# Patient Record
Sex: Male | Born: 1975
Health system: Southern US, Community
[De-identification: ages and names within clinical notes are randomized; demographics above are authoritative.]

## PROBLEM LIST (undated history)

## (undated) ENCOUNTER — Ambulatory Visit: Payer: Self-pay | Source: Home / Self Care

## (undated) DIAGNOSIS — R079 Chest pain, unspecified: Secondary | ICD-10-CM

## (undated) DIAGNOSIS — E785 Hyperlipidemia, unspecified: Secondary | ICD-10-CM

## (undated) DIAGNOSIS — G629 Polyneuropathy, unspecified: Secondary | ICD-10-CM

## (undated) DIAGNOSIS — N471 Phimosis: Secondary | ICD-10-CM

## (undated) DIAGNOSIS — E119 Type 2 diabetes mellitus without complications: Secondary | ICD-10-CM

## (undated) DIAGNOSIS — N492 Inflammatory disorders of scrotum: Secondary | ICD-10-CM

## (undated) DIAGNOSIS — G4733 Obstructive sleep apnea (adult) (pediatric): Secondary | ICD-10-CM

## (undated) DIAGNOSIS — N489 Disorder of penis, unspecified: Secondary | ICD-10-CM

## (undated) DIAGNOSIS — I10 Essential (primary) hypertension: Secondary | ICD-10-CM

## (undated) HISTORY — PX: NO PAST SURGERIES: SHX2092

## (undated) HISTORY — DX: Essential (primary) hypertension: I10

## (undated) HISTORY — DX: Chest pain, unspecified: R07.9

## (undated) HISTORY — DX: Inflammatory disorders of scrotum: N49.2

---

## 2009-07-26 ENCOUNTER — Emergency Department (HOSPITAL_COMMUNITY): Admission: EM | Admit: 2009-07-26 | Discharge: 2009-07-26 | Payer: Self-pay | Admitting: Emergency Medicine

## 2010-04-13 LAB — CBC
HCT: 44.7 % (ref 39.0–52.0)
MCH: 30.7 pg (ref 26.0–34.0)
MCV: 89.1 fL (ref 78.0–100.0)
RDW: 13 % (ref 11.5–15.5)

## 2010-04-13 LAB — POCT I-STAT, CHEM 8
BUN: 15 mg/dL (ref 6–23)
Calcium, Ion: 1.15 mmol/L (ref 1.12–1.32)
Chloride: 101 mEq/L (ref 96–112)
Creatinine, Ser: 0.9 mg/dL (ref 0.4–1.5)
Glucose, Bld: 289 mg/dL — ABNORMAL HIGH (ref 70–99)
HCT: 49 % (ref 39.0–52.0)
TCO2: 29 mmol/L (ref 0–100)

## 2010-04-13 LAB — DIFFERENTIAL
Basophils Relative: 1 % (ref 0–1)
Neutrophils Relative %: 69 % (ref 43–77)

## 2013-09-12 ENCOUNTER — Emergency Department (HOSPITAL_COMMUNITY): Payer: Self-pay

## 2013-09-12 ENCOUNTER — Emergency Department (HOSPITAL_COMMUNITY)
Admission: EM | Admit: 2013-09-12 | Discharge: 2013-09-12 | Disposition: A | Discharge status: Home / Self Care | Payer: Self-pay | Attending: Emergency Medicine | Admitting: Emergency Medicine

## 2013-09-12 ENCOUNTER — Other Ambulatory Visit: Payer: Self-pay | Admitting: Nurse Practitioner

## 2013-09-12 ENCOUNTER — Encounter (HOSPITAL_COMMUNITY): Payer: Self-pay | Admitting: Emergency Medicine

## 2013-09-12 DIAGNOSIS — E119 Type 2 diabetes mellitus without complications: Secondary | ICD-10-CM | POA: Insufficient documentation

## 2013-09-12 DIAGNOSIS — R0789 Other chest pain: Secondary | ICD-10-CM

## 2013-09-12 DIAGNOSIS — R739 Hyperglycemia, unspecified: Secondary | ICD-10-CM

## 2013-09-12 DIAGNOSIS — R110 Nausea: Secondary | ICD-10-CM | POA: Insufficient documentation

## 2013-09-12 DIAGNOSIS — R079 Chest pain, unspecified: Secondary | ICD-10-CM | POA: Insufficient documentation

## 2013-09-12 DIAGNOSIS — F17200 Nicotine dependence, unspecified, uncomplicated: Secondary | ICD-10-CM | POA: Insufficient documentation

## 2013-09-12 LAB — BASIC METABOLIC PANEL
ANION GAP: 14 (ref 5–15)
BUN: 12 mg/dL (ref 6–23)
CALCIUM: 9.3 mg/dL (ref 8.4–10.5)
CHLORIDE: 97 meq/L (ref 96–112)
CO2: 24 meq/L (ref 19–32)
CREATININE: 0.63 mg/dL (ref 0.50–1.35)
GFR calc Af Amer: >90 mL/min (ref >90)
Glucose, Bld: 403 mg/dL — ABNORMAL HIGH (ref 70–99)
Potassium: 4.3 mEq/L (ref 3.7–5.3)
Sodium: 135 mEq/L — ABNORMAL LOW (ref 137–147)

## 2013-09-12 LAB — CBC
HCT: 44.7 % (ref 39.0–52.0)
Hemoglobin: 16.2 g/dL (ref 13.0–17.0)
MCH: 30.9 pg (ref 26.0–34.0)
MCHC: 36.2 g/dL — ABNORMAL HIGH (ref 30.0–36.0)
MCV: 85.3 fL (ref 78.0–100.0)
Platelets: 236 10*3/uL (ref 150–400)
RBC: 5.24 MIL/uL (ref 4.22–5.81)
RDW: 12.5 % (ref 11.5–15.5)
WBC: 12.5 10*3/uL — ABNORMAL HIGH (ref 4.0–10.5)

## 2013-09-12 LAB — I-STAT TROPONIN, ED: Troponin i, poc: 0 ng/mL (ref 0.00–0.08)

## 2013-09-12 LAB — CBG MONITORING, ED: GLUCOSE-CAPILLARY: 251 mg/dL — AB (ref 70–99)

## 2013-09-12 MED ORDER — SODIUM CHLORIDE 0.9 % IV BOLUS (SEPSIS)
1000.0000 mL | Freq: Once | INTRAVENOUS | Status: AC
  Administered 2013-09-12: 1000 mL via INTRAVENOUS

## 2013-09-12 NOTE — ED Provider Notes (Signed)
CSN: [id number]     Arrival date & time 09/12/13  1616 History   First MD Initiated Contact with Patient 09/12/13 1906     Chief Complaint  Patient presents with  . Chest Pain     (Consider location/radiation/quality/duration/timing/severity/associated sxs/prior Treatment) Patient is a 38 y.o. male presenting with chest pain. The history is provided by the patient and the spouse. No language interpreter was used.  Chest Pain Pain location:  L chest Pain quality: aching   Pain radiates to:  Mid back and L shoulder Pain radiates to the back: yes   Pain severity:  Moderate Onset quality:  Sudden Timing:  Intermittent Progression:  Waxing and waning Chronicity:  Recurrent (3 months, nearly daily) Context: at rest   Relieved by:  Rest Exacerbated by: anxiety. Ineffective treatments:  None tried Associated symptoms: nausea   Associated symptoms: no abdominal pain, no back pain, no cough, no diaphoresis, no dizziness, no dysphagia, no fatigue, no fever, no headache, no numbness, no shortness of breath, no syncope, not vomiting and no weakness   Risk factors: diabetes mellitus and male sex   Risk factors: no high cholesterol, no hypertension, not obese, no prior DVT/PE and no smoking     Past Medical History  Diagnosis Date  . Diabetes mellitus without complication    History reviewed. No pertinent past surgical history. No family history on file. History  Substance Use Topics  . Smoking status: Current Some Day Smoker  . Smokeless tobacco: Not on file  . Alcohol Use: No    Review of Systems  Constitutional: Negative for fever, diaphoresis, activity change, appetite change and fatigue.  HENT: Negative for congestion, facial swelling, rhinorrhea and trouble swallowing.   Eyes: Negative for photophobia and pain.  Respiratory: Negative for cough, chest tightness and shortness of breath.   Cardiovascular: Positive for chest pain. Negative for leg swelling and syncope.   Gastrointestinal: Positive for nausea. Negative for vomiting, abdominal pain, diarrhea and constipation.  Endocrine: Negative for polydipsia and polyuria.  Genitourinary: Negative for dysuria, urgency, decreased urine volume and difficulty urinating.  Musculoskeletal: Negative for back pain and gait problem.  Skin: Negative for color change, rash and wound.  Allergic/Immunologic: Negative for immunocompromised state.  Neurological: Negative for dizziness, facial asymmetry, speech difficulty, weakness, numbness and headaches.  Psychiatric/Behavioral: Negative for confusion, decreased concentration and agitation.      Allergies  Augmentin  Home Medications   Prior to Admission medications   Not on File   BP 122/77  Pulse 72  Temp(Src) 98.1 F (36.7 C) (Oral)  Resp 13  SpO2 97% Physical Exam  Constitutional: He is oriented to person, place, and time. He appears well-developed and well-nourished. No distress.  HENT:  Head: Normocephalic and atraumatic.  Mouth/Throat: No oropharyngeal exudate.  Eyes: Pupils are equal, round, and reactive to light.  Neck: Normal range of motion. Neck supple.  Cardiovascular: Normal rate, regular rhythm and normal heart sounds.  Exam reveals no gallop and no friction rub.   No murmur heard. Pulmonary/Chest: Effort normal and breath sounds normal. No respiratory distress. He has no wheezes. He has no rales.  Abdominal: Soft. Bowel sounds are normal. He exhibits no distension and no mass. There is no tenderness. There is no rebound and no guarding.  Musculoskeletal: Normal range of motion. He exhibits no edema and no tenderness.  Neurological: He is alert and oriented to person, place, and time.  Skin: Skin is warm and dry.  Psychiatric: He has a normal mood  and affect.    ED Course  Procedures (including critical care time) Labs Review Labs Reviewed  CBC - Abnormal; Notable for the following:    WBC 12.5 (*)    MCHC 36.2 (*)    All other  components within normal limits  BASIC METABOLIC PANEL - Abnormal; Notable for the following:    Sodium 135 (*)    Glucose, Bld 403 (*)    All other components within normal limits  CBG MONITORING, ED - Abnormal; Notable for the following:    Glucose-Capillary 251 (*)    All other components within normal limits  I-STAT TROPOININ, ED    Imaging Review Dg Chest 2 View  09/12/2013   CLINICAL DATA:  Left-sided chest pain.  EXAM: CHEST  2 VIEW  COMPARISON:  07/26/2009  FINDINGS: The heart size and mediastinal contours are within normal limits. Both lungs are clear. Accentuation of the thoracic kyphosis, stable. No effusions.  IMPRESSION: No acute abnormalities.   Electronically Signed   By: Rozetta Nunnery M.D.   On: 09/12/2013 17:39     EKG Interpretation   Date/Time:  Tuesday September 12 2013 16:20:27 EDT Ventricular Rate:  79 PR Interval:  172 QRS Duration: 92 QT Interval:  382 QTC Calculation: 438 R Axis:   27 Text Interpretation:  Normal sinus rhythm No significant change since last  tracing Confirmed by [place] ([id number]) on 09/12/2013 7:09:02 PM      MDM   Final diagnoses:  Atypical chest pain  Hyperglycemia    Pt is a 38 y.o. male with Pmhx as above who presents with intermittent CP for 3 months w/ radiation to L shoulder and back. No current CP. CP brought on by stress. Episodes last mins. Today, he had episode w/ assoc clammy skin. He has not taken DM meds for 3 years due to side effects. EKG w/o ischemic changes, CXR nml, trop neg. Glu 400, AG 14. Doubt ACS given symptoms occur at rest, lasting mins, though given sex, hx of uncontrolled DM, spoke w/ cards to schedule outpt stress testing. SW also consulted and pt will be set up for appt at [facility] later this week or early next week. Glu improved to 251 after 1L NS. Instructions given for resuming diabetic diet. I have stressed the importance of resuming control of his DM.         Ernestina Patches, MD 09/13/13 1155

## 2013-09-12 NOTE — Discharge Instructions (Signed)
Diabetes Mellitus and Food °It is important for you to manage your blood sugar (glucose) level. Your blood glucose level can be greatly affected by what you eat. Eating healthier foods in the appropriate amounts throughout the day at about the same time each day will help you control your blood glucose level. It can also help slow or prevent worsening of your diabetes mellitus. Healthy eating may even help you improve the level of your blood pressure and reach or maintain a healthy weight.  °HOW CAN FOOD AFFECT ME? °Carbohydrates °Carbohydrates affect your blood glucose level more than any other type of food. Your dietitian will help you determine how many carbohydrates to eat at each meal and teach you how to count carbohydrates. Counting carbohydrates is important to keep your blood glucose at a healthy level, especially if you are using insulin or taking certain medicines for diabetes mellitus. °Alcohol °Alcohol can cause sudden decreases in blood glucose (hypoglycemia), especially if you use insulin or take certain medicines for diabetes mellitus. Hypoglycemia can be a life-threatening condition. Symptoms of hypoglycemia (sleepiness, dizziness, and disorientation) are similar to symptoms of having too much alcohol.  °If your health care provider has given you approval to drink alcohol, do so in moderation and use the following guidelines: °· Women should not have more than one drink per day, and men should not have more than two drinks per day. One drink is equal to: °¨ 12 oz of beer. °¨ 5 oz of wine. °¨ 1½ oz of hard liquor. °· Do not drink on an empty stomach. °· Keep yourself hydrated. Have water, diet soda, or unsweetened iced tea. °· Regular soda, juice, and other mixers might contain a lot of carbohydrates and should be counted. °WHAT FOODS ARE NOT RECOMMENDED? °As you make food choices, it is important to remember that all foods are not the same. Some foods have fewer nutrients per serving than other  foods, even though they might have the same number of calories or carbohydrates. It is difficult to get your body what it needs when you eat foods with fewer nutrients. Examples of foods that you should avoid that are high in calories and carbohydrates but low in nutrients include: °· Trans fats (most processed foods list trans fats on the Nutrition Facts label). °· Regular soda. °· Juice. °· Candy. °· Sweets, such as cake, pie, doughnuts, and cookies. °· Fried foods. °WHAT FOODS CAN I EAT? °Have nutrient-rich foods, which will nourish your body and keep you healthy. The food you should eat also will depend on several factors, including: °· The calories you need. °· The medicines you take. °· Your weight. °· Your blood glucose level. °· Your blood pressure level. °· Your cholesterol level. °You also should eat a variety of foods, including: °· Protein, such as meat, poultry, fish, tofu, nuts, and seeds (lean animal proteins are best). °· Fruits. °· Vegetables. °· Dairy products, such as milk, cheese, and yogurt (low fat is best). °· Breads, grains, pasta, cereal, rice, and beans. °· Fats such as olive oil, trans fat-free margarine, canola oil, avocado, and olives. °DOES EVERYONE WITH DIABETES MELLITUS HAVE THE SAME MEAL PLAN? °Because every person with diabetes mellitus is different, there is not one meal plan that works for everyone. It is very important that you meet with a dietitian who will help you create a meal plan that is just right for you. °Document Released: 10/09/2004 Document Revised: 01/17/2013 Document Reviewed: 12/09/2012 °[employer]® Patient Information ©2015 [employer]. This   information is not intended to replace advice given to you by your health care provider. Make sure you discuss any questions you have with your health care provider.   Chest Pain (Nonspecific) It is often hard to give a specific diagnosis for the cause of chest pain. There is always a chance that your pain could be related  to something serious, such as a heart attack or a blood clot in the lungs. You need to follow up with your health care provider for further evaluation. CAUSES   Heartburn.  Pneumonia or bronchitis.  Anxiety or stress.  Inflammation around your heart (pericarditis) or lung (pleuritis or pleurisy).  A blood clot in the lung.  A collapsed lung (pneumothorax). It can develop suddenly on its own (spontaneous pneumothorax) or from trauma to the chest.  Shingles infection (herpes zoster virus). The chest wall is composed of bones, muscles, and cartilage. Any of these can be the source of the pain.  The bones can be bruised by injury.  The muscles or cartilage can be strained by coughing or overwork.  The cartilage can be affected by inflammation and become sore (costochondritis). DIAGNOSIS  Lab tests or other studies may be needed to find the cause of your pain. Your health care provider may have you take a test called an ambulatory electrocardiogram (ECG). An ECG records your heartbeat patterns over a 24-hour period. You may also have other tests, such as:  Transthoracic echocardiogram (TTE). During echocardiography, sound waves are used to evaluate how blood flows through your heart.  Transesophageal echocardiogram (TEE).  Cardiac monitoring. This allows your health care provider to monitor your heart rate and rhythm in real time.  Holter monitor. This is a portable device that records your heartbeat and can help diagnose heart arrhythmias. It allows your health care provider to track your heart activity for several days, if needed.  Stress tests by exercise or by giving medicine that makes the heart beat faster. TREATMENT   Treatment depends on what may be causing your chest pain. Treatment may include:  Acid blockers for heartburn.  Anti-inflammatory medicine.  Pain medicine for inflammatory conditions.  Antibiotics if an infection is present.  You may be advised to change  lifestyle habits. This includes stopping smoking and avoiding alcohol, caffeine, and chocolate.  You may be advised to keep your head raised (elevated) when sleeping. This reduces the chance of acid going backward from your stomach into your esophagus. Most of the time, nonspecific chest pain will improve within 2-3 days with rest and mild pain medicine.  HOME CARE INSTRUCTIONS   If antibiotics were prescribed, take them as directed. Finish them even if you start to feel better.  For the next few days, avoid physical activities that bring on chest pain. Continue physical activities as directed.  Do not use any tobacco products, including cigarettes, chewing tobacco, or electronic cigarettes.  Avoid drinking alcohol.  Only take medicine as directed by your health care provider.  Follow your health care provider's suggestions for further testing if your chest pain does not go away.  Keep any follow-up appointments you made. If you do not go to an appointment, you could develop lasting (chronic) problems with pain. If there is any problem keeping an appointment, call to reschedule. SEEK MEDICAL CARE IF:   Your chest pain does not go away, even after treatment.  You have a rash with blisters on your chest.  You have a fever. SEEK IMMEDIATE MEDICAL CARE IF:   You  have increased chest pain or pain that spreads to your arm, neck, jaw, back, or abdomen.  You have shortness of breath.  You have an increasing cough, or you cough up blood.  You have severe back or abdominal pain.  You feel nauseous or vomit.  You have severe weakness.  You faint.  You have chills. This is an emergency. Do not wait to see if the pain will go away. Get medical help at once. Call your local emergency services (911 in [place] Do not drive yourself to the hospital. MAKE SURE YOU:   Understand these instructions.  Will watch your condition.  Will get help right away if you are not doing well or get  worse. Document Released: 10/22/2004 Document Revised: 01/17/2013 Document Reviewed: 08/18/2007 [facility] Patient Information 2015 [place], [place]. This information is not intended to replace advice given to you by your health care provider. Make sure you discuss any questions you have with your health care provider.

## 2013-09-12 NOTE — ED Notes (Signed)
The pt is c/o lt upper chest pain for 2 weeks with  Some sob and nausea intermittent.  He came in today when he experienced chills.  He has a history of chest pain and was worked up 6 years ago for the same.  No cardiac involvement.  Alert no  Sob no distress

## 2013-09-12 NOTE — Progress Notes (Signed)
ED CM consulted by Dr. Docherty concerning establishing care for follow-up. Patient presented to [facility] ED with c/o with intermittent CP for 3 months w/ radiation to L shoulder and back. No current CP. CP brought on by stress. Episodes last mins. Today, he had episode w/ assoc clammy skin. He has not taken DM meds for 3 years due to side effects. EKG wnl, trop neg. Glu 400. Met with patient and wife at bedside. Patient reports not having a PCP or Insurance coverage. Discussed the importance  importance and benefits of f/u with PCP, and not utilizing the ED for primary care needs.  Offered assistance with scheduling appt with the [facility]. Pt verbalized understanding and is in agreement. Brochure given for the [facility] with address, phone number, and the services provided. Explained ED CM will contact Clinic by e-mail to schedule PCP appt. Verified current phone number in record.  Also discussed the  [employer] Eligibility Counselor that is  on site to assist with [employer] information and process. Patient verbalized understanding. Updated Dr. Docherty she is in agreement with disposition plan. ED CM will follow up with patient.    

## 2013-09-12 NOTE — ED Notes (Signed)
Pt reprots episodes of L cp radiating to back x 2 weeks. These episodes usually come on when he feels agitated. Today pain came on suddenly when he was laying down. sts he sometimes feeling tingling/cold sensation all over with episodes. He endorses sob and nausea as well.

## 2013-09-12 NOTE — ED Notes (Signed)
nss infused

## 2013-09-15 ENCOUNTER — Encounter: Payer: Self-pay | Admitting: Family Medicine

## 2013-09-15 ENCOUNTER — Ambulatory Visit: Payer: Self-pay | Attending: Family Medicine | Admitting: Family Medicine

## 2013-09-15 VITALS — BP 110/77 | HR 77 | Temp 99.2°F | Resp 16 | Ht 71.0 in | Wt 262.0 lb

## 2013-09-15 DIAGNOSIS — E1121 Type 2 diabetes mellitus with diabetic nephropathy: Secondary | ICD-10-CM | POA: Insufficient documentation

## 2013-09-15 DIAGNOSIS — E781 Pure hyperglyceridemia: Secondary | ICD-10-CM | POA: Insufficient documentation

## 2013-09-15 DIAGNOSIS — E782 Mixed hyperlipidemia: Secondary | ICD-10-CM

## 2013-09-15 DIAGNOSIS — IMO0002 Reserved for concepts with insufficient information to code with codable children: Secondary | ICD-10-CM

## 2013-09-15 DIAGNOSIS — R634 Abnormal weight loss: Secondary | ICD-10-CM | POA: Insufficient documentation

## 2013-09-15 DIAGNOSIS — Z6841 Body Mass Index (BMI) 40.0 and over, adult: Secondary | ICD-10-CM

## 2013-09-15 DIAGNOSIS — E1169 Type 2 diabetes mellitus with other specified complication: Secondary | ICD-10-CM

## 2013-09-15 DIAGNOSIS — E1142 Type 2 diabetes mellitus with diabetic polyneuropathy: Secondary | ICD-10-CM | POA: Insufficient documentation

## 2013-09-15 DIAGNOSIS — E1165 Type 2 diabetes mellitus with hyperglycemia: Secondary | ICD-10-CM

## 2013-09-15 DIAGNOSIS — R209 Unspecified disturbances of skin sensation: Secondary | ICD-10-CM | POA: Insufficient documentation

## 2013-09-15 DIAGNOSIS — E785 Hyperlipidemia, unspecified: Secondary | ICD-10-CM | POA: Insufficient documentation

## 2013-09-15 DIAGNOSIS — F17200 Nicotine dependence, unspecified, uncomplicated: Secondary | ICD-10-CM | POA: Insufficient documentation

## 2013-09-15 DIAGNOSIS — E1159 Type 2 diabetes mellitus with other circulatory complications: Secondary | ICD-10-CM | POA: Insufficient documentation

## 2013-09-15 DIAGNOSIS — E119 Type 2 diabetes mellitus without complications: Secondary | ICD-10-CM | POA: Insufficient documentation

## 2013-09-15 DIAGNOSIS — F17210 Nicotine dependence, cigarettes, uncomplicated: Secondary | ICD-10-CM | POA: Insufficient documentation

## 2013-09-15 DIAGNOSIS — E1149 Type 2 diabetes mellitus with other diabetic neurological complication: Secondary | ICD-10-CM | POA: Insufficient documentation

## 2013-09-15 DIAGNOSIS — IMO0001 Reserved for inherently not codable concepts without codable children: Secondary | ICD-10-CM

## 2013-09-15 LAB — GLUCOSE, POCT (MANUAL RESULT ENTRY): POC Glucose: 311 mg/dl — AB (ref 70–99)

## 2013-09-15 LAB — POCT GLYCOSYLATED HEMOGLOBIN (HGB A1C): Hemoglobin A1C: 13.6

## 2013-09-15 MED ORDER — GLIPIZIDE ER 5 MG PO TB24: 5.0000 mg | ORAL_TABLET | Freq: Every day | ORAL | Status: DC

## 2013-09-15 MED ORDER — GLUCOSE BLOOD VI STRP: 1.0000 | ORAL_STRIP | Freq: Three times a day (TID) | Status: DC

## 2013-09-15 MED ORDER — METFORMIN HCL ER 500 MG PO TB24: 500.0000 mg | ORAL_TABLET | Freq: Every day | ORAL | Status: DC

## 2013-09-15 MED ORDER — INSULIN ASPART 100 UNIT/ML ~~LOC~~ SOLN
5.0000 [IU] | Freq: Once | SUBCUTANEOUS | Status: AC
  Administered 2013-09-15: 5 [IU] via SUBCUTANEOUS

## 2013-09-15 MED ORDER — ACCU-CHEK NANO SMARTVIEW W/DEVICE KIT: 1.0000 | PACK | Status: DC | PRN

## 2013-09-15 MED ORDER — LISINOPRIL 10 MG PO TABS: 10.0000 mg | ORAL_TABLET | Freq: Every day | ORAL | Status: DC

## 2013-09-15 MED ORDER — ACCU-CHEK FASTCLIX LANCETS MISC: 1.0000 | Freq: Three times a day (TID) | Status: DC

## 2013-09-15 NOTE — Progress Notes (Signed)
Pt is here to establish care. Pt has a history of diabetes.

## 2013-09-15 NOTE — Assessment & Plan Note (Signed)
A: Patient smokes cigarettes a few days per week. He smokes marijuana daily. We discussed hospice and cigarettes can contribute to worsening of vascular disease in the setting of diabetes.   P: plans to this quit smoking cigarettes completely. He is willing to cut down on marijuana

## 2013-09-15 NOTE — Progress Notes (Signed)
   Subjective:    Patient ID: Shaun Cummings, male    DOB: 01/17/1976, 38 y.o.   MRN: [id number] CC: DM2 untreated  HPI 38 year old male presents to establish care and discuss his diabetes:  1. Type 2 diabetes: Patient with type 2 diabetes diagnosed 10 years ago. His previous acute with oral medications including Janumet, metformin, others he cannot remember. In the past his A1c was up to 14 he is able to get it down to 9 with with oral therapy. She's been off the medication for the past 2 years. He presented to the health system with chest pain, diaphoresis, tingling in his extremities. His workup was negative for ACS. His workup revealed significant hyperglycemia. He admits to weight loss over the past year along with intermittent blurred vision, tingling paresthesias in both feet right greater than left . He denies shortness of breath, headache, polydipsia, polyuria. Is a strong family history of diabetes. His father died of complications of diabetes he was on dialysis.  Social history: Patient smokes marijuana. He smokes occasional cigarettes. He has 2 young children. 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   Review of Systems As per history of present illness     Objective:   Physical Exam BP 110/77  Pulse 77  Temp(Src) 99.2 F (37.3 C) (Oral)  Resp 16  Ht 5\' 11"  (1.803 m)  Wt 262 lb (118.842 kg)  BMI 36.56 kg/m2  SpO2 95% General appearance: alert, cooperative and no distress Throat: lips, mucosa, and tongue normal; teeth and gums normal Lungs: clear to auscultation bilaterally Heart: regular rate and rhythm, S1, S2 normal, no murmur, click, rub or gallop Extremities: extremities normal, atraumatic, no cyanosis or edema  Lab Results  Component Value Date   HGBA1C 13.6 09/15/2013       Assessment & Plan:

## 2013-09-15 NOTE — Assessment & Plan Note (Addendum)
A: Patient with uncontrolled/untreated type 2 diabetes. He is young and very motivated to get his diabetes under control. He requests a trial of oral therapy. P: Patient treated with NovoLog 5 units while in the office for his blood sugar 311 Start metformin XR 500 mg once daily with plan to titrate up as tolerated. Of note patient was previously treated with metformin immediate release and had significant abdominal pain or diarrhea. Start glipizide 5 mg daily with plan to titrate up as tolerated. Patient prescribed meter, test is, lancets we discussed blood sugar goal. Patient given a wall to break down his sugars. Labs: TSH, CMP, lipid panel.  Patient started on lisinopril for prevention of diabetic nephropathy. Patient declined starting statin at this time and preferred to have his lipids checked first and statin therapy started his cholesterol levels are high.  Regarding patient's neuropathy right foot greater than left foot we discussed possible treatment options for now we'll focus on treating blood sugars. Patient is aware that treatment options include gabapentin and Lyrica if paresthesias persists/worsens. Followup in 2 weeks with nurse to review blood sugars. Followup in 4 weeks with me.

## 2013-09-15 NOTE — Patient Instructions (Signed)
Shaun Cummings,  Thank you for coming in today. It was a pleasure meeting you. I look forward to working with you to meet your wellness goals.  1. Log sugars: bring log in 2 weeks to review with nurse.  Diet Recommendations for Diabetes   Starchy (carb) foods include: Bread, rice, pasta, potatoes, corn, crackers, bagels, muffins, all baked goods.   Protein foods include: Meat, fish, poultry, eggs, dairy foods, and beans such as pinto and kidney beans (beans also provide carbohydrate).   1. Eat at least 3 meals and 1-2 snacks per day. Never go more than 4-5 hours while awake without eating.  2. Limit starchy foods to TWO per meal and ONE per snack. ONE portion of a starchy  food is equal to the following:   - ONE slice of bread (or its equivalent, such as half of a hamburger bun).   - 1/2 cup of a "scoopable" starchy food such as potatoes or rice.   - 1 OUNCE (28 grams) of starchy snack foods such as crackers or pretzels (look on label).   - 15 grams of carbohydrate as shown on food label.  3. Both lunch and dinner should include a protein food, a carb food, and vegetables.   - Obtain twice as many veg's as protein or carbohydrate foods for both lunch and dinner.   - Try to keep frozen veg's on hand for a quick vegetable serving.     - Fresh or frozen veg's are best.  4. Breakfast should always include protein.    See me in 4 weeks to review log.  I will be in touch with lab results. Please call with questions or concerns regarding your medications or if neuropathy is worsening and you would like to start therapy.  Dr. Adrian Blackwater

## 2013-09-16 LAB — COMPREHENSIVE METABOLIC PANEL
ALBUMIN: 4.4 g/dL (ref 3.5–5.2)
ALK PHOS: 80 U/L (ref 39–117)
ALT: 30 U/L (ref 0–53)
AST: 16 U/L (ref 0–37)
BILIRUBIN TOTAL: 0.4 mg/dL (ref 0.2–1.2)
BUN: 14 mg/dL (ref 6–23)
CALCIUM: 10.2 mg/dL (ref 8.4–10.5)
CHLORIDE: 98 meq/L (ref 96–112)
CO2: 29 meq/L (ref 19–32)
Creat: 0.91 mg/dL (ref 0.50–1.35)
GLUCOSE: 297 mg/dL — AB (ref 70–99)
POTASSIUM: 4.3 meq/L (ref 3.5–5.3)
SODIUM: 137 meq/L (ref 135–145)
Total Protein: 7.6 g/dL (ref 6.0–8.3)

## 2013-09-16 LAB — LIPID PANEL
CHOLESTEROL: 215 mg/dL — AB (ref 0–200)
HDL: 28 mg/dL — ABNORMAL LOW (ref >39)
TRIGLYCERIDES: 670 mg/dL — AB (ref <150)
Total CHOL/HDL Ratio: 7.7 Ratio

## 2013-09-16 LAB — TSH: TSH: 1.111 u[IU]/mL (ref 0.350–4.500)

## 2013-09-18 ENCOUNTER — Telehealth: Payer: Self-pay | Admitting: Family Medicine

## 2013-09-18 DIAGNOSIS — E669 Obesity, unspecified: Secondary | ICD-10-CM | POA: Insufficient documentation

## 2013-09-18 DIAGNOSIS — E1169 Type 2 diabetes mellitus with other specified complication: Secondary | ICD-10-CM | POA: Insufficient documentation

## 2013-09-18 DIAGNOSIS — E785 Hyperlipidemia, unspecified: Secondary | ICD-10-CM

## 2013-09-18 MED ORDER — ATORVASTATIN CALCIUM 20 MG PO TABS: 20.0000 mg | ORAL_TABLET | Freq: Every day | ORAL | Status: DC

## 2013-09-18 NOTE — Telephone Encounter (Signed)
Error

## 2013-09-18 NOTE — Assessment & Plan Note (Signed)
Plan to start statin in addition to oral hypoglycemics

## 2013-09-18 NOTE — Addendum Note (Signed)
Addended by: Boykin Nearing on: 09/18/2013 01:11 PM   Modules accepted: Orders

## 2013-09-20 ENCOUNTER — Telehealth: Payer: Self-pay | Admitting: *Deleted

## 2013-09-20 NOTE — Telephone Encounter (Signed)
Message copied by Joan Mayans on Wed Sep 20, 2013  9:11 AM ------      Message from: Boykin Nearing      Created: Mon Sep 18, 2013  1:12 PM       Please call patient,      Cholesterol is high 215, goal < 200,  TGs 670 ( goal < 200). Good cholesterol is low at 28 (goal > 40). Plan to start moderate intensity statin lipitor 20 mg daily.  Sent to on site pharmacy.       TSH normal.        ------

## 2013-09-20 NOTE — Telephone Encounter (Signed)
Left message on VM to return my call. 

## 2013-09-20 NOTE — Telephone Encounter (Signed)
I spoke to the pt and so did Dr. Adrian Blackwater and explained to him his lab results and why he needed Lipitor. Mediation is at our pharmacy.

## 2016-02-03 ENCOUNTER — Other Ambulatory Visit: Payer: Self-pay | Admitting: Urology

## 2016-02-05 ENCOUNTER — Encounter (HOSPITAL_BASED_OUTPATIENT_CLINIC_OR_DEPARTMENT_OTHER): Payer: Self-pay | Admitting: *Deleted

## 2016-02-05 NOTE — Progress Notes (Signed)
NPO AFTER MN WITH EXCEPTION CLEAR LIQUIDS UNTIL 0730 (NO CREAM/ MILD PRODUCTS).  ARRIVE AT 1200.  NEEDS ISTAT AND EKG.  SINCE PT NOT ON DIABETIC MEDS CURRENTING (DUE TO FINANCE'S)  ENCOURAGED AND ADVISED TO WATCH DIET CAREFULLY UNTIL SURGERY TO CBG WILL BE OK TO HAVE SURGERY.  (PT HAD STATED LAST VISIT TO DOCTOR 3 WKS AGO AND CBG WAS 200.)

## 2016-02-10 ENCOUNTER — Ambulatory Visit (HOSPITAL_BASED_OUTPATIENT_CLINIC_OR_DEPARTMENT_OTHER): Payer: Self-pay | Admitting: Certified Registered"

## 2016-02-10 ENCOUNTER — Ambulatory Visit (HOSPITAL_BASED_OUTPATIENT_CLINIC_OR_DEPARTMENT_OTHER)
Admission: RE | Admit: 2016-02-10 | Discharge: 2016-02-10 | Disposition: A | Discharge status: Home / Self Care | Payer: Self-pay | Source: Ambulatory Visit | Attending: Urology | Admitting: Urology

## 2016-02-10 ENCOUNTER — Encounter (HOSPITAL_BASED_OUTPATIENT_CLINIC_OR_DEPARTMENT_OTHER): Payer: Self-pay | Admitting: *Deleted

## 2016-02-10 ENCOUNTER — Encounter (HOSPITAL_BASED_OUTPATIENT_CLINIC_OR_DEPARTMENT_OTHER)
Admission: RE | Disposition: A | Discharge status: Home / Self Care | Payer: Self-pay | Source: Ambulatory Visit | Attending: Urology

## 2016-02-10 ENCOUNTER — Other Ambulatory Visit: Payer: Self-pay

## 2016-02-10 DIAGNOSIS — E785 Hyperlipidemia, unspecified: Secondary | ICD-10-CM | POA: Insufficient documentation

## 2016-02-10 DIAGNOSIS — N471 Phimosis: Secondary | ICD-10-CM | POA: Insufficient documentation

## 2016-02-10 DIAGNOSIS — Z833 Family history of diabetes mellitus: Secondary | ICD-10-CM | POA: Insufficient documentation

## 2016-02-10 DIAGNOSIS — Z880 Allergy status to penicillin: Secondary | ICD-10-CM | POA: Insufficient documentation

## 2016-02-10 DIAGNOSIS — E1142 Type 2 diabetes mellitus with diabetic polyneuropathy: Secondary | ICD-10-CM | POA: Insufficient documentation

## 2016-02-10 DIAGNOSIS — A630 Anogenital (venereal) warts: Secondary | ICD-10-CM | POA: Insufficient documentation

## 2016-02-10 DIAGNOSIS — G4733 Obstructive sleep apnea (adult) (pediatric): Secondary | ICD-10-CM | POA: Insufficient documentation

## 2016-02-10 DIAGNOSIS — N478 Other disorders of prepuce: Secondary | ICD-10-CM | POA: Insufficient documentation

## 2016-02-10 DIAGNOSIS — Z87891 Personal history of nicotine dependence: Secondary | ICD-10-CM | POA: Insufficient documentation

## 2016-02-10 HISTORY — DX: Hyperlipidemia, unspecified: E78.5

## 2016-02-10 HISTORY — DX: Type 2 diabetes mellitus without complications: E11.9

## 2016-02-10 HISTORY — DX: Polyneuropathy, unspecified: G62.9

## 2016-02-10 HISTORY — DX: Phimosis: N47.1

## 2016-02-10 HISTORY — DX: Disorder of penis, unspecified: N48.9

## 2016-02-10 HISTORY — DX: Obstructive sleep apnea (adult) (pediatric): G47.33

## 2016-02-10 HISTORY — PX: CIRCUMCISION: SHX1350

## 2016-02-10 LAB — POCT I-STAT, CHEM 8
BUN: 15 mg/dL (ref 6–20)
CALCIUM ION: 1.25 mmol/L (ref 1.15–1.40)
CHLORIDE: 100 mmol/L — AB (ref 101–111)
CREATININE: 0.7 mg/dL (ref 0.61–1.24)
GLUCOSE: 256 mg/dL — AB (ref 65–99)
HCT: 46 % (ref 39.0–52.0)
Hemoglobin: 15.6 g/dL (ref 13.0–17.0)
POTASSIUM: 4 mmol/L (ref 3.5–5.1)
Sodium: 138 mmol/L (ref 135–145)
TCO2: 25 mmol/L (ref 0–100)

## 2016-02-10 LAB — GLUCOSE, CAPILLARY: Glucose-Capillary: 220 mg/dL — ABNORMAL HIGH (ref 65–99)

## 2016-02-10 LAB — POCT HEMOGLOBIN-HEMACUE: Hemoglobin: 15.8 g/dL (ref 13.0–17.0)

## 2016-02-10 SURGERY — CIRCUMCISION, ADULT
Anesthesia: General | Site: Penis

## 2016-02-10 MED ORDER — SULFAMETHOXAZOLE-TRIMETHOPRIM 800-160 MG PO TABS: 1.0000 | ORAL_TABLET | Freq: Two times a day (BID) | ORAL | 0 refills | Status: DC

## 2016-02-10 MED ORDER — LACTATED RINGERS IV SOLN
INTRAVENOUS | Status: DC
  Filled 2016-02-10: qty 1000

## 2016-02-10 MED ORDER — BACITRACIN 500 UNIT/GM EX OINT: 1.0000 "application " | TOPICAL_OINTMENT | Freq: Two times a day (BID) | CUTANEOUS | 0 refills | Status: DC

## 2016-02-10 MED ORDER — HYDROMORPHONE HCL 1 MG/ML IJ SOLN
0.2500 mg | INTRAMUSCULAR | Status: DC | PRN
  Filled 2016-02-10: qty 0.5

## 2016-02-10 MED ORDER — CEFAZOLIN IN D5W 1 GM/50ML IV SOLN
1.0000 g | INTRAVENOUS | Status: DC
  Filled 2016-02-10: qty 50

## 2016-02-10 MED ORDER — FENTANYL CITRATE (PF) 100 MCG/2ML IJ SOLN
INTRAMUSCULAR | Status: AC
  Filled 2016-02-10: qty 2

## 2016-02-10 MED ORDER — PROMETHAZINE HCL 25 MG/ML IJ SOLN
6.2500 mg | INTRAMUSCULAR | Status: DC | PRN
  Filled 2016-02-10: qty 1

## 2016-02-10 MED ORDER — EPHEDRINE SULFATE-NACL 50-0.9 MG/10ML-% IV SOSY
PREFILLED_SYRINGE | INTRAVENOUS | Status: DC | PRN
  Administered 2016-02-10: 10 mg via INTRAVENOUS
  Administered 2016-02-10: 5 mg via INTRAVENOUS

## 2016-02-10 MED ORDER — INSULIN ASPART 100 UNIT/ML ~~LOC~~ SOLN
SUBCUTANEOUS | Status: AC
  Filled 2016-02-10: qty 1

## 2016-02-10 MED ORDER — CEFAZOLIN SODIUM-DEXTROSE 2-4 GM/100ML-% IV SOLN
INTRAVENOUS | Status: AC
  Filled 2016-02-10: qty 100

## 2016-02-10 MED ORDER — OXYCODONE HCL 5 MG PO TABS
5.0000 mg | ORAL_TABLET | Freq: Once | ORAL | Status: DC | PRN
  Filled 2016-02-10: qty 1

## 2016-02-10 MED ORDER — INSULIN ASPART 100 UNIT/ML IV SOLN
10.0000 [IU] | Freq: Once | INTRAVENOUS | Status: AC
  Administered 2016-02-10: 3 [IU] via INTRAVENOUS
  Filled 2016-02-10: qty 0.1

## 2016-02-10 MED ORDER — BUPIVACAINE HCL 0.25 % IJ SOLN
INTRAMUSCULAR | Status: DC | PRN
  Administered 2016-02-10: 20 mL

## 2016-02-10 MED ORDER — LIDOCAINE 2% (20 MG/ML) 5 ML SYRINGE
INTRAMUSCULAR | Status: AC
  Filled 2016-02-10: qty 5

## 2016-02-10 MED ORDER — PROPOFOL 10 MG/ML IV BOLUS
INTRAVENOUS | Status: AC
  Filled 2016-02-10: qty 20

## 2016-02-10 MED ORDER — LIDOCAINE HCL (PF) 1 % IJ SOLN
INTRAMUSCULAR | Status: DC | PRN
  Administered 2016-02-10: 20 mL

## 2016-02-10 MED ORDER — OXYCODONE-ACETAMINOPHEN 5-325 MG PO TABS: 1.0000 | ORAL_TABLET | ORAL | 0 refills | Status: DC | PRN

## 2016-02-10 MED ORDER — OXYCODONE HCL 5 MG/5ML PO SOLN
5.0000 mg | Freq: Once | ORAL | Status: DC | PRN
  Filled 2016-02-10: qty 5

## 2016-02-10 MED ORDER — ONDANSETRON HCL 4 MG/2ML IJ SOLN
INTRAMUSCULAR | Status: DC | PRN
  Administered 2016-02-10: 4 mg via INTRAVENOUS

## 2016-02-10 MED ORDER — MIDAZOLAM HCL 5 MG/5ML IJ SOLN
INTRAMUSCULAR | Status: DC | PRN
  Administered 2016-02-10: 2 mg via INTRAVENOUS

## 2016-02-10 MED ORDER — LACTATED RINGERS IV SOLN
INTRAVENOUS | Status: DC
  Administered 2016-02-10 (×2): via INTRAVENOUS
  Filled 2016-02-10: qty 1000

## 2016-02-10 MED ORDER — CEFAZOLIN SODIUM-DEXTROSE 2-4 GM/100ML-% IV SOLN
2.0000 g | INTRAVENOUS | Status: AC
  Administered 2016-02-10: 2 g via INTRAVENOUS
  Filled 2016-02-10: qty 100

## 2016-02-10 MED ORDER — PROPOFOL 10 MG/ML IV BOLUS
INTRAVENOUS | Status: DC | PRN
Start: 2016-02-10 — End: 2016-02-10
  Administered 2016-02-10: 200 mg via INTRAVENOUS
  Administered 2016-02-10: 50 mg via INTRAVENOUS

## 2016-02-10 MED ORDER — ONDANSETRON HCL 4 MG/2ML IJ SOLN
INTRAMUSCULAR | Status: AC
  Filled 2016-02-10: qty 2

## 2016-02-10 MED ORDER — SODIUM CHLORIDE 0.9 % IR SOLN
Status: DC | PRN
  Administered 2016-02-10: 500 mL

## 2016-02-10 MED ORDER — FENTANYL CITRATE (PF) 100 MCG/2ML IJ SOLN
INTRAMUSCULAR | Status: DC | PRN
  Administered 2016-02-10 (×2): 25 ug via INTRAVENOUS
  Administered 2016-02-10 (×3): 50 ug via INTRAVENOUS

## 2016-02-10 MED ORDER — LIDOCAINE 2% (20 MG/ML) 5 ML SYRINGE
INTRAMUSCULAR | Status: DC | PRN
  Administered 2016-02-10: 100 mg via INTRAVENOUS

## 2016-02-10 MED ORDER — MIDAZOLAM HCL 2 MG/2ML IJ SOLN
INTRAMUSCULAR | Status: AC
  Filled 2016-02-10: qty 2

## 2016-02-10 MED ORDER — MEPERIDINE HCL 25 MG/ML IJ SOLN
6.2500 mg | INTRAMUSCULAR | Status: DC | PRN
  Filled 2016-02-10: qty 1

## 2016-02-10 SURGICAL SUPPLY — 36 items
BANDAGE CO FLEX L/F 1IN X 5YD (GAUZE/BANDAGES/DRESSINGS) ×2 IMPLANT
BANDAGE COBAN STERILE 2 (GAUZE/BANDAGES/DRESSINGS) ×3 IMPLANT
BLADE CLIPPER SENSICLIP SURGIC (BLADE) ×3 IMPLANT
BLADE SURG 15 STRL LF DISP TIS (BLADE) ×1 IMPLANT
BLADE SURG 15 STRL SS (BLADE) ×3
BNDG CONFORM 2 STRL LF (GAUZE/BANDAGES/DRESSINGS) ×3 IMPLANT
COVER BACK TABLE 60X90IN (DRAPES) ×3 IMPLANT
COVER MAYO STAND STRL (DRAPES) ×3 IMPLANT
DRAPE LAPAROTOMY 100X72 PEDS (DRAPES) ×3 IMPLANT
ELECT NDL BLADE 2-5/6 (NEEDLE) ×1 IMPLANT
ELECT NDL TIP 2.8 STRL (NEEDLE) ×1 IMPLANT
ELECT NEEDLE BLADE 2-5/6 (NEEDLE) ×3 IMPLANT
ELECT NEEDLE TIP 2.8 STRL (NEEDLE) ×3 IMPLANT
ELECT REM PT RETURN 9FT ADLT (ELECTROSURGICAL) ×3
ELECTRODE REM PT RTRN 9FT ADLT (ELECTROSURGICAL) ×1 IMPLANT
GAUZE SPONGE 4X4 16PLY XRAY LF (GAUZE/BANDAGES/DRESSINGS) ×3 IMPLANT
GAUZE XEROFORM 1X8 LF (GAUZE/BANDAGES/DRESSINGS) ×3 IMPLANT
GLOVE BIO SURGEON STRL SZ 6.5 (GLOVE) ×1 IMPLANT
GLOVE BIO SURGEON STRL SZ7 (GLOVE) ×2 IMPLANT
GLOVE BIO SURGEON STRL SZ8 (GLOVE) ×3 IMPLANT
GLOVE BIO SURGEONS STRL SZ 6.5 (GLOVE) ×1
GLOVE INDICATOR 6.5 STRL GRN (GLOVE) ×2 IMPLANT
GLOVE INDICATOR 7.5 STRL GRN (GLOVE) ×2 IMPLANT
GOWN STRL REUS W/ TWL XL LVL3 (GOWN DISPOSABLE) ×1 IMPLANT
GOWN STRL REUS W/TWL LRG LVL3 (GOWN DISPOSABLE) ×2 IMPLANT
GOWN STRL REUS W/TWL XL LVL3 (GOWN DISPOSABLE) ×2 IMPLANT
KIT ROOM TURNOVER WOR (KITS) ×3 IMPLANT
NDL HYPO 25X1 1.5 SAFETY (NEEDLE) ×1 IMPLANT
NEEDLE HYPO 25X1 1.5 SAFETY (NEEDLE) ×3 IMPLANT
NS IRRIG 500ML POUR BTL (IV SOLUTION) ×2 IMPLANT
PACK BASIN DAY SURGERY FS (CUSTOM PROCEDURE TRAY) ×3 IMPLANT
PENCIL BUTTON HOLSTER BLD 10FT (ELECTRODE) ×3 IMPLANT
SUT VICRYL 4-0 PS2 18IN ABS (SUTURE) ×6 IMPLANT
SYR CONTROL 10ML LL (SYRINGE) ×3 IMPLANT
TOWEL OR 17X26 10 PK STRL BLUE (TOWEL DISPOSABLE) ×3 IMPLANT
TRAY DSU PREP LF (CUSTOM PROCEDURE TRAY) ×3 IMPLANT

## 2016-02-10 NOTE — Anesthesia Procedure Notes (Signed)
Procedure Name: LMA Insertion Date/Time: 02/10/2016 2:04 PM Performed by: Denna Haggard D Pre-anesthesia Checklist: Patient identified, Emergency Drugs available, Suction available and Patient being monitored Patient Re-evaluated:Patient Re-evaluated prior to inductionOxygen Delivery Method: Circle system utilized Preoxygenation: Pre-oxygenation with 100% oxygen Intubation Type: IV induction Ventilation: Mask ventilation without difficulty LMA: LMA inserted LMA Size: 4.0 Number of attempts: 1 Airway Equipment and Method: Bite block Placement Confirmation: positive ETCO2 Tube secured with: Tape Dental Injury: Teeth and Oropharynx as per pre-operative assessment

## 2016-02-10 NOTE — H&P (Signed)
Urology Admission H&P  Chief Complaint: phimosis  History of Present Illness: Shaun Cummings is a 41yo with phimois and a newly diagnosed prepucial lesion  Past Medical History:  Diagnosis Date  . Hyperlipidemia   . Lesion of penis    foreskin  . OSA (obstructive sleep apnea)    per pt dx osa and used cpap but after losing wt. from 415 pounds down to 244 pounds no longer needs cpap  . Peripheral neuropathy ([place])   . Phimosis   . Type 2 diabetes mellitus (HCC)    per pt no meds for two years pt was changed to levemir unable to afford but pt states is waiting on approval for a program he applied for that will pay for his meds [facility])     Past Surgical History:  Procedure Laterality Date  . NO PAST SURGERIES      Home Medications:  No prescriptions prior to admission.   Allergies:  Allergies  Allergen Reactions  . Augmentin [Amoxicillin-Pot Clavulanate] Hives and Itching    Family History  Problem Relation Age of Onset  . Diabetes Mother   . Diabetes Father   . Diabetes Sister   . Diabetes Brother   . Diabetes Maternal Grandmother   . Diabetes Maternal Grandfather   . Diabetes Paternal Grandmother   . Diabetes Paternal Grandfather    Social History:  reports that he quit smoking about 8 years ago. His smoking use included Cigarettes. He quit after 2.00 years of use. He has never used smokeless tobacco. He reports that he does not drink alcohol or use drugs.  Review of Systems  All other systems reviewed and are negative.   Physical Exam:  Vital signs in last 24 hours: Temp:  [98.7 F (37.1 C)] 98.7 F (37.1 C) (01/15 1233) Pulse Rate:  [90] 90 (01/15 1233) Resp:  [16] 16 (01/15 1233) BP: (129)/(82) 129/82 (01/15 1233) SpO2:  [99 %] 99 % (01/15 1233) Weight:  [108.4 kg (239 lb)] 108.4 kg (239 lb) (01/15 1223) Physical Exam  Constitutional: He is oriented to person, place, and time. He appears well-developed and well-nourished.  HENT:  Head:  Normocephalic and atraumatic.  Eyes: EOM are normal. Pupils are equal, round, and reactive to light.  Neck: Normal range of motion. No thyromegaly present.  Cardiovascular: Normal rate and regular rhythm.   Respiratory: Effort normal. No respiratory distress.  GI: Soft. He exhibits no distension.  Musculoskeletal: Normal range of motion. He exhibits no edema.  Neurological: He is alert and oriented to person, place, and time.  Skin: Skin is warm and dry.  Psychiatric: He has a normal mood and affect. His behavior is normal. Judgment and thought content normal.    Laboratory Data:  No results found for this or any previous visit (from the past 24 hour(s)). No results found for this or any previous visit (from the past 240 hour(s)). Creatinine: No results for input(s): CREATININE in the last 168 hours. Baseline Creatinine: unknwon  Impression/Assessment:  41yo with phimosis and prepucial lesion  Plan:  The risks/benefits/alternatives to circumcision was explained to the patient and he understands and wishes to proceed with surgery  Shaun Cummings 02/10/2016, 1:08 PM

## 2016-02-10 NOTE — Discharge Instructions (Signed)
Circumcision, Adult, Care After These instructions give you information on caring for yourself after your procedure. Your doctor may also give you more specific instructions. Call your doctor if you have any problems or questions after your procedure. Follow these instructions at home:  Only take medicine as told by your doctor.  Any bandages (dressings) should stay on for at least 24 hours.  You may take the bandages off at night to let air get to the area where your doctor made a cut (incision site). Once a scab forms over the cut, you will not need to use bandages.  Carefully remove the bandage if it gets dirty. Apply medicated cream to the cut. Carefully put a new bandage on if a scab has not formed.  Do not have sex until your doctor says it is okay.  Do not get your cut wet for 24 hours or as told by your doctor.  You may take a sponge bath. Clean around the cut gently with mild soap and water.  You may take a shower after 24 hours or as told by your doctor. Do not take a tub bath. After you shower, gently pat your cut dry. Do not rub it.  Avoid heavy lifting.  Avoid contact sports, biking, or swimming until you have healed. This usually takes 10-14 days. Contact a doctor if:  You have pain that does not go away after you take medicine for it.  You have puffiness (swelling) or redness that is unexpected.  You have a fever. Get help right away if:  You cannot pee (urinate).  You have pain when you pee.  Your pain is not helped by medicines.  There is redness, puffiness, and soreness spreading up the shaft of your penis, your thighs, or your lower belly (abdomen).  There is yellowish-white fluid (pus) coming from your cut.  You have bleeding that does not stop when you press on it. This information is not intended to replace advice given to you by your health care provider. Make sure you discuss any questions you have with your health care provider. Document Released:  07/01/2007 Document Revised: 06/20/2015 Document Reviewed: 09/08/2012 Elsevier Interactive Patient Education  2017 [place] [facility] Care Instructions  Activity: Get plenty of rest for the remainder of the day. A responsible adult should stay with you for 24 hours following the procedure.  For the next 24 hours, DO NOT: -Drive a car -Paediatric nurse -Drink alcoholic beverages -Take any medication unless instructed by your physician -Make any legal decisions or sign important papers.  Meals: Start with liquid foods such as gelatin or soup. Progress to regular foods as tolerated. Avoid greasy, spicy, heavy foods. If nausea and/or vomiting occur, drink only clear liquids until the nausea and/or vomiting subsides. Call your physician if vomiting continues.  Special Instructions/Symptoms: Your throat may feel dry or sore from the anesthesia or the breathing tube placed in your throat during surgery. If this causes discomfort, gargle with warm salt water. The discomfort should disappear within 24 hours.  If you had a scopolamine patch placed behind your ear for the management of post- operative nausea and/or vomiting:  1. The medication in the patch is effective for 72 hours, after which it should be removed.  Wrap patch in a tissue and discard in the trash. Wash hands thoroughly with soap and water. 2. You may remove the patch earlier than 72 hours if you experience unpleasant side effects which may include dry mouth,  dizziness or visual disturbances. 3. Avoid touching the patch. Wash your hands with soap and water after contact with the patch.

## 2016-02-10 NOTE — Transfer of Care (Signed)
Immediate Anesthesia Transfer of Care Note  Patient: Shaun Cummings  Procedure(s) Performed: Procedure(s) (LRB): CIRCUMCISION ADULT (N/A)  Patient Location: PACU  Anesthesia Type: General  Level of Consciousness: awake, oriented, sedated and patient cooperative  Airway & Oxygen Therapy: Patient Spontanous Breathing and Patient connected to face mask oxygen  Post-op Assessment: Report given to PACU RN and Post -op Vital signs reviewed and stable  Post vital signs: Reviewed and stable  Complications: No apparent anesthesia complications  Last Vitals:  Vitals:   02/10/16 1233 02/10/16 1500  BP: 129/82 (P) 136/83  Pulse: 90 (P) 84  Resp: 16 (P) 17  Temp: 37.1 C (P) 36.3 C

## 2016-02-10 NOTE — Op Note (Signed)
Preoperative diagnosis: phimosis and prepucial lesion  Postoperative diagnosis: Same  Procedure: circumcision  Attending: Nicolette Bang, MD  Anesthesia: general  History of blood loss: 30cc  Antibiotics: ancef  Drains: none  Specimens: foreskin with lesion  Findings: dense phimotic ring. Papillary lesion on foreskin.  Indications: Patient is a 41 year old male with a history of phimosis, difficulty urinating, and recurrent balanoprothitis, and prepucial lesion.  After discussing treatment options he decided to proceed with circumcision   Procedure in detail: Prior to procedure consent was obtained.  Patient was brought to the operating room and a brief timeout was done to ensure correct patient, correct procedure, correct site.  General anesthesia was administered and patient was placed in supine position.  His genitalia and abdomen was then prepped and draped in usual sterile fashion.  The phimotic ring was incised sharply and the foreskin was then retracted. An circumferential incision was made 1.5cm below the coronal ridge. We then pulled the foreskin over the glans and made a separate circumferential incision at the level of the coronal ridge. We then sharply incised the foreskin and then freed it from the dartos using electrocautery. We then sent the foreskin for pathology. Individual bleeders were then cauterized and good hemostasis was noted. We then reapproximated the penile skin to the subcoronal incision. We used interrupted 3-0 vicryl to close the incision. Once this was complete we applied a gauze bandage and this then concluded the procedure which was well tolerated by the patient.  Complications: None  Condition: Stable, extubated, transferred to PACU.  Plan: Patient is to be discharged home.  He is to followup in 2 weeks for a wound check

## 2016-02-10 NOTE — Anesthesia Preprocedure Evaluation (Addendum)
Anesthesia Evaluation  Patient identified by MRN, date of birth, ID band Patient awake    Reviewed: Allergy & Precautions, NPO status , Patient's Chart, lab work & pertinent test results  Airway Mallampati: I  TM Distance: >3 FB Neck ROM: Full    Dental  (+) Teeth Intact, Dental Advisory Given   Pulmonary sleep apnea , former smoker,    breath sounds clear to auscultation       Cardiovascular negative cardio ROS   Rhythm:Regular Rate:Normal     Neuro/Psych  Neuromuscular disease negative psych ROS   GI/Hepatic negative GI ROS, Neg liver ROS,   Endo/Other  diabetes, Type 2  Renal/GU negative Renal ROS  negative genitourinary   Musculoskeletal negative musculoskeletal ROS (+)   Abdominal   Peds negative pediatric ROS (+)  Hematology   Anesthesia Other Findings   Reproductive/Obstetrics negative OB ROS                            Lab Results  Component Value Date   WBC 12.5 (H) 09/12/2013   HGB 15.8 02/10/2016   HCT 46.0 02/10/2016   MCV 85.3 09/12/2013   PLT 236 09/12/2013   Lab Results  Component Value Date   CREATININE 0.70 02/10/2016   BUN 15 02/10/2016   NA 138 02/10/2016   K 4.0 02/10/2016   CL 100 (L) 02/10/2016   CO2 29 09/15/2013   No results found for: INR, PROTIME  EKG: normal sinus rhythm.   Anesthesia Physical Anesthesia Plan  ASA: III  Anesthesia Plan: General   Post-op Pain Management:    Induction: Intravenous  Airway Management Planned: LMA  Additional Equipment:   Intra-op Plan: Utilization Of Total Body Hypothermia per surgeon request  Post-operative Plan: Extubation in OR  Informed Consent: I have reviewed the patients History and Physical, chart, labs and discussed the procedure including the risks, benefits and alternatives for the proposed anesthesia with the patient or authorized representative who has indicated his/her understanding and  acceptance.   Dental advisory given  Plan Discussed with: CRNA  Anesthesia Plan Comments: (Not well controlled Type 2 Diabetic)       Anesthesia Quick Evaluation

## 2016-02-10 NOTE — Anesthesia Postprocedure Evaluation (Addendum)
Anesthesia Post Note  Patient: Shaun Cummings  Procedure(s) Performed: Procedure(s) (LRB): CIRCUMCISION ADULT (N/A)  Patient location during evaluation: PACU Anesthesia Type: General Level of consciousness: awake and alert Pain management: pain level controlled Vital Signs Assessment: post-procedure vital signs reviewed and stable Respiratory status: spontaneous breathing, nonlabored ventilation, respiratory function stable and patient connected to nasal cannula oxygen Cardiovascular status: blood pressure returned to baseline and stable Postop Assessment: no signs of nausea or vomiting Anesthetic complications: no       Last Vitals:  Vitals:   02/10/16 1545 02/10/16 1600  BP: 108/68 118/64  Pulse: 77 76  Resp: 13 13  Temp:      Last Pain:  Vitals:   02/10/16 1233  TempSrc: Oral                 Effie Berkshire

## 2016-02-11 ENCOUNTER — Encounter (HOSPITAL_BASED_OUTPATIENT_CLINIC_OR_DEPARTMENT_OTHER): Payer: Self-pay | Admitting: Urology

## 2016-06-26 NOTE — Addendum Note (Signed)
Addendum  created 06/26/16 0855 by Effie Berkshire, MD   Sign clinical note

## 2017-05-08 ENCOUNTER — Emergency Department (HOSPITAL_COMMUNITY): Payer: Medicaid Other

## 2017-05-08 ENCOUNTER — Ambulatory Visit (HOSPITAL_COMMUNITY)
Admission: EM | Admit: 2017-05-08 | Discharge: 2017-05-08 | Disposition: A | Discharge status: Home / Self Care | Payer: Self-pay | Attending: Family Medicine | Admitting: Family Medicine

## 2017-05-08 ENCOUNTER — Encounter (HOSPITAL_COMMUNITY): Payer: Self-pay | Admitting: Emergency Medicine

## 2017-05-08 ENCOUNTER — Encounter (HOSPITAL_COMMUNITY): Payer: Self-pay

## 2017-05-08 ENCOUNTER — Other Ambulatory Visit: Payer: Self-pay

## 2017-05-08 ENCOUNTER — Inpatient Hospital Stay (HOSPITAL_COMMUNITY)
Admission: EM | Admit: 2017-05-08 | Discharge: 2017-05-14 | DRG: 853 | Disposition: A | Discharge status: Home / Self Care | Payer: Medicaid Other | Attending: Internal Medicine | Admitting: Internal Medicine

## 2017-05-08 DIAGNOSIS — R1909 Other intra-abdominal and pelvic swelling, mass and lump: Secondary | ICD-10-CM

## 2017-05-08 DIAGNOSIS — K651 Peritoneal abscess: Secondary | ICD-10-CM | POA: Diagnosis present

## 2017-05-08 DIAGNOSIS — E1142 Type 2 diabetes mellitus with diabetic polyneuropathy: Secondary | ICD-10-CM | POA: Diagnosis present

## 2017-05-08 DIAGNOSIS — G629 Polyneuropathy, unspecified: Secondary | ICD-10-CM | POA: Diagnosis present

## 2017-05-08 DIAGNOSIS — L03818 Cellulitis of other sites: Secondary | ICD-10-CM

## 2017-05-08 DIAGNOSIS — N492 Inflammatory disorders of scrotum: Secondary | ICD-10-CM | POA: Diagnosis present

## 2017-05-08 DIAGNOSIS — G709 Myoneural disorder, unspecified: Secondary | ICD-10-CM | POA: Diagnosis present

## 2017-05-08 DIAGNOSIS — E119 Type 2 diabetes mellitus without complications: Secondary | ICD-10-CM | POA: Diagnosis present

## 2017-05-08 DIAGNOSIS — R739 Hyperglycemia, unspecified: Secondary | ICD-10-CM

## 2017-05-08 DIAGNOSIS — Z87891 Personal history of nicotine dependence: Secondary | ICD-10-CM

## 2017-05-08 DIAGNOSIS — E1165 Type 2 diabetes mellitus with hyperglycemia: Secondary | ICD-10-CM

## 2017-05-08 DIAGNOSIS — A419 Sepsis, unspecified organism: Secondary | ICD-10-CM | POA: Diagnosis present

## 2017-05-08 DIAGNOSIS — Z881 Allergy status to other antibiotic agents status: Secondary | ICD-10-CM

## 2017-05-08 DIAGNOSIS — E876 Hypokalemia: Secondary | ICD-10-CM | POA: Clinically undetermined

## 2017-05-08 DIAGNOSIS — G4733 Obstructive sleep apnea (adult) (pediatric): Secondary | ICD-10-CM | POA: Diagnosis present

## 2017-05-08 DIAGNOSIS — L0390 Cellulitis, unspecified: Secondary | ICD-10-CM

## 2017-05-08 DIAGNOSIS — R262 Difficulty in walking, not elsewhere classified: Secondary | ICD-10-CM | POA: Diagnosis present

## 2017-05-08 DIAGNOSIS — N5089 Other specified disorders of the male genital organs: Secondary | ICD-10-CM | POA: Diagnosis present

## 2017-05-08 DIAGNOSIS — L03314 Cellulitis of groin: Secondary | ICD-10-CM | POA: Diagnosis present

## 2017-05-08 DIAGNOSIS — L02214 Cutaneous abscess of groin: Secondary | ICD-10-CM | POA: Diagnosis present

## 2017-05-08 DIAGNOSIS — L02215 Cutaneous abscess of perineum: Secondary | ICD-10-CM | POA: Diagnosis present

## 2017-05-08 DIAGNOSIS — R102 Pelvic and perineal pain: Secondary | ICD-10-CM | POA: Diagnosis present

## 2017-05-08 DIAGNOSIS — R609 Edema, unspecified: Secondary | ICD-10-CM | POA: Diagnosis present

## 2017-05-08 DIAGNOSIS — A401 Sepsis due to streptococcus, group B: Principal | ICD-10-CM | POA: Diagnosis present

## 2017-05-08 DIAGNOSIS — E1121 Type 2 diabetes mellitus with diabetic nephropathy: Secondary | ICD-10-CM | POA: Diagnosis present

## 2017-05-08 DIAGNOSIS — E669 Obesity, unspecified: Secondary | ICD-10-CM | POA: Diagnosis present

## 2017-05-08 DIAGNOSIS — N200 Calculus of kidney: Secondary | ICD-10-CM | POA: Diagnosis present

## 2017-05-08 DIAGNOSIS — Z6832 Body mass index (BMI) 32.0-32.9, adult: Secondary | ICD-10-CM

## 2017-05-08 HISTORY — DX: Inflammatory disorders of scrotum: N49.2

## 2017-05-08 LAB — CBC WITH DIFFERENTIAL/PLATELET
Basophils Absolute: 0.1 10*3/uL (ref 0.0–0.1)
Basophils Relative: 0 %
EOS PCT: 1 %
Eosinophils Absolute: 0.2 10*3/uL (ref 0.0–0.7)
HCT: 44.1 % (ref 39.0–52.0)
Hemoglobin: 15.7 g/dL (ref 13.0–17.0)
LYMPHS PCT: 17 %
Lymphs Abs: 3.2 10*3/uL (ref 0.7–4.0)
MCH: 30.3 pg (ref 26.0–34.0)
MCHC: 35.6 g/dL (ref 30.0–36.0)
MCV: 85 fL (ref 78.0–100.0)
Monocytes Absolute: 1.4 10*3/uL — ABNORMAL HIGH (ref 0.1–1.0)
Monocytes Relative: 8 %
Neutro Abs: 13.5 10*3/uL — ABNORMAL HIGH (ref 1.7–7.7)
Neutrophils Relative %: 74 %
PLATELETS: 338 10*3/uL (ref 150–400)
RBC: 5.19 MIL/uL (ref 4.22–5.81)
RDW: 12 % (ref 11.5–15.5)
WBC: 18.3 10*3/uL — ABNORMAL HIGH (ref 4.0–10.5)

## 2017-05-08 LAB — URINALYSIS, ROUTINE W REFLEX MICROSCOPIC
Bacteria, UA: NONE SEEN
Bilirubin Urine: NEGATIVE
Ketones, ur: 80 mg/dL — AB
Leukocytes, UA: NEGATIVE
Nitrite: NEGATIVE
PH: 5 (ref 5.0–8.0)
Protein, ur: NEGATIVE mg/dL
SPECIFIC GRAVITY, URINE: 1.03 (ref 1.005–1.030)
Squamous Epithelial / LPF: NONE SEEN

## 2017-05-08 LAB — BASIC METABOLIC PANEL
Anion gap: 14 (ref 5–15)
BUN: 13 mg/dL (ref 6–20)
CO2: 21 mmol/L — ABNORMAL LOW (ref 22–32)
Calcium: 9.5 mg/dL (ref 8.9–10.3)
Chloride: 98 mmol/L — ABNORMAL LOW (ref 101–111)
Creatinine, Ser: 0.84 mg/dL (ref 0.61–1.24)
GFR calc Af Amer: >60 mL/min (ref >60)
GFR calc non Af Amer: >60 mL/min (ref >60)
GLUCOSE: 241 mg/dL — AB (ref 65–99)
POTASSIUM: 3.8 mmol/L (ref 3.5–5.1)
Sodium: 133 mmol/L — ABNORMAL LOW (ref 135–145)

## 2017-05-08 LAB — HEPATIC FUNCTION PANEL
ALK PHOS: 108 U/L (ref 38–126)
ALT: 16 U/L — AB (ref 17–63)
AST: 14 U/L — ABNORMAL LOW (ref 15–41)
Albumin: 3.5 g/dL (ref 3.5–5.0)
Total Bilirubin: 1 mg/dL (ref 0.3–1.2)
Total Protein: 8.4 g/dL — ABNORMAL HIGH (ref 6.5–8.1)

## 2017-05-08 LAB — I-STAT CG4 LACTIC ACID, ED: Lactic Acid, Venous: 1.37 mmol/L (ref 0.5–1.9)

## 2017-05-08 LAB — GLUCOSE, CAPILLARY: Glucose-Capillary: 243 mg/dL — ABNORMAL HIGH (ref 65–99)

## 2017-05-08 LAB — CBG MONITORING, ED: GLUCOSE-CAPILLARY: 219 mg/dL — AB (ref 65–99)

## 2017-05-08 MED ORDER — PIPERACILLIN-TAZOBACTAM 3.375 G IVPB 30 MIN: 3.3750 g | Freq: Once | INTRAVENOUS | Status: DC

## 2017-05-08 MED ORDER — VANCOMYCIN HCL IN DEXTROSE 1-5 GM/200ML-% IV SOLN
1000.0000 mg | Freq: Three times a day (TID) | INTRAVENOUS | Status: DC
  Administered 2017-05-09 – 2017-05-10 (×5): 1000 mg via INTRAVENOUS
  Filled 2017-05-08 (×7): qty 200

## 2017-05-08 MED ORDER — SODIUM CHLORIDE 0.9 % IV BOLUS
1000.0000 mL | Freq: Once | INTRAVENOUS | Status: AC
  Administered 2017-05-08 (×2): 1000 mL via INTRAVENOUS

## 2017-05-08 MED ORDER — FENTANYL CITRATE (PF) 100 MCG/2ML IJ SOLN
50.0000 ug | Freq: Once | INTRAMUSCULAR | Status: AC
  Administered 2017-05-08: 50 ug via INTRAVENOUS
  Filled 2017-05-08: qty 2

## 2017-05-08 MED ORDER — HYDROMORPHONE HCL 1 MG/ML IJ SOLN
0.5000 mg | Freq: Once | INTRAMUSCULAR | Status: AC
  Administered 2017-05-08: 0.5 mg via INTRAVENOUS
  Filled 2017-05-08: qty 1

## 2017-05-08 MED ORDER — HYDROMORPHONE HCL 1 MG/ML IJ SOLN
1.0000 mg | INTRAMUSCULAR | Status: DC | PRN
  Administered 2017-05-09: 1 mg via INTRAVENOUS
  Filled 2017-05-08 (×2): qty 1

## 2017-05-08 MED ORDER — INSULIN ASPART 100 UNIT/ML ~~LOC~~ SOLN
0.0000 [IU] | Freq: Three times a day (TID) | SUBCUTANEOUS | Status: DC
  Administered 2017-05-09: 7 [IU] via SUBCUTANEOUS
  Administered 2017-05-09: 15 [IU] via SUBCUTANEOUS
  Administered 2017-05-10 (×3): 11 [IU] via SUBCUTANEOUS
  Administered 2017-05-11 (×3): 7 [IU] via SUBCUTANEOUS
  Administered 2017-05-12: 15 [IU] via SUBCUTANEOUS
  Administered 2017-05-12 – 2017-05-13 (×4): 7 [IU] via SUBCUTANEOUS
  Administered 2017-05-14 (×2): 3 [IU] via SUBCUTANEOUS

## 2017-05-08 MED ORDER — KCL IN DEXTROSE-NACL 20-5-0.45 MEQ/L-%-% IV SOLN
INTRAVENOUS | Status: DC
  Administered 2017-05-09 (×2): via INTRAVENOUS
  Filled 2017-05-08 (×2): qty 1000

## 2017-05-08 MED ORDER — HYDROMORPHONE HCL 2 MG/ML IJ SOLN: 1.0000 mg | INTRAMUSCULAR | Status: DC | PRN

## 2017-05-08 MED ORDER — FENTANYL CITRATE (PF) 100 MCG/2ML IJ SOLN
50.0000 ug | Freq: Once | INTRAMUSCULAR | Status: AC
  Administered 2017-05-09: 50 ug via INTRAVENOUS
  Administered 2017-05-09: 100 ug via INTRAVENOUS
  Filled 2017-05-08: qty 2

## 2017-05-08 MED ORDER — KETOROLAC TROMETHAMINE 30 MG/ML IJ SOLN
30.0000 mg | Freq: Once | INTRAMUSCULAR | Status: AC
  Administered 2017-05-08: 30 mg via INTRAVENOUS
  Filled 2017-05-08: qty 1

## 2017-05-08 MED ORDER — HEPARIN SODIUM (PORCINE) 5000 UNIT/ML IJ SOLN: 5000.0000 [IU] | Freq: Three times a day (TID) | INTRAMUSCULAR | Status: AC

## 2017-05-08 MED ORDER — KETOROLAC TROMETHAMINE 30 MG/ML IJ SOLN
30.0000 mg | Freq: Four times a day (QID) | INTRAMUSCULAR | Status: AC | PRN
  Administered 2017-05-09 – 2017-05-10 (×5): 30 mg via INTRAVENOUS
  Filled 2017-05-08 (×5): qty 1

## 2017-05-08 MED ORDER — HYDRALAZINE HCL 20 MG/ML IJ SOLN: 10.0000 mg | Freq: Three times a day (TID) | INTRAMUSCULAR | Status: DC | PRN

## 2017-05-08 MED ORDER — IOPAMIDOL (ISOVUE-300) INJECTION 61%
100.0000 mL | Freq: Once | INTRAVENOUS | Status: AC | PRN
  Administered 2017-05-08: 100 mL via INTRAVENOUS

## 2017-05-08 MED ORDER — IOPAMIDOL (ISOVUE-300) INJECTION 61%
INTRAVENOUS | Status: AC
  Filled 2017-05-08: qty 100

## 2017-05-08 MED ORDER — SODIUM CHLORIDE 0.9 % IV SOLN
2.0000 g | Freq: Once | INTRAVENOUS | Status: AC
  Administered 2017-05-08: 2 g via INTRAVENOUS
  Filled 2017-05-08: qty 2

## 2017-05-08 MED ORDER — VANCOMYCIN HCL 10 G IV SOLR
2000.0000 mg | Freq: Once | INTRAVENOUS | Status: AC
  Administered 2017-05-08: 2000 mg via INTRAVENOUS
  Filled 2017-05-08: qty 2000

## 2017-05-08 MED ORDER — HYDROMORPHONE HCL 2 MG/ML IJ SOLN
1.0000 mg | Freq: Once | INTRAMUSCULAR | Status: AC
Start: 2017-05-08 — End: 2017-05-08
  Administered 2017-05-08: 1 mg via INTRAVENOUS
  Filled 2017-05-08: qty 1

## 2017-05-08 MED ORDER — PIPERACILLIN-TAZOBACTAM 4.5 G IVPB: 4.5000 g | Freq: Once | INTRAVENOUS | Status: DC

## 2017-05-08 NOTE — H&P (Signed)
[facility] History and Physical  Shaun Cummings IOX:[id number] DOB: [birth year] DOA: 05/08/2017  Referring physician:  PCP: System, Pcp Not In   Chief Complaint: groin pain  HPI: Shaun Cummings is a 42 y.o. male with past medical history significant for phimosis status post circumcision, unmanaged diabetes presents to the emergency room with a chief complaint of groin pain.  Patient has had pain in the groin for the last 3 days.  Has some initial swelling below the scrotum.  Then later developed swelling above the penis.  Swelling continued to worsen redness became apparent.  Pain got to the point patient had difficulty ambulating.  At this point patient decided come to the emergency room for evaluation   ED course: EDP contacted urology who advised general surgery consult for drainage of abscess.  General surgery came to the bedside to evaluate the patient.  Plan for OR tomorrow.  Patient given vancomycin.  Hospitalist consulted for admission.  Review of Systems:  As per HPI otherwise 10 point review of systems negative.    Past Medical History:  Diagnosis Date  . Hyperlipidemia   . Lesion of penis    foreskin  . OSA (obstructive sleep apnea)    per pt dx osa and used cpap but after losing wt. from 415 pounds down to 244 pounds no longer needs cpap  . Peripheral neuropathy   . Phimosis   . Type 2 diabetes mellitus (HCC)    per pt no meds for two years pt was changed to levemir unable to afford but pt states is waiting on approval for a program he applied for that will pay for his meds [facility])     Past Surgical History:  Procedure Laterality Date  . CIRCUMCISION N/A 02/10/2016   Procedure: CIRCUMCISION ADULT;  Surgeon: Cleon Gustin, MD;  Location: [facility];  Service: Urology;  Laterality: N/A;  . NO PAST SURGERIES     Social History:  reports that he quit smoking about 9 years ago. His smoking use included cigarettes. He quit after 2.00  years of use. He has never used smokeless tobacco. He reports that he does not drink alcohol or use drugs.  Allergies  Allergen Reactions  . Augmentin [Amoxicillin-Pot Clavulanate] Hives and Itching    Has patient had a PCN reaction causing immediate rash, facial/tongue/throat swelling, SOB or lightheadedness with hypotension: Yes Has patient had a PCN reaction causing severe rash involving mucus membranes or skin necrosis: No Has patient had a PCN reaction that required hospitalization: No Has patient had a PCN reaction occurring within the last 10 years: No If all of the above answers are "NO", then may proceed with Cephalosporin use.    Family History  Problem Relation Age of Onset  . Diabetes Mother   . Diabetes Father   . Diabetes Sister   . Diabetes Brother   . Diabetes Maternal Grandmother   . Diabetes Maternal Grandfather   . Diabetes Paternal Grandmother   . Diabetes Paternal Grandfather      Prior to Admission medications   Medication Sig Start Date End Date Taking? Authorizing Provider  acetaminophen (TYLENOL) 325 MG tablet Take 325 mg by mouth every 6 (six) hours as needed for headache (pain).   Yes [provider]  bacitracin 500 UNIT/GM ointment Apply 1 application topically 2 (two) times daily. Patient not taking: Reported on 05/08/2017 02/10/16   Cleon Gustin, MD  oxyCODONE-acetaminophen (ROXICET) 5-325 MG tablet Take 1 tablet by mouth every  4 (four) hours as needed for severe pain. Patient not taking: Reported on 05/08/2017 02/10/16   Cleon Gustin, MD   Physical Exam: Vitals:   05/08/17 1800 05/08/17 1851 05/08/17 1900 05/08/17 1915  BP: (!) 141/90 138/85 (!) 145/95 (!) 151/84  Pulse:  95 98 92  Resp:      Temp:      TempSrc:      SpO2:  100% 97% 94%    Wt Readings from Last 3 Encounters:  05/08/17 108.9 kg (240 lb)  02/10/16 108.4 kg (239 lb)  09/15/13 118.8 kg (262 lb)    General:  Appears calm and comfortable;A&Ox3 Eyes:  PERRL,  EOMI, normal lids, iris ENT:  grossly normal hearing, lips & tongue Neck:  no LAD, masses or thyromegaly Cardiovascular:  RRR, no m/r/g. No LE edema.  Respiratory:  CTA bilaterally, no w/r/r. Normal respiratory effort. Abdomen:  soft, ntnd Skin: Normal skin on limited exam except for in the groin; on the right of the genital area there is erythema and swelling at at 10:00 in relationship to the base of the penis there is what appears to be a fluctuant abscess, there is a lot of swelling throughout the groin and entire scrotum has erythema with limited induration, the penis is not visible as it is hidden by the swelling, with pressure it can be visualized up to the corona of the penis head and there is smegma noted, the entire area is tender to palpation Musculoskeletal:  grossly normal tone BUE/BLE Psychiatric:  grossly normal mood and affect, speech fluent and appropriate Neurologic:  CN 2-12 grossly intact, moves all extremities in coordinated fashion.          Labs on Admission:  Basic Metabolic Panel: Recent Labs  Lab 05/08/17 1711  NA 133*  K 3.8  CL 98*  CO2 21*  GLUCOSE 241*  BUN 13  CREATININE 0.84  CALCIUM 9.5   Liver Function Tests: No results for input(s): AST, ALT, ALKPHOS, BILITOT, PROT, ALBUMIN in the last 168 hours. No results for input(s): LIPASE, AMYLASE in the last 168 hours. No results for input(s): AMMONIA in the last 168 hours. CBC: Recent Labs  Lab 05/08/17 1711  WBC 18.3*  NEUTROABS 13.5*  HGB 15.7  HCT 44.1  MCV 85.0  PLT 338   Cardiac Enzymes: No results for input(s): CKTOTAL, CKMB, CKMBINDEX, TROPONINI in the last 168 hours.  BNP (last 3 results) No results for input(s): BNP in the last 8760 hours.  ProBNP (last 3 results) No results for input(s): PROBNP in the last 8760 hours.   Creatinine clearance cannot be calculated (Unknown ideal weight.)  CBG: No results for input(s): GLUCAP in the last 168 hours.  Radiological Exams on  Admission: Ct Abdomen Pelvis W Contrast  Result Date: 05/08/2017 CLINICAL DATA:  Scrotal abscess, concern for necrotizing fasciitis EXAM: CT ABDOMEN AND PELVIS WITH CONTRAST TECHNIQUE: Multidetector CT imaging of the abdomen and pelvis was performed using the standard protocol following bolus administration of intravenous contrast. CONTRAST:  159mL ISOVUE-300 IOPAMIDOL (ISOVUE-300) INJECTION 61% COMPARISON:  Scrotal ultrasound dated 05/08/2017 FINDINGS: Lower chest: Lung bases are clear. Hepatobiliary: Liver is within normal limits. Gallbladder is unremarkable. No intrahepatic or extrahepatic ductal dilatation. Pancreas: Within normal limits. Spleen: Within normal limits. Adrenals/Urinary Tract: Adrenal glands are within normal limits. Two nonobstructing right renal calculi measuring up to 2 mm in the left upper kidney (series 3/images 36 and 42). Right kidney is within normal limits. No hydronephrosis. Bladder is within normal  limits. Stomach/Bowel: Stomach is within normal limits. No evidence of bowel obstruction. Normal appendix (series 3/image 72). Vascular/Lymphatic: No evidence of abdominal aortic aneurysm. No suspicious abdominopelvic lymphadenopathy. Right inguinal nodes measuring up to 1.7 cm short axis (series 3/image 101), likely reactive. Reproductive: Prostate is unremarkable. Scrotal wall thickening/edema, suggesting cellulitis. Associated 3.3 x 2.4 cm high right perineal/scrotal abscess (series 3/image 115). No soft tissue gas to suggest necrotizing fasciitis. Other: No abdominopelvic ascites. Musculoskeletal: Mild degenerative changes of the lower thoracic spine. IMPRESSION: 3.3 x 2.4 cm high right perineal/scrotal abscess. Associated scrotal wall thickening/edema, suggesting cellulitis. No soft tissue gas to suggest necrotizing fasciitis. Two nonobstructing left renal calculi measuring up to 2 mm. No hydronephrosis. Electronically Signed   By: Julian Hy M.D.   On: 05/08/2017 19:31   US  Scrotum W/doppler  Result Date: 05/08/2017 CLINICAL DATA:  Right-sided scrotal swelling for several days EXAM: SCROTAL ULTRASOUND DOPPLER ULTRASOUND OF THE TESTICLES TECHNIQUE: Complete ultrasound examination of the testicles, epididymis, and other scrotal structures was performed. Color and spectral Doppler ultrasound were also utilized to evaluate blood flow to the testicles. COMPARISON:  None. FINDINGS: Right testicle Measurements: 4.4 x 1.9 x 2.9 cm. No mass or microlithiasis visualized. Left testicle Measurements: 3.8 x 1.8 x 2.4 cm. No mass or microlithiasis visualized. Right epididymis: There is a cyst measuring 0.4 x 0.3 x 0.3 cm in the head of the epididymis on the right. No inflammatory focus. Left epididymis: Normal in size and appearance. No inflammatory focus. Hydrocele:  None visualized. Varicocele:  None visualized. Pulsed Doppler interrogation of both testes demonstrates normal low resistance arterial and venous waveforms bilaterally. There is diffuse skin and subcutaneous thickening throughout the right scrotal region. A lesser degree of thickening is noted on the left. No well-defined scrotal abscess by ultrasound. IMPRESSION: 1. Subcutaneous and skin thickening throughout the scrotum, more severe on the right than on the left, findings felt to represent cellulitis. No well-defined abscess appreciable. 2. Small cyst head of the epididymis on the right. No other mass lesion evident. 3. No orchitis or epididymitis. No testicular torsion on either side. Electronically Signed   By: Lowella Grip III M.D.   On: 05/08/2017 18:03    EKG: ordered  Assessment/Plan Active Problems:   Scrotal abscess  Abscess/Cellulitis  Continue vancomycin As needed Toradol for pain As needed Dilaudid for pain N.p.o. after midnight Surgery planned in the morning Pre-op ekg ordered Prn hydralazine for high bp  Hx of Sleep Apnea Cont pulse ox  Diabetes type 2 Sliding scale insulin before meals A1c  in the AM  Code Status: FC  DVT Prophylaxis: heparin/SCDs in AM Family Communication: pt says wife is unavailable  Disposition Plan: Pending Improvement  Status: inpt medsurg  Elwin Mocha, MD Family Medicine [facility] [email or web address] Password [email or web address]

## 2017-05-08 NOTE — Discharge Instructions (Addendum)
Please proceed directly to the Emergency Department for evaluation.

## 2017-05-08 NOTE — ED Notes (Signed)
Patient brought back for blood work, talking about hx of DM and states he has not been on medicines "for quite a while"

## 2017-05-08 NOTE — Progress Notes (Signed)
Pharmacy Antibiotic Note Costantino Kohlbeck is a 42 y.o. male admitted on 05/08/2017 with cellulitis. Pharmacy has been consulted for vancomycin dosing.  Plan: 1. Vancomycin 2000 mg x 1 followed by 1000 mg every 8 hours  2. SCr every 72 hours while on vancomycin    Temp (24hrs), Avg:99.1 F (37.3 C), Min:98.7 F (37.1 C), Max:99.4 F (37.4 C)  Recent Labs  Lab 05/08/17 1711 05/08/17 1723  WBC 18.3*  --   CREATININE 0.84  --   LATICACIDVEN  --  1.37    CrCl cannot be calculated (Unknown ideal weight.).    Allergies  Allergen Reactions  . Augmentin [Amoxicillin-Pot Clavulanate] Hives and Itching    Antimicrobials this admission: 4/13 vancomycin  >>   Microbiology results: px  Thank you for allowing pharmacy to be a part of this patient's care.  Vincenza Hews, PharmD, BCPS 05/08/2017, 6:53 PM

## 2017-05-08 NOTE — Consult Note (Signed)
Reason for Consult: Perineal abscess and pain Referring Physician: Roderic Palau MD  Shaun Cummings is an 42 y.o. male.  HPI: Asked to see the patient at the request of Dr. Roderic Palau for a 3-day history of right perineal pain, swelling and redness.  The patient has diabetes but is under no treatment.  He complains of redness, swelling and pain starting Wednesday originating his right perineum just above the scrotum.  The pain is severe made worse with movement or manipulation of the scrotum.  Pain location is above the scrotum and the perineum along the course of the spermatic cord.  CT showed a superficial abscess measuring 3 cm in this region.  He has diabetes but is under no treatment for it.  He denies fever or chills currently.  He complains of severe pain especially with manipulation of the area.  There is no drainage drainage.  He has noticed no redness.  He does not check his blood sugars.  He is under no treatment for diabetes.  Past Medical History:  Diagnosis Date  . Hyperlipidemia   . Lesion of penis    foreskin  . OSA (obstructive sleep apnea)    per pt dx osa and used cpap but after losing wt. from 415 pounds down to 244 pounds no longer needs cpap  . Peripheral neuropathy   . Phimosis   . Type 2 diabetes mellitus (HCC)    per pt no meds for two years pt was changed to levemir unable to afford but pt states is waiting on approval for a program he applied for that will pay for his meds [facility])      Past Surgical History:  Procedure Laterality Date  . CIRCUMCISION N/A 02/10/2016   Procedure: CIRCUMCISION ADULT;  Surgeon: Cleon Gustin, MD;  Location: [facility];  Service: Urology;  Laterality: N/A;  . NO PAST SURGERIES      Family History  Problem Relation Age of Onset  . Diabetes Mother   . Diabetes Father   . Diabetes Sister   . Diabetes Brother   . Diabetes Maternal Grandmother   . Diabetes Maternal Grandfather   . Diabetes Paternal Grandmother    . Diabetes Paternal Grandfather     Social History:  reports that he quit smoking about 9 years ago. His smoking use included cigarettes. He quit after 2.00 years of use. He has never used smokeless tobacco. He reports that he does not drink alcohol or use drugs.  Allergies:  Allergies  Allergen Reactions  . Augmentin [Amoxicillin-Pot Clavulanate] Hives and Itching    Has patient had a PCN reaction causing immediate rash, facial/tongue/throat swelling, SOB or lightheadedness with hypotension: Yes Has patient had a PCN reaction causing severe rash involving mucus membranes or skin necrosis: No Has patient had a PCN reaction that required hospitalization: No Has patient had a PCN reaction occurring within the last 10 years: No If all of the above answers are "NO", then may proceed with Cephalosporin use.    Medications: I have reviewed the patient's current medications.  Results for orders placed or performed during the hospital encounter of 05/08/17 (from the past 48 hour(s))  Basic metabolic panel     Status: Abnormal   Collection Time: 05/08/17  5:11 PM  Result Value Ref Range   Sodium 133 (L) 135 - 145 mmol/L   Potassium 3.8 3.5 - 5.1 mmol/L   Chloride 98 (L) 101 - 111 mmol/L   CO2 21 (L) 22 - 32  mmol/L   Glucose, Bld 241 (H) 65 - 99 mg/dL   BUN 13 6 - 20 mg/dL   Creatinine, Ser 0.84 0.61 - 1.24 mg/dL   Calcium 9.5 8.9 - 10.3 mg/dL   GFR calc non Af Amer >60 >60 mL/min   GFR calc Af Amer >60 >60 mL/min    Comment: (NOTE) The eGFR has been calculated using the CKD EPI equation. This calculation has not been validated in all clinical situations. eGFR's persistently <60 mL/min signify possible Chronic Kidney Disease.    Anion gap 14 5 - 15    Comment: Performed at [facility] [address], [place], [place] [address]  CBC with Differential     Status: Abnormal   Collection Time: 05/08/17  5:11 PM  Result Value Ref Range   WBC 18.3 (H) 4.0 - 10.5 K/uL   RBC 5.19  4.22 - 5.81 MIL/uL   Hemoglobin 15.7 13.0 - 17.0 g/dL   HCT 44.1 39.0 - 52.0 %   MCV 85.0 78.0 - 100.0 fL   MCH 30.3 26.0 - 34.0 pg   MCHC 35.6 30.0 - 36.0 g/dL   RDW 12.0 11.5 - 15.5 %   Platelets 338 150 - 400 K/uL   Neutrophils Relative % 74 %   Neutro Abs 13.5 (H) 1.7 - 7.7 K/uL   Lymphocytes Relative 17 %   Lymphs Abs 3.2 0.7 - 4.0 K/uL   Monocytes Relative 8 %   Monocytes Absolute 1.4 (H) 0.1 - 1.0 K/uL   Eosinophils Relative 1 %   Eosinophils Absolute 0.2 0.0 - 0.7 K/uL   Basophils Relative 0 %   Basophils Absolute 0.1 0.0 - 0.1 K/uL    Comment: Performed at [facility] [address] [place], [place] [address]  I-Stat CG4 Lactic Acid, ED     Status: None   Collection Time: 05/08/17  5:23 PM  Result Value Ref Range   Lactic Acid, Venous 1.37 0.5 - 1.9 mmol/L    Ct Abdomen Pelvis W Contrast  Result Date: 05/08/2017 CLINICAL DATA:  Scrotal abscess, concern for necrotizing fasciitis EXAM: CT ABDOMEN AND PELVIS WITH CONTRAST TECHNIQUE: Multidetector CT imaging of the abdomen and pelvis was performed using the standard protocol following bolus administration of intravenous contrast. CONTRAST:  168m ISOVUE-300 IOPAMIDOL (ISOVUE-300) INJECTION 61% COMPARISON:  Scrotal ultrasound dated 05/08/2017 FINDINGS: Lower chest: Lung bases are clear. Hepatobiliary: Liver is within normal limits. Gallbladder is unremarkable. No intrahepatic or extrahepatic ductal dilatation. Pancreas: Within normal limits. Spleen: Within normal limits. Adrenals/Urinary Tract: Adrenal glands are within normal limits. Two nonobstructing right renal calculi measuring up to 2 mm in the left upper kidney (series 3/images 36 and 42). Right kidney is within normal limits. No hydronephrosis. Bladder is within normal limits. Stomach/Bowel: Stomach is within normal limits. No evidence of bowel obstruction. Normal appendix (series 3/image 72). Vascular/Lymphatic: No evidence of abdominal aortic aneurysm. No suspicious  abdominopelvic lymphadenopathy. Right inguinal nodes measuring up to 1.7 cm short axis (series 3/image 101), likely reactive. Reproductive: Prostate is unremarkable. Scrotal wall thickening/edema, suggesting cellulitis. Associated 3.3 x 2.4 cm high right perineal/scrotal abscess (series 3/image 115). No soft tissue gas to suggest necrotizing fasciitis. Other: No abdominopelvic ascites. Musculoskeletal: Mild degenerative changes of the lower thoracic spine. IMPRESSION: 3.3 x 2.4 cm high right perineal/scrotal abscess. Associated scrotal wall thickening/edema, suggesting cellulitis. No soft tissue gas to suggest necrotizing fasciitis. Two nonobstructing left renal calculi measuring up to 2 mm. No hydronephrosis. Electronically Signed  By: Julian Hy M.D.   On: 05/08/2017 19:31   US Scrotum W/doppler  Result Date: 05/08/2017 CLINICAL DATA:  Right-sided scrotal swelling for several days EXAM: SCROTAL ULTRASOUND DOPPLER ULTRASOUND OF THE TESTICLES TECHNIQUE: Complete ultrasound examination of the testicles, epididymis, and other scrotal structures was performed. Color and spectral Doppler ultrasound were also utilized to evaluate blood flow to the testicles. COMPARISON:  None. FINDINGS: Right testicle Measurements: 4.4 x 1.9 x 2.9 cm. No mass or microlithiasis visualized. Left testicle Measurements: 3.8 x 1.8 x 2.4 cm. No mass or microlithiasis visualized. Right epididymis: There is a cyst measuring 0.4 x 0.3 x 0.3 cm in the head of the epididymis on the right. No inflammatory focus. Left epididymis: Normal in size and appearance. No inflammatory focus. Hydrocele:  None visualized. Varicocele:  None visualized. Pulsed Doppler interrogation of both testes demonstrates normal low resistance arterial and venous waveforms bilaterally. There is diffuse skin and subcutaneous thickening throughout the right scrotal region. A lesser degree of thickening is noted on the left. No well-defined scrotal abscess by  ultrasound. IMPRESSION: 1. Subcutaneous and skin thickening throughout the scrotum, more severe on the right than on the left, findings felt to represent cellulitis. No well-defined abscess appreciable. 2. Small cyst head of the epididymis on the right. No other mass lesion evident. 3. No orchitis or epididymitis. No testicular torsion on either side. Electronically Signed   By: Lowella Grip III M.D.   On: 05/08/2017 18:03    Review of Systems  Constitutional: Positive for malaise/fatigue. Negative for chills and fever.  HENT: Negative for hearing loss and tinnitus.   Eyes: Negative for blurred vision and double vision.  Respiratory: Negative for cough and hemoptysis.   Cardiovascular: Negative for chest pain and palpitations.  Gastrointestinal: Negative for heartburn and nausea.  Genitourinary: Positive for frequency. Negative for dysuria and urgency.  Musculoskeletal: Negative for myalgias and neck pain.  Skin: Negative for itching and rash.  Neurological: Negative for dizziness and headaches.  Endo/Heme/Allergies: Does not bruise/bleed easily.  Psychiatric/Behavioral: Negative for depression and suicidal ideas.   Blood pressure (!) 151/84, pulse 92, temperature 98.7 F (37.1 C), temperature source Oral, resp. rate 20, SpO2 94 %. Physical Exam  Constitutional: He appears well-developed and well-nourished.  HENT:  Head: Normocephalic and atraumatic.  Eyes: Pupils are equal, round, and reactive to light. EOM are normal.  Neck: Normal range of motion. Neck supple.  Cardiovascular: Normal rate.  Respiratory: Effort normal.  GI: Soft.  Genitourinary: Penis normal.  Genitourinary Comments: Fullness along the right spermatic cord below the inguinal ligament with some mild fluctuance.  No redness.  Tender to touch.  Area measures about 3 cm.  Musculoskeletal: Normal range of motion.  Neurological: He is alert.  Skin: Skin is warm and dry.     Psychiatric: He has a normal mood and  affect. His behavior is normal.    Assessment/Plan: Right peroneal abscess  Type 2 diabetes mellitus-untreated  Recommend medical admission to get medical conditions treated.  We will arrange for incision and drainage tomorrow morning in the operating room.  Discussed the rationale for drainage the patient.  Risk, benefits and other options discussed.  Antibiotics started.  He has no signs of necrotizing infection.  Patient voiced understanding of his condition and possible procedure for a.m.  N.p.o. after midnight  Damarco Keysor A Samual Beals 05/08/2017, 8:34 PM

## 2017-05-08 NOTE — ED Notes (Signed)
Estate manager/land agent called for patient in lobby without response.  This RN went out to try to find him, unable to locate.

## 2017-05-08 NOTE — ED Notes (Signed)
Patient back in lobby, was currently at [place]

## 2017-05-08 NOTE — ED Triage Notes (Signed)
Patient presents to eD for assessment of abscess to testicle since Wednesday.  Seen at [facility] and sent here.

## 2017-05-08 NOTE — ED Provider Notes (Signed)
[facility] DEPARTMENT Provider Note   CSN: [id number] Arrival date & time: 05/08/17  1624     History   Chief Complaint Chief Complaint  Patient presents with  . Abscess    HPI Shaun Cummings is a 42 y.o. male.   Abscess  Location:  Pelvis Pelvic abscess location:  Groin, scrotum and perineum Abscess quality: induration, painful, redness and warmth   Red streaking: yes   Duration:  3 days Progression:  Worsening Pain details:    Quality:  Dull   Severity:  Mild   Timing:  Constant   Progression:  Unchanged Chronicity:  New Context: diabetes   Relieved by:  Nothing Worsened by:  Nothing Associated symptoms: no anorexia, no fatigue, no fever, no headaches, no nausea and no vomiting     Past Medical History:  Diagnosis Date  . Hyperlipidemia   . Lesion of penis    foreskin  . OSA (obstructive sleep apnea)    per pt dx osa and used cpap but after losing wt. from 415 pounds down to 244 pounds no longer needs cpap  . Peripheral neuropathy   . Phimosis   . Type 2 diabetes mellitus (HCC)    per pt no meds for two years pt was changed to levemir unable to afford but pt states is waiting on approval for a program he applied for that will pay for his meds [facility])      Patient Active Problem List   Diagnosis Date Noted  . Dyslipidemia with low high density lipoprotein (HDL) cholesterol with hypertriglyceridemia due to type 2 diabetes mellitus ([place]) 09/18/2013  . Obesity (BMI 30-39.9) 09/18/2013  . Type II or unspecified type diabetes mellitus with peripheral circulatory disorders, uncontrolled(250.72) 09/15/2013  . Light smoker 09/15/2013    Past Surgical History:  Procedure Laterality Date  . CIRCUMCISION N/A 02/10/2016   Procedure: CIRCUMCISION ADULT;  Surgeon: Cleon Gustin, MD;  Location: [facility];  Service: Urology;  Laterality: N/A;  . NO PAST SURGERIES          Home Medications    Prior  to Admission medications   Medication Sig Start Date End Date Taking? Authorizing Provider  acetaminophen (TYLENOL) 325 MG tablet Take 325 mg by mouth every 6 (six) hours as needed for headache (pain).   Yes [provider]  bacitracin 500 UNIT/GM ointment Apply 1 application topically 2 (two) times daily. Patient not taking: Reported on 05/08/2017 02/10/16   Cleon Gustin, MD  oxyCODONE-acetaminophen (ROXICET) 5-325 MG tablet Take 1 tablet by mouth every 4 (four) hours as needed for severe pain. Patient not taking: Reported on 05/08/2017 02/10/16   Cleon Gustin, MD    Family History Family History  Problem Relation Age of Onset  . Diabetes Mother   . Diabetes Father   . Diabetes Sister   . Diabetes Brother   . Diabetes Maternal Grandmother   . Diabetes Maternal Grandfather   . Diabetes Paternal Grandmother   . Diabetes Paternal Grandfather     Social History Social History   Tobacco Use  . Smoking status: Former Smoker    Years: 2.00    Types: Cigarettes    Last attempt to quit: 02/05/2008    Years since quitting: 9.2  . Smokeless tobacco: Never Used  Substance Use Topics  . Alcohol use: No  . Drug use: No     Allergies   Augmentin [amoxicillin-pot clavulanate]   Review of Systems Review  of Systems  Constitutional: Negative for chills, fatigue and fever.  HENT: Negative for ear pain and sore throat.   Eyes: Negative for pain and visual disturbance.  Respiratory: Negative for cough and shortness of breath.   Cardiovascular: Negative for chest pain and palpitations.  Gastrointestinal: Negative for abdominal pain, anorexia, nausea and vomiting.  Genitourinary: Positive for difficulty urinating, scrotal swelling and testicular pain. Negative for dysuria and hematuria.  Musculoskeletal: Negative for arthralgias and back pain.  Skin: Negative for color change and rash.  Neurological: Negative for seizures, syncope and headaches.  All other systems  reviewed and are negative.    Physical Exam Updated Vital Signs  ED Triage Vitals  Enc Vitals Group     BP 05/08/17 1631 (!) 142/100     Pulse Rate 05/08/17 1631 (!) 115     Resp 05/08/17 1631 20     Temp 05/08/17 1631 98.7 F (37.1 C)     Temp Source 05/08/17 1631 Oral     SpO2 05/08/17 1631 98 %     Weight --      Height --      Head Circumference --      Peak Flow --      Pain Score 05/08/17 1630 9     Pain Loc --      Pain Edu? --      Excl. in [place]? --     Physical Exam  Constitutional: He appears well-developed and well-nourished. He appears distressed.  HENT:  Head: Normocephalic and atraumatic.  Eyes: Pupils are equal, round, and reactive to light. Conjunctivae are normal.  Neck: Normal range of motion. Neck supple.  Cardiovascular: Normal rate, regular rhythm and intact distal pulses.  No murmur heard. Pulmonary/Chest: Effort normal and breath sounds normal. No respiratory distress.  Abdominal: Soft. There is no tenderness.  Genitourinary:  Genitourinary Comments: Scrotal area edematous, erythematous with tenderness to palpation, no obvious crepitus, associated suprapubic lymphadenopathy but also possible area of abscess  Musculoskeletal: He exhibits no edema.  Neurological: He is alert.  Skin: Skin is warm and dry.  Psychiatric: He has a normal mood and affect.  Nursing note and vitals reviewed.    ED Treatments / Results  Labs (all labs ordered are listed, but only abnormal results are displayed) Labs Reviewed  BASIC METABOLIC PANEL - Abnormal; Notable for the following components:      Result Value   Sodium 133 (*)    Chloride 98 (*)    CO2 21 (*)    Glucose, Bld 241 (*)    All other components within normal limits  CBC WITH DIFFERENTIAL/PLATELET - Abnormal; Notable for the following components:   WBC 18.3 (*)    Neutro Abs 13.5 (*)    Monocytes Absolute 1.4 (*)    All other components within normal limits  CULTURE, BLOOD (ROUTINE X 2)    CULTURE, BLOOD (ROUTINE X 2)  URINE CULTURE  URINALYSIS, ROUTINE W REFLEX MICROSCOPIC  BASIC METABOLIC PANEL  I-STAT CG4 LACTIC ACID, ED    EKG None  Radiology Ct Abdomen Pelvis W Contrast  Result Date: 05/08/2017 CLINICAL DATA:  Scrotal abscess, concern for necrotizing fasciitis EXAM: CT ABDOMEN AND PELVIS WITH CONTRAST TECHNIQUE: Multidetector CT imaging of the abdomen and pelvis was performed using the standard protocol following bolus administration of intravenous contrast. CONTRAST:  144mL ISOVUE-300 IOPAMIDOL (ISOVUE-300) INJECTION 61% COMPARISON:  Scrotal ultrasound dated 05/08/2017 FINDINGS: Lower chest: Lung bases are clear. Hepatobiliary: Liver is within normal limits. Gallbladder is  unremarkable. No intrahepatic or extrahepatic ductal dilatation. Pancreas: Within normal limits. Spleen: Within normal limits. Adrenals/Urinary Tract: Adrenal glands are within normal limits. Two nonobstructing right renal calculi measuring up to 2 mm in the left upper kidney (series 3/images 36 and 42). Right kidney is within normal limits. No hydronephrosis. Bladder is within normal limits. Stomach/Bowel: Stomach is within normal limits. No evidence of bowel obstruction. Normal appendix (series 3/image 72). Vascular/Lymphatic: No evidence of abdominal aortic aneurysm. No suspicious abdominopelvic lymphadenopathy. Right inguinal nodes measuring up to 1.7 cm short axis (series 3/image 101), likely reactive. Reproductive: Prostate is unremarkable. Scrotal wall thickening/edema, suggesting cellulitis. Associated 3.3 x 2.4 cm high right perineal/scrotal abscess (series 3/image 115). No soft tissue gas to suggest necrotizing fasciitis. Other: No abdominopelvic ascites. Musculoskeletal: Mild degenerative changes of the lower thoracic spine. IMPRESSION: 3.3 x 2.4 cm high right perineal/scrotal abscess. Associated scrotal wall thickening/edema, suggesting cellulitis. No soft tissue gas to suggest necrotizing fasciitis.  Two nonobstructing left renal calculi measuring up to 2 mm. No hydronephrosis. Electronically Signed   By: Julian Hy M.D.   On: 05/08/2017 19:31   US Scrotum W/doppler  Result Date: 05/08/2017 CLINICAL DATA:  Right-sided scrotal swelling for several days EXAM: SCROTAL ULTRASOUND DOPPLER ULTRASOUND OF THE TESTICLES TECHNIQUE: Complete ultrasound examination of the testicles, epididymis, and other scrotal structures was performed. Color and spectral Doppler ultrasound were also utilized to evaluate blood flow to the testicles. COMPARISON:  None. FINDINGS: Right testicle Measurements: 4.4 x 1.9 x 2.9 cm. No mass or microlithiasis visualized. Left testicle Measurements: 3.8 x 1.8 x 2.4 cm. No mass or microlithiasis visualized. Right epididymis: There is a cyst measuring 0.4 x 0.3 x 0.3 cm in the head of the epididymis on the right. No inflammatory focus. Left epididymis: Normal in size and appearance. No inflammatory focus. Hydrocele:  None visualized. Varicocele:  None visualized. Pulsed Doppler interrogation of both testes demonstrates normal low resistance arterial and venous waveforms bilaterally. There is diffuse skin and subcutaneous thickening throughout the right scrotal region. A lesser degree of thickening is noted on the left. No well-defined scrotal abscess by ultrasound. IMPRESSION: 1. Subcutaneous and skin thickening throughout the scrotum, more severe on the right than on the left, findings felt to represent cellulitis. No well-defined abscess appreciable. 2. Small cyst head of the epididymis on the right. No other mass lesion evident. 3. No orchitis or epididymitis. No testicular torsion on either side. Electronically Signed   By: Lowella Grip III M.D.   On: 05/08/2017 18:03    Procedures Procedures (including critical care time)  Medications Ordered in ED Medications  vancomycin (VANCOCIN) 2,000 mg in sodium chloride 0.9 % 500 mL IVPB (2,000 mg Intravenous New Bag/Given 05/08/17  2022)  iopamidol (ISOVUE-300) 61 % injection (has no administration in time range)  vancomycin (VANCOCIN) IVPB 1000 mg/200 mL premix (has no administration in time range)  sodium chloride 0.9 % bolus 1,000 mL (0 mLs Intravenous Stopped 05/08/17 2022)  fentaNYL (SUBLIMAZE) injection 50 mcg (50 mcg Intravenous Given 05/08/17 1852)  ceFEPIme (MAXIPIME) 2 g in sodium chloride 0.9 % 100 mL IVPB (0 g Intravenous Stopped 05/08/17 2022)  iopamidol (ISOVUE-300) 61 % injection 100 mL (100 mLs Intravenous Contrast Given 05/08/17 1904)     Initial Impression / Assessment and Plan / ED Course  I have reviewed the triage vital signs and the nursing notes.  Pertinent labs & imaging results that were available during my care of the patient were reviewed by me and considered in  my medical decision making (see chart for details).     Shaun Cummings is a 42 year old male with history of diabetes who presents to the ED with concern for scrotal cellulitis, abscess.  Patient with normal vitals.  No fever.  Patient with pain and swelling in the scrotal area for the last 3 days.  Patient previously was on diabetic medication but not taking any now.  Patient with redness and swelling of the right side of his scrotum with swelling in the suprapubic area concerning for an abscess.  Patient overall otherwise well-appearing.  Patient had labs done prior to my evaluation that was significant for leukocytosis.  Patient had an ultrasound of the scrotum done that showed scrotal cellulitis.  However, given extension up into the suprapubic area will obtain a CT abdomen and pelvis to further evaluate for necrotizing fasciitis, abscess.  Patient empirically started on IV cefepime and IV vancomycin.  Patient given normal saline bolus and given fentanyl for pain.  Lactic acid normal.  No fever, no hypertension and no signs to suggest septic shock.  Patient was initially tachycardic and concern for sepsis.  Blood cultures collected.  CT scan  shows high perineal scrotal abscess.  Urology was consulted but they recommend general surgery given the location of abscess.  General surgery was consulted and they came down to the ED to evaluate the patient and will take to the OR tomorrow morning for washout.  Patient to be admitted to medicine for diabetic control and pain control.  Patient hemodynamically stable throughout my care.  Final Clinical Impressions(s) / ED Diagnoses   Final diagnoses:  Scrotal abscess  Sepsis, due to unspecified organism [facility])  Hyperglycemia  Cellulitis of other specified site    ED Discharge Orders    None       Lennice Sites, DO 05/08/17 2033    Elnora Morrison, MD 05/09/17 1517    Elnora Morrison, MD 05/26/17 [phone]

## 2017-05-08 NOTE — ED Triage Notes (Signed)
Pt presents with boil on scrotum since Wednesday.

## 2017-05-08 NOTE — ED Provider Notes (Signed)
Shaun Cummings   [id number] 05/08/17 Arrival Time: [place]:  1. Inguinal swelling    Worrisome for abscess. Given the extent of swelling and level of pain, Shaun Cummings sent to the ED for evaluation. Stable upon D/C.  Reviewed expectations re: course of current medical issues. Questions answered. Outlined signs and symptoms indicating need for more acute intervention. Patient verbalized understanding. After Visit Summary given.   SUBJECTIVE:  Shaun Cummings is a 42 y.o. male who presents with complaint of R inguinal/scrotal swelling. Onset gradual, a few days ago. Reports history of similar swelling "higher up that needed to be cut open" in the distant past. Now penis is retracted and he is finding it difficult to urinate. No abdominal pain/n/v. No h/o hernias reported. Pain level increasing daily. Symptoms are overall steadily worsening since beginning. Aggravating factors: standing or any movement. Alleviating factors: none. He denies fever. Appetite: normal. PO intake: normal. Ambulatory without assistance. OTC analgesics without relief.  Past Surgical History:  Procedure Laterality Date  . CIRCUMCISION N/A 02/10/2016   Procedure: CIRCUMCISION ADULT;  Surgeon: Cleon Gustin, MD;  Location: [facility];  Service: Urology;  Laterality: N/A;  . NO PAST SURGERIES     ROS: As per HPI.  OBJECTIVE:  Vitals:   05/08/17 1553  BP: (!) 154/91  Pulse: (!) 109  Resp: 18  Temp: 99.4 F (37.4 C)  SpO2: 99%  Weight: 240 lb (108.9 kg)   Tachycardia noted.   General appearance: alert; appears to be in pain while reclining on exam table Abdomen: soft; non-distended; no tenderness Significant inguinal swelling on the R extending into R scrotum; skin taught and very tender to touch making my exam very difficult; penis is retracted into/behind area of swelling; overall area is warm to touch Extremities: no edema; symmetrical with no gross  deformities Psychological: alert and cooperative; normal mood and affect  Allergies  Allergen Reactions  . Augmentin [Amoxicillin-Pot Clavulanate] Hives and Itching                                               Past Medical History:  Diagnosis Date  . Hyperlipidemia   . Lesion of penis    foreskin  . OSA (obstructive sleep apnea)    per pt dx osa and used cpap but after losing wt. from 415 pounds down to 244 pounds no longer needs cpap  . Peripheral neuropathy   . Phimosis   . Type 2 diabetes mellitus (HCC)    per pt no meds for two years pt was changed to levemir unable to afford but pt states is waiting on approval for a program he applied for that will pay for his meds [facility])     Social History   Socioeconomic History  . Marital status: Married    Spouse name: Not on file  . Number of children: 2  . Years of education: college   . Highest education level: Not on file  Occupational History  . Occupation: Group [employer]   Social Needs  . Financial resource strain: Not on file  . Food insecurity:    Worry: Not on file    Inability: Not on file  . Transportation needs:    Medical: Not on file    Non-medical: Not on file  Tobacco Use  . Smoking status: Former  Smoker    Years: 2.00    Types: Cigarettes    Last attempt to quit: 02/05/2008    Years since quitting: 9.2  . Smokeless tobacco: Never Used  Substance and Sexual Activity  . Alcohol use: No  . Drug use: No  . Sexual activity: Not on file  Lifestyle  . Physical activity:    Days per week: Not on file    Minutes per session: Not on file  . Stress: Not on file  Relationships  . Social connections:    Talks on phone: Not on file    Gets together: Not on file    Attends religious service: Not on file    Active member of club or organization: Not on file    Attends meetings of clubs or organizations: Not on file    Relationship status: Not on file  . Intimate partner violence:    Fear of current  or ex partner: Not on file    Emotionally abused: Not on file    Physically abused: Not on file    Forced sexual activity: Not on file  Other Topics Concern  . Not on file  Social History Narrative   Lives with wife and 2 kids, age 50 and 53.    Family History  Problem Relation Age of Onset  . Diabetes Mother   . Diabetes Father   . Diabetes Sister   . Diabetes Brother   . Diabetes Maternal Grandmother   . Diabetes Maternal Grandfather   . Diabetes Paternal Grandmother   . Diabetes Paternal Reymundo Poll, MD 05/08/17 [phone]

## 2017-05-08 NOTE — ED Notes (Signed)
Patient transported to CT 

## 2017-05-09 ENCOUNTER — Encounter (HOSPITAL_COMMUNITY)
Admission: EM | Disposition: A | Discharge status: Home / Self Care | Payer: Self-pay | Source: Home / Self Care | Attending: Internal Medicine

## 2017-05-09 ENCOUNTER — Encounter (HOSPITAL_COMMUNITY): Payer: Self-pay | Admitting: Certified Registered Nurse Anesthetist

## 2017-05-09 ENCOUNTER — Observation Stay (HOSPITAL_COMMUNITY): Payer: Medicaid Other | Admitting: Anesthesiology

## 2017-05-09 DIAGNOSIS — Z87891 Personal history of nicotine dependence: Secondary | ICD-10-CM | POA: Diagnosis not present

## 2017-05-09 DIAGNOSIS — L03314 Cellulitis of groin: Secondary | ICD-10-CM | POA: Diagnosis present

## 2017-05-09 DIAGNOSIS — R739 Hyperglycemia, unspecified: Secondary | ICD-10-CM | POA: Diagnosis not present

## 2017-05-09 DIAGNOSIS — E118 Type 2 diabetes mellitus with unspecified complications: Secondary | ICD-10-CM | POA: Diagnosis not present

## 2017-05-09 DIAGNOSIS — R102 Pelvic and perineal pain: Secondary | ICD-10-CM | POA: Diagnosis present

## 2017-05-09 DIAGNOSIS — G4733 Obstructive sleep apnea (adult) (pediatric): Secondary | ICD-10-CM | POA: Diagnosis present

## 2017-05-09 DIAGNOSIS — N200 Calculus of kidney: Secondary | ICD-10-CM | POA: Diagnosis present

## 2017-05-09 DIAGNOSIS — Z6832 Body mass index (BMI) 32.0-32.9, adult: Secondary | ICD-10-CM | POA: Diagnosis not present

## 2017-05-09 DIAGNOSIS — N454 Abscess of epididymis or testis: Secondary | ICD-10-CM | POA: Diagnosis not present

## 2017-05-09 DIAGNOSIS — L02214 Cutaneous abscess of groin: Secondary | ICD-10-CM | POA: Diagnosis present

## 2017-05-09 DIAGNOSIS — Z6841 Body Mass Index (BMI) 40.0 and over, adult: Secondary | ICD-10-CM | POA: Diagnosis not present

## 2017-05-09 DIAGNOSIS — E1142 Type 2 diabetes mellitus with diabetic polyneuropathy: Secondary | ICD-10-CM | POA: Diagnosis present

## 2017-05-09 DIAGNOSIS — A401 Sepsis due to streptococcus, group B: Secondary | ICD-10-CM | POA: Diagnosis present

## 2017-05-09 DIAGNOSIS — L02215 Cutaneous abscess of perineum: Secondary | ICD-10-CM | POA: Diagnosis present

## 2017-05-09 DIAGNOSIS — Z881 Allergy status to other antibiotic agents status: Secondary | ICD-10-CM | POA: Diagnosis not present

## 2017-05-09 DIAGNOSIS — Z978 Presence of other specified devices: Secondary | ICD-10-CM | POA: Diagnosis not present

## 2017-05-09 DIAGNOSIS — B951 Streptococcus, group B, as the cause of diseases classified elsewhere: Secondary | ICD-10-CM | POA: Diagnosis not present

## 2017-05-09 DIAGNOSIS — A419 Sepsis, unspecified organism: Secondary | ICD-10-CM | POA: Diagnosis present

## 2017-05-09 DIAGNOSIS — L03818 Cellulitis of other sites: Secondary | ICD-10-CM | POA: Diagnosis not present

## 2017-05-09 DIAGNOSIS — N5089 Other specified disorders of the male genital organs: Secondary | ICD-10-CM | POA: Diagnosis present

## 2017-05-09 DIAGNOSIS — N492 Inflammatory disorders of scrotum: Secondary | ICD-10-CM | POA: Diagnosis present

## 2017-05-09 DIAGNOSIS — R609 Edema, unspecified: Secondary | ICD-10-CM | POA: Diagnosis present

## 2017-05-09 DIAGNOSIS — E876 Hypokalemia: Secondary | ICD-10-CM | POA: Diagnosis not present

## 2017-05-09 DIAGNOSIS — K651 Peritoneal abscess: Secondary | ICD-10-CM | POA: Diagnosis present

## 2017-05-09 DIAGNOSIS — E669 Obesity, unspecified: Secondary | ICD-10-CM | POA: Diagnosis present

## 2017-05-09 DIAGNOSIS — Z833 Family history of diabetes mellitus: Secondary | ICD-10-CM | POA: Diagnosis not present

## 2017-05-09 DIAGNOSIS — G629 Polyneuropathy, unspecified: Secondary | ICD-10-CM | POA: Diagnosis present

## 2017-05-09 DIAGNOSIS — R262 Difficulty in walking, not elsewhere classified: Secondary | ICD-10-CM | POA: Diagnosis present

## 2017-05-09 DIAGNOSIS — E1165 Type 2 diabetes mellitus with hyperglycemia: Secondary | ICD-10-CM | POA: Diagnosis present

## 2017-05-09 DIAGNOSIS — G709 Myoneural disorder, unspecified: Secondary | ICD-10-CM | POA: Diagnosis present

## 2017-05-09 HISTORY — PX: INCISION AND DRAINAGE PERIRECTAL ABSCESS: SHX1804

## 2017-05-09 LAB — BASIC METABOLIC PANEL
ANION GAP: 10 (ref 5–15)
BUN: 9 mg/dL (ref 6–20)
CO2: 21 mmol/L — AB (ref 22–32)
Calcium: 8.3 mg/dL — ABNORMAL LOW (ref 8.9–10.3)
Chloride: 101 mmol/L (ref 101–111)
Creatinine, Ser: 0.88 mg/dL (ref 0.61–1.24)
GFR calc Af Amer: >60 mL/min (ref >60)
GFR calc non Af Amer: >60 mL/min (ref >60)
GLUCOSE: 292 mg/dL — AB (ref 65–99)
POTASSIUM: 4.2 mmol/L (ref 3.5–5.1)
Sodium: 132 mmol/L — ABNORMAL LOW (ref 135–145)

## 2017-05-09 LAB — GLUCOSE, CAPILLARY
GLUCOSE-CAPILLARY: 268 mg/dL — AB (ref 65–99)
GLUCOSE-CAPILLARY: 503 mg/dL — AB (ref 65–99)
Glucose-Capillary: 277 mg/dL — ABNORMAL HIGH (ref 65–99)
Glucose-Capillary: 219 mg/dL — ABNORMAL HIGH (ref 65–99)
Glucose-Capillary: 361 mg/dL — ABNORMAL HIGH (ref 65–99)

## 2017-05-09 LAB — SURGICAL PCR SCREEN
MRSA, PCR: NEGATIVE
Staphylococcus aureus: POSITIVE — AB

## 2017-05-09 LAB — HEMOGLOBIN A1C
Hgb A1c MFr Bld: 13.1 % — ABNORMAL HIGH (ref 4.8–5.6)
Mean Plasma Glucose: 329.27 mg/dL

## 2017-05-09 SURGERY — INCISION AND DRAINAGE, ABSCESS, PERIRECTAL
Anesthesia: General | Site: Buttocks | Laterality: Right

## 2017-05-09 MED ORDER — OXYCODONE HCL 5 MG PO TABS
5.0000 mg | ORAL_TABLET | ORAL | Status: DC | PRN
  Administered 2017-05-09: 5 mg via ORAL
  Filled 2017-05-09: qty 1

## 2017-05-09 MED ORDER — HYDROMORPHONE HCL 2 MG/ML IJ SOLN: 0.2500 mg | INTRAMUSCULAR | Status: DC | PRN

## 2017-05-09 MED ORDER — 0.9 % SODIUM CHLORIDE (POUR BTL) OPTIME
TOPICAL | Status: DC | PRN
  Administered 2017-05-09: 1000 mL

## 2017-05-09 MED ORDER — SODIUM CHLORIDE 0.9 % IV SOLN
1.0000 g | Freq: Three times a day (TID) | INTRAVENOUS | Status: DC
  Administered 2017-05-09 – 2017-05-10 (×3): 1 g via INTRAVENOUS
  Filled 2017-05-09 (×4): qty 1

## 2017-05-09 MED ORDER — BUPIVACAINE HCL (PF) 0.25 % IJ SOLN
INTRAMUSCULAR | Status: DC | PRN
  Administered 2017-05-09: 30 mL

## 2017-05-09 MED ORDER — SUGAMMADEX SODIUM 200 MG/2ML IV SOLN
INTRAVENOUS | Status: DC | PRN
  Administered 2017-05-09: 250 mg via INTRAVENOUS

## 2017-05-09 MED ORDER — BUPIVACAINE HCL (PF) 0.25 % IJ SOLN
INTRAMUSCULAR | Status: AC
  Filled 2017-05-09: qty 30

## 2017-05-09 MED ORDER — SUCCINYLCHOLINE CHLORIDE 20 MG/ML IJ SOLN
INTRAMUSCULAR | Status: DC | PRN
  Administered 2017-05-09: 100 mg via INTRAVENOUS

## 2017-05-09 MED ORDER — FENTANYL CITRATE (PF) 250 MCG/5ML IJ SOLN
INTRAMUSCULAR | Status: AC
  Filled 2017-05-09: qty 5

## 2017-05-09 MED ORDER — PROPOFOL 10 MG/ML IV BOLUS
INTRAVENOUS | Status: AC
  Filled 2017-05-09: qty 20

## 2017-05-09 MED ORDER — LIDOCAINE HCL (CARDIAC) 20 MG/ML IV SOLN
INTRAVENOUS | Status: DC | PRN
  Administered 2017-05-09: 100 mg via INTRAVENOUS

## 2017-05-09 MED ORDER — PROPOFOL 10 MG/ML IV BOLUS
INTRAVENOUS | Status: DC | PRN
  Administered 2017-05-09: 200 mg via INTRAVENOUS

## 2017-05-09 MED ORDER — ACETAMINOPHEN 500 MG PO TABS
1000.0000 mg | ORAL_TABLET | ORAL | Status: AC
  Administered 2017-05-09: 1000 mg via ORAL
  Filled 2017-05-09: qty 2

## 2017-05-09 MED ORDER — MIDAZOLAM HCL 2 MG/2ML IJ SOLN
INTRAMUSCULAR | Status: AC
  Filled 2017-05-09: qty 2

## 2017-05-09 MED ORDER — MEPERIDINE HCL 50 MG/ML IJ SOLN: 6.2500 mg | INTRAMUSCULAR | Status: DC | PRN

## 2017-05-09 MED ORDER — CHLORHEXIDINE GLUCONATE CLOTH 2 % EX PADS
6.0000 | MEDICATED_PAD | Freq: Once | CUTANEOUS | Status: AC
  Administered 2017-05-09: 6 via TOPICAL

## 2017-05-09 MED ORDER — OXYCODONE HCL 5 MG PO TABS: 5.0000 mg | ORAL_TABLET | Freq: Once | ORAL | Status: DC | PRN

## 2017-05-09 MED ORDER — ROCURONIUM BROMIDE 100 MG/10ML IV SOLN
INTRAVENOUS | Status: DC | PRN
  Administered 2017-05-09: 30 mg via INTRAVENOUS

## 2017-05-09 MED ORDER — ONDANSETRON HCL 4 MG/2ML IJ SOLN
INTRAMUSCULAR | Status: DC | PRN
  Administered 2017-05-09: 4 mg via INTRAVENOUS

## 2017-05-09 MED ORDER — CLINDAMYCIN PHOSPHATE 900 MG/50ML IV SOLN: 900.0000 mg | INTRAVENOUS | Status: DC

## 2017-05-09 MED ORDER — INSULIN ASPART 100 UNIT/ML ~~LOC~~ SOLN
11.0000 [IU] | Freq: Once | SUBCUTANEOUS | Status: AC
  Administered 2017-05-09: 11 [IU] via SUBCUTANEOUS

## 2017-05-09 MED ORDER — LACTATED RINGERS IV SOLN
INTRAVENOUS | Status: DC | PRN
  Administered 2017-05-09: 08:00:00 via INTRAVENOUS

## 2017-05-09 MED ORDER — OXYCODONE HCL 5 MG PO TABS
5.0000 mg | ORAL_TABLET | ORAL | Status: DC | PRN
  Administered 2017-05-09 – 2017-05-10 (×4): 5 mg via ORAL
  Filled 2017-05-09 (×4): qty 1

## 2017-05-09 MED ORDER — LACTATED RINGERS IV SOLN
INTRAVENOUS | Status: DC
  Administered 2017-05-09 – 2017-05-12 (×3): via INTRAVENOUS

## 2017-05-09 MED ORDER — GABAPENTIN 300 MG PO CAPS: 300.0000 mg | ORAL_CAPSULE | ORAL | Status: DC

## 2017-05-09 MED ORDER — OXYCODONE HCL 5 MG PO TABS
5.0000 mg | ORAL_TABLET | Freq: Four times a day (QID) | ORAL | Status: DC | PRN
  Administered 2017-05-09: 5 mg via ORAL

## 2017-05-09 MED ORDER — MIDAZOLAM HCL 5 MG/5ML IJ SOLN
INTRAMUSCULAR | Status: DC | PRN
  Administered 2017-05-09: 2 mg via INTRAVENOUS

## 2017-05-09 MED ORDER — DEXAMETHASONE SODIUM PHOSPHATE 4 MG/ML IJ SOLN
INTRAMUSCULAR | Status: DC | PRN
  Administered 2017-05-09: 8 mg via INTRAVENOUS

## 2017-05-09 MED ORDER — CELECOXIB 200 MG PO CAPS: 200.0000 mg | ORAL_CAPSULE | ORAL | Status: DC

## 2017-05-09 MED ORDER — INSULIN ASPART 100 UNIT/ML ~~LOC~~ SOLN
SUBCUTANEOUS | Status: AC
  Administered 2017-05-09: 11 [IU] via SUBCUTANEOUS
  Filled 2017-05-09: qty 1

## 2017-05-09 MED ORDER — CHLORHEXIDINE GLUCONATE CLOTH 2 % EX PADS
6.0000 | MEDICATED_PAD | Freq: Once | CUTANEOUS | Status: AC
  Administered 2017-05-08: 6 via TOPICAL

## 2017-05-09 MED ORDER — ACETAMINOPHEN 325 MG PO TABS
650.0000 mg | ORAL_TABLET | Freq: Four times a day (QID) | ORAL | Status: DC | PRN
  Administered 2017-05-10 – 2017-05-14 (×5): 650 mg via ORAL
  Filled 2017-05-09 (×5): qty 2

## 2017-05-09 MED ORDER — OXYCODONE HCL 5 MG/5ML PO SOLN: 5.0000 mg | Freq: Once | ORAL | Status: DC | PRN

## 2017-05-09 MED ORDER — PROMETHAZINE HCL 25 MG/ML IJ SOLN: 6.2500 mg | INTRAMUSCULAR | Status: DC | PRN

## 2017-05-09 SURGICAL SUPPLY — 32 items
BNDG GAUZE ELAST 4 BULKY (GAUZE/BANDAGES/DRESSINGS) IMPLANT
COVER MAYO STAND STRL (DRAPES) ×3 IMPLANT
COVER SURGICAL LIGHT HANDLE (MISCELLANEOUS) ×3 IMPLANT
DRAIN PENROSE 1/2X12 LTX STRL (WOUND CARE) ×2 IMPLANT
DRSG PAD ABDOMINAL 8X10 ST (GAUZE/BANDAGES/DRESSINGS) ×2 IMPLANT
ELECT CAUTERY BLADE 6.4 (BLADE) ×3 IMPLANT
ELECT REM PT RETURN 9FT ADLT (ELECTROSURGICAL) ×3
ELECTRODE REM PT RTRN 9FT ADLT (ELECTROSURGICAL) ×1 IMPLANT
GAUZE SPONGE 4X4 12PLY STRL (GAUZE/BANDAGES/DRESSINGS) ×2 IMPLANT
GLOVE BIO SURGEON STRL SZ7 (GLOVE) ×3 IMPLANT
GLOVE BIOGEL PI IND STRL 7.5 (GLOVE) ×1 IMPLANT
GLOVE BIOGEL PI INDICATOR 7.5 (GLOVE) ×2
GOWN STRL REUS W/ TWL LRG LVL3 (GOWN DISPOSABLE) ×2 IMPLANT
GOWN STRL REUS W/TWL LRG LVL3 (GOWN DISPOSABLE) ×6
KIT BASIN OR (CUSTOM PROCEDURE TRAY) ×3 IMPLANT
KIT TURNOVER KIT B (KITS) ×3 IMPLANT
NDL HYPO 25GX1X1/2 BEV (NEEDLE) IMPLANT
NEEDLE HYPO 25GX1X1/2 BEV (NEEDLE) ×3 IMPLANT
NS IRRIG 1000ML POUR BTL (IV SOLUTION) ×3 IMPLANT
PACK LITHOTOMY IV (CUSTOM PROCEDURE TRAY) ×3 IMPLANT
PAD ARMBOARD 7.5X6 YLW CONV (MISCELLANEOUS) ×3 IMPLANT
PENCIL BUTTON HOLSTER BLD 10FT (ELECTRODE) ×3 IMPLANT
SPONGE LAP 18X18 X RAY DECT (DISPOSABLE) ×3 IMPLANT
SURGILUBE 2OZ TUBE FLIPTOP (MISCELLANEOUS) ×3 IMPLANT
SUT ETHILON 2 0 FS 18 (SUTURE) ×2 IMPLANT
SYR BULB 3OZ (MISCELLANEOUS) ×3 IMPLANT
SYR CONTROL 10ML LL (SYRINGE) ×2 IMPLANT
TOWEL OR 17X24 6PK STRL BLUE (TOWEL DISPOSABLE) ×3 IMPLANT
TOWEL OR 17X26 10 PK STRL BLUE (TOWEL DISPOSABLE) ×3 IMPLANT
TUBE CONNECTING 12'X1/4 (SUCTIONS) ×1
TUBE CONNECTING 12X1/4 (SUCTIONS) ×2 IMPLANT
YANKAUER SUCT BULB TIP NO VENT (SUCTIONS) ×3 IMPLANT

## 2017-05-09 NOTE — Progress Notes (Signed)
Patient seen in holding.   Tender right perineal/ scrotal/ groin abscess  Will proceed with I&D under anesthesia.  Imogene Burn. Georgette Dover, MD, [facility] Surgery  General/ Trauma Surgery  05/09/2017 7:39 AM

## 2017-05-09 NOTE — Transfer of Care (Signed)
Immediate Anesthesia Transfer of Care Note  Patient: Shaun Cummings  Procedure(s) Performed: IRRIGATION AND DEBRIDEMENT PERINEAL ABSCESS (Right Buttocks)  Patient Location: PACU  Anesthesia Type:General  Level of Consciousness: awake, oriented, drowsy and patient cooperative  Airway & Oxygen Therapy: Patient Spontanous Breathing and Patient connected to nasal cannula oxygen  Post-op Assessment: Report given to RN and Post -op Vital signs reviewed and stable  Post vital signs: Reviewed and stable  Last Vitals:  Vitals Value Taken Time  BP 115/70 05/09/2017  8:48 AM  Temp    Pulse 89 05/09/2017  8:49 AM  Resp 31 05/09/2017  8:49 AM  SpO2 92 % 05/09/2017  8:49 AM  Vitals shown include unvalidated device data.  Last Pain:  Vitals:   05/09/17 0622  TempSrc: Oral  PainSc:       Patients Stated Pain Goal: 0 (40/08/67 6195)  Complications: No apparent anesthesia complications

## 2017-05-09 NOTE — Anesthesia Procedure Notes (Signed)
Procedure Name: Intubation Date/Time: 05/09/2017 7:56 AM Performed by: Oletta Lamas, CRNA Pre-anesthesia Checklist: Patient identified, Emergency Drugs available, Suction available and Patient being monitored Patient Re-evaluated:Patient Re-evaluated prior to induction Oxygen Delivery Method: Circle System Utilized Preoxygenation: Pre-oxygenation with 100% oxygen Induction Type: IV induction Ventilation: Mask ventilation without difficulty Laryngoscope Size: Miller and 3 Grade View: Grade I Tube type: Oral Tube size: 7.5 mm Number of attempts: 1 Airway Equipment and Method: Stylet Placement Confirmation: ETT inserted through vocal cords under direct vision,  positive ETCO2 and breath sounds checked- equal and bilateral Secured at: 23 cm Tube secured with: Tape Dental Injury: Teeth and Oropharynx as per pre-operative assessment

## 2017-05-09 NOTE — Op Note (Signed)
Preop diagnosis: Right groin/perineal/ scrotal abscess Postop diagnosis: Same Procedure performed: Sharp incision and drainage of right groin/perineal/scrotal abscess Surgeon:  Maia Petties Anesthesia: General Indications: This is a 42 year old male who is an untreated diabetic who presents with several days of progressive worsening right groin/scrotal/perineal pain.  CT scan showed a 3 cm abscess in this area.  His diabetes is untreated.  He was admitted by medicine and they have managed his blood sugars.  He presents now for incision and drainage.  Description of procedure: The patient is brought to the operating room and placed in the supine position on the operating room table.  After an adequate level of general anesthesia was obtained, his right leg was placed in yellowfin stirrups in gentle lithotomy position.  This exposed his right groin.  There is a firm palpable mass in the suprapubic fat just to the right of midline.  Erythema and induration run down the right side of the scrotum along the spermatic cord.  We prepped the lower abdomen and his entire groin with Betadine and draped in sterile fashion.  A timeout was taken to ensure the proper patient and proper procedure.  We infiltrated the area over the firm palpable mass with 0.25% Marcaine with epinephrine.  I used a scalpel to make a 3 cm incision over this area.  We dissected down through the subcutaneous tissue until we encountered the abscess.  I open the abscess and we cultured the purulent fluid that was encountered.  This was sent for microbiology.  We suctioned out all of the purulent fluid.  I explored the wound.  The seems to track down along the spermatic cord to the right hemiscrotum for a distance of about 8 cm.  We irrigated this area thoroughly with sterile saline.  No further purulence was noted.  I used 1/2 inch Penrose drain and placed the distal end at the deepest extent in the scrotum.  We brought this out through our  incision and sutured in place with 2-0 nylon sutures.  We loosely packed some gauze around the opening of the wound.  We then covered the wound with dry dressings.  This was held in place with elastic shorts.  The patient was then extubated and brought to the recovery room in stable condition.  All sponge, instrument, and needle counts are correct.  Imogene Burn. Georgette Dover, MD, [facility] Surgery  General/ Trauma Surgery  05/09/2017 8:46 AM

## 2017-05-09 NOTE — Progress Notes (Signed)
PROGRESS NOTE    Sadler Teschner  HKV:[id number] DOB: [birth year] DOA: 05/08/2017 PCP: System, Pcp Not In    Brief Narrative:  42 year old male who presented with groin pain.  Patient does have the significant past medical history for phimosis and type 2 diabetes mellitus.  Patient complained of 3 days of worsening groin pain, associated with genital swelling and local erythema.  Worsening symptoms to the point where he was having difficulty ambulating.  On the initial physical examination blood pressure 140/90, heart rate 95, oxygen saturation 100%.  His membranes, lungs were clear to auscultation bilaterally, heart sounds are present and rhythmic, abdomen was soft nontender.  Positive severe genital swelling, associated with erythema, and a scrotal fluctuant mass, tender to palpation.  Sodium 133, potassium 3.8, chloride 98, bicarb 21, glucose 241, BUN 13, creatinine 0.84, white count 18.3, hemoglobin 15.7, hematocrit 44.1, platelets 338, urinalysis greater than 500 glucose, pH 5.0, specific gravity 1.039, 0-5 white cells, 0-5 RBCs.  CT of the abdomen with 3.3 x 2.4 cm high right perineal/scrotal abscess.  Associated scrotal wall thickening/edema suggesting cellulitis.  No soft tissue gas to suggest necrotizing fasciitis.  2 nonobstructing left renal calculi measuring up to 2 mm.  No hydronephrosis.  EKG was sinus rhythm, normal axis, normal intervals.  Patient was admitted to the hospital with the working diagnosis of right scrotal abscess complicated by sepsis, and uncontrolled diabetes mellitus.   Assessment & Plan:   Active Problems:   Scrotal abscess   1.  Sepsis due to right scrotal abscess. Will continue antibiotic therapy with cefepime and vancomycin Iv (follow vanc levels per pharmacy protocol), follow on cultures, cell count and temperature curve. Continue IV fluids with balanced electrolyte solutions. Continue pain control with ketorolac and po oxycodone, dc IV hydromorphone, will try  to avid narcotics. Follow with surgery recommendations.   2.  Uncontrolled type 2 diabetes mellitus. Continue glucose cover and monitoring with insulin sliding scale, advance diet as tolerated. Capillary glucose 243, 268, 277, 219. Will calculate insulin requirements before starting basal dosing.    DVT prophylaxis: enoxaparin   Code Status: full Family Communication: I spoke with patient's family at the bedside and all questions were addressed.  Disposition Plan: home when clinically stable   Consultants:   surgery  Procedures:   Scrotal I&D  Antimicrobials:   IV vancomycin   IV cefepime   Subjective: Patient sp I&d of scrotal abscess, positive pain, moderate to severe in intensity, worse with movement, no radiation, persistent, no associated nausea or vomiting. No dyspnea or chest pain.   Objective: Vitals:   05/09/17 0900 05/09/17 0915 05/09/17 0930 05/09/17 0945  BP: 118/74 114/73 113/77 121/74  Pulse: 83 81 81 80  Resp: 20 20 (!) 21 20  Temp:   (!) 97.3 F (36.3 C)   TempSrc:      SpO2: 95% 94% 95% 94%  Weight:      Height:        Intake/Output Summary (Last 24 hours) at 05/09/2017 1048 Last data filed at 05/09/2017 0600 Gross per 24 hour  Intake 4845 ml  Output 1900 ml  Net 2945 ml   Filed Weights   05/09/17 0259  Weight: 108.9 kg (240 lb)    Examination:   General: Not in pain or dyspnea, deconditioned Neurology: Awake and alert, non focal  E ENT: mild pallor, no icterus, oral mucosa moist Cardiovascular: No JVD. S1-S2 present, rhythmic, no gallops, rubs, or murmurs. No lower extremity edema. Pulmonary: vesicular breath sounds bilaterally,  adequate air movement, no wheezing, rhonchi or rales. Gastrointestinal. Abdomen protuberant, no organomegaly, non tender, no rebound or guarding Skin.gentialia with dressing in place.  Musculoskeletal: no joint deformities     Data Reviewed: I have personally reviewed following labs and imaging  studies  CBC: Recent Labs  Lab 05/08/17 1711  WBC 18.3*  NEUTROABS 13.5*  HGB 15.7  HCT 44.1  MCV 85.0  PLT 256   Basic Metabolic Panel: Recent Labs  Lab 05/08/17 1711 05/09/17 0856  NA 133* 132*  K 3.8 4.2  CL 98* 101  CO2 21* 21*  GLUCOSE 241* 292*  BUN 13 9  CREATININE 0.84 0.88  CALCIUM 9.5 8.3*   GFR: Estimated Creatinine Clearance: 143.6 mL/min (by C-G formula based on SCr of 0.88 mg/dL). Liver Function Tests: Recent Labs  Lab 05/08/17 1711  AST 14*  ALT 16*  ALKPHOS 108  BILITOT 1.0  PROT 8.4*  ALBUMIN 3.5   No results for input(s): LIPASE, AMYLASE in the last 168 hours. No results for input(s): AMMONIA in the last 168 hours. Coagulation Profile: No results for input(s): INR, PROTIME in the last 168 hours. Cardiac Enzymes: No results for input(s): CKTOTAL, CKMB, CKMBINDEX, TROPONINI in the last 168 hours. BNP (last 3 results) No results for input(s): PROBNP in the last 8760 hours. HbA1C: Recent Labs    05/09/17 0857  HGBA1C 13.1*   CBG: Recent Labs  Lab 05/08/17 2109 05/08/17 2210 05/09/17 0858 05/09/17 0923  GLUCAP 219* 243* 268* 277*   Lipid Profile: No results for input(s): CHOL, HDL, LDLCALC, TRIG, CHOLHDL, LDLDIRECT in the last 72 hours. Thyroid Function Tests: No results for input(s): TSH, T4TOTAL, FREET4, T3FREE, THYROIDAB in the last 72 hours. Anemia Panel: No results for input(s): VITAMINB12, FOLATE, FERRITIN, TIBC, IRON, RETICCTPCT in the last 72 hours.    Radiology Studies: I have reviewed all of the imaging during this hospital visit personally     Scheduled Meds: . insulin aspart  0-20 Units Subcutaneous TID WC   Continuous Infusions: . dextrose 5 % and 0.45 % NaCl with KCl 20 mEq/L 125 mL/hr at 05/09/17 0132  . vancomycin 1,000 mg (05/09/17 0530)     LOS: 0 days        Mauricio Gerome Apley, MD [facility] Pager [phone]

## 2017-05-09 NOTE — Anesthesia Postprocedure Evaluation (Signed)
Anesthesia Post Note  Patient: Shaun Cummings  Procedure(s) Performed: IRRIGATION AND DEBRIDEMENT PERINEAL ABSCESS (Right Buttocks)     Patient location during evaluation: PACU Anesthesia Type: General Level of consciousness: sedated and patient cooperative Pain management: pain level controlled Vital Signs Assessment: post-procedure vital signs reviewed and stable Respiratory status: spontaneous breathing Cardiovascular status: stable Anesthetic complications: no    Last Vitals:  Vitals:   05/09/17 1112 05/09/17 1707  BP: 118/82 (!) 144/81  Pulse: 85 81  Resp: 18 18  Temp: 36.8 C 36.8 C  SpO2: 96% 95%    Last Pain:  Vitals:   05/09/17 1810  TempSrc:   PainSc: [place]

## 2017-05-09 NOTE — Progress Notes (Signed)
Pharmacy Antibiotic Note  Shaun Cummings is a 42 y.o. male admitted on 05/08/2017 with scrotal abscess.  Pharmacy has been consulted for vancomycin and cefepime dosing.  Patient febrile to 103.2, WBC 18.3. CT revealed scrotal abscess 3.3 x 2.4 cm. Now s/p successful I&D procedure. Received 1 dose of cefepime last night, and continues on vancomycin. Abscess culture growing multiple organisms.  Plan: Continue vancomycin 1000 mg IV q 8 hours (goal trough 10-15) Start cefepime 1 gram IV q 8 hours Monitor renal function, cultures, clinical status, LOT/abx plans   Height: 6' (182.9 cm) Weight: 240 lb (108.9 kg) IBW/kg (Calculated) : 77.6  Temp (24hrs), Avg:99 F (37.2 C), Min:97 F (36.1 C), Max:103.2 F (39.6 C)  Recent Labs  Lab 05/08/17 1711 05/08/17 1723 05/09/17 0856  WBC 18.3*  --   --   CREATININE 0.84  --  0.88  LATICACIDVEN  --  1.37  --     Estimated Creatinine Clearance: 143.6 mL/min (by C-G formula based on SCr of 0.88 mg/dL).    Allergies  Allergen Reactions  . Augmentin [Amoxicillin-Pot Clavulanate] Hives and Itching    Has patient had a PCN reaction causing immediate rash, facial/tongue/throat swelling, SOB or lightheadedness with hypotension: Yes Has patient had a PCN reaction causing severe rash involving mucus membranes or skin necrosis: No Has patient had a PCN reaction that required hospitalization: No Has patient had a PCN reaction occurring within the last 10 years: No If all of the above answers are "NO", then may proceed with Cephalosporin use.    Antimicrobials this admission: 4/13 vancomycin >>  4/13 Cefepime >>  Microbiology results: /14 abscess cx: abundant GPR, GNR, GPC in pairs/chains 4/13 blood: ngtd 4/14 surg pcr: negative MRSA 4/13 urine: sent  Thank you for allowing pharmacy to be a part of this patient's care.  Charlene Brooke, PharmD PGY1 Pharmacy Resident Phone: [phone] After 3:30PM please call [place] [phone] 05/09/2017  12:41 PM

## 2017-05-09 NOTE — Anesthesia Preprocedure Evaluation (Addendum)
Anesthesia Evaluation  Patient identified by MRN, date of birth, ID band Patient awake    Reviewed: Allergy & Precautions, NPO status , Patient's Chart, lab work & pertinent test results  Airway Mallampati: I  TM Distance: >3 FB Neck ROM: Full    Dental  (+) Teeth Intact, Dental Advisory Given   Pulmonary sleep apnea , former smoker,    breath sounds clear to auscultation       Cardiovascular negative cardio ROS   Rhythm:Regular Rate:Normal     Neuro/Psych  Neuromuscular disease negative psych ROS   GI/Hepatic negative GI ROS, Neg liver ROS,   Endo/Other  diabetes, Type 2  Renal/GU negative Renal ROS     Musculoskeletal negative musculoskeletal ROS (+)   Abdominal   Peds  Hematology   Anesthesia Other Findings   Reproductive/Obstetrics negative OB ROS                             Lab Results  Component Value Date   WBC 18.3 (H) 05/08/2017   HGB 15.7 05/08/2017   HCT 44.1 05/08/2017   MCV 85.0 05/08/2017   PLT 338 05/08/2017   Lab Results  Component Value Date   CREATININE 0.84 05/08/2017   BUN 13 05/08/2017   NA 133 (L) 05/08/2017   K 3.8 05/08/2017   CL 98 (L) 05/08/2017   CO2 21 (L) 05/08/2017   No results found for: INR, PROTIME  EKG: normal sinus rhythm.   Anesthesia Physical  Anesthesia Plan  ASA: III  Anesthesia Plan: General   Post-op Pain Management:    Induction: Intravenous  PONV Risk Score and Plan: 3  Airway Management Planned: Oral ETT and LMA  Additional Equipment:   Intra-op Plan:   Post-operative Plan: Extubation in OR  Informed Consent: I have reviewed the patients History and Physical, chart, labs and discussed the procedure including the risks, benefits and alternatives for the proposed anesthesia with the patient or authorized representative who has indicated his/her understanding and acceptance.   Dental advisory given  Plan  Discussed with: CRNA  Anesthesia Plan Comments: (\)        Anesthesia Quick Evaluation

## 2017-05-10 ENCOUNTER — Encounter (HOSPITAL_COMMUNITY): Payer: Self-pay | Admitting: Surgery

## 2017-05-10 LAB — CBC WITH DIFFERENTIAL/PLATELET
Basophils Absolute: 0 K/uL (ref 0.0–0.1)
Basophils Relative: 0 %
Eosinophils Absolute: 0.1 K/uL (ref 0.0–0.7)
Eosinophils Relative: 0 %
HCT: 37.5 % — ABNORMAL LOW (ref 39.0–52.0)
Hemoglobin: 12.8 g/dL — ABNORMAL LOW (ref 13.0–17.0)
Lymphocytes Relative: 18 %
Lymphs Abs: 3.3 K/uL (ref 0.7–4.0)
MCH: 29.8 pg (ref 26.0–34.0)
MCHC: 34.1 g/dL (ref 30.0–36.0)
MCV: 87.2 fL (ref 78.0–100.0)
Monocytes Absolute: 1 K/uL (ref 0.1–1.0)
Monocytes Relative: 5 %
Neutro Abs: 13.8 K/uL — ABNORMAL HIGH (ref 1.7–7.7)
Neutrophils Relative %: 77 %
Platelets: 306 K/uL (ref 150–400)
RBC: 4.3 MIL/uL (ref 4.22–5.81)
RDW: 12.4 % (ref 11.5–15.5)
WBC: 18.2 K/uL — ABNORMAL HIGH (ref 4.0–10.5)

## 2017-05-10 LAB — BASIC METABOLIC PANEL WITH GFR
Anion gap: 11 (ref 5–15)
BUN: 12 mg/dL (ref 6–20)
CO2: 23 mmol/L (ref 22–32)
Calcium: 8.7 mg/dL — ABNORMAL LOW (ref 8.9–10.3)
Chloride: 101 mmol/L (ref 101–111)
Creatinine, Ser: 0.73 mg/dL (ref 0.61–1.24)
GFR calc Af Amer: >60 mL/min
GFR calc non Af Amer: >60 mL/min
Glucose, Bld: 271 mg/dL — ABNORMAL HIGH (ref 65–99)
Potassium: 3.9 mmol/L (ref 3.5–5.1)
Sodium: 135 mmol/L (ref 135–145)

## 2017-05-10 LAB — GLUCOSE, CAPILLARY
GLUCOSE-CAPILLARY: 276 mg/dL — AB (ref 65–99)
GLUCOSE-CAPILLARY: 250 mg/dL — AB (ref 65–99)
Glucose-Capillary: 300 mg/dL — ABNORMAL HIGH (ref 65–99)
Glucose-Capillary: 264 mg/dL — ABNORMAL HIGH (ref 65–99)

## 2017-05-10 LAB — URINE CULTURE: Culture: NO GROWTH

## 2017-05-10 MED ORDER — VANCOMYCIN HCL IN DEXTROSE 1-5 GM/200ML-% IV SOLN
1000.0000 mg | Freq: Three times a day (TID) | INTRAVENOUS | Status: DC
  Administered 2017-05-11: 1000 mg via INTRAVENOUS
  Filled 2017-05-10 (×3): qty 200

## 2017-05-10 MED ORDER — INSULIN DETEMIR 100 UNIT/ML ~~LOC~~ SOLN
15.0000 [IU] | Freq: Two times a day (BID) | SUBCUTANEOUS | Status: DC
  Administered 2017-05-10 – 2017-05-12 (×6): 15 [IU] via SUBCUTANEOUS
  Filled 2017-05-10 (×7): qty 0.15

## 2017-05-10 MED ORDER — SODIUM CHLORIDE 0.9 % IV SOLN
1.0000 g | Freq: Three times a day (TID) | INTRAVENOUS | Status: DC
  Administered 2017-05-10 – 2017-05-13 (×9): 1 g via INTRAVENOUS
  Filled 2017-05-10 (×10): qty 1

## 2017-05-10 MED ORDER — OXYCODONE HCL 5 MG PO TABS
5.0000 mg | ORAL_TABLET | ORAL | Status: DC | PRN
  Administered 2017-05-10 – 2017-05-14 (×16): 10 mg via ORAL
  Filled 2017-05-10 (×16): qty 2

## 2017-05-10 MED ORDER — MUPIROCIN 2 % EX OINT
1.0000 "application " | TOPICAL_OINTMENT | Freq: Two times a day (BID) | CUTANEOUS | Status: DC
  Administered 2017-05-10 – 2017-05-14 (×8): 1 via NASAL
  Filled 2017-05-10 (×2): qty 22

## 2017-05-10 MED ORDER — INSULIN STARTER KIT- SYRINGES (ENGLISH)
1.0000 | Freq: Once | Status: AC
  Administered 2017-05-10: 1
  Filled 2017-05-10: qty 1

## 2017-05-10 MED ORDER — OXYCODONE HCL 5 MG PO TABS: 5.0000 mg | ORAL_TABLET | ORAL | 0 refills | Status: DC | PRN

## 2017-05-10 MED ORDER — PANTOPRAZOLE SODIUM 40 MG PO TBEC
40.0000 mg | DELAYED_RELEASE_TABLET | Freq: Every day | ORAL | Status: DC
  Administered 2017-05-10 – 2017-05-14 (×6): 40 mg via ORAL
  Filled 2017-05-10 (×6): qty 1

## 2017-05-10 MED ORDER — SODIUM CHLORIDE 0.9 % IV SOLN
1.0000 g | Freq: Three times a day (TID) | INTRAVENOUS | Status: DC
  Filled 2017-05-10 (×3): qty 1

## 2017-05-10 MED ORDER — INSULIN DETEMIR 100 UNIT/ML ~~LOC~~ SOLN: 15.0000 [IU] | Freq: Two times a day (BID) | SUBCUTANEOUS | Status: DC

## 2017-05-10 MED ORDER — OXYCODONE HCL 5 MG PO TABS
5.0000 mg | ORAL_TABLET | ORAL | Status: DC | PRN
  Administered 2017-05-10: 5 mg via ORAL
  Filled 2017-05-10: qty 1

## 2017-05-10 MED ORDER — CHLORHEXIDINE GLUCONATE CLOTH 2 % EX PADS
6.0000 | MEDICATED_PAD | Freq: Every day | CUTANEOUS | Status: DC
  Administered 2017-05-10 – 2017-05-14 (×4): 6 via TOPICAL

## 2017-05-10 MED ORDER — INSULIN DETEMIR 100 UNIT/ML ~~LOC~~ SOLN
20.0000 [IU] | Freq: Two times a day (BID) | SUBCUTANEOUS | Status: DC
  Filled 2017-05-10: qty 0.2

## 2017-05-10 MED ORDER — MORPHINE SULFATE (PF) 4 MG/ML IV SOLN
4.0000 mg | Freq: Once | INTRAVENOUS | Status: AC
  Administered 2017-05-10: 4 mg via INTRAVENOUS
  Filled 2017-05-10: qty 1

## 2017-05-10 NOTE — Progress Notes (Signed)
PROGRESS NOTE    Shaun Cummings  CVE:[id number] DOB: 01-28-1975 DOA: 05/08/2017 PCP: System, Pcp Not In    Brief Narrative:  42 year old male who presented with groin pain.  Patient does have the significant past medical history for phimosis and type 2 diabetes mellitus.  Patient complained of 3 days of worsening groin pain, associated with genital swelling and local erythema.  Worsening symptoms to the point where he was having difficulty ambulating.  On the initial physical examination blood pressure 140/90, heart rate 95, oxygen saturation 100%.  His membranes, lungs were clear to auscultation bilaterally, heart sounds are present and rhythmic, abdomen was soft nontender.  Positive severe genital swelling, associated with erythema, and a scrotal fluctuant mass, tender to palpation.  Sodium 133, potassium 3.8, chloride 98, bicarb 21, glucose 241, BUN 13, creatinine 0.84, white count 18.3, hemoglobin 15.7, hematocrit 44.1, platelets 338, urinalysis greater than 500 glucose, pH 5.0, specific gravity 1.039, 0-5 white cells, 0-5 RBCs.  CT of the abdomen with 3.3 x 2.4 cm high right perineal/scrotal abscess.  Associated scrotal wall thickening/edema suggesting cellulitis.  No soft tissue gas to suggest necrotizing fasciitis.  2 nonobstructing left renal calculi measuring up to 2 mm.  No hydronephrosis.  EKG was sinus rhythm, normal axis, normal intervals.  Patient was admitted to the hospital with the working diagnosis of right scrotal abscess complicated by sepsis, and uncontrolled diabetes mellitus.   Assessment & Plan:   Active Problems:   Scrotal abscess   Sepsis ([place])   1.  Sepsis due to right scrotal abscess. Persistent leukocytosis, abscess culture with poly-bacterial, will continue antibiotic therapy with cefepime and vancomycin Iv. Patient has remained afebrile. Continue pain control with IV ketorolac and po oxycodone. Will follow cell count in am. Follow with surgical team  recommendations, local wound care.   2.  Uncontrolled type 2 diabetes mellitus. Persistent and uncontrolled hyperglycemia, calculated insulin requirements, 64 units over last 24 hours, 70% will be about 40 units, will start patient on 30 units in 2 divided doses of long acting insulin (levimir). Continue glucose cover and monitoring with insulin sliding scale. Will aim for tight control in the setting of abscess sp I&D.   3. Obesity. Calculated BMI 32. Follow physical therapy recommendations, out of bed as tolerated.   DVT prophylaxis: enoxaparin   Code Status: full Family Communication: I spoke with patient's family at the bedside and all questions were addressed.  Disposition Plan: home when clinically stable   Consultants:   surgery  Procedures:   Scrotal I&D  Antimicrobials:   IV vancomycin   IV cefepime   Subjective: Patient with persistent edema at the genital region, associate with local pain and erythema, the pain is worse with movement, has decreased in intensity but still present, no associated nausea or vomiting, improved with analgesics.   Objective: Vitals:   05/09/17 1707 05/09/17 2124 05/10/17 0442 05/10/17 0943  BP: (!) 144/81 115/81 116/71 112/64  Pulse: 81 81 64 87  Resp: 18 16 18 18   Temp: 98.2 F (36.8 C) 99.3 F (37.4 C) (!) 97.5 F (36.4 C) 98.4 F (36.9 C)  TempSrc: Oral Oral Oral Oral  SpO2: 95% 95% 98% 93%  Weight:  108.8 kg (239 lb 13.8 oz)    Height:        Intake/Output Summary (Last 24 hours) at 05/10/2017 1026 Last data filed at 05/10/2017 0600 Gross per 24 hour  Intake 2505 ml  Output 0 ml  Net 2505 ml   [employer]  05/09/17 0259 05/09/17 2124  Weight: 108.9 kg (240 lb) 108.8 kg (239 lb 13.8 oz)    Examination:   General: deconditioned Neurology: Awake and alert, non focal  E ENT: no pallor, no icterus, oral mucosa moist Cardiovascular: No JVD. S1-S2 present, rhythmic, no gallops, rubs, or murmurs. No lower  extremity edema. Pulmonary: vesicular breath sounds bilaterally, adequate air movement, no wheezing, rhonchi or rales. Gastrointestinal. Abdomen protuberant, no organomegaly, non tender, no rebound or guarding Skin. No rashes Musculoskeletal: no joint deformities Genital region, with significant scrotal edema, and erythema, tender to palpation, no purulence.     Data Reviewed: I have personally reviewed following labs and imaging studies  CBC: Recent Labs  Lab 05/08/17 1711 05/10/17 0628  WBC 18.3* 18.2*  NEUTROABS 13.5* 13.8*  HGB 15.7 12.8*  HCT 44.1 37.5*  MCV 85.0 87.2  PLT 338 671   Basic Metabolic Panel: Recent Labs  Lab 05/08/17 1711 05/09/17 0856 05/10/17 0628  NA 133* 132* 135  K 3.8 4.2 3.9  CL 98* 101 101  CO2 21* 21* 23  GLUCOSE 241* 292* 271*  BUN 13 9 12   CREATININE 0.84 0.88 0.73  CALCIUM 9.5 8.3* 8.7*   GFR: Estimated Creatinine Clearance: 158 mL/min (by C-G formula based on SCr of 0.73 mg/dL). Liver Function Tests: Recent Labs  Lab 05/08/17 1711  AST 14*  ALT 16*  ALKPHOS 108  BILITOT 1.0  PROT 8.4*  ALBUMIN 3.5   No results for input(s): LIPASE, AMYLASE in the last 168 hours. No results for input(s): AMMONIA in the last 168 hours. Coagulation Profile: No results for input(s): INR, PROTIME in the last 168 hours. Cardiac Enzymes: No results for input(s): CKTOTAL, CKMB, CKMBINDEX, TROPONINI in the last 168 hours. BNP (last 3 results) No results for input(s): PROBNP in the last 8760 hours. HbA1C: Recent Labs    05/09/17 0857  HGBA1C 13.1*   CBG: Recent Labs  Lab 05/09/17 0923 05/09/17 1108 05/09/17 1707 05/09/17 2123 05/10/17 0728  GLUCAP 277* 219* 503* 361* 300*   Lipid Profile: No results for input(s): CHOL, HDL, LDLCALC, TRIG, CHOLHDL, LDLDIRECT in the last 72 hours. Thyroid Function Tests: No results for input(s): TSH, T4TOTAL, FREET4, T3FREE, THYROIDAB in the last 72 hours. Anemia Panel: No results for input(s):  VITAMINB12, FOLATE, FERRITIN, TIBC, IRON, RETICCTPCT in the last 72 hours.    Radiology Studies: I have reviewed all of the imaging during this hospital visit personally     Scheduled Meds: . Chlorhexidine Gluconate Cloth  6 each Topical Daily  . insulin aspart  0-20 Units Subcutaneous TID WC  . mupirocin ointment  1 application Nasal BID   Continuous Infusions: . ceFEPime (MAXIPIME) IV Stopped (05/10/17 2458)  . lactated ringers 75 mL/hr at 05/09/17 1716  . vancomycin 1,000 mg (05/10/17 0826)     LOS: 1 day        Tawni Millers, MD [facility] Pager [phone]

## 2017-05-10 NOTE — Progress Notes (Signed)
Inpatient Diabetes Program Recommendations  AACE/ADA: New Consensus Statement on Inpatient Glycemic Control (2015)  Target Ranges:  Prepandial:   less than 140 mg/dL      Peak postprandial:   less than 180 mg/dL (1-2 hours)      Critically ill patients:  140 - 180 mg/dL   Lab Results  Component Value Date   GLUCAP 300 (H) 05/10/2017   HGBA1C 13.1 (H) 05/09/2017    Review of Glycemic Control Results for BRYSON, GAVIA (MRN [id number]) as of 05/10/2017 10:44  Ref. Range 05/09/2017 11:08 05/09/2017 17:07 05/09/2017 21:23 05/10/2017 07:28  Glucose-Capillary Latest Ref Range: 65 - 99 mg/dL 219 (H) 503 (HH) 361 (H) 300 (H)   Diabetes history: Type 2 DM Outpatient Diabetes medications: none Current orders for Inpatient glycemic control: Novolog 0-20 units TID  Inpatient Diabetes Program Recommendations:    Per chart it seems that patient has been on oral medications and has tried them previously. With A1C of 13.1% will plan to see patient today to discuss long term plan, specifically insulin. Would be recommending Novolin 70/30 12 units BID. Will assess patient.   Thanks, Bronson Curb, MSN, RNC-OB Diabetes Coordinator [phone] (8a-5p)

## 2017-05-10 NOTE — Progress Notes (Addendum)
Inpatient Diabetes Program Recommendations  AACE/ADA: New Consensus Statement on Inpatient Glycemic Control (2015)  Target Ranges:  Prepandial:   less than 140 mg/dL      Peak postprandial:   less than 180 mg/dL (1-2 hours)      Critically ill patients:  140 - 180 mg/dL   Lab Results  Component Value Date   GLUCAP 276 (H) 05/10/2017   HGBA1C 13.1 (H) 05/09/2017    Reviewed patient's current A1c of 13.1%. Explained what a A1c is and what it measures. Also reviewed goal A1c with patient, importance of good glucose control @ home, and blood sugar goals. Discussed patho of DM, need for insulin, vascular changes that occur from increased A1C, survival skills, and co morbidities associated.   Diet was discussed and patient eats primarily protein. He states, " I used to weigh 450 lbs, so my diet is much better than it used to be." Does not drink sugary beverages. Encouraged to be mindful at looking at food labels for carbohydrates in grams, and begin paying attention to BS following meals. Patient very motivated to become more active and learning carb counting.   Patient will need a meter and supplies at discharge. Blood glucose meter and kit (includes strips and lancets)([id number]) Encouraged to begin using and checking 2-3 times/day.  At discharge, would recommend Novolin 70/30 as outpatient and base dosage according to inpatient total daily insulin needs.  Explained and demonstrated proper technique on how to draw up insulin with vial and syringe. Showed patient how to draw up air, inject air into vial, draw up insulin, and inject into practice dome. Patient demonstrated competency with insulin administration by vial and syringe. RN to continue working with patient on insulin injections before discharge and allow patient to administer own insulin injections while inpatient. Patient is open to insulin, but is hesitant based on cost. He said that another provider had tried to prescribe insulin,  costing him and his family upwards of $500/month. This is something he will not stay compliant with, as he refused to pay. We discussed Novolin brand insulin, through [facility] for $25/vial/month. Patient states that this is doable to help improve his health status.  Will place RN care instructions, dietitian consult, and LWWDM (to have additional resource for information).   Thanks, Bronson Curb, MSN, RNC-OB Diabetes Coordinator [phone] (8a-5p)

## 2017-05-10 NOTE — Progress Notes (Signed)
Patient ID: Shaun Cummings, male   DOB: 1975-12-24, 42 y.o.   MRN: [id number]    1 Day Post-Op  Subjective: Pt still having pain, but worse when he tries to void as his penis has retracted secondary to scrotal edema.  Wants to go home.  Objective: Vital signs in last 24 hours: Temp:  [97 F (36.1 C)-99.3 F (37.4 C)] 97.5 F (36.4 C) (04/15 0442) Pulse Rate:  [64-93] 64 (04/15 0442) Resp:  [16-21] 18 (04/15 0442) BP: (113-144)/(70-82) 116/71 (04/15 0442) SpO2:  [94 %-98 %] 98 % (04/15 0442) Weight:  [108.8 kg (239 lb 13.8 oz)] 108.8 kg (239 lb 13.8 oz) (04/14 2124) Last BM Date: 05/08/17  Intake/Output from previous day: 04/14 0701 - 04/15 0700 In: 2555 [P.O.:600; I.V.:1155; IV Piggyback:800] Out: 10 [Blood:10] Intake/Output this shift: No intake/output data recorded.  PE: GU: right inguinal wound with penrose drain in place.  Packing removed.  No active drainage at this time.  Scrotum still with erythema and skin thickening.  Very tender.  Lab Results:  Recent Labs    05/08/17 1711 05/10/17 0628  WBC 18.3* 18.2*  HGB 15.7 12.8*  HCT 44.1 37.5*  PLT 338 306   BMET Recent Labs    05/09/17 0856 05/10/17 0628  NA 132* 135  K 4.2 3.9  CL 101 101  CO2 21* 23  GLUCOSE 292* 271*  BUN 9 12  CREATININE 0.88 0.73  CALCIUM 8.3* 8.7*   PT/INR No results for input(s): LABPROT, INR in the last 72 hours. CMP     Component Value Date/Time   NA 135 05/10/2017 0628   K 3.9 05/10/2017 0628   CL 101 05/10/2017 0628   CO2 23 05/10/2017 0628   GLUCOSE 271 (H) 05/10/2017 0628   BUN 12 05/10/2017 0628   CREATININE 0.73 05/10/2017 0628   CREATININE 0.91 09/15/2013 1727   CALCIUM 8.7 (L) 05/10/2017 0628   PROT 8.4 (H) 05/08/2017 1711   ALBUMIN 3.5 05/08/2017 1711   AST 14 (L) 05/08/2017 1711   ALT 16 (L) 05/08/2017 1711   ALKPHOS 108 05/08/2017 1711   BILITOT 1.0 05/08/2017 1711   GFRNONAA >60 05/10/2017 0628   GFRAA >60 05/10/2017 0628   Lipase  No results found  for: LIPASE     Studies/Results: Ct Abdomen Pelvis W Contrast  Result Date: 05/08/2017 CLINICAL DATA:  Scrotal abscess, concern for necrotizing fasciitis EXAM: CT ABDOMEN AND PELVIS WITH CONTRAST TECHNIQUE: Multidetector CT imaging of the abdomen and pelvis was performed using the standard protocol following bolus administration of intravenous contrast. CONTRAST:  128mL ISOVUE-300 IOPAMIDOL (ISOVUE-300) INJECTION 61% COMPARISON:  Scrotal ultrasound dated 05/08/2017 FINDINGS: Lower chest: Lung bases are clear. Hepatobiliary: Liver is within normal limits. Gallbladder is unremarkable. No intrahepatic or extrahepatic ductal dilatation. Pancreas: Within normal limits. Spleen: Within normal limits. Adrenals/Urinary Tract: Adrenal glands are within normal limits. Two nonobstructing right renal calculi measuring up to 2 mm in the left upper kidney (series 3/images 36 and 42). Right kidney is within normal limits. No hydronephrosis. Bladder is within normal limits. Stomach/Bowel: Stomach is within normal limits. No evidence of bowel obstruction. Normal appendix (series 3/image 72). Vascular/Lymphatic: No evidence of abdominal aortic aneurysm. No suspicious abdominopelvic lymphadenopathy. Right inguinal nodes measuring up to 1.7 cm short axis (series 3/image 101), likely reactive. Reproductive: Prostate is unremarkable. Scrotal wall thickening/edema, suggesting cellulitis. Associated 3.3 x 2.4 cm high right perineal/scrotal abscess (series 3/image 115). No soft tissue gas to suggest necrotizing fasciitis. Other: No abdominopelvic ascites. Musculoskeletal:  Mild degenerative changes of the lower thoracic spine. IMPRESSION: 3.3 x 2.4 cm high right perineal/scrotal abscess. Associated scrotal wall thickening/edema, suggesting cellulitis. No soft tissue gas to suggest necrotizing fasciitis. Two nonobstructing left renal calculi measuring up to 2 mm. No hydronephrosis. Electronically Signed   By: Julian Hy M.D.    On: 05/08/2017 19:31   US Scrotum W/doppler  Result Date: 05/08/2017 CLINICAL DATA:  Right-sided scrotal swelling for several days EXAM: SCROTAL ULTRASOUND DOPPLER ULTRASOUND OF THE TESTICLES TECHNIQUE: Complete ultrasound examination of the testicles, epididymis, and other scrotal structures was performed. Color and spectral Doppler ultrasound were also utilized to evaluate blood flow to the testicles. COMPARISON:  None. FINDINGS: Right testicle Measurements: 4.4 x 1.9 x 2.9 cm. No mass or microlithiasis visualized. Left testicle Measurements: 3.8 x 1.8 x 2.4 cm. No mass or microlithiasis visualized. Right epididymis: There is a cyst measuring 0.4 x 0.3 x 0.3 cm in the head of the epididymis on the right. No inflammatory focus. Left epididymis: Normal in size and appearance. No inflammatory focus. Hydrocele:  None visualized. Varicocele:  None visualized. Pulsed Doppler interrogation of both testes demonstrates normal low resistance arterial and venous waveforms bilaterally. There is diffuse skin and subcutaneous thickening throughout the right scrotal region. A lesser degree of thickening is noted on the left. No well-defined scrotal abscess by ultrasound. IMPRESSION: 1. Subcutaneous and skin thickening throughout the scrotum, more severe on the right than on the left, findings felt to represent cellulitis. No well-defined abscess appreciable. 2. Small cyst head of the epididymis on the right. No other mass lesion evident. 3. No orchitis or epididymitis. No testicular torsion on either side. Electronically Signed   By: Lowella Grip III M.D.   On: 05/08/2017 18:03    Anti-infectives: Anti-infectives (From admission, onward)   Start     Dose/Rate Route Frequency Ordered Stop   05/09/17 1400  ceFEPIme (MAXIPIME) 1 g in sodium chloride 0.9 % 100 mL IVPB     1 g 200 mL/hr over 30 Minutes Intravenous Every 8 hours 05/09/17 1201     05/09/17 0800  clindamycin (CLEOCIN) IVPB 900 mg  Status:  Discontinued      900 mg 100 mL/hr over 30 Minutes Intravenous To ShortStay Surgical 05/09/17 0335 05/09/17 1014   05/09/17 0600  vancomycin (VANCOCIN) IVPB 1000 mg/200 mL premix     1,000 mg 200 mL/hr over 60 Minutes Intravenous Every 8 hours 05/08/17 1850     05/08/17 1900  ceFEPIme (MAXIPIME) 2 g in sodium chloride 0.9 % 100 mL IVPB     2 g 200 mL/hr over 30 Minutes Intravenous  Once 05/08/17 1847 05/08/17 2022   05/08/17 1845  piperacillin-tazobactam (ZOSYN) IVPB 3.375 g  Status:  Discontinued     3.375 g 100 mL/hr over 30 Minutes Intravenous  Once 05/08/17 1834 05/08/17 1834   05/08/17 1845  vancomycin (VANCOCIN) 2,000 mg in sodium chloride 0.9 % 500 mL IVPB     2,000 mg 250 mL/hr over 120 Minutes Intravenous  Once 05/08/17 1835 05/08/17 2335   05/08/17 1830  piperacillin-tazobactam (ZOSYN) IVPB 4.5 g  Status:  Discontinued     4.5 g 200 mL/hr over 30 Minutes Intravenous  Once 05/08/17 1829 05/08/17 1833       Assessment/Plan  Right Inguinal Abscess, POD 1, s/p I&D  -CX :    ABUNDANT WBC PRESENT, PREDOMINANTLY PMN  ABUNDANT GRAM POSITIVE RODS  ABUNDANT GRAM NEGATIVE RODS  MODERATE GRAM POSITIVE COCCI IN PAIRS IN CHAINS      -  WBC still 18K.  Apparently patient states he has a chronically elevated WBC since birth along with his sister and mother.  Doesn't know what it's called and doesn't know what his baseline is. -on oxy, toradol, and tylenol -patient has been adequately drained, but still with quite a bit of edema and erythema, particularly of his scrotum.  Patient really wants to go home. He is afebrile on not on IV pain medications.  We will have him follow up in the office in 2 weeks.  I have discussed wound care with him if he is discharged today.  FEN - carb mod diet VTE - SCDs/could start chemical prophylaxis from our standpoint ID - Vanc   LOS: 1 day    Henreitta Cea , [facility] 05/10/2017, 8:23 AM Pager: [phone]

## 2017-05-10 NOTE — Care Management Note (Addendum)
Case Management Note  Patient Details  Name: Shaun Cummings MRN: [id number] Date of Birth: [birth year]  Subjective/Objective:  History of phimosis and type 2 diabetes mellitus; admitted for Scrotal abscess 4/14 s/p I & D Perineal Abscess. No PCP noted.          Action/Plan: Appointment made for patient to establish PCP, see follow-providers for additional information.  Left voicemail message for Practice Administrator: Lovett Sox advised patient uninsured and needs to establish PCP.  Awaiting return call from [place]    Patient uses [facility] in [place], [place].  Prior to admission patient lived at home with wife. Will be returning to same living environment.  Patient uninsured [facility] Program letter provided for discharge.  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  Married - [name] - she works Part Time a Programmer, multimedia. Makes $10/hr 4hrs/day every other day.  2 Children - both with Medicaid.  Has not applied for Medicaid in the past 3 months; Previously told they make too much money.  Has not applied for SSI/SSID in past 6 months.  States no disability. Has a savings account but states there is absolutely nothing in it.  NCM will continue to follow for discharge planning needs.  Expected Discharge Date:                  Expected Discharge Plan:  Home/Self Care  Discharge planning Services  CM Consult, Other - See comment(Appointment made to establish PCP)  DME Arranged:    DME Agency:     HH Arranged:    HH Agency:     Status of Service:  In process, will continue to follow  Kristen Cardinal, RN  Nurse case [place] 05/10/2017, 10:06 AM

## 2017-05-10 NOTE — Progress Notes (Addendum)
In to speak with patient.  NCM advised patient although patient has been seen at [facility] in 2015; he would be a considered a new patient now and [facility] are not taking any new patient's at this time. Also advised NCM will be setting up new patient appointment per request to establish PCP, this will be on discharge summary as follow up.  Discussed [place] with patient advised of process including only available once every 12 months; and that [place] does not cover diabetic supplies or glucometer, patient verbalized understanding.  Montel Culver, BSN, RN Nurse Case [employer]

## 2017-05-10 NOTE — Discharge Instructions (Signed)
Keep wound dry.  Cover it prior to showering to avoid water staying in tunnel. Keep penrose in place.  If it comes out, you can use a Qtip to push it back in the hole. Please call our office if you develop fevers over 101, worsening pain, purulent drainage.

## 2017-05-11 DIAGNOSIS — Z6841 Body Mass Index (BMI) 40.0 and over, adult: Secondary | ICD-10-CM

## 2017-05-11 LAB — BASIC METABOLIC PANEL
Anion gap: 12 (ref 5–15)
BUN: 9 mg/dL (ref 6–20)
CHLORIDE: 99 mmol/L — AB (ref 101–111)
CO2: 22 mmol/L (ref 22–32)
CREATININE: 0.63 mg/dL (ref 0.61–1.24)
Calcium: 8.1 mg/dL — ABNORMAL LOW (ref 8.9–10.3)
Glucose, Bld: 181 mg/dL — ABNORMAL HIGH (ref 65–99)
POTASSIUM: 2.8 mmol/L — AB (ref 3.5–5.1)
Sodium: 133 mmol/L — ABNORMAL LOW (ref 135–145)

## 2017-05-11 LAB — CBC WITH DIFFERENTIAL/PLATELET
BLASTS: 0 %
Band Neutrophils: 22 %
Basophils Absolute: 0 10*3/uL (ref 0.0–0.1)
Basophils Relative: 0 %
EOS PCT: 1 %
Eosinophils Absolute: 0.2 10*3/uL (ref 0.0–0.7)
HEMATOCRIT: 33.3 % — AB (ref 39.0–52.0)
HEMOGLOBIN: 11.6 g/dL — AB (ref 13.0–17.0)
LYMPHS ABS: 3.8 10*3/uL (ref 0.7–4.0)
Lymphocytes Relative: 19 %
MCH: 29.4 pg (ref 26.0–34.0)
MCHC: 34.8 g/dL (ref 30.0–36.0)
MCV: 84.3 fL (ref 78.0–100.0)
MONOS PCT: 6 %
MYELOCYTES: 0 %
Metamyelocytes Relative: 0 %
Monocytes Absolute: 1.2 10*3/uL — ABNORMAL HIGH (ref 0.1–1.0)
NEUTROS PCT: 52 %
NRBC: 0 /100{WBCs}
Neutro Abs: 14.6 10*3/uL — ABNORMAL HIGH (ref 1.7–7.7)
Other: 0 %
Platelets: 309 10*3/uL (ref 150–400)
Promyelocytes Relative: 0 %
RBC: 3.95 MIL/uL — AB (ref 4.22–5.81)
RDW: 11.9 % (ref 11.5–15.5)
WBC: 19.8 10*3/uL — AB (ref 4.0–10.5)

## 2017-05-11 LAB — GLUCOSE, CAPILLARY
GLUCOSE-CAPILLARY: 210 mg/dL — AB (ref 65–99)
Glucose-Capillary: 227 mg/dL — ABNORMAL HIGH (ref 65–99)
Glucose-Capillary: 207 mg/dL — ABNORMAL HIGH (ref 65–99)
Glucose-Capillary: 186 mg/dL — ABNORMAL HIGH (ref 65–99)

## 2017-05-11 LAB — VANCOMYCIN, TROUGH: Vancomycin Tr: 7 ug/mL — ABNORMAL LOW (ref 15–20)

## 2017-05-11 MED ORDER — VANCOMYCIN HCL 10 G IV SOLR
1250.0000 mg | Freq: Three times a day (TID) | INTRAVENOUS | Status: DC
  Administered 2017-05-11 – 2017-05-13 (×7): 1250 mg via INTRAVENOUS
  Filled 2017-05-11 (×8): qty 1250

## 2017-05-11 MED ORDER — MORPHINE SULFATE (PF) 2 MG/ML IV SOLN
1.0000 mg | INTRAVENOUS | Status: DC | PRN
  Administered 2017-05-11 – 2017-05-12 (×3): 2 mg via INTRAVENOUS
  Filled 2017-05-11 (×2): qty 1

## 2017-05-11 MED ORDER — MORPHINE SULFATE (PF) 2 MG/ML IV SOLN
INTRAVENOUS | Status: AC
  Filled 2017-05-11: qty 1

## 2017-05-11 MED ORDER — POTASSIUM CHLORIDE CRYS ER 20 MEQ PO TBCR
40.0000 meq | EXTENDED_RELEASE_TABLET | ORAL | Status: AC
  Administered 2017-05-11 (×2): 40 meq via ORAL
  Filled 2017-05-11 (×2): qty 2

## 2017-05-11 NOTE — Progress Notes (Signed)
Inpatient Diabetes Program Recommendations  AACE/ADA: New Consensus Statement on Inpatient Glycemic Control (2015)  Target Ranges:  Prepandial:   less than 140 mg/dL      Peak postprandial:   less than 180 mg/dL (1-2 hours)      Critically ill patients:  140 - 180 mg/dL   Lab Results  Component Value Date   GLUCAP 227 (H) 05/11/2017   HGBA1C 13.1 (H) 05/09/2017   Review of Glycemic Control  Diabetes history: DM 2 Current orders for Inpatient glycemic control: Levemir 15 units BID, Novolog Resistant Correction 0-20 units tid  Inpatient Diabetes Program Recommendations:    Glucose trend in 200's still after Levemir 15 units BID (30 units total of basal insulin). Patient without insurance. Consider changing patient to 70/30 insulin 25 units BID (basal insulin equivalent 35 units, short acting equivalent 15 units for correction and meals).  At time of d/c consider ReliOn Novolin 70/30 for $25/vial.  Thanks,  Tama Headings RN, MSN, BC-ADM, [facility] Inpatient Diabetes Coordinator Team Pager [phone] (8a-5p)

## 2017-05-11 NOTE — Progress Notes (Signed)
Pharmacy Antibiotic Note  Shaun Cummings is a 42 y.o. male admitted on 05/08/2017 with cellulitis  Pharmacy has been consulted for vancomycin dosing.  Plan: Vancomycin 1250mg  IV q8h VT as indicated  Height: 6' (182.9 cm) Weight: 240 lb (108.9 kg) IBW/kg (Calculated) : 77.6  Temp (24hrs), Avg:99.6 F (37.6 C), Min:98.3 F (36.8 C), Max:102.8 F (39.3 C)  Recent Labs  Lab 05/08/17 1711 05/08/17 1723 05/09/17 0856 05/10/17 0628 05/11/17 0903 05/11/17 1130  WBC 18.3*  --   --  18.2* 19.8*  --   CREATININE 0.84  --  0.88 0.73 0.63  --   LATICACIDVEN  --  1.37  --   --   --   --   VANCOTROUGH  --   --   --   --   --  7*    Estimated Creatinine Clearance: 158 mL/min (by C-G formula based on SCr of 0.63 mg/dL).    Allergies  Allergen Reactions  . Augmentin [Amoxicillin-Pot Clavulanate] Hives and Itching    Has patient had a PCN reaction causing immediate rash, facial/tongue/throat swelling, SOB or lightheadedness with hypotension: Yes Has patient had a PCN reaction causing severe rash involving mucus membranes or skin necrosis: No Has patient had a PCN reaction that required hospitalization: No Has patient had a PCN reaction occurring within the last 10 years: No If all of the above answers are "NO", then may proceed with Cephalosporin use.    Antimicrobials this admission: Vancomycin 4/13>> Cefepime 4/15>>   Dose adjustments this admission: VT 7 on 4/16, Increase to 1250mg  IV q8h  Microbiology results: 4/13 BCx: NGTD 4/14 Abscess: GPC, GPR, GNR   Thank you for allowing pharmacy to be a part of this patient's care.  Betania Dizon A. Levada Dy, PharmD, [place] Pager: [phone]  05/11/2017 12:59 PM

## 2017-05-11 NOTE — Progress Notes (Signed)
Inpatient Diabetes Program Recommendations  AACE/ADA: New Consensus Statement on Inpatient Glycemic Control (2015)  Target Ranges:  Prepandial:   less than 140 mg/dL      Peak postprandial:   less than 180 mg/dL (1-2 hours)      Critically ill patients:  140 - 180 mg/dL   Lab Results  Component Value Date   GLUCAP 207 (H) 05/11/2017   HGBA1C 13.1 (H) 05/09/2017    Dm Coordinator following up with patient with drawing up and administering insulin via vial and syringe. Patient reports he was doing well with drawing up insulin. Patient laying in bed with eyes shut. Patient is hurting and says he will be going to the OR again tomorrow. Told patient if he needed review to let us know.  Thanks,  Tama Headings RN, MSN, BC-ADM, [facility] Inpatient Diabetes Coordinator Team Pager [phone] (8a-5p)

## 2017-05-11 NOTE — Progress Notes (Addendum)
Brief nutrition note:   RD consulted for diabetes diet education. On visit today, pt in a lot of pain/not comfortable due to scrotal abscess. Pt to go to OR tomorrow for washout and counter incision. Written material on "Carb Counting for People with Diabetes" left with pt but pt requesting RD return post op, hopefully when pain is improved, to provide diet education. RD will follow-up as able and provide diet education, assess for nutritional needs as needed.   Kerman Passey MS, RD, [place], [place] [phone] Pager  [phone] Weekend/On-Call Pager

## 2017-05-11 NOTE — Progress Notes (Signed)
IN to speak with patient regarding establishing PCP. NCM advised patient was able to get an earlier appointment with [facility] on 05[facility] at 9:15 am, requires $25 copay, patient declined. Prefers to keep appointment w/ [facility] on 06/09/2017.

## 2017-05-11 NOTE — H&P (View-Only) (Signed)
Patient ID: Shaun Cummings, male   DOB: [birth year], 42 y.o.   MRN: [id number]    2 Days Post-Op  Subjective: Patient is a lot of pain today.  Complains of worsening pain today along with worsening edema of his scrotum and peroneum.  Fever overnight of 102.8.  Objective: Vital signs in last 24 hours: Temp:  [98.4 F (36.9 C)-102.8 F (39.3 C)] 98.6 F (37 C) (04/16 0552) Pulse Rate:  [87-96] 93 (04/16 0552) Resp:  [18] 18 (04/16 0552) BP: (112-128)/(64-76) 118/76 (04/16 0552) SpO2:  [92 %-93 %] 92 % (04/16 0552) Weight:  [108.9 kg (240 lb)] 108.9 kg (240 lb) (04/15 2021) Last BM Date: 05/08/17  Intake/Output from previous day: 04/15 0701 - 04/16 0700 In: 1401.3 [P.O.:540; I.V.:461.3; IV Piggyback:400] Out: -  Intake/Output this shift: No intake/output data recorded.  PE: GU: right groin site is clean with no drainage noted; however, his peroneum and scrotum are more edematous today.  Lab Results:  Recent Labs    05/08/17 1711 05/10/17 0628  WBC 18.3* 18.2*  HGB 15.7 12.8*  HCT 44.1 37.5*  PLT 338 306   BMET Recent Labs    05/09/17 0856 05/10/17 0628  NA 132* 135  K 4.2 3.9  CL 101 101  CO2 21* 23  GLUCOSE 292* 271*  BUN 9 12  CREATININE 0.88 0.73  CALCIUM 8.3* 8.7*   PT/INR No results for input(s): LABPROT, INR in the last 72 hours. CMP     Component Value Date/Time   NA 135 05/10/2017 0628   K 3.9 05/10/2017 0628   CL 101 05/10/2017 0628   CO2 23 05/10/2017 0628   GLUCOSE 271 (H) 05/10/2017 0628   BUN 12 05/10/2017 0628   CREATININE 0.73 05/10/2017 0628   CREATININE 0.91 09/15/2013 1727   CALCIUM 8.7 (L) 05/10/2017 0628   PROT 8.4 (H) 05/08/2017 1711   ALBUMIN 3.5 05/08/2017 1711   AST 14 (L) 05/08/2017 1711   ALT 16 (L) 05/08/2017 1711   ALKPHOS 108 05/08/2017 1711   BILITOT 1.0 05/08/2017 1711   GFRNONAA >60 05/10/2017 0628   GFRAA >60 05/10/2017 0628   Lipase  No results found for: LIPASE     Studies/Results: No results  found.  Anti-infectives: Anti-infectives (From admission, onward)   Start     Dose/Rate Route Frequency Ordered Stop   05/11/17 0400  vancomycin (VANCOCIN) IVPB 1000 mg/200 mL premix     1,000 mg 200 mL/hr over 60 Minutes Intravenous Every 8 hours 05/10/17 1933     05/10/17 2000  ceFEPIme (MAXIPIME) 1 g in sodium chloride 0.9 % 100 mL IVPB     1 g 200 mL/hr over 30 Minutes Intravenous Every 8 hours 05/10/17 1933     05/10/17 1530  ceFEPIme (MAXIPIME) 1 g in sodium chloride 0.9 % 100 mL IVPB  Status:  Discontinued     1 g 200 mL/hr over 30 Minutes Intravenous Every 8 hours 05/10/17 1420 05/10/17 1933   05/09/17 1400  ceFEPIme (MAXIPIME) 1 g in sodium chloride 0.9 % 100 mL IVPB  Status:  Discontinued     1 g 200 mL/hr over 30 Minutes Intravenous Every 8 hours 05/09/17 1201 05/10/17 1420   05/09/17 0800  clindamycin (CLEOCIN) IVPB 900 mg  Status:  Discontinued     900 mg 100 mL/hr over 30 Minutes Intravenous To ShortStay Surgical 05/09/17 0335 05/09/17 1014   05/09/17 0600  vancomycin (VANCOCIN) IVPB 1000 mg/200 mL premix  Status:  Discontinued  1,000 mg 200 mL/hr over 60 Minutes Intravenous Every 8 hours 05/08/17 1850 05/10/17 1933   05/08/17 1900  ceFEPIme (MAXIPIME) 2 g in sodium chloride 0.9 % 100 mL IVPB     2 g 200 mL/hr over 30 Minutes Intravenous  Once 05/08/17 1847 05/08/17 2022   05/08/17 1845  piperacillin-tazobactam (ZOSYN) IVPB 3.375 g  Status:  Discontinued     3.375 g 100 mL/hr over 30 Minutes Intravenous  Once 05/08/17 1834 05/08/17 1834   05/08/17 1845  vancomycin (VANCOCIN) 2,000 mg in sodium chloride 0.9 % 500 mL IVPB     2,000 mg 250 mL/hr over 120 Minutes Intravenous  Once 05/08/17 1835 05/08/17 2335   05/08/17 1830  piperacillin-tazobactam (ZOSYN) IVPB 4.5 g  Status:  Discontinued     4.5 g 200 mL/hr over 30 Minutes Intravenous  Once 05/08/17 1829 05/08/17 1833       Assessment/Plan Right Inguinal Abscess, POD 2, s/p I&D  -CX :  Strep B     -WBC up  to 19.8.  He has more pain today along with more edema of his scrotum and peroneum. -plan to return to OR tomorrow for a counter incision and washout  -agree with ID consult for abx recommendation -NPO p MN for OR tomorrow  FEN - carb mod diet/NPO p MN VTE - SCDs/could start chemical prophylaxis from our standpoint, but will now need to hold until after OR tomorrow ID - Vanc/cefepime    LOS: 2 days    Henreitta Cea , [facility] Surgery 05/11/2017, 9:05 AM Pager: [phone]

## 2017-05-11 NOTE — Progress Notes (Signed)
Patient ID: Shaun Cummings, male   DOB: 02/10/1975, 42 y.o.   MRN: [id number]    2 Days Post-Op  Subjective: Patient is a lot of pain today.  Complains of worsening pain today along with worsening edema of his scrotum and peroneum.  Fever overnight of 102.8.  Objective: Vital signs in last 24 hours: Temp:  [98.4 F (36.9 C)-102.8 F (39.3 C)] 98.6 F (37 C) (04/16 0552) Pulse Rate:  [87-96] 93 (04/16 0552) Resp:  [18] 18 (04/16 0552) BP: (112-128)/(64-76) 118/76 (04/16 0552) SpO2:  [92 %-93 %] 92 % (04/16 0552) Weight:  [108.9 kg (240 lb)] 108.9 kg (240 lb) (04/15 2021) Last BM Date: 05/08/17  Intake/Output from previous day: 04/15 0701 - 04/16 0700 In: 1401.3 [P.O.:540; I.V.:461.3; IV Piggyback:400] Out: -  Intake/Output this shift: No intake/output data recorded.  PE: GU: right groin site is clean with no drainage noted; however, his peroneum and scrotum are more edematous today.  Lab Results:  Recent Labs    05/08/17 1711 05/10/17 0628  WBC 18.3* 18.2*  HGB 15.7 12.8*  HCT 44.1 37.5*  PLT 338 306   BMET Recent Labs    05/09/17 0856 05/10/17 0628  NA 132* 135  K 4.2 3.9  CL 101 101  CO2 21* 23  GLUCOSE 292* 271*  BUN 9 12  CREATININE 0.88 0.73  CALCIUM 8.3* 8.7*   PT/INR No results for input(s): LABPROT, INR in the last 72 hours. CMP     Component Value Date/Time   NA 135 05/10/2017 0628   K 3.9 05/10/2017 0628   CL 101 05/10/2017 0628   CO2 23 05/10/2017 0628   GLUCOSE 271 (H) 05/10/2017 0628   BUN 12 05/10/2017 0628   CREATININE 0.73 05/10/2017 0628   CREATININE 0.91 09/15/2013 1727   CALCIUM 8.7 (L) 05/10/2017 0628   PROT 8.4 (H) 05/08/2017 1711   ALBUMIN 3.5 05/08/2017 1711   AST 14 (L) 05/08/2017 1711   ALT 16 (L) 05/08/2017 1711   ALKPHOS 108 05/08/2017 1711   BILITOT 1.0 05/08/2017 1711   GFRNONAA >60 05/10/2017 0628   GFRAA >60 05/10/2017 0628   Lipase  No results found for: LIPASE     Studies/Results: No results  found.  Anti-infectives: Anti-infectives (From admission, onward)   Start     Dose/Rate Route Frequency Ordered Stop   05/11/17 0400  vancomycin (VANCOCIN) IVPB 1000 mg/200 mL premix     1,000 mg 200 mL/hr over 60 Minutes Intravenous Every 8 hours 05/10/17 1933     05/10/17 2000  ceFEPIme (MAXIPIME) 1 g in sodium chloride 0.9 % 100 mL IVPB     1 g 200 mL/hr over 30 Minutes Intravenous Every 8 hours 05/10/17 1933     05/10/17 1530  ceFEPIme (MAXIPIME) 1 g in sodium chloride 0.9 % 100 mL IVPB  Status:  Discontinued     1 g 200 mL/hr over 30 Minutes Intravenous Every 8 hours 05/10/17 1420 05/10/17 1933   05/09/17 1400  ceFEPIme (MAXIPIME) 1 g in sodium chloride 0.9 % 100 mL IVPB  Status:  Discontinued     1 g 200 mL/hr over 30 Minutes Intravenous Every 8 hours 05/09/17 1201 05/10/17 1420   05/09/17 0800  clindamycin (CLEOCIN) IVPB 900 mg  Status:  Discontinued     900 mg 100 mL/hr over 30 Minutes Intravenous To ShortStay Surgical 05/09/17 0335 05/09/17 1014   05/09/17 0600  vancomycin (VANCOCIN) IVPB 1000 mg/200 mL premix  Status:  Discontinued  1,000 mg 200 mL/hr over 60 Minutes Intravenous Every 8 hours 05/08/17 1850 05/10/17 1933   05/08/17 1900  ceFEPIme (MAXIPIME) 2 g in sodium chloride 0.9 % 100 mL IVPB     2 g 200 mL/hr over 30 Minutes Intravenous  Once 05/08/17 1847 05/08/17 2022   05/08/17 1845  piperacillin-tazobactam (ZOSYN) IVPB 3.375 g  Status:  Discontinued     3.375 g 100 mL/hr over 30 Minutes Intravenous  Once 05/08/17 1834 05/08/17 1834   05/08/17 1845  vancomycin (VANCOCIN) 2,000 mg in sodium chloride 0.9 % 500 mL IVPB     2,000 mg 250 mL/hr over 120 Minutes Intravenous  Once 05/08/17 1835 05/08/17 2335   05/08/17 1830  piperacillin-tazobactam (ZOSYN) IVPB 4.5 g  Status:  Discontinued     4.5 g 200 mL/hr over 30 Minutes Intravenous  Once 05/08/17 1829 05/08/17 1833       Assessment/Plan Right Inguinal Abscess, POD 2, s/p I&D  -CX :  Strep B     -WBC up  to 19.8.  He has more pain today along with more edema of his scrotum and peroneum. -plan to return to OR tomorrow for a counter incision and washout  -agree with ID consult for abx recommendation -NPO p MN for OR tomorrow  FEN - carb mod diet/NPO p MN VTE - SCDs/could start chemical prophylaxis from our standpoint, but will now need to hold until after OR tomorrow ID - Vanc/cefepime    LOS: 2 days    Henreitta Cea , [facility] 05/11/2017, 9:05 AM Pager: [phone]

## 2017-05-11 NOTE — Progress Notes (Signed)
PROGRESS NOTE    Shaun Cummings  ZOX:[id number] DOB: 1975/02/12 DOA: 05/08/2017 PCP: System, Pcp Not In    Brief Narrative:  42 year old male who presented withgroin pain. Patient does have the significant past medical history for phimosis and type 2 diabetes mellitus. Patient complained of 3 days of worsening groin pain, associated with genital swelling and localerythema. Worsening symptoms to the point where he was having difficulty ambulating. On the initial physical examination blood pressure 140/90, heart rate 95, oxygen saturation 100%. His membranes, lungs were clear to auscultation bilaterally, heart sounds are present and rhythmic, abdomen was soft nontender.Positive severe genital swelling, associated with erythema, and a scrotal fluctuant mass, tender to palpation.Sodium 133, potassium 3.8, chloride 98, bicarb 21, glucose 241, BUN 13, creatinine 0.84, white count 18.3, hemoglobin 15.7, hematocrit 44.1, platelets 338,urinalysis greater than 500 glucose,pH 5.0, specific gravity1.039,0-5 white cells, 0-5 RBCs.CT of the abdomen with 3.3 x 2.4 cm high right perineal/scrotal abscess. Associated scrotal wall thickening/edema suggesting cellulitis. No soft tissue gas to suggest necrotizing fasciitis. 2 nonobstructing left renal calculi measuring up to 2 mm. No hydronephrosis. EKG was sinus rhythm, normal axis, normal intervals.  Patient was admitted to the hospitalwith theworking diagnosis of right scrotal abscess complicated by sepsis,and uncontrolled diabetes mellitus.   Assessment & Plan:   Active Problems:   Scrotal abscess   Sepsis ([place])  1.Sepsis due to right scrotal abscess.Today with worsening edema and tenderness, worsening leukocytosis to 19,8, will continue antibiotic therapy with IV cefepime and IV Vancomycin, cultures positive for group B streptococcus. Plan for second surgical intervention in am, will wait for further cultures and clinical response  before de-escalating antibiotic therapy. Will continue hydration with balanced electrolyte solutions IV at 50 ml per hour, avoid volume overload, follow a restrictive IV fluids strategy. Continue pain control with ketorolac, oxycodone and IV morphine.   2.Uncontrolled type 2 diabetes mellitus. Yesterday added basal long acting insulin 15 units bid, capillary glucose 276, 264, 250, 227, 210, will continue insulin sliding scale for glucose cover and monitoring, will continue to calculate insulin requirements and further insulin therapy adjustment, will target for tight control in the setting of scrotal abcess.   3. Obesity.  BMI 32. Will need close follow up as outpatient, physical therapy before discharge.  4, Hypokalemia. New hypokalemia, serum K at 2,8, will correct with oral Kcl 80 meq in 2 divided dose, follow on renal panel in am, renal function preserved with serum cr at 0.63, continue gentle hydration with lactate ringers.   DVT prophylaxis:enoxaparin Code Status:full Family Communication:no family at the bedside   Disposition Plan:home when clinically stable   Consultants:  surgery  Procedures:  Scrotal I&D  Antimicrobials:  IV vancomycin   IV cefepime   Subjective: Patient developed worsening genital edema and pain, severe in intensity, worse with movement and associated with erythema, no nausea or vomiting, no radiation. Positive bowel movement this am.   Objective: Vitals:   05/10/17 1915 05/10/17 2021 05/11/17 0552 05/11/17 0922  BP:  128/74 118/76 132/79  Pulse:  96 93 89  Resp:  18 18 18   Temp: (!) 102.8 F (39.3 C) 98.5 F (36.9 C) 98.6 F (37 C) 98.3 F (36.8 C)  TempSrc: Oral Oral Oral Oral  SpO2:  93% 92% (!) 84%  Weight:  108.9 kg (240 lb)    Height:        Intake/Output Summary (Last 24 hours) at 05/11/2017 1226 Last data filed at 05/11/2017 0921 Gross per 24 hour  Intake 940  ml  Output 0 ml  Net 940 ml   Filed Weights    05/09/17 0259 05/09/17 2124 05/10/17 2021  Weight: 108.9 kg (240 lb) 108.8 kg (239 lb 13.8 oz) 108.9 kg (240 lb)    Examination:   General: Not in pain or dyspnea, deconditioned Neurology: Awake and alert, non focal  E ENT: mild pallor, no icterus, oral mucosa moist Cardiovascular: No JVD. S1-S2 present, rhythmic, no gallops, rubs, or murmurs. No lower extremity edema. Pulmonary: vesicular breath sounds bilaterally, adequate air movement, no wheezing, rhonchi or rales. Gastrointestinal. Abdomen with no organomegaly, non tender, no rebound or guarding Skin. Scrotal erythema Musculoskeletal: no joint deformities Significant scrotal edema and erythema, tender to palpation.      Data Reviewed: I have personally reviewed following labs and imaging studies  CBC: Recent Labs  Lab 05/08/17 1711 05/10/17 0628 05/11/17 0903  WBC 18.3* 18.2* 19.8*  NEUTROABS 13.5* 13.8* 14.6*  HGB 15.7 12.8* 11.6*  HCT 44.1 37.5* 33.3*  MCV 85.0 87.2 84.3  PLT 338 306 384   Basic Metabolic Panel: Recent Labs  Lab 05/08/17 1711 05/09/17 0856 05/10/17 0628 05/11/17 0903  NA 133* 132* 135 133*  K 3.8 4.2 3.9 2.8*  CL 98* 101 101 99*  CO2 21* 21* 23 22  GLUCOSE 241* 292* 271* 181*  BUN 13 9 12 9   CREATININE 0.84 0.88 0.73 0.63  CALCIUM 9.5 8.3* 8.7* 8.1*   GFR: Estimated Creatinine Clearance: 158 mL/min (by C-G formula based on SCr of 0.63 mg/dL). Liver Function Tests: Recent Labs  Lab 05/08/17 1711  AST 14*  ALT 16*  ALKPHOS 108  BILITOT 1.0  PROT 8.4*  ALBUMIN 3.5   No results for input(s): LIPASE, AMYLASE in the last 168 hours. No results for input(s): AMMONIA in the last 168 hours. Coagulation Profile: No results for input(s): INR, PROTIME in the last 168 hours. Cardiac Enzymes: No results for input(s): CKTOTAL, CKMB, CKMBINDEX, TROPONINI in the last 168 hours. BNP (last 3 results) No results for input(s): PROBNP in the last 8760 hours. HbA1C: Recent Labs     05/09/17 0857  HGBA1C 13.1*   CBG: Recent Labs  Lab 05/10/17 1147 05/10/17 1647 05/10/17 2020 05/11/17 0800 05/11/17 1110  GLUCAP 276* 264* 250* 227* 210*   Lipid Profile: No results for input(s): CHOL, HDL, LDLCALC, TRIG, CHOLHDL, LDLDIRECT in the last 72 hours. Thyroid Function Tests: No results for input(s): TSH, T4TOTAL, FREET4, T3FREE, THYROIDAB in the last 72 hours. Anemia Panel: No results for input(s): VITAMINB12, FOLATE, FERRITIN, TIBC, IRON, RETICCTPCT in the last 72 hours.    Radiology Studies: I have reviewed all of the imaging during this hospital visit personally     Scheduled Meds: . Chlorhexidine Gluconate Cloth  6 each Topical Daily  . insulin aspart  0-20 Units Subcutaneous TID WC  . insulin detemir  15 Units Subcutaneous BID  . mupirocin ointment  1 application Nasal BID  . pantoprazole  40 mg Oral Daily  . potassium chloride  40 mEq Oral Q4H   Continuous Infusions: . ceFEPime (MAXIPIME) IV Stopped (05/11/17 1220)  . lactated ringers 50 mL/hr at 05/11/17 0410  . vancomycin Stopped (05/11/17 0516)     LOS: 2 days        Amylynn Fano Gerome Apley, MD [facility] Pager [phone]

## 2017-05-12 ENCOUNTER — Encounter (HOSPITAL_COMMUNITY): Payer: Self-pay | Admitting: Anesthesiology

## 2017-05-12 ENCOUNTER — Inpatient Hospital Stay (HOSPITAL_COMMUNITY): Payer: Medicaid Other | Admitting: Anesthesiology

## 2017-05-12 ENCOUNTER — Encounter (HOSPITAL_COMMUNITY)
Admission: EM | Disposition: A | Discharge status: Home / Self Care | Payer: Self-pay | Source: Home / Self Care | Attending: Internal Medicine

## 2017-05-12 DIAGNOSIS — E876 Hypokalemia: Secondary | ICD-10-CM | POA: Clinically undetermined

## 2017-05-12 HISTORY — PX: INCISION AND DRAINAGE ABSCESS: SHX5864

## 2017-05-12 LAB — CBC WITH DIFFERENTIAL/PLATELET
Band Neutrophils: 24 %
Basophils Absolute: 0 10*3/uL (ref 0.0–0.1)
Basophils Relative: 0 %
Blasts: 0 %
EOS PCT: 0 %
Eosinophils Absolute: 0 10*3/uL (ref 0.0–0.7)
HEMATOCRIT: 34.9 % — AB (ref 39.0–52.0)
HEMOGLOBIN: 12 g/dL — AB (ref 13.0–17.0)
LYMPHS ABS: 4.2 10*3/uL — AB (ref 0.7–4.0)
Lymphocytes Relative: 21 %
MCH: 29.3 pg (ref 26.0–34.0)
MCHC: 34.4 g/dL (ref 30.0–36.0)
MCV: 85.1 fL (ref 78.0–100.0)
MONOS PCT: 7 %
MYELOCYTES: 0 %
Metamyelocytes Relative: 0 %
Monocytes Absolute: 1.4 10*3/uL — ABNORMAL HIGH (ref 0.1–1.0)
NEUTROS ABS: 14.5 10*3/uL — AB (ref 1.7–7.7)
NEUTROS PCT: 48 %
NRBC: 0 /100{WBCs}
Other: 0 %
Platelets: 319 10*3/uL (ref 150–400)
Promyelocytes Relative: 0 %
RBC: 4.1 MIL/uL — AB (ref 4.22–5.81)
RDW: 11.9 % (ref 11.5–15.5)
WBC Morphology: INCREASED
WBC: 20.1 10*3/uL — AB (ref 4.0–10.5)

## 2017-05-12 LAB — BASIC METABOLIC PANEL
ANION GAP: 10 (ref 5–15)
BUN: 8 mg/dL (ref 6–20)
CALCIUM: 7.8 mg/dL — AB (ref 8.9–10.3)
CO2: 22 mmol/L (ref 22–32)
Chloride: 100 mmol/L — ABNORMAL LOW (ref 101–111)
Creatinine, Ser: 0.64 mg/dL (ref 0.61–1.24)
GFR calc non Af Amer: >60 mL/min (ref >60)
GLUCOSE: 165 mg/dL — AB (ref 65–99)
Potassium: 3.3 mmol/L — ABNORMAL LOW (ref 3.5–5.1)
Sodium: 132 mmol/L — ABNORMAL LOW (ref 135–145)

## 2017-05-12 LAB — GLUCOSE, CAPILLARY
GLUCOSE-CAPILLARY: 310 mg/dL — AB (ref 65–99)
Glucose-Capillary: 174 mg/dL — ABNORMAL HIGH (ref 65–99)
Glucose-Capillary: 202 mg/dL — ABNORMAL HIGH (ref 65–99)
Glucose-Capillary: 343 mg/dL — ABNORMAL HIGH (ref 65–99)

## 2017-05-12 LAB — MAGNESIUM: Magnesium: 1.7 mg/dL (ref 1.7–2.4)

## 2017-05-12 SURGERY — INCISION AND DRAINAGE, ABSCESS
Anesthesia: General | Site: Groin | Laterality: Right

## 2017-05-12 MED ORDER — ONDANSETRON HCL 4 MG/2ML IJ SOLN
INTRAMUSCULAR | Status: AC
  Filled 2017-05-12: qty 2

## 2017-05-12 MED ORDER — DEXAMETHASONE SODIUM PHOSPHATE 10 MG/ML IJ SOLN
INTRAMUSCULAR | Status: AC
  Filled 2017-05-12: qty 1

## 2017-05-12 MED ORDER — BUPIVACAINE-EPINEPHRINE (PF) 0.25% -1:200000 IJ SOLN
INTRAMUSCULAR | Status: AC
  Filled 2017-05-12: qty 30

## 2017-05-12 MED ORDER — MIDAZOLAM HCL 5 MG/5ML IJ SOLN
INTRAMUSCULAR | Status: DC | PRN
  Administered 2017-05-12: 2 mg via INTRAVENOUS

## 2017-05-12 MED ORDER — DEXAMETHASONE SODIUM PHOSPHATE 10 MG/ML IJ SOLN
INTRAMUSCULAR | Status: DC | PRN
  Administered 2017-05-12: 4 mg via INTRAVENOUS

## 2017-05-12 MED ORDER — MIDAZOLAM HCL 2 MG/2ML IJ SOLN
INTRAMUSCULAR | Status: AC
  Filled 2017-05-12: qty 2

## 2017-05-12 MED ORDER — PROMETHAZINE HCL 25 MG/ML IJ SOLN: 6.2500 mg | INTRAMUSCULAR | Status: DC | PRN

## 2017-05-12 MED ORDER — LIDOCAINE 2% (20 MG/ML) 5 ML SYRINGE
INTRAMUSCULAR | Status: AC
  Filled 2017-05-12: qty 5

## 2017-05-12 MED ORDER — FENTANYL CITRATE (PF) 250 MCG/5ML IJ SOLN
INTRAMUSCULAR | Status: AC
  Filled 2017-05-12: qty 5

## 2017-05-12 MED ORDER — LACTATED RINGERS IV SOLN
INTRAVENOUS | Status: DC | PRN
  Administered 2017-05-12: 08:00:00 via INTRAVENOUS

## 2017-05-12 MED ORDER — FENTANYL CITRATE (PF) 250 MCG/5ML IJ SOLN
INTRAMUSCULAR | Status: DC | PRN
  Administered 2017-05-12 (×7): 50 ug via INTRAVENOUS

## 2017-05-12 MED ORDER — GABAPENTIN 300 MG PO CAPS
300.0000 mg | ORAL_CAPSULE | Freq: Three times a day (TID) | ORAL | Status: DC
  Administered 2017-05-12 – 2017-05-14 (×8): 300 mg via ORAL
  Filled 2017-05-12 (×8): qty 1

## 2017-05-12 MED ORDER — ROCURONIUM BROMIDE 10 MG/ML (PF) SYRINGE
PREFILLED_SYRINGE | INTRAVENOUS | Status: AC
  Filled 2017-05-12: qty 5

## 2017-05-12 MED ORDER — LACTATED RINGERS IV SOLN: INTRAVENOUS | Status: DC

## 2017-05-12 MED ORDER — MAGNESIUM SULFATE 4 GM/100ML IV SOLN
4.0000 g | Freq: Once | INTRAVENOUS | Status: AC
  Administered 2017-05-12: 4 g via INTRAVENOUS
  Filled 2017-05-12 (×2): qty 100

## 2017-05-12 MED ORDER — MEPERIDINE HCL 50 MG/ML IJ SOLN: 6.2500 mg | INTRAMUSCULAR | Status: DC | PRN

## 2017-05-12 MED ORDER — POTASSIUM CHLORIDE CRYS ER 20 MEQ PO TBCR
40.0000 meq | EXTENDED_RELEASE_TABLET | Freq: Once | ORAL | Status: AC
  Administered 2017-05-12: 40 meq via ORAL
  Filled 2017-05-12: qty 2

## 2017-05-12 MED ORDER — ONDANSETRON HCL 4 MG/2ML IJ SOLN
INTRAMUSCULAR | Status: DC | PRN
  Administered 2017-05-12: 4 mg via INTRAVENOUS

## 2017-05-12 MED ORDER — HYDROMORPHONE HCL 2 MG/ML IJ SOLN
INTRAMUSCULAR | Status: AC
  Administered 2017-05-12: 0.5 mg via INTRAVENOUS
  Filled 2017-05-12: qty 1

## 2017-05-12 MED ORDER — PROPOFOL 10 MG/ML IV BOLUS
INTRAVENOUS | Status: DC | PRN
  Administered 2017-05-12: 200 mg via INTRAVENOUS

## 2017-05-12 MED ORDER — LIDOCAINE 2% (20 MG/ML) 5 ML SYRINGE
INTRAMUSCULAR | Status: DC | PRN
  Administered 2017-05-12: 60 mg via INTRAVENOUS

## 2017-05-12 MED ORDER — 0.9 % SODIUM CHLORIDE (POUR BTL) OPTIME
TOPICAL | Status: DC | PRN
  Administered 2017-05-12 (×2): 1000 mL

## 2017-05-12 MED ORDER — HYDROMORPHONE HCL 2 MG/ML IJ SOLN
0.2500 mg | INTRAMUSCULAR | Status: DC | PRN
  Administered 2017-05-12 (×2): 0.5 mg via INTRAVENOUS

## 2017-05-12 MED ORDER — MORPHINE SULFATE (PF) 2 MG/ML IV SOLN: 2.0000 mg | INTRAVENOUS | Status: DC | PRN

## 2017-05-12 MED ORDER — PROPOFOL 10 MG/ML IV BOLUS
INTRAVENOUS | Status: AC
  Filled 2017-05-12: qty 40

## 2017-05-12 SURGICAL SUPPLY — 34 items
BNDG GAUZE ELAST 4 BULKY (GAUZE/BANDAGES/DRESSINGS) ×2 IMPLANT
CANISTER SUCT 3000ML PPV (MISCELLANEOUS) ×3 IMPLANT
COVER SURGICAL LIGHT HANDLE (MISCELLANEOUS) ×3 IMPLANT
DRAIN PENROSE 1X12 LTX STRL (DRAIN) ×2 IMPLANT
DRAPE LAPAROSCOPIC ABDOMINAL (DRAPES) ×3 IMPLANT
DRAPE ORTHO SPLIT 87X125 STRL (DRAPES) ×2 IMPLANT
DRAPE UTILITY XL STRL (DRAPES) ×4 IMPLANT
DRSG PAD ABDOMINAL 8X10 ST (GAUZE/BANDAGES/DRESSINGS) IMPLANT
ELECT CAUTERY BLADE 6.4 (BLADE) ×3 IMPLANT
ELECT REM PT RETURN 9FT ADLT (ELECTROSURGICAL) ×3
ELECTRODE REM PT RTRN 9FT ADLT (ELECTROSURGICAL) ×1 IMPLANT
GAUZE SPONGE 4X4 12PLY STRL (GAUZE/BANDAGES/DRESSINGS) IMPLANT
GAUZE SPONGE 4X4 12PLY STRL LF (GAUZE/BANDAGES/DRESSINGS) ×2 IMPLANT
GLOVE BIO SURGEON STRL SZ8 (GLOVE) ×3 IMPLANT
GLOVE BIOGEL PI IND STRL 8 (GLOVE) ×1 IMPLANT
GLOVE BIOGEL PI INDICATOR 8 (GLOVE) ×2
GLOVE ECLIPSE 7.0 STRL STRAW (GLOVE) ×2 IMPLANT
GOWN STRL REUS W/ TWL LRG LVL3 (GOWN DISPOSABLE) ×1 IMPLANT
GOWN STRL REUS W/ TWL XL LVL3 (GOWN DISPOSABLE) ×1 IMPLANT
GOWN STRL REUS W/TWL LRG LVL3 (GOWN DISPOSABLE) ×3
GOWN STRL REUS W/TWL XL LVL3 (GOWN DISPOSABLE) ×3
KIT BASIN OR (CUSTOM PROCEDURE TRAY) ×3 IMPLANT
KIT TURNOVER KIT B (KITS) ×3 IMPLANT
LEGGING LITHOTOMY PAIR STRL (DRAPES) ×2 IMPLANT
NS IRRIG 1000ML POUR BTL (IV SOLUTION) ×3 IMPLANT
PACK GENERAL/GYN (CUSTOM PROCEDURE TRAY) ×3 IMPLANT
PAD ARMBOARD 7.5X6 YLW CONV (MISCELLANEOUS) ×4 IMPLANT
SPECIMEN JAR SMALL (MISCELLANEOUS) IMPLANT
SUT ETHILON 2 0 FS 18 (SUTURE) ×2 IMPLANT
SWAB COLLECTION DEVICE MRSA (MISCELLANEOUS) IMPLANT
SWAB CULTURE ESWAB REG 1ML (MISCELLANEOUS) IMPLANT
TAPE CLOTH SURG 6X10 WHT LF (GAUZE/BANDAGES/DRESSINGS) ×2 IMPLANT
TOWEL OR 17X24 6PK STRL BLUE (TOWEL DISPOSABLE) ×3 IMPLANT
TOWEL OR 17X26 10 PK STRL BLUE (TOWEL DISPOSABLE) ×1 IMPLANT

## 2017-05-12 NOTE — Op Note (Signed)
Preoperative diagnosis: Right inguinal scrotal abscess complex  Postoperative diagnosis: Same  Procedure: Incision and drainage of complex right inguinal  scrotal abscess complex  Surgeon: Thomas Cornett MD  Anesthesia: General  EBL: 30 cc  Specimen: None  Drains 1 inch Penrose  IV fluids: Per anesthesia record  Indications for procedure: Patient is a 41-year-old male admitted 4 days ago with a large right inguinal scrotal abscess.  This was drained on Saturday by Dr. Tsuei.  He had a Penrose drain in place and he had a single incision in the right inguinal region.  He is developed more swelling now of the lower inguinal scrotal region and more fluctuance.  He presents today for further drainage and possible debridement and washout today.  He has been running fever on antibiotics and I felt that there may be some undrained fluid at the bottom of this wound that would benefit from a counter incision and drainage placement.  I discussed this with the patient.  I discussed risk of bleeding, infection, further debridement, scrotal loss, testicular loss, chronic pain, death, DVT, myocardial infarction, and the need for major plastic reconstructive procedures and/or urological procedures.    Description of procedure: The patient was met in the holding area.  Questions were answered.  He was taken back to the operating room placed supine on the OR table.  After induction of general anesthesia he was placed in lithotomy and padded appropriately.  The perineum was prepped and draped in sterile fashion.  Timeout was done.  He was Artie on antibiotics.  He had significant swelling and redness of the right inguinal scrotal region down to the base of the inner perineum and buttock.  This abutted but did not appear to be involving the scrotum per se.  There was some ecchymotic skin changes to the very lateral portion of the scrotum.  I placed my finger through the previous I&D tract where the Penrose drain  was done.  This tracked all the way down to the bottom of the inguinal canal along the lateral aspect of the junction of the scrotum the skin.  I opened this up with a cautery.  I then used a Penrose drain and pulled a new Penrose drain from the bottom incision to the previous top incision in the inguinal scrotal region.  There is significant pus and undrained fluid in this wound.  This was washed out copiously.  I debrided a small portion of the scrotal skin but this was viable with no signs of Fournier's.  There is no invasive infection all tissue planes were intact.  After copious irrigation I secured the new Penrose drain with #1 nylon.  There is a small opening at the bottom wound that is packed with saline soaked Kerlix.  Hemostasis achieved.  Large bulky dressing in place.  All final counts were found to be correct.  The patient was then extubated taken out of lithotomy and transferred to PACU in satisfactory condition. 

## 2017-05-12 NOTE — Anesthesia Preprocedure Evaluation (Addendum)
Anesthesia Evaluation  Patient identified by MRN, date of birth, ID band Patient awake    Reviewed: Allergy & Precautions, NPO status , Patient's Chart, lab work & pertinent test results  Airway Mallampati: I  TM Distance: >3 FB Neck ROM: Full    Dental  (+) Teeth Intact, Dental Advisory Given   Pulmonary sleep apnea , former smoker,    breath sounds clear to auscultation       Cardiovascular negative cardio ROS   Rhythm:Regular Rate:Normal     Neuro/Psych  Neuromuscular disease negative psych ROS   GI/Hepatic negative GI ROS, Neg liver ROS,   Endo/Other  diabetes  Renal/GU negative Renal ROS  negative genitourinary   Musculoskeletal negative musculoskeletal ROS (+)   Abdominal Normal abdominal exam  (+)   Peds  Hematology negative hematology ROS (+)   Anesthesia Other Findings   Reproductive/Obstetrics negative OB ROS                            Lab Results  Component Value Date   WBC 20.1 (H) 05/12/2017   HGB 12.0 (L) 05/12/2017   HCT 34.9 (L) 05/12/2017   MCV 85.1 05/12/2017   PLT 319 05/12/2017   Lab Results  Component Value Date   CREATININE 0.63 05/11/2017   BUN 9 05/11/2017   NA 133 (L) 05/11/2017   K 2.8 (L) 05/11/2017   CL 99 (L) 05/11/2017   CO2 22 05/11/2017   No results found for: INR, PROTIME  EKG: normal sinus rhythm.   Anesthesia Physical Anesthesia Plan  ASA: II  Anesthesia Plan: General   Post-op Pain Management:    Induction: Intravenous  PONV Risk Score and Plan: 3 and Ondansetron, Dexamethasone and Midazolam  Airway Management Planned: LMA  Additional Equipment: None  Intra-op Plan:   Post-operative Plan: Extubation in OR  Informed Consent: I have reviewed the patients History and Physical, chart, labs and discussed the procedure including the risks, benefits and alternatives for the proposed anesthesia with the patient or authorized  representative who has indicated his/her understanding and acceptance.   Dental advisory given  Plan Discussed with: CRNA  Anesthesia Plan Comments:        Anesthesia Quick Evaluation

## 2017-05-12 NOTE — Anesthesia Postprocedure Evaluation (Signed)
Anesthesia Post Note  Patient: Shaun Cummings  Procedure(s) Performed: INCISION AND DRAINAGE RIGHT INGUINAL ABSCESS (Right Groin)     Patient location during evaluation: PACU Anesthesia Type: General Level of consciousness: awake and alert Vital Signs Assessment: post-procedure vital signs reviewed and stable Respiratory status: spontaneous breathing, nonlabored ventilation, respiratory function stable and patient connected to nasal cannula oxygen Cardiovascular status: blood pressure returned to baseline and stable Postop Assessment: no apparent nausea or vomiting Anesthetic complications: no    Last Vitals:  Vitals:   05/12/17 1025 05/12/17 1030  BP:  115/68  Pulse: 79 75  Resp: 18 19  Temp:    SpO2: 96% 92%    LLE Motor Response: Purposeful movement;Responds to commands (05/12/17 1030) LLE Sensation: Full sensation (05/12/17 1030) RLE Motor Response: Purposeful movement;Responds to commands (05/12/17 1030) RLE Sensation: Full sensation (05/12/17 1030)      Effie Berkshire

## 2017-05-12 NOTE — Interval H&P Note (Signed)
History and Physical Interval Note:  05/12/2017 8:03 AM  Shaun Cummings  has presented today for surgery, with the diagnosis of right inguinal abscess  The various methods of treatment have been discussed with the patient and family. After consideration of risks, benefits and other options for treatment, the patient has consented to  Procedure(s): INCISION AND DRAINAGE RIGHT INGUINAL ABSCESS (Right) as a surgical intervention .  The patient's history has been reviewed, patient examined, no change in status, stable for surgery.  I have reviewed the patient's chart and labs.  Questions were answered to the patient's satisfaction.     [place]

## 2017-05-12 NOTE — Transfer of Care (Signed)
Immediate Anesthesia Transfer of Care Note  Patient: Shaun Cummings  Procedure(s) Performed: INCISION AND DRAINAGE RIGHT INGUINAL ABSCESS (Right Groin)  Patient Location: PACU  Anesthesia Type:General  Level of Consciousness: sedated and patient cooperative  Airway & Oxygen Therapy: Patient Spontanous Breathing and Patient connected to face mask oxygen  Post-op Assessment: Report given to RN and Post -op Vital signs reviewed and stable  Post vital signs: Reviewed and stable  Last Vitals:  Vitals Value Taken Time  BP 89/54 05/12/2017  9:38 AM  Temp 36.7 C 05/12/2017  9:30 AM  Pulse 73 05/12/2017  9:39 AM  Resp 26 05/12/2017  9:39 AM  SpO2 97 % 05/12/2017  9:39 AM  Vitals shown include unvalidated device data.  Last Pain:  Vitals:   05/12/17 0534  TempSrc: Oral  PainSc:       Patients Stated Pain Goal: 0 (24/46/28 6381)  Complications: No apparent anesthesia complications

## 2017-05-12 NOTE — Anesthesia Procedure Notes (Signed)
Procedure Name: LMA Insertion Date/Time: 05/12/2017 8:37 AM Performed by: Renato Shin, CRNA Pre-anesthesia Checklist: Patient identified, Emergency Drugs available, Suction available and Patient being monitored Patient Re-evaluated:Patient Re-evaluated prior to induction Oxygen Delivery Method: Circle system utilized Preoxygenation: Pre-oxygenation with 100% oxygen Induction Type: IV induction LMA: LMA inserted LMA Size: 5.0 Number of attempts: 1 Placement Confirmation: positive ETCO2,  CO2 detector and breath sounds checked- equal and bilateral Tube secured with: Tape Dental Injury: Teeth and Oropharynx as per pre-operative assessment

## 2017-05-12 NOTE — Progress Notes (Signed)
PROGRESS NOTE    Shaun Cummings  AJO:[id number] DOB: [birth year] DOA: 05/08/2017 PCP: System, Pcp Not In   Brief Narrative:  42 year old male who presented withgroin pain. Patient does have the significant past medical history for phimosis and type 2 diabetes mellitus. Patient complained of 3 days of worsening groin pain, associated with genital swelling and localerythema. Worsening symptoms to the point where he was having difficulty ambulating. On the initial physical examination blood pressure 140/90, heart rate 95, oxygen saturation 100%. His membranes, lungs were clear to auscultation bilaterally, heart sounds are present and rhythmic, abdomen was soft nontender.Positive severe genital swelling, associated with erythema, and a scrotal fluctuant mass, tender to palpation.Sodium 133, potassium 3.8, chloride 98, bicarb 21, glucose 241, BUN 13, creatinine 0.84, white count 18.3, hemoglobin 15.7, hematocrit 44.1, platelets 338,urinalysis greater than 500 glucose,pH 5.0, specific gravity1.039,0-5 white cells, 0-5 RBCs.CT of the abdomen with 3.3 x 2.4 cm high right perineal/scrotal abscess. Associated scrotal wall thickening/edema suggesting cellulitis. No soft tissue gas to suggest necrotizing fasciitis. 2 nonobstructing left renal calculi measuring up to 2 mm. No hydronephrosis. EKG was sinus rhythm, normal axis, normal intervals.  Patient was admitted to the hospitalwith theworking diagnosis of right scrotal abscess complicated by sepsis,and uncontrolled diabetes mellitus.      Assessment & Plan:   Principal Problem:   Sepsis ([place]) Active Problems:   Scrotal abscess   DM (diabetes mellitus), type 2, uncontrolled (HCC)   Obesity (BMI 30-39.9)   Hypokalemia  #1 sepsis secondary to right scrotal abscess Patient status post sharp incision and drainage right groin/perineal/scrotal abscess by Dr.Tsuei 05/09/2017.  Patient however with worsening edema, tenderness and a  worsening leukocytosis that patient subsequently underwent incision and drainage of complex right inguinal scrotal abscess per Dr. Brantley Stage 05/12/2017 with pus noted.  Cultures from 05/09/2017 pending with preliminary culture showing moderate group B strep with final cultures pending.  Blood cultures negative.  Continue empiric IV vancomycin and IV cefepime.  Pain management.  Will consult with ID tomorrow for antibiotic recommendations and duration.  General surgery following and appreciate input and recommendations.  2.  Uncontrolled type 2 diabetes mellitus Hemoglobin A1c 13.1 05/09/2017.  CBGs ranging from 174-310.  Levemir was held this morning as patient went to the operating room.  Continue current regimen of Levemir and sliding scale insulin.  3.  Obesity Weight management.  Outpatient follow-up.  4.  Hypokalemia ?? etiology.  Magnesium level at 1.7.  Keep magnesium greater than 2.  Replete potassium.   DVT prophylaxis: SCDs Code Status: Full Family Communication: Updated patient.  No family at bedside. Disposition Plan: Home when clinically improved and per general surgery with resultant of cultures.   Consultants:   General surgery: Dr. Brantley Stage 05/08/2017  Procedures:  Scrotal ultrasound with Doppler 05/08/2017  CT abdomen and pelvis 05/08/2017  Incision and drainage of complex right inguinal scrotal abscess complex per Dr. Brantley Stage 05/12/2017  Sharp incision and drainage of right groin/perineal/scrotal abscess per Dr Georgette Dover  Antimicrobials:   IV cefepime 05/08/2017  IV vancomycin 05/08/2017   Subjective: Patient eating Jell-O.  Patient just returned from the operating room.  Denies any chest pain no shortness of breath.  States some improvement with scrotal pain.  Objective: Vitals:   05/12/17 1030 05/12/17 1034 05/12/17 1045 05/12/17 1104  BP: 115/68  112/65 106/72  Pulse: 75 73 77 74  Resp: 19 (!) 21 16 14   Temp:   98 F (36.7 C) 98.6 F (37 C)  TempSrc:    Oral  SpO2: 92% 93% 94% 94%  Weight:      Height:        Intake/Output Summary (Last 24 hours) at 05/12/2017 1705 Last data filed at 05/12/2017 1400 Gross per 24 hour  Intake 3010 ml  Output 20 ml  Net 2990 ml   Filed Weights   05/09/17 2124 05/10/17 2021 05/11/17 2118  Weight: 108.8 kg (239 lb 13.8 oz) 108.9 kg (240 lb) 108.9 kg (240 lb 0.2 oz)    Examination:  General exam: Appears calm and comfortable  Respiratory system: Clear to auscultation. Respiratory effort normal. Cardiovascular system: S1 & S2 heard, RRR. No JVD, murmurs, rubs, gallops or clicks. No pedal edema. Gastrointestinal system: Abdomen is nondistended, soft and nontender. No organomegaly or masses felt. Normal bowel sounds heard. Central nervous system: Alert and oriented. No focal neurological deficits. Extremities: Symmetric 5 x 5 power. Genitourinary: Postop dressing changes. Enlarged scrotum. Skin: No rashes, lesions or ulcers Psychiatry: Judgement and insight appear normal. Mood & affect appropriate.     Data Reviewed: I have personally reviewed following labs and imaging studies  CBC: Recent Labs  Lab 05/08/17 1711 05/10/17 0628 05/11/17 0903 05/12/17 0612  WBC 18.3* 18.2* 19.8* 20.1*  NEUTROABS 13.5* 13.8* 14.6* 14.5*  HGB 15.7 12.8* 11.6* 12.0*  HCT 44.1 37.5* 33.3* 34.9*  MCV 85.0 87.2 84.3 85.1  PLT 338 306 309 774   Basic Metabolic Panel: Recent Labs  Lab 05/08/17 1711 05/09/17 0856 05/10/17 0628 05/11/17 0903 05/12/17 0612  NA 133* 132* 135 133* 132*  K 3.8 4.2 3.9 2.8* 3.3*  CL 98* 101 101 99* 100*  CO2 21* 21* 23 22 22   GLUCOSE 241* 292* 271* 181* 165*  BUN 13 9 12 9 8   CREATININE 0.84 0.88 0.73 0.63 0.64  CALCIUM 9.5 8.3* 8.7* 8.1* 7.8*  MG  --   --   --   --  1.7   GFR: Estimated Creatinine Clearance: 158 mL/min (by C-G formula based on SCr of 0.64 mg/dL). Liver Function Tests: Recent Labs  Lab 05/08/17 1711  AST 14*  ALT 16*  ALKPHOS 108  BILITOT 1.0  PROT 8.4*   ALBUMIN 3.5   No results for input(s): LIPASE, AMYLASE in the last 168 hours. No results for input(s): AMMONIA in the last 168 hours. Coagulation Profile: No results for input(s): INR, PROTIME in the last 168 hours. Cardiac Enzymes: No results for input(s): CKTOTAL, CKMB, CKMBINDEX, TROPONINI in the last 168 hours. BNP (last 3 results) No results for input(s): PROBNP in the last 8760 hours. HbA1C: No results for input(s): HGBA1C in the last 72 hours. CBG: Recent Labs  Lab 05/11/17 1635 05/11/17 2117 05/12/17 0935 05/12/17 1143 05/12/17 1637  GLUCAP 207* 186* 174* 202* 310*   Lipid Profile: No results for input(s): CHOL, HDL, LDLCALC, TRIG, CHOLHDL, LDLDIRECT in the last 72 hours. Thyroid Function Tests: No results for input(s): TSH, T4TOTAL, FREET4, T3FREE, THYROIDAB in the last 72 hours. Anemia Panel: No results for input(s): VITAMINB12, FOLATE, FERRITIN, TIBC, IRON, RETICCTPCT in the last 72 hours. Sepsis Labs: Recent Labs  Lab 05/08/17 1723  LATICACIDVEN 1.37    Recent Results (from the past 240 hour(s))  Blood culture (routine x 2)     Status: None (Preliminary result)   Collection Time: 05/08/17  6:33 PM  Result Value Ref Range Status   Specimen Description BLOOD LEFT ANTECUBITAL  Final   Special Requests   Final    BOTTLES DRAWN AEROBIC AND ANAEROBIC Blood Culture adequate volume  Culture   Final    NO GROWTH 4 DAYS Performed at [facility], [place] [address], [place], [place] [address]    Report Status PENDING  Incomplete  Blood culture (routine x 2)     Status: None (Preliminary result)   Collection Time: 05/08/17  6:47 PM  Result Value Ref Range Status   Specimen Description BLOOD LEFT HAND  Final   Special Requests   Final    BOTTLES DRAWN AEROBIC AND ANAEROBIC Blood Culture adequate volume   Culture   Final    NO GROWTH 4 DAYS Performed at [facility], [place] [address], [place], [place] [address]    Report Status PENDING  Incomplete   Urine culture     Status: None   Collection Time: 05/08/17  9:25 PM  Result Value Ref Range Status   Specimen Description URINE, CLEAN CATCH  Final   Special Requests NONE  Final   Culture   Final    NO GROWTH Performed at [facility], [place] [address] [place], [place] [address]    Report Status 05/10/2017 FINAL  Final  Surgical pcr screen     Status: Abnormal   Collection Time: 05/09/17  3:35 AM  Result Value Ref Range Status   MRSA, PCR NEGATIVE NEGATIVE Final   Staphylococcus aureus POSITIVE (A) NEGATIVE Final    Comment: (NOTE) The Xpert SA Assay (FDA approved for NASAL specimens in patients 31 years of age and older), is one component of a comprehensive surveillance program. It is not intended to diagnose infection nor to guide or monitor treatment.   Aerobic/Anaerobic Culture (surgical/deep wound)     Status: None (Preliminary result)   Collection Time: 05/09/17  8:20 AM  Result Value Ref Range Status   Specimen Description ABSCESS RIGHT GROIN  Final   Special Requests NONE  Final   Gram Stain   Final    ABUNDANT WBC PRESENT, PREDOMINANTLY PMN ABUNDANT GRAM POSITIVE RODS ABUNDANT GRAM NEGATIVE RODS MODERATE GRAM POSITIVE COCCI IN PAIRS IN CHAINS    Culture   Final    MODERATE GROUP B STREP(S.AGALACTIAE)ISOLATED TESTING AGAINST S. AGALACTIAE NOT ROUTINELY PERFORMED DUE TO PREDICTABILITY OF AMP/PEN/VAN SUSCEPTIBILITY. HOLDING FOR POSSIBLE ANAEROBE Performed at [facility], [place] [address], [place], [place] [address]    Report Status PENDING  Incomplete         Radiology Studies: No results found.      Scheduled Meds: . Chlorhexidine Gluconate Cloth  6 each Topical Daily  . gabapentin  300 mg Oral TID  . insulin aspart  0-20 Units Subcutaneous TID WC  . insulin detemir  15 Units Subcutaneous BID  . mupirocin ointment  1 application Nasal BID  . pantoprazole  40 mg Oral Daily   Continuous Infusions: . ceFEPime (MAXIPIME) IV Stopped  (05/12/17 1303)  . lactated ringers 50 mL/hr at 05/12/17 1234  . vancomycin Stopped (05/12/17 1511)     LOS: 3 days    Time spent: 35 minutes    Irine Seal, MD [facility] Pager [phone]  If 7PM-7AM, please contact night-coverage [email or web address] Password [email or web address] 05/12/2017, 5:05 PM

## 2017-05-12 NOTE — Consult Note (Signed)
Urology Consult  Consulting MD: Christie Beckers, MD  CC: Inguinal/scrotal infection  HPI: This is a 42year old male who presented to the emergency room 14/13/2019 with right groin pain/infection.  He  Underwent incision and drainage on 05/09/2017.  The patient had progression of his process, and underwent repeat incision and drainage today.  At the time of surgery, he was noted to have significant swelling/redness of the right inguinal and scrotal region down to the inner perineum.  It did not seem to involve the scrotum, however.  There was a significant amount of purulent fluid drained today.  He had irrigation of the wound as well.  At that time, there was no evidence of Fournier's gangrene.  The patient had a dressing placed.  Because of the proximity of the process with his scrotum, urologic consultation is requested for follow-up.  The patient is diabetic.  Apparently, he has poor glucose control.  At the time of his admission, hemoglobin A1c was 13.6.  Blood cultures from admission were negative.  Wound cultures pending, but Gram stain revealed abundant gram-positive and negative rods, gram-positive cocci in pairs and chains.  There was moderate group B strep isolated.  PMH: Past Medical History:  Diagnosis Date  . Hyperlipidemia   . Lesion of penis    foreskin  . OSA (obstructive sleep apnea)    per pt dx osa and used cpap but after losing wt. from 415 pounds down to 244 pounds no longer needs cpap  . Peripheral neuropathy   . Phimosis   . Type 2 diabetes mellitus (HCC)    per pt no meds for two years pt was changed to levemir unable to afford but pt states is waiting on approval for a program he applied for that will pay for his meds ([place])      PSH: Past Surgical History:  Procedure Laterality Date  . CIRCUMCISION N/A 02/10/2016   Procedure: CIRCUMCISION ADULT;  Surgeon: Cleon Gustin, MD;  Location: [facility];  Service: Urology;  Laterality:  N/A;  . INCISION AND DRAINAGE PERIRECTAL ABSCESS Right 05/09/2017   Procedure: IRRIGATION AND DEBRIDEMENT PERINEAL ABSCESS;  Surgeon: Donnie Mesa, MD;  Location: [facility];  Service: General;  Laterality: Right;  . NO PAST SURGERIES      Allergies: Allergies  Allergen Reactions  . Augmentin [Amoxicillin-Pot Clavulanate] Hives and Itching    Has patient had a PCN reaction causing immediate rash, facial/tongue/throat swelling, SOB or lightheadedness with hypotension: Yes Has patient had a PCN reaction causing severe rash involving mucus membranes or skin necrosis: No Has patient had a PCN reaction that required hospitalization: No Has patient had a PCN reaction occurring within the last 10 years: No If all of the above answers are "NO", then may proceed with Cephalosporin use.    Medications: Medications Prior to Admission  Medication Sig Dispense Refill Last Dose  . acetaminophen (TYLENOL) 325 MG tablet Take 325 mg by mouth every 6 (six) hours as needed for headache (pain).   05/08/2017 at mid-day  . bacitracin 500 UNIT/GM ointment Apply 1 application topically 2 (two) times daily. (Patient not taking: Reported on 05/08/2017) 15 g 0 Not Taking at Unknown time  . oxyCODONE-acetaminophen (ROXICET) 5-325 MG tablet Take 1 tablet by mouth every 4 (four) hours as needed for severe pain. (Patient not taking: Reported on 05/08/2017) 30 tablet 0 Not Taking at Unknown time     Social History: Social History   Socioeconomic History  . Marital status:  Married    Spouse name: Not on file  . Number of children: 2  . Years of education: college   . Highest education level: Not on file  Occupational History  . Occupation: Group Home   Social Needs  . Financial resource strain: Not on file  . Food insecurity:    Worry: Not on file    Inability: Not on file  . Transportation needs:    Medical: Not on file    Non-medical: Not on file  Tobacco Use  . Smoking status: Former Smoker    Years: 2.00     Types: Cigarettes    Last attempt to quit: 02/05/2008    Years since quitting: 9.2  . Smokeless tobacco: Never Used  Substance and Sexual Activity  . Alcohol use: No  . Drug use: No  . Sexual activity: Not on file  Lifestyle  . Physical activity:    Days per week: Not on file    Minutes per session: Not on file  . Stress: Not on file  Relationships  . Social connections:    Talks on phone: Not on file    Gets together: Not on file    Attends religious service: Not on file    Active member of club or organization: Not on file    Attends meetings of clubs or organizations: Not on file    Relationship status: Not on file  . Intimate partner violence:    Fear of current or ex partner: Not on file    Emotionally abused: Not on file    Physically abused: Not on file    Forced sexual activity: Not on file  Other Topics Concern  . Not on file  Social History Narrative   Lives with wife and 2 kids, age 98 and 38.     Family History: Family History  Problem Relation Age of Onset  . Diabetes Mother   . Diabetes Father   . Diabetes Sister   . Diabetes Brother   . Diabetes Maternal Grandmother   . Diabetes Maternal Grandfather   . Diabetes Paternal Grandmother   . Diabetes Paternal Grandfather     Review of Systems: Positive: Scrotal pain/swelling, fever.  Pain is better than at the time of admission. Negative: A further 10 point review of systems was negative except what is listed in the HPI.  Physical Exam: @VITALS2 @ General: No acute distress.  Awake. Head:  Normocephalic.  Atraumatic. ENT:  EOMI.  Mucous membranes moist Neck:  Supple.  No lymphadenopathy. CV:  S1 present. S2 present. Regular rate. Pulmonary: Equal effort bilaterally.  Clear to auscultation bilaterally. Abdomen: Soft.  Non-tender to palpation.  Mild erythema/edema in his infrapubic region. Skin:  Normal turgor.  No visible rash. Neurologic: Alert. Appropriate mood.  Penis:  Penis is buried within  the pubic fat. Scrotum: Right greater than left scrotal erythema/edema.  There is no subcutaneous emphysema noted.  There is an open wound at the junction of the inguinal and right scrotal region.  Approximately 5-6 mm of dusky/ecchymotic skin surrounding the wound circumferentially.  In the mid scrotum posteriorly, there is a 2 x 3 cm soft, nontender, ecchymotic area which may be devitalized skin.  Again, there is no subcutaneous emphysema of this area.  Studies:  Recent Labs    05/11/17 0903 05/12/17 0612  HGB 11.6* 12.0*  WBC 19.8* 20.1*  PLT 309 319    Recent Labs    05/11/17 0903 05/12/17 0612  NA 133* 132*  K 2.8* 3.3*  CL 99* 100*  CO2 22 22  BUN 9 8  CREATININE 0.63 0.64  CALCIUM 8.1* 7.8*  GFRNONAA >60 >60  GFRAA >60 >60     No results for input(s): INR, APTT in the last 72 hours.  Invalid input(s): PT   Invalid input(s): ABG    Assessment: Right inguinal/scrotal infectious process.  At this time, it does not seem that the patient has a necrotizing process/Fournier's.  However, there is ecchymotic skin surrounding his recently debrided wound.  Additionally, there seems to be a necrotic area in the posterior scrotal area.  The patient feels much better since his recent incision/drainage.  Plan: 1.  I agree with continuing antibiotics  2.  He will need careful wound checks.  I would suggest that within the next 48 hours he may well need repeat debridement of his open wound, as well as possible debridement of his posterior scrotum.  I will check on his wound in the morning.  If needed, either myself or 1 of the other urologist in our group will be able to assist with any repeat procedure.    [phone]

## 2017-05-13 ENCOUNTER — Encounter (HOSPITAL_COMMUNITY): Payer: Self-pay | Admitting: Surgery

## 2017-05-13 DIAGNOSIS — B951 Streptococcus, group B, as the cause of diseases classified elsewhere: Secondary | ICD-10-CM

## 2017-05-13 DIAGNOSIS — L03314 Cellulitis of groin: Secondary | ICD-10-CM

## 2017-05-13 DIAGNOSIS — L02214 Cutaneous abscess of groin: Secondary | ICD-10-CM

## 2017-05-13 DIAGNOSIS — Z833 Family history of diabetes mellitus: Secondary | ICD-10-CM

## 2017-05-13 DIAGNOSIS — N454 Abscess of epididymis or testis: Secondary | ICD-10-CM

## 2017-05-13 DIAGNOSIS — L0390 Cellulitis, unspecified: Secondary | ICD-10-CM

## 2017-05-13 DIAGNOSIS — E118 Type 2 diabetes mellitus with unspecified complications: Secondary | ICD-10-CM

## 2017-05-13 DIAGNOSIS — Z881 Allergy status to other antibiotic agents status: Secondary | ICD-10-CM

## 2017-05-13 DIAGNOSIS — Z87891 Personal history of nicotine dependence: Secondary | ICD-10-CM

## 2017-05-13 DIAGNOSIS — Z978 Presence of other specified devices: Secondary | ICD-10-CM

## 2017-05-13 LAB — VANCOMYCIN, TROUGH: VANCOMYCIN TR: 16 ug/mL (ref 15–20)

## 2017-05-13 LAB — CULTURE, BLOOD (ROUTINE X 2)
Culture: NO GROWTH
Culture: NO GROWTH
SPECIAL REQUESTS: ADEQUATE
Special Requests: ADEQUATE

## 2017-05-13 LAB — CBC
HCT: 36 % — ABNORMAL LOW (ref 39.0–52.0)
HEMOGLOBIN: 12.1 g/dL — AB (ref 13.0–17.0)
MCH: 28.9 pg (ref 26.0–34.0)
MCHC: 33.6 g/dL (ref 30.0–36.0)
MCV: 86.1 fL (ref 78.0–100.0)
Platelets: 364 10*3/uL (ref 150–400)
RBC: 4.18 MIL/uL — AB (ref 4.22–5.81)
RDW: 12.1 % (ref 11.5–15.5)
WBC: 16.7 10*3/uL — ABNORMAL HIGH (ref 4.0–10.5)

## 2017-05-13 LAB — BASIC METABOLIC PANEL
Anion gap: 9 (ref 5–15)
BUN: 13 mg/dL (ref 6–20)
CHLORIDE: 104 mmol/L (ref 101–111)
CO2: 22 mmol/L (ref 22–32)
CREATININE: 0.7 mg/dL (ref 0.61–1.24)
Calcium: 8.1 mg/dL — ABNORMAL LOW (ref 8.9–10.3)
GFR calc Af Amer: >60 mL/min (ref >60)
GFR calc non Af Amer: >60 mL/min (ref >60)
GLUCOSE: 260 mg/dL — AB (ref 65–99)
POTASSIUM: 3.9 mmol/L (ref 3.5–5.1)
Sodium: 135 mmol/L (ref 135–145)

## 2017-05-13 LAB — GLUCOSE, CAPILLARY
GLUCOSE-CAPILLARY: 229 mg/dL — AB (ref 65–99)
Glucose-Capillary: 223 mg/dL — ABNORMAL HIGH (ref 65–99)
Glucose-Capillary: 225 mg/dL — ABNORMAL HIGH (ref 65–99)
Glucose-Capillary: 186 mg/dL — ABNORMAL HIGH (ref 65–99)

## 2017-05-13 LAB — MAGNESIUM: Magnesium: 2.4 mg/dL (ref 1.7–2.4)

## 2017-05-13 MED ORDER — CEFTRIAXONE SODIUM 2 G IJ SOLR
2.0000 g | INTRAMUSCULAR | Status: DC
  Administered 2017-05-13: 2 g via INTRAVENOUS
  Filled 2017-05-13 (×2): qty 20

## 2017-05-13 MED ORDER — INSULIN DETEMIR 100 UNIT/ML ~~LOC~~ SOLN
20.0000 [IU] | Freq: Two times a day (BID) | SUBCUTANEOUS | Status: DC
  Administered 2017-05-13: 20 [IU] via SUBCUTANEOUS
  Filled 2017-05-13: qty 0.2

## 2017-05-13 MED ORDER — INSULIN ASPART PROT & ASPART (70-30 MIX) 100 UNIT/ML ~~LOC~~ SUSP
25.0000 [IU] | Freq: Two times a day (BID) | SUBCUTANEOUS | Status: DC
  Administered 2017-05-13 – 2017-05-14 (×2): 25 [IU] via SUBCUTANEOUS
  Filled 2017-05-13: qty 10

## 2017-05-13 MED ORDER — METRONIDAZOLE 500 MG PO TABS
500.0000 mg | ORAL_TABLET | Freq: Three times a day (TID) | ORAL | Status: DC
  Administered 2017-05-13 – 2017-05-14 (×3): 500 mg via ORAL
  Filled 2017-05-13 (×3): qty 1

## 2017-05-13 NOTE — Progress Notes (Signed)
In to speak with patient, discussed DME equipment with patient.  Patient denies need of any DME equipment at this time. Denies any questions or concerns at this time. NCM encouraged patient if he has any questions or concerns to let me know.  Montel Culver, BSN, RN Nurse case [place]

## 2017-05-13 NOTE — Progress Notes (Signed)
Pharmacy Antibiotic Note  Shaun Cummings is a 42 y.o. male admitted on 05/08/2017 with cellulitis of groin area.  Pt is s/p I&D 4/14 and 4/17.  Pt continues on Vancomycin and Cefepime.    Vancomycin trough therapeutic on 1250mg  IV q8h.  No changes needed.  Noted plans for ID consult re: abx selection and length of therapy at discharge.  Plan: Continue Vancomycin 1250mg  IV q8h Continue Cefepime 1gm IV q8h  Height: 6' (182.9 cm) Weight: 240 lb 0.2 oz (108.9 kg) IBW/kg (Calculated) : 77.6  Temp (24hrs), Avg:98 F (36.7 C), Min:97.6 F (36.4 C), Max:98.6 F (37 C)  Recent Labs  Lab 05/08/17 1711 05/08/17 1723 05/09/17 0856 05/10/17 0628 05/11/17 0903 05/11/17 1130 05/12/17 0612 05/13/17 0540  WBC 18.3*  --   --  18.2* 19.8*  --  20.1* 16.7*  CREATININE 0.84  --  0.88 0.73 0.63  --  0.64 0.70  LATICACIDVEN  --  1.37  --   --   --   --   --   --   VANCOTROUGH  --   --   --   --   --  7*  --  16    Estimated Creatinine Clearance: 158 mL/min (by C-G formula based on SCr of 0.7 mg/dL).    Allergies  Allergen Reactions  . Augmentin [Amoxicillin-Pot Clavulanate] Hives and Itching    Has patient had a PCN reaction causing immediate rash, facial/tongue/throat swelling, SOB or lightheadedness with hypotension: Yes Has patient had a PCN reaction causing severe rash involving mucus membranes or skin necrosis: No Has patient had a PCN reaction that required hospitalization: No Has patient had a PCN reaction occurring within the last 10 years: No If all of the above answers are "NO", then may proceed with Cephalosporin use.    Antimicrobials this admission: Vancomycin 4/13>> Cefepime 4/15>>   Dose adjustments this admission: 4/16 >> VT 7, Increase to 1250mg  IV q8h 4/18 >> VT 16, no change  Microbiology results: 4/14 abscess cx: mod group b strep  4/13 blood: ngtd 4/14 surg pcr: negative MRSA, positive SA 4/13 urine: negative   Thank you for allowing pharmacy to be a part  of this patient's care.  [employer], Pharm.D., BCPS Clinical Pharmacist Pager: [phone] Clinical phone for 05/13/2017 from 8:30-4:00 is [phone]. After 4pm, please call Main Rx [phone]) for assistance. 05/13/2017 10:32 AM

## 2017-05-13 NOTE — Progress Notes (Signed)
1 Day Post-Op   Subjective/Chief Complaint: Pt much better    Objective: Vital signs in last 24 hours: Temp:  [97.6 F (36.4 C)-98.6 F (37 C)] 97.9 F (36.6 C) (04/18 0450) Pulse Rate:  [53-79] 64 (04/18 0450) Resp:  [14-24] 16 (04/18 0450) BP: (83-120)/(54-83) 120/83 (04/18 0450) SpO2:  [91 %-98 %] 93 % (04/18 0450) Last BM Date: 05/12/17  Intake/Output from previous day: 04/17 0701 - 04/18 0700 In: 3898.3 [P.O.:720; I.V.:1828.3; IV Piggyback:1350] Out: 220 [Urine:200; Blood:20] Intake/Output this shift: No intake/output data recorded.  Incision/Wound:right groin open and clean penrose in place  Scrotum much less swollen   Lab Results:  Recent Labs    05/12/17 0612 05/13/17 0540  WBC 20.1* 16.7*  HGB 12.0* 12.1*  HCT 34.9* 36.0*  PLT 319 364   BMET Recent Labs    05/12/17 0612 05/13/17 0540  NA 132* 135  K 3.3* 3.9  CL 100* 104  CO2 22 22  GLUCOSE 165* 260*  BUN 8 13  CREATININE 0.64 0.70  CALCIUM 7.8* 8.1*   PT/INR No results for input(s): LABPROT, INR in the last 72 hours. ABG No results for input(s): PHART, HCO3 in the last 72 hours.  Invalid input(s): PCO2, PO2  Studies/Results: No results found.  Anti-infectives: Anti-infectives (From admission, onward)   Start     Dose/Rate Route Frequency Ordered Stop   05/11/17 1330  vancomycin (VANCOCIN) 1,250 mg in sodium chloride 0.9 % 250 mL IVPB     1,250 mg 166.7 mL/hr over 90 Minutes Intravenous Every 8 hours 05/11/17 1258     05/11/17 0400  vancomycin (VANCOCIN) IVPB 1000 mg/200 mL premix  Status:  Discontinued     1,000 mg 200 mL/hr over 60 Minutes Intravenous Every 8 hours 05/10/17 1933 05/11/17 1258   05/10/17 2000  ceFEPIme (MAXIPIME) 1 g in sodium chloride 0.9 % 100 mL IVPB     1 g 200 mL/hr over 30 Minutes Intravenous Every 8 hours 05/10/17 1933     05/10/17 1530  ceFEPIme (MAXIPIME) 1 g in sodium chloride 0.9 % 100 mL IVPB  Status:  Discontinued     1 g 200 mL/hr over 30 Minutes  Intravenous Every 8 hours 05/10/17 1420 05/10/17 1933   05/09/17 1400  ceFEPIme (MAXIPIME) 1 g in sodium chloride 0.9 % 100 mL IVPB  Status:  Discontinued     1 g 200 mL/hr over 30 Minutes Intravenous Every 8 hours 05/09/17 1201 05/10/17 1420   05/09/17 0800  clindamycin (CLEOCIN) IVPB 900 mg  Status:  Discontinued     900 mg 100 mL/hr over 30 Minutes Intravenous To ShortStay Surgical 05/09/17 0335 05/09/17 1014   05/09/17 0600  vancomycin (VANCOCIN) IVPB 1000 mg/200 mL premix  Status:  Discontinued     1,000 mg 200 mL/hr over 60 Minutes Intravenous Every 8 hours 05/08/17 1850 05/10/17 1933   05/08/17 1900  ceFEPIme (MAXIPIME) 2 g in sodium chloride 0.9 % 100 mL IVPB     2 g 200 mL/hr over 30 Minutes Intravenous  Once 05/08/17 1847 05/08/17 2022   05/08/17 1845  piperacillin-tazobactam (ZOSYN) IVPB 3.375 g  Status:  Discontinued     3.375 g 100 mL/hr over 30 Minutes Intravenous  Once 05/08/17 1834 05/08/17 1834   05/08/17 1845  vancomycin (VANCOCIN) 2,000 mg in sodium chloride 0.9 % 500 mL IVPB     2,000 mg 250 mL/hr over 120 Minutes Intravenous  Once 05/08/17 1835 05/08/17 2335   05/08/17 1830  piperacillin-tazobactam (ZOSYN)  IVPB 4.5 g  Status:  Discontinued     4.5 g 200 mL/hr over 30 Minutes Intravenous  Once 05/08/17 1829 05/08/17 1833      Assessment/Plan: s/p Procedure(s): INCISION AND DRAINAGE RIGHT INGUINAL ABSCESS (Right) Looks great  Home in a day or 2  Can follow up in 1 - 2 weeks   LOS: 4 days    Marcello Moores A Quenten Nawaz 05/13/2017

## 2017-05-13 NOTE — Progress Notes (Signed)
  RD consulted for nutrition education regarding diabetes. Pt reports prior education on DM diet but it has been many years.   Lab Results  Component Value Date   HGBA1C 13.1 (H) 05/09/2017    RD provided "Carbohydrate Counting for People with Diabetes" handout from the Academy of [employer]etics. Discussed different food groups and their effects on blood sugar, emphasizing carbohydrate-containing foods. Provided list of carbohydrates and recommended serving sizes of common foods. Dietary recall reviewed; pt drinks water and Pepsi, discussed limiting intake of sugar-sweetened beverages. Pt reports he wants to stop drinking them all together. Pt also reports sometimes going 10-12 hours between lunch meal and dinner meal due to work schedule. Discussed eating a small snack between to better assist with glucose management; pt agrees with this plan.   Discussed importance of controlled and consistent carbohydrate intake throughout the day. Provided examples of ways to balance meals/snacks and encouraged intake of high-fiber, whole grain complex carbohydrates. Teach back method used.  Expect good compliance.  Body mass index is 32.55 kg/m. Pt meets criteria for obesity unspecified based on current BMI.  Current diet order is Carb-Modified, patient is consuming approximately 100% of meals at this time. Labs and medications reviewed. No further nutrition interventions warranted at this time. RD contact information provided. If additional nutrition issues arise, please re-consult RD.  Kerman Passey MS, RD, [place], [place] [phone] Pager  [phone] Weekend/On-Call Pager

## 2017-05-13 NOTE — Progress Notes (Signed)
PROGRESS NOTE    Shaun Cummings  TKZ:[id number] DOB: [birth year] DOA: 05/08/2017 PCP: System, Pcp Not In   Brief Narrative:  42 year old male who presented withgroin pain. Patient does have the significant past medical history for phimosis and type 2 diabetes mellitus. Patient complained of 3 days of worsening groin pain, associated with genital swelling and localerythema. Worsening symptoms to the point where he was having difficulty ambulating. On the initial physical examination blood pressure 140/90, heart rate 95, oxygen saturation 100%. His membranes, lungs were clear to auscultation bilaterally, heart sounds are present and rhythmic, abdomen was soft nontender.Positive severe genital swelling, associated with erythema, and a scrotal fluctuant mass, tender to palpation.Sodium 133, potassium 3.8, chloride 98, bicarb 21, glucose 241, BUN 13, creatinine 0.84, white count 18.3, hemoglobin 15.7, hematocrit 44.1, platelets 338,urinalysis greater than 500 glucose,pH 5.0, specific gravity1.039,0-5 white cells, 0-5 RBCs.CT of the abdomen with 3.3 x 2.4 cm high right perineal/scrotal abscess. Associated scrotal wall thickening/edema suggesting cellulitis. No soft tissue gas to suggest necrotizing fasciitis. 2 nonobstructing left renal calculi measuring up to 2 mm. No hydronephrosis. EKG was sinus rhythm, normal axis, normal intervals.  Patient was admitted to the hospitalwith theworking diagnosis of right scrotal abscess complicated by sepsis,and uncontrolled diabetes mellitus.      Assessment & Plan:   Principal Problem:   Sepsis ([place]) Active Problems:   Scrotal abscess   DM (diabetes mellitus), type 2, uncontrolled (HCC)   Obesity (BMI 30-39.9)   Hypokalemia  #1 sepsis secondary to right scrotal abscess Patient status post sharp incision and drainage right groin/perineal/scrotal abscess by Dr.Tsuei 05/09/2017.  Patient however with worsening edema, tenderness and a  worsening leukocytosis that patient subsequently underwent incision and drainage of complex right inguinal scrotal abscess per Dr. Brantley Stage 05/12/2017 with pus noted.  Cultures from 05/09/2017 pending with preliminary culture showing moderate group B strep with final cultures pending.  Blood cultures negative.  Continue empiric IV vancomycin and IV cefepime.  Pain management.  Will consult with ID for antibiotic recommendations and duration.  General surgery following and appreciate input and recommendations.  Urology has also consulted on patient.  2.  Uncontrolled type 2 diabetes mellitus Hemoglobin A1c 13.1 05/09/2017.  CBGs ranging from 223-343.  Increase Levemir to 20 units twice daily.  Patient did not receive a dose of Levemir in the morning of 05/12/2017.  Patient has been seen by diabetic coordinator.  Patient noted not to have insurance and as such we will switch patient to 70/30 at 25 units twice daily which patient should be able to afford post discharge.  Continue sliding scale insulin.  174-310.   3.  Obesity Weight management.  Outpatient follow-up.  4.  Hypokalemia Potassium repleted.  Magnesium repleted and currently at 2.4.  Follow.   DVT prophylaxis: SCDs Code Status: Full Family Communication: Updated patient.  No family at bedside. Disposition Plan: Home when clinically improved and per general surgery with resultant of cultures.   Consultants:   General surgery: Dr. Brantley Stage 05/08/2017  Urology: Dr Diona Fanti 05/12/2017  Infectious diseases pending  Procedures:  Scrotal ultrasound with Doppler 05/08/2017  CT abdomen and pelvis 05/08/2017  Incision and drainage of complex right inguinal scrotal abscess complex per Dr. Brantley Stage 05/12/2017  Sharp incision and drainage of right groin/perineal/scrotal abscess per Dr Georgette Dover  Antimicrobials:   IV cefepime 05/08/2017  IV vancomycin 05/08/2017   Subjective: Patient states feeling much better than he did yesterday after  second surgery.  No chest pain.  No shortness of breath.  Scrotal pain improved.   Objective: Vitals:   05/12/17 1847 05/12/17 2040 05/13/17 0450 05/13/17 0950  BP: 113/70 112/79 120/83 (!) 135/91  Pulse: 71 68 64 70  Resp: 18 16 16 18   Temp: 98.4 F (36.9 C) 97.6 F (36.4 C) 97.9 F (36.6 C) 97.7 F (36.5 C)  TempSrc: Oral Oral Oral Oral  SpO2: 93% 92% 93% 93%  Weight:      Height:        Intake/Output Summary (Last 24 hours) at 05/13/2017 1245 Last data filed at 05/13/2017 0958 Gross per 24 hour  Intake 2520 ml  Output 200 ml  Net 2320 ml   Filed Weights   05/09/17 2124 05/10/17 2021 05/11/17 2118  Weight: 108.8 kg (239 lb 13.8 oz) 108.9 kg (240 lb) 108.9 kg (240 lb 0.2 oz)    Examination:  General exam: Appears calm and comfortable  Respiratory system: Clear to auscultation bilaterally.  No wheezes, no crackles, no rhonchi.  Respiratory effort normal. Cardiovascular system: Regular rate and rhythm no murmurs rubs or gallops.  No JVD.  No lower extremity edema.   Gastrointestinal system: Abdomen is nondistended, soft and nontender. No organomegaly or masses felt. Normal bowel sounds heard. Central nervous system: Alert and oriented. No focal neurological deficits. Extremities: Symmetric 5 x 5 power. Genitourinary: Penrose drains.  Decreased scrotal swelling. Skin: No rashes, lesions or ulcers Psychiatry: Judgement and insight appear normal. Mood & affect appropriate.     Data Reviewed: I have personally reviewed following labs and imaging studies  CBC: Recent Labs  Lab 05/08/17 1711 05/10/17 0628 05/11/17 0903 05/12/17 0612 05/13/17 0540  WBC 18.3* 18.2* 19.8* 20.1* 16.7*  NEUTROABS 13.5* 13.8* 14.6* 14.5*  --   HGB 15.7 12.8* 11.6* 12.0* 12.1*  HCT 44.1 37.5* 33.3* 34.9* 36.0*  MCV 85.0 87.2 84.3 85.1 86.1  PLT 338 306 309 319 962   Basic Metabolic Panel: Recent Labs  Lab 05/09/17 0856 05/10/17 0628 05/11/17 0903 05/12/17 0612 05/13/17 0540  NA  132* 135 133* 132* 135  K 4.2 3.9 2.8* 3.3* 3.9  CL 101 101 99* 100* 104  CO2 21* 23 22 22 22   GLUCOSE 292* 271* 181* 165* 260*  BUN 9 12 9 8 13   CREATININE 0.88 0.73 0.63 0.64 0.70  CALCIUM 8.3* 8.7* 8.1* 7.8* 8.1*  MG  --   --   --  1.7 2.4   GFR: Estimated Creatinine Clearance: 158 mL/min (by C-G formula based on SCr of 0.7 mg/dL). Liver Function Tests: Recent Labs  Lab 05/08/17 1711  AST 14*  ALT 16*  ALKPHOS 108  BILITOT 1.0  PROT 8.4*  ALBUMIN 3.5   No results for input(s): LIPASE, AMYLASE in the last 168 hours. No results for input(s): AMMONIA in the last 168 hours. Coagulation Profile: No results for input(s): INR, PROTIME in the last 168 hours. Cardiac Enzymes: No results for input(s): CKTOTAL, CKMB, CKMBINDEX, TROPONINI in the last 168 hours. BNP (last 3 results) No results for input(s): PROBNP in the last 8760 hours. HbA1C: No results for input(s): HGBA1C in the last 72 hours. CBG: Recent Labs  Lab 05/12/17 1143 05/12/17 1637 05/12/17 2039 05/13/17 0742 05/13/17 1152  GLUCAP 202* 310* 343* 229* 223*   Lipid Profile: No results for input(s): CHOL, HDL, LDLCALC, TRIG, CHOLHDL, LDLDIRECT in the last 72 hours. Thyroid Function Tests: No results for input(s): TSH, T4TOTAL, FREET4, T3FREE, THYROIDAB in the last 72 hours. Anemia Panel: No results for input(s): VITAMINB12, FOLATE, FERRITIN, TIBC, IRON,  RETICCTPCT in the last 72 hours. Sepsis Labs: Recent Labs  Lab 05/08/17 1723  LATICACIDVEN 1.37    Recent Results (from the past 240 hour(s))  Blood culture (routine x 2)     Status: None (Preliminary result)   Collection Time: 05/08/17  6:33 PM  Result Value Ref Range Status   Specimen Description BLOOD LEFT ANTECUBITAL  Final   Special Requests   Final    BOTTLES DRAWN AEROBIC AND ANAEROBIC Blood Culture adequate volume   Culture   Final    NO GROWTH 4 DAYS Performed at [facility], [address] [place], [place] [address]    Report  Status PENDING  Incomplete  Blood culture (routine x 2)     Status: None (Preliminary result)   Collection Time: 05/08/17  6:47 PM  Result Value Ref Range Status   Specimen Description BLOOD LEFT HAND  Final   Special Requests   Final    BOTTLES DRAWN AEROBIC AND ANAEROBIC Blood Culture adequate volume   Culture   Final    NO GROWTH 4 DAYS Performed at [facility], [place] [address], [place], [place] [address]    Report Status PENDING  Incomplete  Urine culture     Status: None   Collection Time: 05/08/17  9:25 PM  Result Value Ref Range Status   Specimen Description URINE, CLEAN CATCH  Final   Special Requests NONE  Final   Culture   Final    NO GROWTH Performed at [facility], [place] [address], [place], [place] [address]    Report Status 05/10/2017 FINAL  Final  Surgical pcr screen     Status: Abnormal   Collection Time: 05/09/17  3:35 AM  Result Value Ref Range Status   MRSA, PCR NEGATIVE NEGATIVE Final   Staphylococcus aureus POSITIVE (A) NEGATIVE Final    Comment: (NOTE) The Xpert SA Assay (FDA approved for NASAL specimens in patients 20 years of age and older), is one component of a comprehensive surveillance program. It is not intended to diagnose infection nor to guide or monitor treatment.   Aerobic/Anaerobic Culture (surgical/deep wound)     Status: None (Preliminary result)   Collection Time: 05/09/17  8:20 AM  Result Value Ref Range Status   Specimen Description ABSCESS RIGHT GROIN  Final   Special Requests NONE  Final   Gram Stain   Final    ABUNDANT WBC PRESENT, PREDOMINANTLY PMN ABUNDANT GRAM POSITIVE RODS ABUNDANT GRAM NEGATIVE RODS MODERATE GRAM POSITIVE COCCI IN PAIRS IN CHAINS    Culture   Final    MODERATE GROUP B STREP(S.AGALACTIAE)ISOLATED TESTING AGAINST S. AGALACTIAE NOT ROUTINELY PERFORMED DUE TO PREDICTABILITY OF AMP/PEN/VAN SUSCEPTIBILITY. HOLDING FOR POSSIBLE ANAEROBE Performed at [facility], [place] [address]  [place], [place] [address]    Report Status PENDING  Incomplete         Radiology Studies: No results found.      Scheduled Meds: . Chlorhexidine Gluconate Cloth  6 each Topical Daily  . gabapentin  300 mg Oral TID  . insulin aspart  0-20 Units Subcutaneous TID WC  . insulin aspart protamine- aspart  25 Units Subcutaneous BID WC  . mupirocin ointment  1 application Nasal BID  . pantoprazole  40 mg Oral Daily   Continuous Infusions: . ceFEPime (MAXIPIME) IV Stopped (05/13/17 0518)  . vancomycin 1,250 mg (05/13/17 0529)     LOS: 4 days    Time spent: 35 minutes  Irine Seal, MD [facility] Pager [phone]  If 7PM-7AM, please contact night-coverage [email or web address] Password [facility] 05/13/2017, 12:45 PM

## 2017-05-13 NOTE — Progress Notes (Signed)
Inpatient Diabetes Program Recommendations  AACE/ADA: New Consensus Statement on Inpatient Glycemic Control (2015)  Target Ranges:  Prepandial:   less than 140 mg/dL      Peak postprandial:   less than 180 mg/dL (1-2 hours)      Critically ill patients:  140 - 180 mg/dL   Lab Results  Component Value Date   GLUCAP 229 (H) 05/13/2017   HGBA1C 13.1 (H) 05/09/2017    Review of Glycemic Control  Results for ELIER, ZELLARS (MRN [id number]) as of 05/13/2017 08:45  Ref. Range 05/12/2017 11:43 05/12/2017 16:37 05/12/2017 20:39 05/13/2017 07:42  Glucose-Capillary Latest Ref Range: 65 - 99 mg/dL 202 (H) 310 (H) 343 (H) 229 (H)   Diabetes history: Type 2 DM Outpatient Diabetes medications: none Current orders for Inpatient glycemic control: Novolog 0-20 units TID, Levemir 20 units BID  Inpatient Diabetes Program Recommendations:    Noted increase to Levemir to 20 units BID from 15 units, additional dose of Decadron X 1 in periop, and missed Levemir dose for am on 4/17.   In preparation for discharge, consider changing patient to 70/30 insulin 25 units BID (basal insulin equivalent 35 units, short acting equivalent 15 units for correction and meals).  Thanks, Bronson Curb, MSN, RNC-OB Diabetes Coordinator [phone] (8a-5p)

## 2017-05-13 NOTE — Progress Notes (Signed)
1 Day Post-Op Subjective: Patient reports less pain. No problems voiding.   Objective: Vital signs in last 24 hours: Temp:  [97.6 F (36.4 C)-98.6 F (37 C)] 97.9 F (36.6 C) (04/18 0450) Pulse Rate:  [53-79] 64 (04/18 0450) Resp:  [14-24] 16 (04/18 0450) BP: (83-120)/(54-83) 120/83 (04/18 0450) SpO2:  [91 %-98 %] 93 % (04/18 0450)  Intake/Output from previous day: 04/17 0701 - 04/18 0700 In: 3898.3 [P.O.:720; I.V.:1828.3; IV Piggyback:1350] Out: 220 [Urine:200; Blood:20] Intake/Output this shift: No intake/output data recorded.  Physical Exam:   Genitourinary: Significantly less scrotal edema, somewhat less tenderness. No crepitus in scrotum/perineum/infrapubic area. Stable eccyhmosis in midline scrotum, surrounding wound. No significant drainage.  Lab Results: Recent Labs    05/11/17 0903 05/12/17 0612 05/13/17 0540  HGB 11.6* 12.0* 12.1*  HCT 33.3* 34.9* 36.0*   BMET Recent Labs    05/12/17 0612 05/13/17 0540  NA 132* 135  K 3.3* 3.9  CL 100* 104  CO2 22 22  GLUCOSE 165* 260*  BUN 8 13  CREATININE 0.64 0.70  CALCIUM 7.8* 8.1*   No results for input(s): LABPT, INR in the last 72 hours. No results for input(s): LABURIN in the last 72 hours. Results for orders placed or performed during the hospital encounter of 05/08/17  Blood culture (routine x 2)     Status: None (Preliminary result)   Collection Time: 05/08/17  6:33 PM  Result Value Ref Range Status   Specimen Description BLOOD LEFT ANTECUBITAL  Final   Special Requests   Final    BOTTLES DRAWN AEROBIC AND ANAEROBIC Blood Culture adequate volume   Culture   Final    NO GROWTH 4 DAYS Performed at [facility], [place] [address], [place], [place] [address]    Report Status PENDING  Incomplete  Blood culture (routine x 2)     Status: None (Preliminary result)   Collection Time: 05/08/17  6:47 PM  Result Value Ref Range Status   Specimen Description BLOOD LEFT HAND  Final   Special Requests    Final    BOTTLES DRAWN AEROBIC AND ANAEROBIC Blood Culture adequate volume   Culture   Final    NO GROWTH 4 DAYS Performed at [facility], [place] [address] [place], [place] [address]    Report Status PENDING  Incomplete  Urine culture     Status: None   Collection Time: 05/08/17  9:25 PM  Result Value Ref Range Status   Specimen Description URINE, CLEAN CATCH  Final   Special Requests NONE  Final   Culture   Final    NO GROWTH Performed at [facility], [place] [address], [place], [place] [address]    Report Status 05/10/2017 FINAL  Final  Surgical pcr screen     Status: Abnormal   Collection Time: 05/09/17  3:35 AM  Result Value Ref Range Status   MRSA, PCR NEGATIVE NEGATIVE Final   Staphylococcus aureus POSITIVE (A) NEGATIVE Final    Comment: (NOTE) The Xpert SA Assay (FDA approved for NASAL specimens in patients 66 years of age and older), is one component of a comprehensive surveillance program. It is not intended to diagnose infection nor to guide or monitor treatment.   Aerobic/Anaerobic Culture (surgical/deep wound)     Status: None (Preliminary result)   Collection Time: 05/09/17  8:20 AM  Result Value Ref Range Status   Specimen Description ABSCESS RIGHT GROIN  Final   Special Requests NONE  Final  Gram Stain   Final    ABUNDANT WBC PRESENT, PREDOMINANTLY PMN ABUNDANT GRAM POSITIVE RODS ABUNDANT GRAM NEGATIVE RODS MODERATE GRAM POSITIVE COCCI IN PAIRS IN CHAINS    Culture   Final    MODERATE GROUP B STREP(S.AGALACTIAE)ISOLATED TESTING AGAINST S. AGALACTIAE NOT ROUTINELY PERFORMED DUE TO PREDICTABILITY OF AMP/PEN/VAN SUSCEPTIBILITY. HOLDING FOR POSSIBLE ANAEROBE Performed at [facility], [place] [address] [place], [place] [address]    Report Status PENDING  Incomplete    Studies/Results: No results found.  Assessment/Plan:   Inguinal/scrotal infectious process. Seems stable @ present. Some areas of ecchymosis may require eventual  debridement but process does not seem to be progressing.     I would consider adding specific anaerobic coverage.  We will continue to follow w/ you.       LOS: 4 days   Jorja Loa 05/13/2017, 8:06 AM

## 2017-05-13 NOTE — Consult Note (Signed)
[facility] for Infectious Disease    Date of Admission:  05/08/2017     Total days of antibiotics 6  Day 6 cefepime  Day 6 vancomycin                Reason for Consult: Right testicular/groin abscess.     Referring Provider: [facility] Primary Care Provider: System, Pcp Not In   Assessment: 42 y.o. male with cellulitis/abscess of the right groin now s/p second debridement. Will change antibiotics to IV ceftriaxone and flagyl PO while he is inpatient to cover for anaerobes and gram negative organisms as well as recovered group B strep.    Plan: 1. Start ceftriaxone 2 gm IV Q24h  2. Start metronidazole 500 mg TID 3. Check HIV test for health maintenance.   Principal Problem:   Sepsis ([place]) Active Problems:   DM (diabetes mellitus), type 2, uncontrolled ([place])   Obesity (BMI 30-39.9)   Scrotal abscess   Hypokalemia   . Chlorhexidine Gluconate Cloth  6 each Topical Daily  . gabapentin  300 mg Oral TID  . insulin aspart  0-20 Units Subcutaneous TID WC  . insulin aspart protamine- aspart  25 Units Subcutaneous BID WC  . mupirocin ointment  1 application Nasal BID  . pantoprazole  40 mg Oral Daily    HPI: Shaun Cummings is a 42 y.o. male admitted on 05/08/2017 with swelling and pain of the right inguinal canal. Incision and drainage on 4/14 - purulent fluid was encountered and tracked down on spermatic cord to the right hemiscrotum. Penrose drain was initially placed. On April 17th he underwent repeat I&D of this space with Dr. Brantley Stage for worsened swelling and fevers; it was felt that there was some un drained fluid in this space. He has since this second surgery felt much better. No further fevers and swelling is much improved. He received cefepime and vancomycin prior to surgery.    Review of Systems  Constitutional: Negative for chills and fever.  HENT: Negative for tinnitus.   Eyes: Negative for blurred vision and photophobia.  Respiratory: Negative for cough  and sputum production.   Cardiovascular: Negative for chest pain.  Gastrointestinal: Negative for abdominal pain, diarrhea, nausea and vomiting.  Genitourinary: Negative for dysuria.       Scrotal swelling. Improved with second surgery.   Skin: Negative for rash.  Neurological: Negative for headaches.    Past Medical History:  Diagnosis Date  . Hyperlipidemia   . Lesion of penis    foreskin  . OSA (obstructive sleep apnea)    per pt dx osa and used cpap but after losing wt. from 415 pounds down to 244 pounds no longer needs cpap  . Peripheral neuropathy   . Phimosis   . Type 2 diabetes mellitus (H[facility])    per pt no meds for two years pt was changed to levemir unable to afford but pt states is waiting on approval for a program he applied for that will pay for his meds [facility])      Social History   Tobacco Use  . Smoking status: Former Smoker    Years: 2.00    Types: Cigarettes    Last attempt to quit: 02/05/2008    Years since quitting: 9.2  . Smokeless tobacco: Never Used  Substance Use Topics  . Alcohol use: No  . Drug use: No    Family History  Problem Relation Age of Onset  . Diabetes  Mother   . Diabetes Father   . Diabetes Sister   . Diabetes Brother   . Diabetes Maternal Grandmother   . Diabetes Maternal Grandfather   . Diabetes Paternal Grandmother   . Diabetes Paternal Grandfather    Allergies  Allergen Reactions  . Augmentin [Amoxicillin-Pot Clavulanate] Hives and Itching    Has patient had a PCN reaction causing immediate rash, facial/tongue/throat swelling, SOB or lightheadedness with hypotension: Yes Has patient had a PCN reaction causing severe rash involving mucus membranes or skin necrosis: No Has patient had a PCN reaction that required hospitalization: No Has patient had a PCN reaction occurring within the last 10 years: No If all of the above answers are "NO", then may proceed with Cephalosporin use.    OBJECTIVE: Blood pressure  (!) 135/91, pulse 70, temperature 97.7 F (36.5 C), temperature source Oral, resp. rate 18, height 6' (1.829 m), weight 240 lb 0.2 oz (108.9 kg), SpO2 93 %.  Physical Exam  Constitutional: He is oriented to person, place, and time.  Resting comfortably in bed.   HENT:  Mouth/Throat: No oral lesions. Normal dentition. No dental caries. No oropharyngeal exudate.  Eyes: Pupils are equal, round, and reactive to light. No scleral icterus.  Cardiovascular: Normal rate, regular rhythm and normal heart sounds.  Pulmonary/Chest: Effort normal and breath sounds normal.  Abdominal: Soft. He exhibits no distension. There is no tenderness.  Genitourinary: Penis normal.  Genitourinary Comments: 2 penrose drains (anterior and posterior). Small tan drainage with slight odor.   Lymphadenopathy:    He has no cervical adenopathy.  Neurological: He is alert and oriented to person, place, and time.  Skin: Skin is warm and dry. No rash noted.  Vitals reviewed.   Lab Results Lab Results  Component Value Date   WBC 16.7 (H) 05/13/2017   HGB 12.1 (L) 05/13/2017   HCT 36.0 (L) 05/13/2017   MCV 86.1 05/13/2017   PLT 364 05/13/2017    Lab Results  Component Value Date   CREATININE 0.70 05/13/2017   BUN 13 05/13/2017   NA 135 05/13/2017   K 3.9 05/13/2017   CL 104 05/13/2017   CO2 22 05/13/2017    Lab Results  Component Value Date   ALT 16 (L) 05/08/2017   AST 14 (L) 05/08/2017   ALKPHOS 108 05/08/2017   BILITOT 1.0 05/08/2017     Microbiology: Recent Results (from the past 240 hour(s))  Blood culture (routine x 2)     Status: None   Collection Time: 05/08/17  6:33 PM  Result Value Ref Range Status   Specimen Description BLOOD LEFT ANTECUBITAL  Final   Special Requests   Final    BOTTLES DRAWN AEROBIC AND ANAEROBIC Blood Culture adequate volume   Culture   Final    NO GROWTH 5 DAYS Performed at [facility], [address], [place], [place] [address]    Report Status 05/13/2017  FINAL  Final  Blood culture (routine x 2)     Status: None   Collection Time: 05/08/17  6:47 PM  Result Value Ref Range Status   Specimen Description BLOOD LEFT HAND  Final   Special Requests   Final    BOTTLES DRAWN AEROBIC AND ANAEROBIC Blood Culture adequate volume   Culture   Final    NO GROWTH 5 DAYS Performed at [facility], [place] [address] [place], [place] [address]    Report Status 05/13/2017 FINAL  Final  Urine culture  Status: None   Collection Time: 05/08/17  9:25 PM  Result Value Ref Range Status   Specimen Description URINE, CLEAN CATCH  Final   Special Requests NONE  Final   Culture   Final    NO GROWTH Performed at [facility], [place] [address], [place], [place] [address]    Report Status 05/10/2017 FINAL  Final  Surgical pcr screen     Status: Abnormal   Collection Time: 05/09/17  3:35 AM  Result Value Ref Range Status   MRSA, PCR NEGATIVE NEGATIVE Final   Staphylococcus aureus POSITIVE (A) NEGATIVE Final    Comment: (NOTE) The Xpert SA Assay (FDA approved for NASAL specimens in patients 41 years of age and older), is one component of a comprehensive surveillance program. It is not intended to diagnose infection nor to guide or monitor treatment.   Aerobic/Anaerobic Culture (surgical/deep wound)     Status: None (Preliminary result)   Collection Time: 05/09/17  8:20 AM  Result Value Ref Range Status   Specimen Description ABSCESS RIGHT GROIN  Final   Special Requests NONE  Final   Gram Stain   Final    ABUNDANT WBC PRESENT, PREDOMINANTLY PMN ABUNDANT GRAM POSITIVE RODS ABUNDANT GRAM NEGATIVE RODS MODERATE GRAM POSITIVE COCCI IN PAIRS IN CHAINS    Culture   Final    MODERATE GROUP B STREP(S.AGALACTIAE)ISOLATED TESTING AGAINST S. AGALACTIAE NOT ROUTINELY PERFORMED DUE TO PREDICTABILITY OF AMP/PEN/VAN SUSCEPTIBILITY. HOLDING FOR POSSIBLE ANAEROBE Performed at [facility], [place] [address], [place], [place] [address]    Report Status  PENDING  Incomplete    Janene Madeira, MSN, NP-C [facility] [place] Cell: [phone] Pager: [phone]  05/13/2017 3:23 PM

## 2017-05-14 DIAGNOSIS — E876 Hypokalemia: Secondary | ICD-10-CM

## 2017-05-14 DIAGNOSIS — E1165 Type 2 diabetes mellitus with hyperglycemia: Secondary | ICD-10-CM

## 2017-05-14 DIAGNOSIS — E669 Obesity, unspecified: Secondary | ICD-10-CM

## 2017-05-14 DIAGNOSIS — A419 Sepsis, unspecified organism: Secondary | ICD-10-CM

## 2017-05-14 DIAGNOSIS — L03818 Cellulitis of other sites: Secondary | ICD-10-CM

## 2017-05-14 DIAGNOSIS — R739 Hyperglycemia, unspecified: Secondary | ICD-10-CM

## 2017-05-14 DIAGNOSIS — N492 Inflammatory disorders of scrotum: Secondary | ICD-10-CM

## 2017-05-14 LAB — HEPATITIS PANEL, ACUTE
HCV Ab: <0.1 s/co ratio (ref 0.0–0.9)
HEP B S AG: NEGATIVE
Hep A IgM: NEGATIVE
Hep B C IgM: NEGATIVE

## 2017-05-14 LAB — BASIC METABOLIC PANEL
Anion gap: 10 (ref 5–15)
BUN: 15 mg/dL (ref 6–20)
CHLORIDE: 103 mmol/L (ref 101–111)
CO2: 24 mmol/L (ref 22–32)
CREATININE: 0.84 mg/dL (ref 0.61–1.24)
Calcium: 8.1 mg/dL — ABNORMAL LOW (ref 8.9–10.3)
GFR calc Af Amer: >60 mL/min (ref >60)
GFR calc non Af Amer: >60 mL/min (ref >60)
Glucose, Bld: 156 mg/dL — ABNORMAL HIGH (ref 65–99)
Potassium: 3.6 mmol/L (ref 3.5–5.1)
Sodium: 137 mmol/L (ref 135–145)

## 2017-05-14 LAB — CBC WITH DIFFERENTIAL/PLATELET
BLASTS: 0 %
Band Neutrophils: 8 %
Basophils Absolute: 0 10*3/uL (ref 0.0–0.1)
Basophils Relative: 0 %
Eosinophils Absolute: 0 10*3/uL (ref 0.0–0.7)
Eosinophils Relative: 0 %
HEMATOCRIT: 35.2 % — AB (ref 39.0–52.0)
HEMOGLOBIN: 11.9 g/dL — AB (ref 13.0–17.0)
LYMPHS PCT: 37 %
Lymphs Abs: 4.5 10*3/uL — ABNORMAL HIGH (ref 0.7–4.0)
MCH: 29.2 pg (ref 26.0–34.0)
MCHC: 33.8 g/dL (ref 30.0–36.0)
MCV: 86.5 fL (ref 78.0–100.0)
MONOS PCT: 9 %
Metamyelocytes Relative: 3 %
Monocytes Absolute: 1.1 10*3/uL — ABNORMAL HIGH (ref 0.1–1.0)
Myelocytes: 2 %
Neutro Abs: 6.6 10*3/uL (ref 1.7–7.7)
Neutrophils Relative %: 41 %
OTHER: 0 %
Platelets: 382 10*3/uL (ref 150–400)
Promyelocytes Relative: 0 %
RBC: 4.07 MIL/uL — AB (ref 4.22–5.81)
RDW: 12.2 % (ref 11.5–15.5)
WBC: 12.2 10*3/uL — ABNORMAL HIGH (ref 4.0–10.5)
nRBC: 0 /100 WBC

## 2017-05-14 LAB — AEROBIC/ANAEROBIC CULTURE (SURGICAL/DEEP WOUND)

## 2017-05-14 LAB — GLUCOSE, CAPILLARY
Glucose-Capillary: 136 mg/dL — ABNORMAL HIGH (ref 65–99)
Glucose-Capillary: 130 mg/dL — ABNORMAL HIGH (ref 65–99)

## 2017-05-14 LAB — AEROBIC/ANAEROBIC CULTURE W GRAM STAIN (SURGICAL/DEEP WOUND)

## 2017-05-14 LAB — HIV ANTIBODY (ROUTINE TESTING W REFLEX): HIV SCREEN 4TH GENERATION: NONREACTIVE

## 2017-05-14 MED ORDER — CEPHALEXIN 500 MG PO CAPS: 500.0000 mg | ORAL_CAPSULE | Freq: Four times a day (QID) | ORAL | 0 refills | Status: DC

## 2017-05-14 MED ORDER — METRONIDAZOLE 500 MG PO TABS: 500.0000 mg | ORAL_TABLET | Freq: Three times a day (TID) | ORAL | 0 refills | Status: DC

## 2017-05-14 MED ORDER — GABAPENTIN 300 MG PO CAPS: 300.0000 mg | ORAL_CAPSULE | Freq: Three times a day (TID) | ORAL | 0 refills | Status: DC

## 2017-05-14 MED ORDER — INSULIN ASPART PROT & ASPART (70-30 MIX) 100 UNIT/ML ~~LOC~~ SUSP: 25.0000 [IU] | Freq: Two times a day (BID) | SUBCUTANEOUS | 0 refills | Status: DC

## 2017-05-14 MED ORDER — PANTOPRAZOLE SODIUM 40 MG PO TBEC: 40.0000 mg | DELAYED_RELEASE_TABLET | Freq: Every day | ORAL | 0 refills | Status: DC

## 2017-05-14 MED ORDER — BLOOD GLUCOSE METER KIT: PACK | 0 refills | Status: DC

## 2017-05-14 MED ORDER — "INSULIN SYRINGE 31G X 5/16"" 0.3 ML MISC": 25.0000 [IU] | Freq: Two times a day (BID) | 0 refills | Status: DC

## 2017-05-14 NOTE — Progress Notes (Signed)
2 Days Post-Op   Subjective/Chief Complaint: Wants to go home   Objective: Vital signs in last 24 hours: Temp:  [97.5 F (36.4 C)-98 F (36.7 C)] 97.8 F (36.6 C) (04/19 0752) Pulse Rate:  [63-70] 66 (04/19 0752) Resp:  [16-18] 18 (04/19 0752) BP: (133-147)/(84-92) 147/92 (04/19 0752) SpO2:  [93 %-97 %] 95 % (04/19 0752) Last BM Date: 05/13/17  Intake/Output from previous day: 04/18 0701 - 04/19 0700 In: 700 [P.O.:600; IV Piggyback:100] Out: 0  Intake/Output this shift: Total I/O In: 240 [P.O.:240] Out: -   cellulitis improved, penroses in place  Lab Results:  Recent Labs    05/13/17 0540 05/14/17 0315  WBC 16.7* 12.2*  HGB 12.1* 11.9*  HCT 36.0* 35.2*  PLT 364 382   BMET Recent Labs    05/13/17 0540 05/14/17 0315  NA 135 137  K 3.9 3.6  CL 104 103  CO2 22 24  GLUCOSE 260* 156*  BUN 13 15  CREATININE 0.70 0.84  CALCIUM 8.1* 8.1*   PT/INR No results for input(s): LABPROT, INR in the last 72 hours. ABG No results for input(s): PHART, HCO3 in the last 72 hours.  Invalid input(s): PCO2, PO2  Studies/Results: No results found.  Anti-infectives: Anti-infectives (From admission, onward)   Start     Dose/Rate Route Frequency Ordered Stop   05/13/17 2000  cefTRIAXone (ROCEPHIN) 2 g in sodium chloride 0.9 % 100 mL IVPB     2 g 200 mL/hr over 30 Minutes Intravenous Every 24 hours 05/13/17 1606     05/13/17 1700  metroNIDAZOLE (FLAGYL) tablet 500 mg     500 mg Oral Every 8 hours 05/13/17 1604     05/11/17 1330  vancomycin (VANCOCIN) 1,250 mg in sodium chloride 0.9 % 250 mL IVPB  Status:  Discontinued     1,250 mg 166.7 mL/hr over 90 Minutes Intravenous Every 8 hours 05/11/17 1258 05/13/17 1604   05/11/17 0400  vancomycin (VANCOCIN) IVPB 1000 mg/200 mL premix  Status:  Discontinued     1,000 mg 200 mL/hr over 60 Minutes Intravenous Every 8 hours 05/10/17 1933 05/11/17 1258   05/10/17 2000  ceFEPIme (MAXIPIME) 1 g in sodium chloride 0.9 % 100 mL IVPB   Status:  Discontinued     1 g 200 mL/hr over 30 Minutes Intravenous Every 8 hours 05/10/17 1933 05/13/17 1604   05/10/17 1530  ceFEPIme (MAXIPIME) 1 g in sodium chloride 0.9 % 100 mL IVPB  Status:  Discontinued     1 g 200 mL/hr over 30 Minutes Intravenous Every 8 hours 05/10/17 1420 05/10/17 1933   05/09/17 1400  ceFEPIme (MAXIPIME) 1 g in sodium chloride 0.9 % 100 mL IVPB  Status:  Discontinued     1 g 200 mL/hr over 30 Minutes Intravenous Every 8 hours 05/09/17 1201 05/10/17 1420   05/09/17 0800  clindamycin (CLEOCIN) IVPB 900 mg  Status:  Discontinued     900 mg 100 mL/hr over 30 Minutes Intravenous To [facility]Stay [facility] 05/09/17 0335 05/09/17 1014   05/09/17 0600  vancomycin (VANCOCIN) IVPB 1000 mg/200 mL premix  Status:  Discontinued     1,000 mg 200 mL/hr over 60 Minutes Intravenous Every 8 hours 05/08/17 1850 05/10/17 1933   05/08/17 1900  ceFEPIme (MAXIPIME) 2 g in sodium chloride 0.9 % 100 mL IVPB     2 g 200 mL/hr over 30 Minutes Intravenous  Once 05/08/17 1847 05/08/17 2022   05/08/17 1845  piperacillin-tazobactam (ZOSYN) IVPB 3.375 g  Status:  Discontinued     3.375 g 100 mL/hr over 30 Minutes Intravenous  Once 05/08/17 1834 05/08/17 1834   05/08/17 1845  vancomycin (VANCOCIN) 2,000 mg in sodium chloride 0.9 % 500 mL IVPB     2,000 mg 250 mL/hr over 120 Minutes Intravenous  Once 05/08/17 1835 05/08/17 2335   05/08/17 1830  piperacillin-tazobactam (ZOSYN) IVPB 4.5 g  Status:  Discontinued     4.5 g 200 mL/hr over 30 Minutes Intravenous  Once 05/08/17 1829 05/08/17 1833      Assessment/Plan: S/P I&D inguinal and scrotal abscesses X 2 - improved. Appreciate ID eval. OK to D/C on PO ABX from our standpoint. F/U scheduled.  LOS: 5 days    Zenovia Jarred 05/14/2017

## 2017-05-14 NOTE — Progress Notes (Signed)
Shaun Cummings to be D/C'd Home per MD order.  Discussed prescriptions and follow up appointments with the patient. Prescriptions given to patient, medication list explained in detail. Pt verbalized understanding.  Allergies as of 05/14/2017      Reactions   Augmentin [amoxicillin-pot Clavulanate] Hives, Itching   Has patient had a PCN reaction causing immediate rash, facial/tongue/throat swelling, SOB or lightheadedness with hypotension: Yes Has patient had a PCN reaction causing severe rash involving mucus membranes or skin necrosis: No Has patient had a PCN reaction that required hospitalization: No Has patient had a PCN reaction occurring within the last 10 years: No If all of the above answers are "NO", then may proceed with Cephalosporin use.      Medication List    STOP taking these medications   bacitracin 500 UNIT/GM ointment   oxyCODONE-acetaminophen 5-325 MG tablet Commonly known as:  ROXICET     TAKE these medications   acetaminophen 325 MG tablet Commonly known as:  TYLENOL Take 325 mg by mouth every 6 (six) hours as needed for headache (pain).   blood glucose meter kit and supplies Dispense based on patient and insurance preference. Use up to four times daily as directed. (FOR ICD-10 E10.9, E11.9).   cephALEXin 500 MG capsule Commonly known as:  KEFLEX Take 1 capsule (500 mg total) by mouth 4 (four) times daily for 7 days.   gabapentin 300 MG capsule Commonly known as:  NEURONTIN Take 1 capsule (300 mg total) by mouth 3 (three) times daily.   insulin aspart protamine- aspart (70-30) 100 UNIT/ML injection Commonly known as:  NOVOLOG MIX 70/30 Inject 0.25 mLs (25 Units total) into the skin 2 (two) times daily with a meal.   INSULIN SYRINGE .3CC/31GX5/16" 31G X 5/16" 0.3 ML Misc 25 Units by Does not apply route 2 (two) times daily.   metroNIDAZOLE 500 MG tablet Commonly known as:  FLAGYL Take 1 tablet (500 mg total) by mouth every 8 (eight) hours for 7 days.    oxyCODONE 5 MG immediate release tablet Commonly known as:  Oxy IR/ROXICODONE Take 1 tablet (5 mg total) by mouth every 4 (four) hours as needed for moderate pain.   pantoprazole 40 MG tablet Commonly known as:  PROTONIX Take 1 tablet (40 mg total) by mouth daily. Start taking on:  05/15/2017       Vitals:   05/14/17 0453 05/14/17 0752  BP: 133/84 (!) 147/92  Pulse: 63 66  Resp: 16 18  Temp: (!) 97.5 F (36.4 C) 97.8 F (36.6 C)  SpO2: 95% 95%    Skin clean, dry and intact without evidence of skin break down, no evidence of skin tears noted. IV catheter discontinued intact. Site without signs and symptoms of complications. Dressing and pressure applied. Pt denies pain at this time. No complaints noted.  An After Visit Summary was printed and given to the patient. Patient escorted via [place], and D/C home via private auto.  Dixie Dials RN, BSN

## 2017-05-14 NOTE — Discharge Summary (Signed)
Physician Discharge Summary  Shaun Cummings ZDG:[id number] DOB: April 04, 1975 DOA: 05/08/2017  PCP: System, Pcp Not In  Admit date: 05/08/2017 Discharge date: 05/14/2017  Time spent: 60 minutes  Recommendations for Outpatient Follow-up:  1. Follow-up with Vaughan Browner, PA general surgery 05/25/2017. 2. Follow-up with her [facility] 06/09/2017 to establish primary care.  On follow-up patient's diabetes will need to be reassessed and patient's medications adjusted.  Patient will need further diabetes education. 3. Follow-up with Dr. Alyson Ingles, urology in 2 weeks.   Discharge Diagnoses:  Principal Problem:   Sepsis ([place]) Active Problems:   Scrotal abscess   DM (diabetes mellitus), type 2, uncontrolled (HCC)   Obesity (BMI 30-39.9)   Hypokalemia   Cellulitis   Discharge Condition: Stable and improved  Diet recommendation: Carb modified diet  Filed Weights   05/09/17 2124 05/10/17 2021 05/11/17 2118  Weight: 108.8 kg (239 lb 13.8 oz) 108.9 kg (240 lb) 108.9 kg (240 lb 0.2 oz)    History of present illness:  Per Dr Izora Ribas is a 42 y.o. male with past medical history significant for phimosis status post circumcision, unmanaged diabetes presented to the emergency room with a chief complaint of groin pain.  Patient has had pain in the groin for the last 3 days.  Has some initial swelling below the scrotum.  Then later developed swelling above the penis.  Swelling continued to worsen redness became apparent.  Pain got to the point patient had difficulty ambulating.  At that point patient decided come to the emergency room for evaluation   ED course: EDP contacted urology who advised general surgery consult for drainage of abscess.  General surgery came to the bedside to evaluate the patient.  Plan for OR.  Patient given vancomycin.  Hospitalist consulted for admission.  Review of Systems:     Hospital Course:  #1 sepsis secondary to right scrotal  abscess Patient presented with right scrotal abscess noted to be septic.  Patient was admitted and general surgery consulted.  Patient status post sharp incision and drainage right groin/perineal/scrotal abscess by Dr.Tsuei 05/09/2017.  Patient however with worsening edema, tenderness and a worsening leukocytosis that patient subsequently underwent incision and drainage of complex right inguinal scrotal abscess per Dr. Brantley Stage 05/12/2017 with pus noted.  Cultures from 05/09/2017 pending with preliminary culture showing moderate group B strep with final cultures pending.  Blood cultures negative.    Patient during hospitalization was placed empirically on IV vancomycin and IV cefepime.  Patient underwent pain management.  Patient was also seen by urology during the hospitalization.  ID was consulted who had recommended to transition patient to oral Keflex and Flagyl on discharge.  Patient was discharged on oral Keflex and Flagyl for 1 more week and will follow up with general surgery in the outpatient setting.  Patient will also follow-up with urology in the outpatient setting.    2.  Uncontrolled type 2 diabetes mellitus Hemoglobin A1c 13.1 05/09/2017.  CBGs were elevated during the hospitalization.  Patient was initially started on Levemir and dose increased.  Patient was noted not to have insurance and as such was subsequently transitioned to 70/30 insulin 25 units twice daily with better blood glucose control.  Patient blood glucose levels improved on this regimen which patient will be discharged home on.  Outpatient follow-up with PCP.    3.  Obesity Weight management.  Outpatient follow-up.  4.  Hypokalemia Potassium repleted.  Magnesium repleted and currently at 2.4.  Follow.  Procedures:  Scrotal ultrasound with Doppler 05/08/2017  CT abdomen and pelvis 05/08/2017  Incision and drainage of complex right inguinal scrotal abscess complex per Dr. Brantley Stage 05/12/2017  Sharp incision and  drainage of right groin/perineal/scrotal abscess per Dr Georgette Dover      Consultations:  General surgery: Dr. Brantley Stage 05/08/2017  Urology: Dr Diona Fanti 05/12/2017  Infectious diseases: Dr. Johnnye Sima 05/13/2017      Discharge Exam: Vitals:   05/14/17 0453 05/14/17 0752  BP: 133/84 (!) 147/92  Pulse: 63 66  Resp: 16 18  Temp: (!) 97.5 F (36.4 C) 97.8 F (36.6 C)  SpO2: 95% 95%    General: NAD Cardiovascular: RRR Respiratory: CTAB  Discharge Instructions   Discharge Instructions    Diet Carb Modified   Complete by:  As directed    Increase activity slowly   Complete by:  As directed      Allergies as of 05/14/2017      Reactions   Augmentin [amoxicillin-pot Clavulanate] Hives, Itching   Has patient had a PCN reaction causing immediate rash, facial/tongue/throat swelling, SOB or lightheadedness with hypotension: Yes Has patient had a PCN reaction causing severe rash involving mucus membranes or skin necrosis: No Has patient had a PCN reaction that required hospitalization: No Has patient had a PCN reaction occurring within the last 10 years: No If all of the above answers are "NO", then may proceed with Cephalosporin use.      Medication List    STOP taking these medications   bacitracin 500 UNIT/GM ointment   oxyCODONE-acetaminophen 5-325 MG tablet Commonly known as:  ROXICET     TAKE these medications   acetaminophen 325 MG tablet Commonly known as:  TYLENOL Take 325 mg by mouth every 6 (six) hours as needed for headache (pain).   blood glucose meter kit and supplies Dispense based on patient and insurance preference. Use up to four times daily as directed. (FOR ICD-10 E10.9, E11.9).   cephALEXin 500 MG capsule Commonly known as:  KEFLEX Take 1 capsule (500 mg total) by mouth 4 (four) times daily for 7 days.   gabapentin 300 MG capsule Commonly known as:  NEURONTIN Take 1 capsule (300 mg total) by mouth 3 (three) times daily.   insulin aspart  protamine- aspart (70-30) 100 UNIT/ML injection Commonly known as:  NOVOLOG MIX 70/30 Inject 0.25 mLs (25 Units total) into the skin 2 (two) times daily with a meal.   INSULIN SYRINGE .3CC/31GX5/16" 31G X 5/16" 0.3 ML Misc 25 Units by Does not apply route 2 (two) times daily.   metroNIDAZOLE 500 MG tablet Commonly known as:  FLAGYL Take 1 tablet (500 mg total) by mouth every 8 (eight) hours for 7 days.   oxyCODONE 5 MG immediate release tablet Commonly known as:  Oxy IR/ROXICODONE Take 1 tablet (5 mg total) by mouth every 4 (four) hours as needed for moderate pain.   pantoprazole 40 MG tablet Commonly known as:  PROTONIX Take 1 tablet (40 mg total) by mouth daily. Start taking on:  05/15/2017      Allergies  Allergen Reactions  . Augmentin [Amoxicillin-Pot Clavulanate] Hives and Itching    Has patient had a PCN reaction causing immediate rash, facial/tongue/throat swelling, SOB or lightheadedness with hypotension: Yes Has patient had a PCN reaction causing severe rash involving mucus membranes or skin necrosis: No Has patient had a PCN reaction that required hospitalization: No Has patient had a PCN reaction occurring within the last 10 years: No If all of the above  answers are "NO", then may proceed with Cephalosporin use.   Follow-up Information    [place]. Go on 06/09/2017.   Why:  Arrive at 11 am, to establish PCP with no insurance.  Bring photo ID Contact information: [place] [address] [phone]       Carlena Hurl, PA-C Follow up on 05/25/2017.   Specialty:  General Surgery Why:  2:15pm, arrive no later than 1:45pm to check-in and do paperwork.  please bring insurance card and photo ID Contact information: [address] [place] [address] [phone]        Cleon Gustin, MD. Schedule an appointment as soon as possible for a visit in 2 week(s).   Specialty:   Urology Contact information: [address] [place] [address] [phone]            The results of significant diagnostics from this hospitalization (including imaging, microbiology, ancillary and laboratory) are listed below for reference.    Significant Diagnostic Studies: Ct Abdomen Pelvis W Contrast  Result Date: 05/08/2017 CLINICAL DATA:  Scrotal abscess, concern for necrotizing fasciitis EXAM: CT ABDOMEN AND PELVIS WITH CONTRAST TECHNIQUE: Multidetector CT imaging of the abdomen and pelvis was performed using the standard protocol following bolus administration of intravenous contrast. CONTRAST:  142m ISOVUE-300 IOPAMIDOL (ISOVUE-300) INJECTION 61% COMPARISON:  Scrotal ultrasound dated 05/08/2017 FINDINGS: Lower chest: Lung bases are clear. Hepatobiliary: Liver is within normal limits. Gallbladder is unremarkable. No intrahepatic or extrahepatic ductal dilatation. Pancreas: Within normal limits. Spleen: Within normal limits. Adrenals/Urinary Tract: Adrenal glands are within normal limits. Two nonobstructing right renal calculi measuring up to 2 mm in the left upper kidney (series 3/images 36 and 42). Right kidney is within normal limits. No hydronephrosis. Bladder is within normal limits. Stomach/Bowel: Stomach is within normal limits. No evidence of bowel obstruction. Normal appendix (series 3/image 72). Vascular/Lymphatic: No evidence of abdominal aortic aneurysm. No suspicious abdominopelvic lymphadenopathy. Right inguinal nodes measuring up to 1.7 cm short axis (series 3/image 101), likely reactive. Reproductive: Prostate is unremarkable. Scrotal wall thickening/edema, suggesting cellulitis. Associated 3.3 x 2.4 cm high right perineal/scrotal abscess (series 3/image 115). No soft tissue gas to suggest necrotizing fasciitis. Other: No abdominopelvic ascites. Musculoskeletal: Mild degenerative changes of the lower thoracic spine. IMPRESSION: 3.3 x 2.4 cm high right perineal/scrotal  abscess. Associated scrotal wall thickening/edema, suggesting cellulitis. No soft tissue gas to suggest necrotizing fasciitis. Two nonobstructing left renal calculi measuring up to 2 mm. No hydronephrosis. Electronically Signed   By: SJulian HyM.D.   On: 05/08/2017 19:31   [place] W/doppler  Result Date: 05/08/2017 CLINICAL DATA:  Right-sided scrotal swelling for several days EXAM: SCROTAL ULTRASOUND DOPPLER ULTRASOUND OF THE TESTICLES TECHNIQUE: Complete ultrasound examination of the testicles, epididymis, and other scrotal structures was performed. Color and spectral Doppler ultrasound were also utilized to evaluate blood flow to the testicles. COMPARISON:  None. FINDINGS: Right testicle Measurements: 4.4 x 1.9 x 2.9 cm. No mass or microlithiasis visualized. Left testicle Measurements: 3.8 x 1.8 x 2.4 cm. No mass or microlithiasis visualized. Right epididymis: There is a cyst measuring 0.4 x 0.3 x 0.3 cm in the head of the epididymis on the right. No inflammatory focus. Left epididymis: Normal in size and appearance. No inflammatory focus. Hydrocele:  None visualized. Varicocele:  None visualized. Pulsed Doppler interrogation of both testes demonstrates normal low resistance arterial and venous waveforms bilaterally. There is diffuse skin and subcutaneous thickening throughout  the right scrotal region. A lesser degree of thickening is noted on the left. No well-defined scrotal abscess by ultrasound. IMPRESSION: 1. Subcutaneous and skin thickening throughout the scrotum, more severe on the right than on the left, findings felt to represent cellulitis. No well-defined abscess appreciable. 2. Small cyst head of the epididymis on the right. No other mass lesion evident. 3. No orchitis or epididymitis. No testicular torsion on either side. Electronically Signed   By: Lowella Grip III M.D.   On: 05/08/2017 18:03    Microbiology: Recent Results (from the past 240 hour(s))  Blood culture (routine  x 2)     Status: None   Collection Time: 05/08/17  6:33 PM  Result Value Ref Range Status   Specimen Description BLOOD LEFT ANTECUBITAL  Final   Special Requests   Final    BOTTLES DRAWN AEROBIC AND ANAEROBIC Blood Culture adequate volume   Culture   Final    NO GROWTH 5 DAYS Performed at [facility], [address], [place], [place] [address]    Report Status 05/13/2017 FINAL  Final  Blood culture (routine x 2)     Status: None   Collection Time: 05/08/17  6:47 PM  Result Value Ref Range Status   Specimen Description BLOOD LEFT HAND  Final   Special Requests   Final    BOTTLES DRAWN AEROBIC AND ANAEROBIC Blood Culture adequate volume   Culture   Final    NO GROWTH 5 DAYS Performed at [facility], [place] [address], [place], [place] [address]    Report Status 05/13/2017 FINAL  Final  Urine culture     Status: None   Collection Time: 05/08/17  9:25 PM  Result Value Ref Range Status   Specimen Description URINE, CLEAN CATCH  Final   Special Requests NONE  Final   Culture   Final    NO GROWTH Performed at [facility], [place] [address] [place], [place] [address]    Report Status 05/10/2017 FINAL  Final  Surgical pcr screen     Status: Abnormal   Collection Time: 05/09/17  3:35 AM  Result Value Ref Range Status   MRSA, PCR NEGATIVE NEGATIVE Final   Staphylococcus aureus POSITIVE (A) NEGATIVE Final    Comment: (NOTE) The Xpert SA Assay (FDA approved for NASAL specimens in patients 76 years of age and older), is one component of a comprehensive surveillance program. It is not intended to diagnose infection nor to guide or monitor treatment.   Aerobic/Anaerobic Culture (surgical/deep wound)     Status: None   Collection Time: 05/09/17  8:20 AM  Result Value Ref Range Status   Specimen Description ABSCESS RIGHT GROIN  Final   Special Requests NONE  Final   Gram Stain   Final    ABUNDANT WBC PRESENT, PREDOMINANTLY PMN ABUNDANT GRAM POSITIVE RODS ABUNDANT GRAM  NEGATIVE RODS MODERATE GRAM POSITIVE COCCI IN PAIRS IN CHAINS    Culture   Final    MODERATE GROUP B STREP(S.AGALACTIAE)ISOLATED TESTING AGAINST S. AGALACTIAE NOT ROUTINELY PERFORMED DUE TO PREDICTABILITY OF AMP/PEN/VAN SUSCEPTIBILITY. MODERATE PREVOTELLA BIVIA BETA LACTAMASE POSITIVE Performed at [facility], [place] [address], [place], [place] [address]    Report Status 05/14/2017 FINAL  Final     Labs: Basic Metabolic Panel: Recent Labs  Lab 05/10/17 0628 05/11/17 0903 05/12/17 0612 05/13/17 0540 05/14/17 0315  NA 135 133* 132* 135 137  K 3.9 2.8* 3.3* 3.9 3.6  CL 101  99* 100* 104 103  CO2 23 22 22 22 24   GLUCOSE 271* 181* 165* 260* 156*  BUN 12 9 8 13 15   CREATININE 0.73 0.63 0.64 0.70 0.84  CALCIUM 8.7* 8.1* 7.8* 8.1* 8.1*  MG  --   --  1.7 2.4  --    Liver Function Tests: Recent Labs  Lab 05/08/17 1711  AST 14*  ALT 16*  ALKPHOS 108  BILITOT 1.0  PROT 8.4*  ALBUMIN 3.5   No results for input(s): LIPASE, AMYLASE in the last 168 hours. No results for input(s): AMMONIA in the last 168 hours. CBC: Recent Labs  Lab 05/08/17 1711 05/10/17 0628 05/11/17 0903 05/12/17 0612 05/13/17 0540 05/14/17 0315  WBC 18.3* 18.2* 19.8* 20.1* 16.7* 12.2*  NEUTROABS 13.5* 13.8* 14.6* 14.5*  --  6.6  HGB 15.7 12.8* 11.6* 12.0* 12.1* 11.9*  HCT 44.1 37.5* 33.3* 34.9* 36.0* 35.2*  MCV 85.0 87.2 84.3 85.1 86.1 86.5  PLT 338 306 309 319 364 382   Cardiac Enzymes: No results for input(s): CKTOTAL, CKMB, CKMBINDEX, TROPONINI in the last 168 hours. BNP: BNP (last 3 results) No results for input(s): BNP in the last 8760 hours.  ProBNP (last 3 results) No results for input(s): PROBNP in the last 8760 hours.  CBG: Recent Labs  Lab 05/13/17 1152 05/13/17 1659 05/13/17 2028 05/14/17 0751 05/14/17 1159  GLUCAP 223* 225* 186* 136* 130*       Signed:  Irine Seal MD.  [facility] 05/14/2017, 1:44 PM

## 2017-05-18 DIAGNOSIS — R739 Hyperglycemia, unspecified: Secondary | ICD-10-CM

## 2017-05-21 ENCOUNTER — Ambulatory Visit (INDEPENDENT_AMBULATORY_CARE_PROVIDER_SITE_OTHER): Payer: Medicaid Other | Admitting: Internal Medicine

## 2017-05-21 VITALS — BP 126/88 | HR 102 | Temp 98.0°F | Ht 72.0 in | Wt 233.2 lb

## 2017-05-21 DIAGNOSIS — N492 Inflammatory disorders of scrotum: Secondary | ICD-10-CM | POA: Diagnosis present

## 2017-05-21 DIAGNOSIS — B951 Streptococcus, group B, as the cause of diseases classified elsewhere: Secondary | ICD-10-CM

## 2017-05-21 DIAGNOSIS — Z79899 Other long term (current) drug therapy: Secondary | ICD-10-CM | POA: Diagnosis not present

## 2017-05-21 DIAGNOSIS — Z794 Long term (current) use of insulin: Secondary | ICD-10-CM

## 2017-05-21 DIAGNOSIS — Z978 Presence of other specified devices: Secondary | ICD-10-CM | POA: Diagnosis not present

## 2017-05-21 DIAGNOSIS — E1142 Type 2 diabetes mellitus with diabetic polyneuropathy: Secondary | ICD-10-CM

## 2017-05-21 DIAGNOSIS — G6289 Other specified polyneuropathies: Secondary | ICD-10-CM

## 2017-05-21 DIAGNOSIS — Z79891 Long term (current) use of opiate analgesic: Secondary | ICD-10-CM | POA: Diagnosis not present

## 2017-05-21 DIAGNOSIS — E1165 Type 2 diabetes mellitus with hyperglycemia: Secondary | ICD-10-CM

## 2017-05-21 DIAGNOSIS — Z87891 Personal history of nicotine dependence: Secondary | ICD-10-CM

## 2017-05-21 DIAGNOSIS — G629 Polyneuropathy, unspecified: Secondary | ICD-10-CM

## 2017-05-21 HISTORY — DX: Polyneuropathy, unspecified: G62.9

## 2017-05-21 MED ORDER — CEPHALEXIN 500 MG PO CAPS: 500.0000 mg | ORAL_CAPSULE | Freq: Four times a day (QID) | ORAL | 0 refills | Status: DC

## 2017-05-21 MED ORDER — OXYCODONE-ACETAMINOPHEN 5-325 MG PO TABS: 1.0000 | ORAL_TABLET | Freq: Two times a day (BID) | ORAL | 0 refills | Status: DC | PRN

## 2017-05-21 MED ORDER — CEPHALEXIN 500 MG PO CAPS: 500.0000 mg | ORAL_CAPSULE | Freq: Four times a day (QID) | ORAL | 0 refills | Status: AC

## 2017-05-21 NOTE — Assessment & Plan Note (Signed)
Patient is here for hospital follow-up for right groin/inguinal scrotal abscess requiring surgical I&D.  He was initially taken to the OR on 4/14 for debridement and irrigation with drain placement.  He was taken back to the OR on 4/17 due to persistent fevers, progression of swelling, and fluctuance despite initial I&D and broad spectrum IV abx. He was found to have a complex abscess requiring further I&D and new drain placement. He did well post operatively and was discharged on a 1 week course of Keflex and Flagyl.  He reports compliance with these medications.  He denies fevers or systemic symptoms such as chills, nausea, vomiting.  Reports his pain is improving.  Surgical cultures ultimately grew group B strep.  On exam today his surgical drain is in place with minimal drainage.  There is some surrounding erythema but overall appears to be healing.  We will continue antibiotics for an additional week.  Can discontinue Flagyl based on culture results. --Continue Keflex 500 every 4 for an additional 7 days --Stop Flagyl --Follow-up with general surgery scheduled 4/30 --Follow-up with [facility] in 2 weeks --Refilled percocet 5-325 mg BID prn (#10)

## 2017-05-21 NOTE — Progress Notes (Signed)
Internal Medicine Clinic Attending  I saw and evaluated the patient.  I personally confirmed the key portions of the history and exam documented by Dr. Guilloud and I reviewed pertinent patient test results.  The assessment, diagnosis, and plan were formulated together and I agree with the documentation in the resident's note.  

## 2017-05-21 NOTE — Assessment & Plan Note (Signed)
Secondary to uncontrolled DM. Symptoms controlled with gabapentin 300 mg BID.  -- Continue current regimen

## 2017-05-21 NOTE — Assessment & Plan Note (Addendum)
Patient was "newly" diagnosed with diabetes during recent hospitalization.  His CBGs were persistently elevated and A1c 13.1.  On chart review, it appears he was prescribed insulin and metformin from our clinic back in 2015. He fell out of care and has since been off treatment.  He is uninsured and was discharged from the hospital on NovoLog 70/30 25 units twice daily.  He does not have a meter currently and has not been checking his blood sugars.  Reports he is picking up a glucometer today.  We will continue with current regimen.  Instructed patient to CBG 4 times daily (AM fasting & w/ meals).   -- Follow up 2 weeks with glucometer  -- Continue Novolog 70/30 25 units BID -- Patient has normal renal function, would recommend starting metformin at follow up

## 2017-05-21 NOTE — Patient Instructions (Signed)
FOLLOW-UP INSTRUCTIONS When: 2 weeks For: Abscess f/u & diabetes  What to bring: Medications, glucometer   Shaun Cummings,  It was a pleasure to meet you. I have given you another week of Keflex. Please continue to take this 4 times daily as previously prescribed. Please continue to use your 70/30 insulin twice daily. Check your blood sugar once in the morning prior to eating, then three times a day after meals. Please follow up with [place] again in 2 weeks and bring your glucometer. If you have any questions or concerns, call our clinic at [phone] or after hours call [phone] and ask for the internal medicine resident on call. Thank you!  - Dr. Philipp Ovens

## 2017-05-21 NOTE — Progress Notes (Signed)
   CC: Scrotal abscess follow up  HPI:  [name] is a 42 y.o. male with past medical history outlined below here for scrotal abscess follow up. For the details of today's visit, please refer to the assessment and plan.  Past Medical History:  Diagnosis Date  . Hyperlipidemia   . Lesion of penis    foreskin  . OSA (obstructive sleep apnea)    per pt dx osa and used cpap but after losing wt. from 415 pounds down to 244 pounds no longer needs cpap  . Peripheral neuropathy   . Phimosis   . Type 2 diabetes mellitus (HCC)    per pt no meds for two years pt was changed to levemir unable to afford but pt states is waiting on approval for a program he applied for that will pay for his meds [facility])      Review of Systems  Constitutional: Negative for chills and fever.  Respiratory: Negative for shortness of breath.   Cardiovascular: Negative for chest pain.  Gastrointestinal: Negative for abdominal pain, nausea and vomiting.  Genitourinary: Negative for dysuria.  Skin: Negative for rash.  Neurological: Negative for dizziness and headaches.  Endo/Heme/Allergies: Negative for polydipsia.  Psychiatric/Behavioral: Negative for depression.    Family History: Mom & Dad both with CAD and DM.    Social History: Former smoker x 5 years. Quit 2010. Denies alcohol and illicit substance use.    Physical Exam:  Vitals:   05/21/17 0927  BP: 126/88  Pulse: (!) 102  Temp: 98 F (36.7 C)  TempSrc: Oral  SpO2: 96%  Weight: 233 lb 3.2 oz (105.8 kg)  Height: 6' (1.829 m)    Constitutional: NAD, appears comfortable HEENT: Atraumatic, normocephalic. PERRL, anicteric sclera. Moist mucous membranes.  Cardiovascular: RRR, no murmurs, rubs, or gallops.  Pulmonary/Chest: CTAB, no wheezes, rales, or rhonchi.  Abdominal: Soft, non tender, non distended. +BS.  GU: Right groin / inguinal scrotal abscess with surgical drain in place, surrounding erythema, minimal drainage    Extremities: Warm and well perfused. No edema.  Neurological: A&Ox3, CN II - XII grossly intact.  Skin: No rashes or erythema  Psychiatric: Normal mood and affect  Assessment & Plan:   See Encounters Tab for problem based charting.  Patient seen with Dr. Evette Doffing

## 2017-05-28 ENCOUNTER — Telehealth: Payer: Self-pay | Admitting: *Deleted

## 2017-05-28 NOTE — Telephone Encounter (Signed)
Call to patient to ask about insurance coverage for meds so that a PA could be completed.  Shaun Cummings stated pain for medication out of pocket.  Has no Insurance.  Patient also asked about next appointment.  Patient was informed of his appointment 06/04/2017 @ 9:45 AM.  Sander Nephew, RN 05/28/2017 9:57 AM.

## 2017-06-04 ENCOUNTER — Ambulatory Visit (INDEPENDENT_AMBULATORY_CARE_PROVIDER_SITE_OTHER): Payer: Self-pay | Admitting: Internal Medicine

## 2017-06-04 ENCOUNTER — Other Ambulatory Visit: Payer: Self-pay

## 2017-06-04 VITALS — BP 130/86 | HR 84 | Temp 98.1°F | Ht 72.0 in | Wt 244.1 lb

## 2017-06-04 DIAGNOSIS — Z79891 Long term (current) use of opiate analgesic: Secondary | ICD-10-CM

## 2017-06-04 DIAGNOSIS — Z794 Long term (current) use of insulin: Secondary | ICD-10-CM

## 2017-06-04 DIAGNOSIS — Z791 Long term (current) use of non-steroidal anti-inflammatories (NSAID): Secondary | ICD-10-CM

## 2017-06-04 DIAGNOSIS — E1169 Type 2 diabetes mellitus with other specified complication: Secondary | ICD-10-CM

## 2017-06-04 DIAGNOSIS — E1165 Type 2 diabetes mellitus with hyperglycemia: Secondary | ICD-10-CM

## 2017-06-04 DIAGNOSIS — N492 Inflammatory disorders of scrotum: Secondary | ICD-10-CM

## 2017-06-04 MED ORDER — OXYCODONE-ACETAMINOPHEN 5-325 MG PO TABS: 1.0000 | ORAL_TABLET | Freq: Two times a day (BID) | ORAL | 0 refills | Status: DC | PRN

## 2017-06-04 MED ORDER — MELOXICAM 7.5 MG PO TABS: 15.0000 mg | ORAL_TABLET | Freq: Every day | ORAL | 0 refills | Status: DC

## 2017-06-04 NOTE — Progress Notes (Signed)
CC: Scrotal abscess and diabetes  HPI:[name] is a 42 y.o. male who presents today for evaluation of his poorly controlled diabetes and recurrent scrotal abscess.  Diabetes: Hgb A1c 13.1 on 05/09/2017.  The patient brings with him today and he is glucometer for evaluation.  Review indicates averages exceeding 300 with regular morning highs.  The patient stated he does not often inject his second daily dose of his 7030 mix.  I feel that this is the most likely reason his glucose remains elevated.  He was further advised that poorly controlled diabetes increases the odds that an abscess is less likely to heal and more likely to become infected in the future.  He was advised multiple times that close glycemic control will optimize his chances for healing the scrotal abscess and reduce his risk for reinfection and risk of systemic infection.  The patient agreed to take his insulin as prescribed.  Plan: Advised the patient to take his insulin twice daily and to check his blood glucose at least in the morning and with each evening meal prior to his to insulin doses and preferably at noon and in the evening as well.  He agreed to stop by May 28th or 29th to download his glucometer readings for me to evaluate.  If his glucose remains elevated he is compliant with medications of consider dose adjustment as indicated based on his glucometer readings. Microalbumin to creatinine ratio 23.1.  This is considered within the range of normal  Scrotal abscess: Patient follows with urology for scrotal abscess.  He recently completed short course of antibiotics x14 days prescribed by his urologist who also informed him that the abscess appears to be healing well.  He was advised to continue following with his urologist.  The abscess appears to be improving with absence of erythema and edema surrounding the site.  Patient denies fever, chills, nausea, vomiting, diarrhea, cuts patient, hematuria, hematemesis,  hematochezia, myalgias, chest pain, abdominal pain, visual changes, or headache.  Plan: Continue oxycodone acetaminophen 5/325 twice daily for 5 days Meloxicam 50 mg daily x 10 days. He is to schedule an appointment if his pain persists past the current scheduled pain medication regimen for the 28th or 29th when he is to drop off his glucometer readings.  Past Medical History:  Diagnosis Date  . Hyperlipidemia   . Lesion of penis    foreskin  . OSA (obstructive sleep apnea)    per pt dx osa and used cpap but after losing wt. from 415 pounds down to 244 pounds no longer needs cpap  . Peripheral neuropathy   . Phimosis   . Type 2 diabetes mellitus (HCC)    per pt no meds for two years pt was changed to levemir unable to afford but pt states is waiting on approval for a program he applied for that will pay for his meds [facility])     Review of Systems: ROS negative except as per HPI  Physical Exam:  Vitals:   06/04/17 1025  BP: 130/86  Pulse: 84  Temp: 98.1 F (36.7 C)  TempSrc: Oral  SpO2: 98%  Weight: 244 lb 1.6 oz (110.7 kg)  Height: 6' (1.829 m)   Physical Exam  Constitutional: He appears well-developed and well-nourished. No distress.  Eyes: Pupils are equal, round, and reactive to light. Conjunctivae and EOM are normal.  Cardiovascular: Normal rate and regular rhythm.  No murmur heard. Pulmonary/Chest: Effort normal and breath sounds normal. No stridor. No respiratory  distress.  Abdominal: Soft. Bowel sounds are normal. He exhibits no distension.  Genitourinary: Right testis shows tenderness. Right testis shows no swelling. Left testis shows tenderness. Left testis shows no swelling.  Musculoskeletal: He exhibits no edema or tenderness.  Skin: He is not diaphoretic.  Psychiatric: He has a normal mood and affect.   Assessment & Plan:   See Encounters Tab for problem based charting.  Patient discussed with Dr. Dareen Piano

## 2017-06-04 NOTE — Patient Instructions (Addendum)
FOLLOW-UP INSTRUCTIONS When: Please stop by and allow [place] to download your glucometer readings on May 28-29th What to bring: Glucometer  Please continue to monitor your blood glucose at least twice daily but preferably every morning, at noon, prior to evening meal, and before bed.  The more data we have available the better we will be able to adjust your insulin to control your diabetes.  The scrotal abscess appears to be healing but will need tight glucose control in order to effectively and more rapidly heal.  Please continue to follow with urology for this.  I have provided you with a prescription for meloxicam 15 mg to be taken daily for 10 days.  This is very similar to ibuprofen but is a 1 time daily dosing and as such should be easier to take. I provided an additional 5-day course of the Percocet 5/325 to be taken as needed for pain.  In addition you may take an additional 500 mg acetaminophen up to 3 times per day to help control this pain. If this dose not control your pain, please schedule an appointment for May 28-29th for additional evaluation.   If you have any questions, develop fever, chills, nausea, vomiting, if the pain persists, worsening scrotal pain abdominal swelling or redness of the scrotum or other concerning symptom please notify us immediately.  Thank you for your visit to [name] today

## 2017-06-05 LAB — MICROALBUMIN / CREATININE URINE RATIO
Creatinine, Urine: 91.9 mg/dL
Microalb/Creat Ratio: 23.1 mg/g creat (ref 0.0–30.0)
Microalbumin, Urine: 21.2 ug/mL

## 2017-06-07 NOTE — Progress Notes (Signed)
Internal Medicine Clinic Attending  Case discussed with Dr. Harbrecht at the time of the visit.  We reviewed the resident's history and exam and pertinent patient test results.  I agree with the assessment, diagnosis, and plan of care documented in the resident's note.   

## 2017-06-07 NOTE — Assessment & Plan Note (Addendum)
Diabetes: Hgb A1c 13.1 on 05/09/2017.  The patient brings with him today and he is glucometer for evaluation.  Review indicates averages exceeding 300 with regular morning highs.  The patient stated he does not often inject his second daily dose of his 7030 mix.  I feel that this is the most likely reason his glucose remains elevated.  He was further advised that poorly controlled diabetes increases the odds that an abscess is less likely to heal and more likely to become infected in the future.  He was advised multiple times that close glycemic control will optimize his chances for healing the scrotal abscess and reduce his risk for reinfection and risk of systemic infection.  The patient agreed to take his insulin as prescribed. Microalbumin to creatinine ratio 23.1.  This is considered within the range of normal  Plan: Advised the patient to take his insulin twice daily and to check his blood glucose at least in the morning and with each evening meal prior to his to insulin doses and preferably at noon and in the evening as well.  He agreed to stop by May 28th or 29th to download his glucometer readings for me to evaluate.  If his glucose remains elevated he is compliant with medications of consider dose adjustment as indicated based on his glucometer readings.

## 2017-06-07 NOTE — Assessment & Plan Note (Signed)
Scrotal abscess: Patient follows with urology for scrotal abscess.  He recently completed short course of antibiotics x14 days prescribed by his urologist who also informed him that the abscess appears to be healing well.  He was advised to continue following with his urologist.  The abscess appears to be improving with absence of erythema and edema surrounding the site.  Patient denies fever, chills, nausea, vomiting, diarrhea, cuts patient, hematuria, hematemesis, hematochezia, myalgias, chest pain, abdominal pain, visual changes, or headache.  Plan: Continue oxycodone acetaminophen 5/325 twice daily for 5 days Meloxicam 50 mg daily x 10 days. He is to schedule an appointment if his pain persists past the current scheduled pain medication regimen for the 28th or 29th when he is to drop off his glucometer readings.

## 2017-06-09 ENCOUNTER — Ambulatory Visit (INDEPENDENT_AMBULATORY_CARE_PROVIDER_SITE_OTHER): Payer: Self-pay | Admitting: Nurse Practitioner

## 2017-06-11 ENCOUNTER — Ambulatory Visit: Payer: Self-pay

## 2017-07-01 ENCOUNTER — Ambulatory Visit: Payer: Self-pay

## 2017-07-01 ENCOUNTER — Other Ambulatory Visit: Payer: Self-pay | Admitting: Internal Medicine

## 2017-07-01 ENCOUNTER — Encounter: Payer: Self-pay | Admitting: Internal Medicine

## 2017-07-01 MED ORDER — GABAPENTIN 300 MG PO CAPS: 300.0000 mg | ORAL_CAPSULE | Freq: Three times a day (TID) | ORAL | 0 refills | Status: DC

## 2017-07-01 MED ORDER — INSULIN ASPART PROT & ASPART (70-30 MIX) 100 UNIT/ML ~~LOC~~ SUSP: 25.0000 [IU] | Freq: Two times a day (BID) | SUBCUTANEOUS | 1 refills | Status: DC

## 2017-07-01 MED ORDER — "INSULIN SYRINGE 31G X 5/16"" 0.3 ML MISC": 25.0000 [IU] | Freq: Two times a day (BID) | 0 refills | Status: DC

## 2017-07-01 NOTE — Telephone Encounter (Signed)
Patient is requesting refill on diabetes medicine, pls review patient chart to refill any other medicine.  pls send to [facility] on [address]

## 2017-07-12 ENCOUNTER — Telehealth: Payer: Self-pay | Admitting: Internal Medicine

## 2017-07-12 NOTE — Telephone Encounter (Signed)
I will forward this to Dr. Maudie Mercury and see if she can help with the insulin an pricing.  Dr. Maudie Mercury, I am not sure why there was the price difference. Please advise  Thank you  Rivka Safer

## 2017-07-12 NOTE — Telephone Encounter (Signed)
Patient Shaun Cummings is 900.00 cant afford, the pharmacy told him before he paid 30.00 that was a one time thing, pls call patient

## 2017-07-21 NOTE — Telephone Encounter (Addendum)
Hi Dr. Berline Lopes, patient does not have insurance. I'm not sure where he got the insulin for $30. We can give him samples but the best way for him to continue access is either [facility] or [place] [facility]. Also, it looks like patient should be on metformin---would you like me to work with him or have him seen in [facility]?  Thank you

## 2017-08-05 NOTE — Telephone Encounter (Signed)
Spoke w/ deborahh. She will call pt and schedule appt for financial assist

## 2017-09-08 ENCOUNTER — Ambulatory Visit (INDEPENDENT_AMBULATORY_CARE_PROVIDER_SITE_OTHER): Payer: Self-pay | Admitting: Pharmacist

## 2017-09-08 ENCOUNTER — Ambulatory Visit: Payer: Self-pay

## 2017-09-08 DIAGNOSIS — E1165 Type 2 diabetes mellitus with hyperglycemia: Secondary | ICD-10-CM

## 2017-09-08 LAB — POCT GLYCOSYLATED HEMOGLOBIN (HGB A1C)

## 2017-09-08 LAB — GLUCOSE, CAPILLARY: Glucose-Capillary: 341 mg/dL — ABNORMAL HIGH (ref 70–99)

## 2017-09-08 NOTE — Progress Notes (Signed)
S: Shaun Cummings is a 42 y.o. male reports to clinical pharmacist appointment for diabetes management.  Allergies  Allergen Reactions  . Augmentin [Amoxicillin-Pot Clavulanate] Hives and Itching    Has patient had a PCN reaction causing immediate rash, facial/tongue/throat swelling, SOB or lightheadedness with hypotension: Yes Has patient had a PCN reaction causing severe rash involving mucus membranes or skin necrosis: No Has patient had a PCN reaction that required hospitalization: No Has patient had a PCN reaction occurring within the last 10 years: No If all of the above answers are "NO", then may proceed with Cephalosporin use.   Prior to Admission medications   Medication Sig  Insulin Degludec-Liraglutide (XULTOPHY) 100-3.6 UNIT-MG/ML SOPN Inject 20 Units into the skin daily.  acetaminophen (TYLENOL) 325 MG tablet Take 325 mg by mouth every 6 (six) hours as needed for headache (pain).  blood glucose meter kit and supplies Dispense based on patient and insurance preference. Use up to four times daily as directed. (FOR ICD-10 E10.9, E11.9).  gabapentin (NEURONTIN) 300 MG capsule Take 1 capsule (300 mg total) by mouth 3 (three) times daily.  Insulin Syringe-Needle U-100 (INSULIN SYRINGE .3CC/31GX5/16") 31G X 5/16" 0.3 ML MISC 25 Units by Does not apply route 2 (two) times daily.  meloxicam (MOBIC) 7.5 MG tablet Take 2 tablets (15 mg total) by mouth daily.  oxyCODONE-acetaminophen (PERCOCET) 5-325 MG tablet Take 1 tablet by mouth 2 (two) times daily as needed for severe pain.   Past Medical History:  Diagnosis Date  . Hyperlipidemia   . Lesion of penis    foreskin  . OSA (obstructive sleep apnea)    per pt dx osa and used cpap but after losing wt. from 415 pounds down to 244 pounds no longer needs cpap  . Peripheral neuropathy   . Phimosis   . Type 2 diabetes mellitus (HCC)    per pt no meds for two years pt was changed to levemir unable to afford but pt states is waiting on  approval for a program he applied for that will pay for his meds [facility])     Social History   Socioeconomic History  . Marital status: Married    Spouse name: Not on file  . Number of children: 2  . Years of education: college   . Highest education level: Not on file  Occupational History  . Occupation: Group Home   Social Needs  . Financial resource strain: Not on file  . Food insecurity:    Worry: Not on file    Inability: Not on file  . Transportation needs:    Medical: Not on file    Non-medical: Not on file  Tobacco Use  . Smoking status: Former Smoker    Years: 2.00    Types: Cigarettes    Last attempt to quit: 02/05/2008    Years since quitting: 9.5  . Smokeless tobacco: Never Used  Substance and Sexual Activity  . Alcohol use: No  . Drug use: No  . Sexual activity: Not on file  Lifestyle  . Physical activity:    Days per week: Not on file    Minutes per session: Not on file  . Stress: Not on file  Relationships  . Social connections:    Talks on phone: Not on file    Gets together: Not on file    Attends religious service: Not on file    Active member of club or organization: Not on file    Attends meetings of  clubs or organizations: Not on file    Relationship status: Not on file  Other Topics Concern  . Not on file  Social History Narrative   Lives with wife and 2 kids, age 56 and 15.    Family History  Problem Relation Age of Onset  . Diabetes Mother   . Diabetes Father   . Diabetes Sister   . Diabetes Brother   . Diabetes Maternal Grandmother   . Diabetes Maternal Grandfather   . Diabetes Paternal Grandmother   . Diabetes Paternal Grandfather    O:    Component Value Date/Time   CHOL 215 (H) 09/15/2013 1727   HDL 28 (L) 09/15/2013 1727   TRIG 670 (H) 09/15/2013 1727   AST 14 (L) 05/08/2017 1711   ALT 16 (L) 05/08/2017 1711   NA 137 05/14/2017 0315   K 3.6 05/14/2017 0315   CL 103 05/14/2017 0315   CO2 24 05/14/2017 0315    GLUCOSE 156 (H) 05/14/2017 0315   HGBA1C >14.0 (A) 09/08/2017 1211   BUN 15 05/14/2017 0315   CREATININE 0.84 05/14/2017 0315   CREATININE 0.91 09/15/2013 1727   CALCIUM 8.1 (L) 05/14/2017 0315   GFRNONAA >60 05/14/2017 0315   GFRAA >60 05/14/2017 0315   WBC 12.2 (H) 05/14/2017 0315   HGB 11.9 (L) 05/14/2017 0315   HCT 35.2 (L) 05/14/2017 0315   PLT 382 05/14/2017 0315   TSH 1.111 09/15/2013 1738   Ht Readings from Last 2 Encounters:  06/04/17 6' (1.829 m)  05/21/17 6' (1.829 m)   Wt Readings from Last 2 Encounters:  06/04/17 244 lb 1.6 oz (110.7 kg)  05/21/17 233 lb 3.2 oz (105.8 kg)   There is no height or weight on file to calculate BMI. BP Readings from Last 3 Encounters:  06/04/17 130/86  05/21/17 126/88  05/14/17 (!) 147/92   A/P: Patient is currently on Novolog Mix 70/30 25 units twice daily. Patient reports he is uninsured and is unable to pay the co-pay for his insulin. Patient also reports that he has not been monitoring blood glucose readings. Patient is uncontrolled on this regimen as A1C today was > 14.   As current routine is subtherapeutic, discontinue Novolog Mix 70/30. Will initiate Xultophy 20 units daily. Patient educated on insulin pen injection technique and importance of monitoring blood glucose. Patient instructed to follow up if he experiences any side effects and to make appointment with Dr. Flossie Dibble when follows up with PCP.  The patient verbalized understanding of information provided by repeating back concepts discussed.

## 2017-09-09 LAB — LIPID PANEL
CHOL/HDL RATIO: 8.1 ratio — AB (ref 0.0–5.0)
Cholesterol, Total: 244 mg/dL — ABNORMAL HIGH (ref 100–199)
HDL: 30 mg/dL — ABNORMAL LOW (ref >39)
LDL CALC: 155 mg/dL — AB (ref 0–99)
Triglycerides: 296 mg/dL — ABNORMAL HIGH (ref 0–149)
VLDL Cholesterol Cal: 59 mg/dL — ABNORMAL HIGH (ref 5–40)

## 2017-09-09 MED ORDER — INSULIN DEGLUDEC-LIRAGLUTIDE 100-3.6 UNIT-MG/ML ~~LOC~~ SOPN: 20.0000 [IU] | PEN_INJECTOR | Freq: Every day | SUBCUTANEOUS | 11 refills | Status: DC

## 2017-09-10 ENCOUNTER — Telehealth: Payer: Self-pay | Admitting: Student-PharmD

## 2017-09-10 ENCOUNTER — Other Ambulatory Visit: Payer: Self-pay | Admitting: Pharmacist

## 2017-09-10 DIAGNOSIS — E1165 Type 2 diabetes mellitus with hyperglycemia: Secondary | ICD-10-CM

## 2017-09-10 MED ORDER — GLUCOSE BLOOD VI STRP: ORAL_STRIP | 11 refills | Status: DC

## 2017-09-10 MED FILL — CONTOUR NEXT STRIPS: 25 days supply | Qty: 100 | Fill #0

## 2017-10-04 ENCOUNTER — Telehealth: Payer: Self-pay | Admitting: Internal Medicine

## 2017-10-04 NOTE — Telephone Encounter (Signed)
Pt missed called, pls call back [phone]

## 2017-10-04 NOTE — Progress Notes (Signed)
Spoke to patient on phone today. Patient says BG has improved while taking Xultophy. Mainly around 180-190. Still some numbers in 200's with highest at 260. Patient said he had no low BG.  Patient does confess continuing to have a poor diet. Offered counseling with donna (nurtritionist),  But patient said it will depend on cost since no insurance.   Patient said he needs more Xultophy so will give him another sample this week. Will update him at that time about speaking to Butch Penny and inquire about [employer] at [employer].  Acie Fredrickson, PharmD Candidate

## 2017-10-04 NOTE — Telephone Encounter (Signed)
Left message on patient's VM that missed call was most likely a notification of his upcoming appt with Dr. Maudie Mercury on 10/06/2017 at 1100. Hubbard Hartshorn, RN, BSN

## 2017-10-06 ENCOUNTER — Ambulatory Visit: Payer: Self-pay | Admitting: Pharmacist

## 2017-10-06 DIAGNOSIS — E1165 Type 2 diabetes mellitus with hyperglycemia: Secondary | ICD-10-CM

## 2017-10-06 NOTE — Progress Notes (Signed)
S: Shaun Cummings is a 42 y.o. male reports to clinical pharmacist appointment for diabetes med help.  Allergies  Allergen Reactions  . Augmentin [Amoxicillin-Pot Clavulanate] Hives and Itching    Has patient had a PCN reaction causing immediate rash, facial/tongue/throat swelling, SOB or lightheadedness with hypotension: Yes Has patient had a PCN reaction causing severe rash involving mucus membranes or skin necrosis: No Has patient had a PCN reaction that required hospitalization: No Has patient had a PCN reaction occurring within the last 10 years: No If all of the above answers are "NO", then may proceed with Cephalosporin use.   Medication Sig  acetaminophen (TYLENOL) 325 MG tablet Take 325 mg by mouth every 6 (six) hours as needed for headache (pain).  blood glucose meter kit and supplies Dispense based on patient and insurance preference. Use up to four times daily as directed. (FOR ICD-10 E10.9, E11.9).  gabapentin (NEURONTIN) 300 MG capsule Take 1 capsule (300 mg total) by mouth 3 (three) times daily.  glucose blood (CONTOUR NEXT TEST) test strip Test blood glucose up to 4 times daily  Insulin Degludec-Liraglutide (XULTOPHY) 100-3.6 UNIT-MG/ML SOPN Inject 20 Units into the skin daily.  Insulin Syringe-Needle U-100 (INSULIN SYRINGE .3CC/31GX5/16") 31G X 5/16" 0.3 ML MISC 25 Units by Does not apply route 2 (two) times daily.  meloxicam (MOBIC) 7.5 MG tablet Take 2 tablets (15 mg total) by mouth daily.  oxyCODONE-acetaminophen (PERCOCET) 5-325 MG tablet Take 1 tablet by mouth 2 (two) times daily as needed for severe pain.   Past Medical History:  Diagnosis Date  . Hyperlipidemia   . Lesion of penis    foreskin  . OSA (obstructive sleep apnea)    per pt dx osa and used cpap but after losing wt. from 415 pounds down to 244 pounds no longer needs cpap  . Peripheral neuropathy   . Phimosis   . Type 2 diabetes mellitus (HCC)    per pt no meds for two years pt was changed to levemir  unable to afford but pt states is waiting on approval for a program he applied for that will pay for his meds [facility])     Social History   Socioeconomic History  . Marital status: Married    Spouse name: Not on file  . Number of children: 2  . Years of education: college   . Highest education level: Not on file  Occupational History  . Occupation: Group Home   Social Needs  . Financial resource strain: Not on file  . Food insecurity:    Worry: Not on file    Inability: Not on file  . Transportation needs:    Medical: Not on file    Non-medical: Not on file  Tobacco Use  . Smoking status: Former Smoker    Years: 2.00    Types: Cigarettes    Last attempt to quit: 02/05/2008    Years since quitting: 9.6  . Smokeless tobacco: Never Used  Substance and Sexual Activity  . Alcohol use: No  . Drug use: No  . Sexual activity: Not on file  Lifestyle  . Physical activity:    Days per week: Not on file    Minutes per session: Not on file  . Stress: Not on file  Relationships  . Social connections:    Talks on phone: Not on file    Gets together: Not on file    Attends religious service: Not on file    Active member of club  or organization: Not on file    Attends meetings of clubs or organizations: Not on file    Relationship status: Not on file  Other Topics Concern  . Not on file  Social History Narrative   Lives with wife and 2 kids, age 55 and 77.    Family History  Problem Relation Age of Onset  . Diabetes Mother   . Diabetes Father   . Diabetes Sister   . Diabetes Brother   . Diabetes Maternal Grandmother   . Diabetes Maternal Grandfather   . Diabetes Paternal Grandmother   . Diabetes Paternal Grandfather    O:    Component Value Date/Time   CHOL 244 (H) 09/08/2017 1155   HDL 30 (L) 09/08/2017 1155   TRIG 296 (H) 09/08/2017 1155   AST 14 (L) 05/08/2017 1711   ALT 16 (L) 05/08/2017 1711   NA 137 05/14/2017 0315   K 3.6 05/14/2017 0315   CL 103  05/14/2017 0315   CO2 24 05/14/2017 0315   GLUCOSE 156 (H) 05/14/2017 0315   HGBA1C >14.0 (A) 09/08/2017 1211   BUN 15 05/14/2017 0315   CREATININE 0.84 05/14/2017 0315   CREATININE 0.91 09/15/2013 1727   CALCIUM 8.1 (L) 05/14/2017 0315   GFRNONAA >60 05/14/2017 0315   GFRAA >60 05/14/2017 0315   WBC 12.2 (H) 05/14/2017 0315   HGB 11.9 (L) 05/14/2017 0315   HCT 35.2 (L) 05/14/2017 0315   PLT 382 05/14/2017 0315   TSH 1.111 09/15/2013 1738   Ht Readings from Last 2 Encounters:  06/04/17 6' (1.829 m)  05/21/17 6' (1.829 m)   Wt Readings from Last 2 Encounters:  06/04/17 244 lb 1.6 oz (110.7 kg)  05/21/17 233 lb 3.2 oz (105.8 kg)   There is no height or weight on file to calculate BMI. BP Readings from Last 3 Encounters:  06/04/17 130/86  05/21/17 126/88  05/14/17 (!) 147/92    A/P: Patient is currently taking Xultophy 25 units daily. His BG have been in the 180s to 200s, advised patient to increase Xultophy to 28 units daily. Patient reports no signs or symptoms of concern today. He has noticed improved vision and energy. In the absence of severe hyperglycemia, we can reduce insulin and increase GLP-1 agonist dose/guideline based therapy in the future. Provided patient education. He is still establishing paperwork for charity care and [facility], so will follow up with patient via telephone. Will also coordinate addition of statin in the future.   An after visit summary was provided and patient advised to follow up in 10 days or sooner if any changes in condition or questions regarding medications arise.   The patient verbalized understanding of information provided by repeating back concepts discussed.

## 2017-10-20 ENCOUNTER — Telehealth: Payer: Self-pay | Admitting: Internal Medicine

## 2017-10-20 NOTE — Progress Notes (Addendum)
Called patient for follow-up on initiating Xultophy. Patient said BG levels have increased to around 260-280. Patient did state that he missed 1-2 doses of Xultophy. I clarified with patient that he can still take a missed dose if he remembers around lunch time. He also states that he has cut sodas from his diet.  Patient is still trying to get [employer] for [employer]. He emailed the information to the clinic. I told him he needs to print copies and bring them to the clinic at next appointment.  Follow-up appointment scheduled next week with patient. Patient agreed to be switched to Victoza.  Acie Fredrickson, PharmD Candidate

## 2017-10-26 ENCOUNTER — Encounter (INDEPENDENT_AMBULATORY_CARE_PROVIDER_SITE_OTHER): Payer: Self-pay

## 2017-10-26 ENCOUNTER — Ambulatory Visit: Payer: Self-pay | Admitting: Pharmacist

## 2017-10-26 DIAGNOSIS — E1165 Type 2 diabetes mellitus with hyperglycemia: Secondary | ICD-10-CM

## 2017-10-26 NOTE — Progress Notes (Addendum)
S: Shaun Cummings is a 42 y.o. male reports to clinical pharmacist appointment for diabetes medication help  Allergies  Allergen Reactions  . Augmentin [Amoxicillin-Pot Clavulanate] Hives and Itching    Has patient had a PCN reaction causing immediate rash, facial/tongue/throat swelling, SOB or lightheadedness with hypotension: Yes Has patient had a PCN reaction causing severe rash involving mucus membranes or skin necrosis: No Has patient had a PCN reaction that required hospitalization: No Has patient had a PCN reaction occurring within the last 10 years: No If all of the above answers are "NO", then may proceed with Cephalosporin use.   Medication Sig  acetaminophen (TYLENOL) 325 MG tablet Take 325 mg by mouth every 6 (six) hours as needed for headache (pain).  blood glucose meter kit and supplies Dispense based on patient and insurance preference. Use up to four times daily as directed. (FOR ICD-10 E10.9, E11.9).  gabapentin (NEURONTIN) 300 MG capsule Take 1 capsule (300 mg total) by mouth 3 (three) times daily.  glucose blood (CONTOUR NEXT TEST) test strip Test blood glucose up to 4 times daily  Insulin Syringe-Needle U-100 (INSULIN SYRINGE .3CC/31GX5/16") 31G X 5/16" 0.3 ML MISC 25 Units by Does not apply route 2 (two) times daily.  meloxicam (MOBIC) 7.5 MG tablet Take 2 tablets (15 mg total) by mouth daily.  oxyCODONE-acetaminophen (PERCOCET) 5-325 MG tablet Take 1 tablet by mouth 2 (two) times daily as needed for severe pain.   Past Medical History:  Diagnosis Date  . Hyperlipidemia   . Lesion of penis    foreskin  . OSA (obstructive sleep apnea)    per pt dx osa and used cpap but after losing wt. from 415 pounds down to 244 pounds no longer needs cpap  . Peripheral neuropathy   . Phimosis   . Type 2 diabetes mellitus (HCC)    per pt no meds for two years pt was changed to levemir unable to afford but pt states is waiting on approval for a program he applied for that will pay for  his meds [facility])     Social History   Socioeconomic History  . Marital status: Married    Spouse name: Not on file  . Number of children: 2  . Years of education: college   . Highest education level: Not on file  Occupational History  . Occupation: Group Home   Social Needs  . Financial resource strain: Not on file  . Food insecurity:    Worry: Not on file    Inability: Not on file  . Transportation needs:    Medical: Not on file    Non-medical: Not on file  Tobacco Use  . Smoking status: Former Smoker    Years: 2.00    Types: Cigarettes    Last attempt to quit: 02/05/2008    Years since quitting: 9.7  . Smokeless tobacco: Never Used  Substance and Sexual Activity  . Alcohol use: No  . Drug use: No  . Sexual activity: Not on file  Lifestyle  . Physical activity:    Days per week: Not on file    Minutes per session: Not on file  . Stress: Not on file  Relationships  . Social connections:    Talks on phone: Not on file    Gets together: Not on file    Attends religious service: Not on file    Active member of club or organization: Not on file    Attends meetings of clubs or organizations:  Not on file    Relationship status: Not on file  Other Topics Concern  . Not on file  Social History Narrative   Lives with wife and 2 kids, age 44 and 57.    Family History  Problem Relation Age of Onset  . Diabetes Mother   . Diabetes Father   . Diabetes Sister   . Diabetes Brother   . Diabetes Maternal Grandmother   . Diabetes Maternal Grandfather   . Diabetes Paternal Grandmother   . Diabetes Paternal Grandfather    O:    Component Value Date/Time   CHOL 244 (H) 09/08/2017 1155   HDL 30 (L) 09/08/2017 1155   TRIG 296 (H) 09/08/2017 1155   AST 14 (L) 05/08/2017 1711   ALT 16 (L) 05/08/2017 1711   NA 137 05/14/2017 0315   K 3.6 05/14/2017 0315   CL 103 05/14/2017 0315   CO2 24 05/14/2017 0315   GLUCOSE 156 (H) 05/14/2017 0315   HGBA1C >14.0 (A)  09/08/2017 1211   BUN 15 05/14/2017 0315   CREATININE 0.84 05/14/2017 0315   CREATININE 0.91 09/15/2013 1727   CALCIUM 8.1 (L) 05/14/2017 0315   GFRNONAA >60 05/14/2017 0315   GFRAA >60 05/14/2017 0315   WBC 12.2 (H) 05/14/2017 0315   HGB 11.9 (L) 05/14/2017 0315   HCT 35.2 (L) 05/14/2017 0315   PLT 382 05/14/2017 0315   TSH 1.111 09/15/2013 1738   Ht Readings from Last 2 Encounters:  06/04/17 6' (1.829 m)  05/21/17 6' (1.829 m)   Wt Readings from Last 2 Encounters:  06/04/17 244 lb 1.6 oz (110.7 kg)  05/21/17 233 lb 3.2 oz (105.8 kg)   There is no height or weight on file to calculate BMI. BP Readings from Last 3 Encounters:  06/04/17 130/86  05/21/17 126/88  05/14/17 (!) 147/92   A/P:  Patient reports no symptoms of concern today, home BG are still around 200s-300s while taking Xultophy 24 units daily (includes 0.9 mg liraglutide daily). No hypoglycemia reported, no BG > 400.  Stopped Xultophy for now to switch to/titrate liraglutide. Patient education and samples of liraglutide (Victoza) for instructions to titrate to 1.2 mg daily, then 1.8 mg daily after 1 week if tolerated. We may re-initiate insulin or other DM therapy depending on patient response to Victoza.  He is still awaiting [employer]. Education was also provided regarding statin therapy considering CV risk and lipid panel on 09/08/17 (LDL 155), to re-visit once [employer]. He states he did provide all necessary paperwork and anticipating it will take a few more weeks.  An after visit summary was provided and patient advised to follow up in 10 days or sooner if any changes in condition or questions regarding medications arise.   The patient verbalized understanding of information provided by repeating back concepts discussed.

## 2017-11-04 ENCOUNTER — Telehealth: Payer: Self-pay | Admitting: Pharmacist

## 2017-11-15 NOTE — Telephone Encounter (Signed)
42 yo M contacted for diabetes management follow up on 11/15/17.   Patient reports his blood glucose levels are currently uncontrolled with readings between 280-330. Patient reports checking blood glucose levels once daily. Current diabetic regimen includes Victoza (liraglutide) 1.8mg  daily. Patient states he has run out of Victoza today.   Patient has been scheduled with Pharmacy Clinic on 10/22 for additional diabetes medication management.

## 2017-11-16 ENCOUNTER — Ambulatory Visit: Payer: Self-pay | Admitting: Pharmacist

## 2017-11-16 DIAGNOSIS — E1165 Type 2 diabetes mellitus with hyperglycemia: Secondary | ICD-10-CM

## 2017-11-16 NOTE — Progress Notes (Addendum)
S: Shaun Cummings is a 42 y.o. male reports to clinical pharmacist appointment for diabetes management. Patient did not bring medication bottles.   Patient states that he has been experiencing increased nocturia and blurred vision that he notices is associated with his elevated blood glucose levels. Patient states he currently is taking Victoza (liraglutide) 1.53m daily, however has been out of his Victoza since Saturday.   Patient also reports he has not received his [employer], but has submitted all the paperwork needed.   Allergies  Allergen Reactions  . Augmentin [Amoxicillin-Pot Clavulanate] Hives and Itching    Has patient had a PCN reaction causing immediate rash, facial/tongue/throat swelling, SOB or lightheadedness with hypotension: Yes Has patient had a PCN reaction causing severe rash involving mucus membranes or skin necrosis: No Has patient had a PCN reaction that required hospitalization: No Has patient had a PCN reaction occurring within the last 10 years: No If all of the above answers are "NO", then may proceed with Cephalosporin use.   Medication Sig  acetaminophen (TYLENOL) 325 MG tablet Take 325 mg by mouth every 6 (six) hours as needed for headache (pain).  blood glucose meter kit and supplies Dispense based on patient and insurance preference. Use up to four times daily as directed. (FOR ICD-10 E10.9, E11.9).  gabapentin (NEURONTIN) 300 MG capsule Take 1 capsule (300 mg total) by mouth 3 (three) times daily.  glucose blood (CONTOUR NEXT TEST) test strip Test blood glucose up to 4 times daily  Insulin Syringe-Needle U-100 (INSULIN SYRINGE .3CC/31GX5/16") 31G X 5/16" 0.3 ML MISC 25 Units by Does not apply route 2 (two) times daily.  meloxicam (MOBIC) 7.5 MG tablet Take 2 tablets (15 mg total) by mouth daily.  oxyCODONE-acetaminophen (PERCOCET) 5-325 MG tablet Take 1 tablet by mouth 2 (two) times daily as needed for severe pain.   Past Medical History:  Diagnosis Date   . Hyperlipidemia   . Lesion of penis    foreskin  . OSA (obstructive sleep apnea)    per pt dx osa and used cpap but after losing wt. from 415 pounds down to 244 pounds no longer needs cpap  . Peripheral neuropathy   . Phimosis   . Type 2 diabetes mellitus (HCC)    per pt no meds for two years pt was changed to levemir unable to afford but pt states is waiting on approval for a program he applied for that will pay for his meds ([facility]     Social History   Socioeconomic History  . Marital status: Married    Spouse name: Not on file  . Number of children: 2  . Years of education: college   . Highest education level: Not on file  Occupational History  . Occupation: Group Home   Social Needs  . Financial resource strain: Not on file  . Food insecurity:    Worry: Not on file    Inability: Not on file  . Transportation needs:    Medical: Not on file    Non-medical: Not on file  Tobacco Use  . Smoking status: Former Smoker    Years: 2.00    Types: Cigarettes    Last attempt to quit: 02/05/2008    Years since quitting: 9.7  . Smokeless tobacco: Never Used  Substance and Sexual Activity  . Alcohol use: No  . Drug use: No  . Sexual activity: Not on file  Lifestyle  . Physical activity:    Days per week: Not  on file    Minutes per session: Not on file  . Stress: Not on file  Relationships  . Social connections:    Talks on phone: Not on file    Gets together: Not on file    Attends religious service: Not on file    Active member of club or organization: Not on file    Attends meetings of clubs or organizations: Not on file    Relationship status: Not on file  Other Topics Concern  . Not on file  Social History Narrative   Lives with wife and 2 kids, age 66 and 47.    Family History  Problem Relation Age of Onset  . Diabetes Mother   . Diabetes Father   . Diabetes Sister   . Diabetes Brother   . Diabetes Maternal Grandmother   . Diabetes Maternal  Grandfather   . Diabetes Paternal Grandmother   . Diabetes Paternal Grandfather    O:    Component Value Date/Time   CHOL 244 (H) 09/08/2017 1155   HDL 30 (L) 09/08/2017 1155   TRIG 296 (H) 09/08/2017 1155   AST 14 (L) 05/08/2017 1711   ALT 16 (L) 05/08/2017 1711   NA 137 05/14/2017 0315   K 3.6 05/14/2017 0315   CL 103 05/14/2017 0315   CO2 24 05/14/2017 0315   GLUCOSE 156 (H) 05/14/2017 0315   HGBA1C >14.0 (A) 09/08/2017 1211   BUN 15 05/14/2017 0315   CREATININE 0.84 05/14/2017 0315   CREATININE 0.91 09/15/2013 1727   CALCIUM 8.1 (L) 05/14/2017 0315   GFRNONAA >60 05/14/2017 0315   GFRAA >60 05/14/2017 0315   WBC 12.2 (H) 05/14/2017 0315   HGB 11.9 (L) 05/14/2017 0315   HCT 35.2 (L) 05/14/2017 0315   PLT 382 05/14/2017 0315   TSH 1.111 09/15/2013 1738   Ht Readings from Last 2 Encounters:  06/04/17 6' (1.829 m)  05/21/17 6' (1.829 m)   Wt Readings from Last 2 Encounters:  06/04/17 244 lb 1.6 oz (110.7 kg)  05/21/17 233 lb 3.2 oz (105.8 kg)   There is no height or weight on file to calculate BMI. BP Readings from Last 3 Encounters:  06/04/17 130/86  05/21/17 126/88  05/14/17 (!) 147/92   SMBG once daily: patient reports readings are in the upper 200s to mid 300s with nothing below  280.    Assessment: Patient currently has uncontrolled diabetes with last A1c at >14 and blood glucose levels in the 300s. Patient is experiencing symptoms of hyperglycemia including nocturia and blurred vision. He is usually compliant with medications, however, he has run out of Victoza a few days ago and has been unable to purchase a refill due to lack of insurance and is currently awaiting [employer].   Plan:   Start Tresiba (insulin degludec) 15 units once daily. Provided patient with sample of Tresiba vial with needles. Patient confirms understanding use of vials as he has used vials when he was last on insulin.   Stop Victoza and start Ozempic 0.54m once weekly. Will  plan to titrate patient up to 0.533min 4 weeks. Educated patient on possible side effects and benefits of medication.   Educated patient on signs/symptoms of hypoglycemia and proper steps to take when blood glucose levels are low. Advised patient to contact Pharmacy clinic if blood glucose levels are below 80.  Increase blood glucose monitoring to 2 times daily.   [place] paperwork is in process for patient's  [employer]. Follow up on [place] approval.   Medications were reviewed with the patient, including name, instructions, indication, goals of therapy, potential side effects, importance of adherence, and safe use.  Pharmacy Clinic will follow up with patient in one week via phone call.  Follow up with Pharmacy Clinic on 4 weeks.   Patient communicates understanding with the above plan.    An after visit summary was provided and patient advised to follow up in 4 weeks or sooner if any changes in condition or questions regarding medications arise.   The patient verbalized understanding of information provided by repeating back concepts discussed.   40 minutes spent face-to-face with the patient during the encounter. 50% of time spent on education. 50% of time was spent on patient medication assistance program.  Gwenlyn Found, Sherian Rein D PGY1 Pharmacy Resident  Phone [phone] 11/16/2017   3:34 PM  Patient was seen in clinic with Gwenlyn Found, PharmD, PGY1 pharmacy resident. I agree with the assessment and plan of care documented.

## 2017-11-16 NOTE — Patient Instructions (Addendum)
1. Start Tresiba 15 units daily  2. STOP Victoza and START Ozempic 0.25mg  once weekly 3. Call Pharmacy Clinic if you start seeing blood sugars below 80 4. Follow up with Pharmacy Clinic in one month

## 2017-11-17 NOTE — Addendum Note (Signed)
Addended by: Arby Barrette on: 11/17/2017 08:35 AM   Modules accepted: Level of Service

## 2017-11-25 ENCOUNTER — Telehealth: Payer: Self-pay | Admitting: Internal Medicine

## 2017-12-01 ENCOUNTER — Telehealth: Payer: Self-pay | Admitting: Pharmacist

## 2017-12-01 NOTE — Progress Notes (Addendum)
Patient follow up help with BG medications. Patient's A1C has been > 13 since 2015 (possibly longer). He states his BG are improved compared to last week, from the 400s to the 300s. He is currently taking 0.5 mg weekly of semaglutide and 22 units daily of Tresiba. Advised patient to increase Tresiba to 25 units, will continue to follow up with patient while he establishes with PCP (awaiting his [employer], which he was just approved for).

## 2017-12-21 ENCOUNTER — Telehealth: Payer: Self-pay | Admitting: Pharmacist

## 2017-12-21 NOTE — Progress Notes (Signed)
Contacted patient to follow up regarding DM med management support while he is establishing care. Spoke to [place], Development worker, community, who states patient was approved for [employer] and should have or will receive in the mail. He does have a PCP appointment scheduled for 01/05/18.  He reports home BG in the 200s-300s, no symptoms of hyper- or hypoglycemia reported. He does report GI side effects since increasing semaglutide to 0.5 mg weekly (reflux, nausea, bloating), patient is interested in reducing dose back to 0.25 mg weekly which was previously tolerated. Advised patient to reduce semaglutide to 0.25 mg weekly, increase insulin degludec Tyler Aas) from 25 units to 30 units daily. Advised patient to contact clinic if questions or concerns arise leading up to this appointment. He verbalized understanding. He states he will run out of these medications in the next few days. Clinic will be closed for holiday, so advised patient to report to clinic tomorrow for samples to minimize DKA risk while establishing care.   For ongoing medication access in the future, we can refer patient to [facility] Department to enroll into free patient assistance programs for [place] and Ozempic. Of note, patient's A1C has been around 13 since at least 2015, so patient engagement/adherence may be challenging. Will continue to assist in his care as needed.

## 2017-12-22 NOTE — Telephone Encounter (Signed)
Pt presented to office to pick up medication samples.  Samples were left in sample bag in medication refrigerator by Dr. Maudie Mercury.  Samples pulled and Dr. Annie Paras Handed pt samples which were provided per Dr. Maudie Mercury. SChaplin, RN,BSN

## 2017-12-22 NOTE — Telephone Encounter (Signed)
Ok. Will be ready for him.

## 2018-01-05 ENCOUNTER — Encounter: Payer: Self-pay | Admitting: Internal Medicine

## 2018-01-21 ENCOUNTER — Other Ambulatory Visit: Payer: Self-pay

## 2018-01-21 MED ORDER — INSULIN DEGLUDEC 100 UNIT/ML ~~LOC~~ SOPN: 30.0000 [IU] | PEN_INJECTOR | Freq: Every day | SUBCUTANEOUS | 1 refills | Status: DC

## 2018-01-21 MED ORDER — SEMAGLUTIDE(0.25 OR 0.5MG/DOS) 2 MG/1.5ML ~~LOC~~ SOPN: 0.5000 mg | PEN_INJECTOR | SUBCUTANEOUS | 1 refills | Status: DC

## 2018-01-21 NOTE — Telephone Encounter (Signed)
What is his insurance status, I would think these two medications would be prohibitively expensive if he indeed has no coverage?

## 2018-01-21 NOTE — Telephone Encounter (Signed)
We do not have samples of these medicines. They may come in next week.

## 2018-01-21 NOTE — Telephone Encounter (Signed)
insulin degludec (TRESIBA FLEXTOUCH) 100 UNIT/ML SOPN FlexTouch Pen,     Semaglutide,0.25 or 0.5MG /DOS, (OZEMPIC, 0.25 OR 0.5 MG/DOSE,) 2 MG/1.5ML SOPN   Would like samples for these meds. Please call pt back.

## 2018-01-21 NOTE — Telephone Encounter (Signed)
Pt has an active orange card, states he has medicine to last til Monday, have given him ph# to [place] and he states he will call now, I spoke w/ dawn and she will assist pt

## 2018-03-08 NOTE — Progress Notes (Signed)
   CC: diabetes and blood sugar concerns  HPI:[name] is a 43 y.o. male who presents for evaluation of diabetes. Please see individual problem based A/P for details.  Influenza vaccination: declined  Depression, PHQ-9: Based on the patients  score of 3 we have decided to monitor.  Past Medical History:  Diagnosis Date  . Hyperlipidemia   . Lesion of penis    foreskin  . OSA (obstructive sleep apnea)    per pt dx osa and used cpap but after losing wt. from 415 pounds down to 244 pounds no longer needs cpap  . Peripheral neuropathy   . Phimosis   . Scrotal abscess 05/08/2017  . Type 2 diabetes mellitus (HCC)    per pt no meds for two years pt was changed to levemir unable to afford but pt states is waiting on approval for a program he applied for that will pay for his meds [facility])     Review of Systems:  ROS negative except as per HPI.  The patient lives at home with his daughter. He denied recent tobacco use.  Physical Exam: Vitals:   03/09/18 1605  BP: (!) 156/102  Pulse: (!) 102  Temp: 98.1 F (36.7 C)  TempSrc: Oral  SpO2: 99%  Weight: 258 lb 11.2 oz (117.3 kg)   General: A/O x4, in no acute distress, afebrile, nondiaphoretic HEENT: PEERL, EMO intact Cardio: RRR, no mrg's Pulmonary: CTA bilaterally MSK: BLE nontender, nonedematous Neuro: conversational, normal gait Psych: Appropriate affect, not depressed in appearance, engages well  Assessment & Plan:   See Encounters Tab for problem based charting.  Patient discussed with Dr. Eppie Gibson

## 2018-03-09 ENCOUNTER — Ambulatory Visit: Payer: Self-pay | Admitting: Internal Medicine

## 2018-03-09 ENCOUNTER — Encounter: Payer: Self-pay | Admitting: Internal Medicine

## 2018-03-09 ENCOUNTER — Other Ambulatory Visit: Payer: Self-pay

## 2018-03-09 VITALS — BP 156/102 | HR 102 | Temp 98.1°F | Wt 258.7 lb

## 2018-03-09 DIAGNOSIS — E1165 Type 2 diabetes mellitus with hyperglycemia: Secondary | ICD-10-CM

## 2018-03-09 DIAGNOSIS — E1142 Type 2 diabetes mellitus with diabetic polyneuropathy: Secondary | ICD-10-CM

## 2018-03-09 DIAGNOSIS — Z79899 Other long term (current) drug therapy: Secondary | ICD-10-CM

## 2018-03-09 DIAGNOSIS — I10 Essential (primary) hypertension: Secondary | ICD-10-CM

## 2018-03-09 DIAGNOSIS — Z794 Long term (current) use of insulin: Secondary | ICD-10-CM

## 2018-03-09 LAB — POCT GLYCOSYLATED HEMOGLOBIN (HGB A1C): HbA1c POC (<> result, manual entry): >14 % (ref 4.0–5.6)

## 2018-03-09 LAB — GLUCOSE, CAPILLARY: GLUCOSE-CAPILLARY: 512 mg/dL — AB (ref 70–99)

## 2018-03-09 MED ORDER — GABAPENTIN 300 MG PO CAPS: 300.0000 mg | ORAL_CAPSULE | Freq: Every evening | ORAL | 0 refills | Status: DC | PRN

## 2018-03-09 MED ORDER — INSULIN GLARGINE 100 UNIT/ML SOLOSTAR PEN: 30.0000 [IU] | PEN_INJECTOR | Freq: Every day | SUBCUTANEOUS | 7 refills | Status: DC

## 2018-03-09 MED ORDER — GLUCOSE BLOOD VI STRP: ORAL_STRIP | 1 refills | Status: DC

## 2018-03-09 MED ORDER — CONTOUR NEXT MONITOR W/DEVICE KIT: 30.0000 [IU] | PACK | Freq: Every day | 0 refills | Status: DC

## 2018-03-09 MED ORDER — METFORMIN HCL ER 500 MG PO TB24: 500.0000 mg | ORAL_TABLET | Freq: Every day | ORAL | 1 refills | Status: DC

## 2018-03-09 MED FILL — GABAPENTIN 300 MG CAPSULE: 300 | 30 days supply | Qty: 30 | Fill #0

## 2018-03-09 MED FILL — metFORMIN HCL ER 500 MG TB2: 500 | 30 days supply | Qty: 30 | Fill #0

## 2018-03-09 MED FILL — LANTUS SOLOSTAR 100 UNITS/M: 100 | 30 days supply | Qty: 9 | Fill #0

## 2018-03-09 NOTE — Patient Instructions (Signed)
FOLLOW-UP INSTRUCTIONS When: 4-6 wks  For: Routine visit with me What to bring: All of your medications  I have restarted your lantus insulin at 30U nightly and Metformin with breakfast daily at a low dose. Please be certain to return to see me in 4-6 weeks or someone else in that time frame if I am not available. I have also refilled your script for testing strips. Please take at least a morning and evening test at the minimum so that I may know how to better treat you. Without this data I am uncertain I will be able to help as well.  Thank you for your visit to the [name] today. If you have any questions or concerns please call us at [phone].

## 2018-03-09 NOTE — Assessment & Plan Note (Signed)
Hypertension: Patient's BP today is 156/102 with a goal of <140/80. Weight is up to 258 from 239 one year prior. Given the issues with his diabetes I would address his HTN at his next visit once his ability to obtain medication has been arranged.

## 2018-03-09 NOTE — Assessment & Plan Note (Addendum)
DMII: Last A1c >14% on 08/14/2019and >14% again today. He has undergone significant medication and therapy counseling with pharmacy. Today he endorses being off of his medications since ~mid December of 2019. He stated that he had issues with the orange card paperwork and obtaining the health departments assistance with medications. He ran short of his sample medications and as such has not been on treatment since. Today his glucose is 512, although he is asymptomatic. I have reinforced the idea that he will need to more closely follow-up his health and make certain that his paperwork is completed on time so that he is able to acquire the medications from the appropriate resource. I do not see why he could not have his medications provided given his income status. He will simply need to apply for the programs that we have recommended and more diligently pursue improving his health care.  He denied nausea, vomiting, diarrhea, abdominal pain, polyuria, polydipsia, headache, confusion, diaphoresis or chills.   Plan: I have sent a script to the [facility] for lantus Insulin 30U daily as his [employer] card is expiring.  I have asked him to retry Metformin 500mg  XR, he stated that this may cause him side effects, if so, discontinue and initiate alternative therapy. Repeat A1c in 8-12 weeks.  He is to return in 4-6 weeks with glucometer data and possible medication changes such as the addition of an alternative or additional oral agent preferably. Would avoid an SGLT-2i. Suggest GLP-1  Refilled gabapentin for QHS prn for peripheral neuropathy

## 2018-03-10 NOTE — Addendum Note (Signed)
Addended by: Oval Linsey D on: 03/10/2018 09:03 AM   Modules accepted: Level of Service

## 2018-03-10 NOTE — Progress Notes (Signed)
Patient ID: Shaun Cummings, male   DOB: 04-27-1975, 43 y.o.   MRN: [id number]  Case discussed with Dr. Berline Lopes at the time of the visit. We reviewed the resident's history and exam and pertinent patient test results. I agree with the assessment, diagnosis, and plan of care documented in the resident's note.

## 2018-03-14 ENCOUNTER — Encounter: Payer: Self-pay | Admitting: Internal Medicine

## 2018-03-14 ENCOUNTER — Other Ambulatory Visit: Payer: Self-pay

## 2018-03-14 ENCOUNTER — Ambulatory Visit: Payer: Self-pay | Admitting: Internal Medicine

## 2018-03-14 VITALS — BP 151/85 | HR 113 | Temp 100.7°F | Ht 72.0 in | Wt 257.7 lb

## 2018-03-14 DIAGNOSIS — L089 Local infection of the skin and subcutaneous tissue, unspecified: Secondary | ICD-10-CM

## 2018-03-14 DIAGNOSIS — E118 Type 2 diabetes mellitus with unspecified complications: Secondary | ICD-10-CM

## 2018-03-14 DIAGNOSIS — L720 Epidermal cyst: Secondary | ICD-10-CM

## 2018-03-14 DIAGNOSIS — Z794 Long term (current) use of insulin: Secondary | ICD-10-CM

## 2018-03-14 HISTORY — DX: Local infection of the skin and subcutaneous tissue, unspecified: L08.9

## 2018-03-14 MED ORDER — DOXYCYCLINE HYCLATE 100 MG PO TABS: 100.0000 mg | ORAL_TABLET | Freq: Two times a day (BID) | ORAL | 0 refills | Status: DC

## 2018-03-14 MED ORDER — HYDROCODONE-ACETAMINOPHEN 5-325 MG PO TABS: 1.0000 | ORAL_TABLET | Freq: Three times a day (TID) | ORAL | 0 refills | Status: AC | PRN

## 2018-03-14 MED FILL — CONTOUR NEXT METER: W/DEVICE | 30 days supply | Qty: 1 | Fill #0

## 2018-03-14 MED FILL — HYDROCODON-APAP 5-325: 5-325 | 5 days supply | Qty: 15 | Fill #0

## 2018-03-14 MED FILL — DOXYCYCLINE HYCLATE 100 MG: 100 | 7 days supply | Qty: 14 | Fill #0

## 2018-03-14 MED FILL — CONTOUR NEXT STRIPS: 25 days supply | Qty: 50 | Fill #0

## 2018-03-14 NOTE — Assessment & Plan Note (Addendum)
Shaun Cummings presents with a 1 week history of a painful, growing cyst in the back of the head. He thought this was a boil, which he has had in the past in the same location, and has been trying warm compresses at home with no relief in symptoms. The lesion has grown significantly in the past 4 days and he is having difficulty sleeping and moving his neck due to pain. He also developed a new fever today and has noted his blood glucose has been higher than usual (CBG in the 400-500s). On exam he is febrile and has a 8x10cm cystic lesion in the posterior aspect of the head that is not associated with erythema or warmth. He is tender to palpation over the affected area. We discussed this is likely an infected epithelial inclusion cyst that will need incision and drainage by a surgeon due to immunosuppressed status (uncontrolled insulin-dependent T2DM) and location of the lesion. We offered direct admission to the hospital for IV antibiotics and surgery consultation but patient declined as he is the sole provider at home and is uninsured. We therefore recommended outpatient antibiotics, pain control, and urgent referral to general surgery for I&D. Sent prescription for doxycycline 100 mg BID x 7 days and Norco 5-325 q8h PRN (#15 tablets, no RF). We also advised him to increase his Lantus dose to 40 units in the meantime. Return precautions discussed. One potential issue with this plan is that his [employer] Card appears to have expired 3 days ago, though he states this is a mistake. Will discuss with Ms. Collier Salina tomorrow. If expired, will call patient and advised him to go to the ED otherwise he would have to pay out of pocket to see surgery.

## 2018-03-14 NOTE — Progress Notes (Signed)
   CC: Cyst in head   HPI:  [name] is a 43 y.o. year-old male with PMH listed below who presents to clinic for evaluation of a cyst in head. Please see problem based assessment and plan for further details.   Past Medical History:  Diagnosis Date  . Hyperlipidemia   . Lesion of penis    foreskin  . OSA (obstructive sleep apnea)    per pt dx osa and used cpap but after losing wt. from 415 pounds down to 244 pounds no longer needs cpap  . Peripheral neuropathy   . Phimosis   . Scrotal abscess 05/08/2017  . Type 2 diabetes mellitus (HCC)    per pt no meds for two years pt was changed to levemir unable to afford but pt states is waiting on approval for a program he applied for that will pay for his meds [facility])     Review of Systems:   Review of Systems  Constitutional: Positive for chills and fever.  Musculoskeletal: Positive for neck pain.  Neurological: Negative for headaches.    Physical Exam: Vitals:   03/14/18 1546  BP: (!) 151/85  Pulse: (!) 113  Temp: (!) 100.7 F (38.2 C)  TempSrc: Oral  SpO2: 96%  Weight: 257 lb 11.2 oz (116.9 kg)  Height: 6' (1.829 m)   General: well-appearing male, in pain but in no acute distress  Skin: There is a 8x10cm cystic lesion in the posterior aspect of the head with a white head in the center. Feels hard to the touch and is tender to palpation. No warmth or erythema.    Assessment & Plan:   See Encounters Tab for problem based charting.  Patient seen with Dr. Evette Doffing

## 2018-03-14 NOTE — Patient Instructions (Signed)
Mr. Broyhill,   I sent a prescription for the antibiotic doxycycline to your pharmacy.  Please take 1 tablet twice a day for the next week.  I also sent a prescription for the pain medication Norco to use every 8 hours as needed for pain.  I also placed an urgent referral to the general surgeon.  You should expect a call from them tomorrow to schedule this appointment.  Call us if you have any questions or concerns.  -Dr. Frederico Hamman

## 2018-03-15 ENCOUNTER — Telehealth: Payer: Self-pay | Admitting: *Deleted

## 2018-03-15 NOTE — Progress Notes (Signed)
Internal Medicine Clinic Attending  I saw and evaluated the patient.  I personally confirmed the key portions of the history and exam documented by Dr. Isac Sarna and I reviewed pertinent patient test results.  The assessment, diagnosis, and plan were formulated together and I agree with the documentation in the resident's note.  43 year old person living with insulin-dependent diabetes here with a large walled-off fluid collection at the posterior base of his scalp. It measures about 8x10cm, there is tenderness, no warmth or redness of the skin, one small papule at the center but no drainage, skin is thick and the fluid collection feels about 1cm deep. This has been progressive for one week, developed a fever today. I would favor an infected inclusion cyst over an abscess based on my exam. Size, location, depth, and his diabetes all make this too complicated and high risk for me to perform I&D in our clinic. We talked about ways to refer him for surgical consultation, but he has been stung by surprise hospital billing in the past and is afraid of more expensive interventions. For now we will treat supportively with antibiotics, pain control, and increase insulin for his hyperglycemia. Refer to surgery, return precautions if worsening symptoms.

## 2018-03-16 ENCOUNTER — Ambulatory Visit: Payer: Self-pay

## 2018-03-17 ENCOUNTER — Emergency Department (HOSPITAL_COMMUNITY)
Admission: EM | Admit: 2018-03-17 | Discharge: 2018-03-17 | Disposition: A | Discharge status: Home / Self Care | Payer: Self-pay | Attending: Emergency Medicine | Admitting: Emergency Medicine

## 2018-03-17 ENCOUNTER — Encounter (HOSPITAL_COMMUNITY): Payer: Self-pay | Admitting: Emergency Medicine

## 2018-03-17 DIAGNOSIS — L02811 Cutaneous abscess of head [any part, except face]: Secondary | ICD-10-CM | POA: Insufficient documentation

## 2018-03-17 DIAGNOSIS — L03811 Cellulitis of head [any part, except face]: Secondary | ICD-10-CM

## 2018-03-17 DIAGNOSIS — Z87891 Personal history of nicotine dependence: Secondary | ICD-10-CM | POA: Insufficient documentation

## 2018-03-17 DIAGNOSIS — I10 Essential (primary) hypertension: Secondary | ICD-10-CM | POA: Insufficient documentation

## 2018-03-17 DIAGNOSIS — E119 Type 2 diabetes mellitus without complications: Secondary | ICD-10-CM | POA: Insufficient documentation

## 2018-03-17 MED ORDER — METHOCARBAMOL 500 MG PO TABS: 500.0000 mg | ORAL_TABLET | Freq: Two times a day (BID) | ORAL | 0 refills | Status: DC

## 2018-03-17 MED FILL — METHOCARBAMOL 500 MG TABS: 500 | 10 days supply | Qty: 20 | Fill #0

## 2018-03-17 NOTE — Discharge Instructions (Addendum)
Please read attached information. If you experience any new or worsening signs or symptoms please return to the emergency room for evaluation. Please follow-up with your primary care provider or specialist as discussed. Please use medication prescribed only as directed and discontinue taking if you have any concerning signs or symptoms.   °

## 2018-03-17 NOTE — ED Triage Notes (Signed)
Pt with cyst like abscess to the back of his head that started out like a tiny pimple and now is red and swollen. He denies f/n/v. A/o NAD.

## 2018-03-17 NOTE — ED Provider Notes (Signed)
[facility] EMERGENCY DEPARTMENT Provider Note   CSN: [id number] Arrival date & time: 03/17/18  0941    History   Chief Complaint Chief Complaint  Patient presents with  . Abscess    HPI Shaun Cummings is a 43 y.o. male.     HPI   43 year old male presents today with complaints of abscess.  Patient notes symptoms started to me 1 week ago with swelling and pain to the posterior aspect of his scalp.  Patient notes is similar to previous abscesses but less painful.  Patient notes he followed up with his primary care 3 days ago was prescribed doxycycline and has been taking it.  Patient notes that the symptoms have not dramatically worsened but are not improving.  He denies any fever.  No history of MRSA.  He notes he has been trying to express pus and has had a small amount of purulence.   Past Medical History:  Diagnosis Date  . Hyperlipidemia   . Lesion of penis    foreskin  . OSA (obstructive sleep apnea)    per pt dx osa and used cpap but after losing wt. from 415 pounds down to 244 pounds no longer needs cpap  . Peripheral neuropathy   . Phimosis   . Scrotal abscess 05/08/2017  . Type 2 diabetes mellitus (HCC)    per pt no meds for two years pt was changed to levemir unable to afford but pt states is waiting on approval for a program he applied for that will pay for his meds [facility])      Patient Active Problem List   Diagnosis Date Noted  . Infected epithelial inclusion cyst 03/14/2018  . Hypertension 03/09/2018  . Peripheral neuropathy 05/21/2017  . Hyperlipidemia associated with type 2 diabetes mellitus ([place]) 09/18/2013  . Obesity (BMI 30-39.9) 09/18/2013  . Diabetes ([place]) 09/15/2013    Past Surgical History:  Procedure Laterality Date  . CIRCUMCISION N/A 02/10/2016   Procedure: CIRCUMCISION ADULT;  Surgeon: Cleon Gustin, MD;  Location: [facility];  Service: Urology;  Laterality: N/A;  . INCISION AND DRAINAGE  ABSCESS Right 05/12/2017   Procedure: INCISION AND DRAINAGE RIGHT INGUINAL ABSCESS;  Surgeon: Erroll Luna, MD;  Location: [facility];  Service: General;  Laterality: Right;  . INCISION AND DRAINAGE PERIRECTAL ABSCESS Right 05/09/2017   Procedure: IRRIGATION AND DEBRIDEMENT PERINEAL ABSCESS;  Surgeon: Donnie Mesa, MD;  Location: [facility];  Service: General;  Laterality: Right;  . NO PAST SURGERIES        Home Medications    Prior to Admission medications   Medication Sig Start Date End Date Taking? Authorizing Provider  Blood Glucose Monitoring Suppl (CONTOUR NEXT MONITOR) w/Device KIT 30 Units by Does not apply route at bedtime. 03/09/18   Kathi Ludwig, MD  doxycycline (VIBRA-TABS) 100 MG tablet Take 1 tablet (100 mg total) by mouth 2 (two) times daily. 03/14/18   Santos-Sanchez, Merlene Morse, MD  gabapentin (NEURONTIN) 300 MG capsule Take 1 capsule (300 mg total) by mouth at bedtime as needed. 03/09/18   Kathi Ludwig, MD  glucose blood (CONTOUR NEXT TEST) test strip Use as instructed 03/09/18   Kathi Ludwig, MD  HYDROcodone-acetaminophen (NORCO) 5-325 MG tablet Take 1 tablet by mouth every 8 (eight) hours as needed for up to 5 days for moderate pain. 03/14/18 03/19/18  Welford Roche, MD  Insulin Glargine (LANTUS SOLOSTAR) 100 UNIT/ML Solostar Pen Inject 30 Units into the skin daily at 10 pm. 03/09/18  Kathi Ludwig, MD  metFORMIN (GLUCOPHAGE-XR) 500 MG 24 hr tablet Take 1 tablet (500 mg total) by mouth daily with breakfast. 03/09/18   Kathi Ludwig, MD  methocarbamol (ROBAXIN) 500 MG tablet Take 1 tablet (500 mg total) by mouth 2 (two) times daily. 03/17/18   Okey Regal, PA-C    Family History Family History  Problem Relation Age of Onset  . Diabetes Mother   . Diabetes Father   . Diabetes Sister   . Diabetes Brother   . Diabetes Maternal Grandmother   . Diabetes Maternal Grandfather   . Diabetes Paternal Grandmother   . Diabetes Paternal Grandfather      Social History Social History   Tobacco Use  . Smoking status: Former Smoker    Years: 2.00    Types: Cigarettes    Last attempt to quit: 02/05/2008    Years since quitting: 10.1  . Smokeless tobacco: Never Used  Substance Use Topics  . Alcohol use: No  . Drug use: No     Allergies   Augmentin [amoxicillin-pot clavulanate]   Review of Systems Review of Systems  All other systems reviewed and are negative.   Physical Exam Updated Vital Signs BP (!) 124/96 (BP Location: Right Arm)   Pulse 95   Temp 98.4 F (36.9 C) (Oral)   Resp 20   SpO2 97%   Physical Exam Vitals signs and nursing note reviewed.  Constitutional:      Appearance: He is well-developed.  HENT:     Head: Normocephalic and atraumatic.  Eyes:     General: No scleral icterus.       Right eye: No discharge.        Left eye: No discharge.     Conjunctiva/sclera: Conjunctivae normal.     Pupils: Pupils are equal, round, and reactive to light.  Neck:     Musculoskeletal: Normal range of motion.     Vascular: No JVD.     Trachea: No tracheal deviation.  Pulmonary:     Effort: Pulmonary effort is normal.     Breath sounds: No stridor.  Skin:    Comments: Large area of induration to the posterior scalp no central fluctuance, small pustule noted, very minimal central erythema, no surrounding erythema  Neurological:     Mental Status: He is alert and oriented to person, place, and time.     Coordination: Coordination normal.  Psychiatric:        Behavior: Behavior normal.        Thought Content: Thought content normal.        Judgment: Judgment normal.     ED Treatments / Results  Labs (all labs ordered are listed, but only abnormal results are displayed) Labs Reviewed - No data to display  EKG None  Radiology No results found.  Procedures Procedures (including critical care time)  Medications Ordered in ED Medications - No data to display   Initial Impression / Assessment and  Plan / ED Course  I have reviewed the triage vital signs and the nursing notes.  Pertinent labs & imaging results that were available during my care of the patient were reviewed by me and considered in my medical decision making (see chart for details).        Labs:   Imaging:  Consults:  Therapeutics:  Discharge Meds:   Assessment/Plan: 43 year old male presents today with cellulitis to his posterior scalp.  He has no drainable abscess aced on physical exam and bedside ultrasound.  Patient has  been on doxycycline for 3 days I do feel it reasonable to continue the doxycycline, follow-up on Monday if symptoms have not improved or immediately if they worsen.  Patient verbalized understanding and agreement to today's plan had no further questions or concerns.  Patient also having tense muscles in his trapezius secondary to position changes he cannot sleep laying on his back due to the swelling.   Final Clinical Impressions(s) / ED Diagnoses   Final diagnoses:  Cellulitis of head except face    ED Discharge Orders         Ordered    methocarbamol (ROBAXIN) 500 MG tablet  2 times daily     03/17/18 1118           Okey Regal, PA-C 03/17/18 1125    Veryl Speak, MD 03/17/18 1504

## 2018-03-20 ENCOUNTER — Encounter (HOSPITAL_COMMUNITY): Payer: Self-pay | Admitting: *Deleted

## 2018-03-20 ENCOUNTER — Other Ambulatory Visit: Payer: Self-pay

## 2018-03-20 ENCOUNTER — Inpatient Hospital Stay (HOSPITAL_COMMUNITY)
Admission: EM | Admit: 2018-03-20 | Discharge: 2018-03-25 | DRG: 571 | Disposition: A | Discharge status: Home / Self Care | Payer: Self-pay | Attending: Internal Medicine | Admitting: Internal Medicine

## 2018-03-20 ENCOUNTER — Emergency Department (HOSPITAL_COMMUNITY): Payer: Self-pay

## 2018-03-20 DIAGNOSIS — Z87891 Personal history of nicotine dependence: Secondary | ICD-10-CM

## 2018-03-20 DIAGNOSIS — L0390 Cellulitis, unspecified: Secondary | ICD-10-CM | POA: Diagnosis present

## 2018-03-20 DIAGNOSIS — Z833 Family history of diabetes mellitus: Secondary | ICD-10-CM

## 2018-03-20 DIAGNOSIS — L0211 Cutaneous abscess of neck: Secondary | ICD-10-CM | POA: Diagnosis present

## 2018-03-20 DIAGNOSIS — G4733 Obstructive sleep apnea (adult) (pediatric): Secondary | ICD-10-CM | POA: Diagnosis present

## 2018-03-20 DIAGNOSIS — T383X6A Underdosing of insulin and oral hypoglycemic [antidiabetic] drugs, initial encounter: Secondary | ICD-10-CM | POA: Diagnosis present

## 2018-03-20 DIAGNOSIS — Z881 Allergy status to other antibiotic agents status: Secondary | ICD-10-CM

## 2018-03-20 DIAGNOSIS — Z91041 Radiographic dye allergy status: Secondary | ICD-10-CM

## 2018-03-20 DIAGNOSIS — I10 Essential (primary) hypertension: Secondary | ICD-10-CM | POA: Diagnosis present

## 2018-03-20 DIAGNOSIS — E785 Hyperlipidemia, unspecified: Secondary | ICD-10-CM | POA: Diagnosis present

## 2018-03-20 DIAGNOSIS — L03811 Cellulitis of head [any part, except face]: Secondary | ICD-10-CM | POA: Diagnosis present

## 2018-03-20 DIAGNOSIS — Z794 Long term (current) use of insulin: Secondary | ICD-10-CM

## 2018-03-20 DIAGNOSIS — Z6833 Body mass index (BMI) 33.0-33.9, adult: Secondary | ICD-10-CM

## 2018-03-20 DIAGNOSIS — E1165 Type 2 diabetes mellitus with hyperglycemia: Secondary | ICD-10-CM | POA: Diagnosis present

## 2018-03-20 DIAGNOSIS — Z79899 Other long term (current) drug therapy: Secondary | ICD-10-CM

## 2018-03-20 DIAGNOSIS — E440 Moderate protein-calorie malnutrition: Secondary | ICD-10-CM | POA: Diagnosis present

## 2018-03-20 DIAGNOSIS — L03221 Cellulitis of neck: Principal | ICD-10-CM | POA: Diagnosis present

## 2018-03-20 DIAGNOSIS — Z880 Allergy status to penicillin: Secondary | ICD-10-CM

## 2018-03-20 DIAGNOSIS — E119 Type 2 diabetes mellitus without complications: Secondary | ICD-10-CM

## 2018-03-20 LAB — CBC WITH DIFFERENTIAL/PLATELET
Abs Immature Granulocytes: 0.31 10*3/uL — ABNORMAL HIGH (ref 0.00–0.07)
Basophils Absolute: 0.1 10*3/uL (ref 0.0–0.1)
Basophils Relative: 1 %
Eosinophils Absolute: 0.4 10*3/uL (ref 0.0–0.5)
Eosinophils Relative: 3 %
HCT: 41.2 % (ref 39.0–52.0)
Hemoglobin: 13.7 g/dL (ref 13.0–17.0)
Immature Granulocytes: 2 %
Lymphocytes Relative: 13 %
Lymphs Abs: 2.2 10*3/uL (ref 0.7–4.0)
MCH: 27.9 pg (ref 26.0–34.0)
MCHC: 33.3 g/dL (ref 30.0–36.0)
MCV: 83.9 fL (ref 80.0–100.0)
MONOS PCT: 9 %
Monocytes Absolute: 1.5 10*3/uL — ABNORMAL HIGH (ref 0.1–1.0)
Neutro Abs: 13 10*3/uL — ABNORMAL HIGH (ref 1.7–7.7)
Neutrophils Relative %: 72 %
Platelets: 347 10*3/uL (ref 150–400)
RBC: 4.91 MIL/uL (ref 4.22–5.81)
RDW: 11.8 % (ref 11.5–15.5)
WBC: 17.6 10*3/uL — ABNORMAL HIGH (ref 4.0–10.5)
nRBC: 0 % (ref 0.0–0.2)

## 2018-03-20 LAB — COMPREHENSIVE METABOLIC PANEL
ALT: 22 U/L (ref 0–44)
AST: 15 U/L (ref 15–41)
Albumin: 2.7 g/dL — ABNORMAL LOW (ref 3.5–5.0)
Alkaline Phosphatase: 90 U/L (ref 38–126)
Anion gap: 12 (ref 5–15)
BILIRUBIN TOTAL: 0.6 mg/dL (ref 0.3–1.2)
BUN: 12 mg/dL (ref 6–20)
CO2: 24 mmol/L (ref 22–32)
Calcium: 8.6 mg/dL — ABNORMAL LOW (ref 8.9–10.3)
Chloride: 97 mmol/L — ABNORMAL LOW (ref 98–111)
Creatinine, Ser: 0.71 mg/dL (ref 0.61–1.24)
GFR calc Af Amer: >60 mL/min (ref >60)
Glucose, Bld: 196 mg/dL — ABNORMAL HIGH (ref 70–99)
Potassium: 3.5 mmol/L (ref 3.5–5.1)
Sodium: 133 mmol/L — ABNORMAL LOW (ref 135–145)
TOTAL PROTEIN: 7.1 g/dL (ref 6.5–8.1)

## 2018-03-20 LAB — GLUCOSE, CAPILLARY
Glucose-Capillary: 149 mg/dL — ABNORMAL HIGH (ref 70–99)
Glucose-Capillary: 212 mg/dL — ABNORMAL HIGH (ref 70–99)

## 2018-03-20 MED ORDER — ENOXAPARIN SODIUM 40 MG/0.4ML ~~LOC~~ SOLN
40.0000 mg | SUBCUTANEOUS | Status: DC
  Filled 2018-03-20 (×4): qty 0.4

## 2018-03-20 MED ORDER — ONDANSETRON HCL 4 MG/2ML IJ SOLN
4.0000 mg | Freq: Once | INTRAMUSCULAR | Status: AC
  Administered 2018-03-20: 4 mg via INTRAVENOUS
  Filled 2018-03-20: qty 2

## 2018-03-20 MED ORDER — ACETAMINOPHEN 650 MG RE SUPP: 650.0000 mg | Freq: Four times a day (QID) | RECTAL | Status: DC | PRN

## 2018-03-20 MED ORDER — INSULIN ASPART 100 UNIT/ML ~~LOC~~ SOLN
0.0000 [IU] | Freq: Every day | SUBCUTANEOUS | Status: DC
  Administered 2018-03-20 – 2018-03-23 (×4): 2 [IU] via SUBCUTANEOUS
  Administered 2018-03-24: 3 [IU] via SUBCUTANEOUS

## 2018-03-20 MED ORDER — HYDROCODONE-ACETAMINOPHEN 5-325 MG PO TABS
1.0000 | ORAL_TABLET | ORAL | Status: DC | PRN
  Administered 2018-03-20 – 2018-03-23 (×17): 2 via ORAL
  Filled 2018-03-20: qty 2
  Filled 2018-03-20: qty 1
  Filled 2018-03-20 (×2): qty 2
  Filled 2018-03-20: qty 1
  Filled 2018-03-20 (×13): qty 2

## 2018-03-20 MED ORDER — INSULIN GLARGINE 100 UNIT/ML ~~LOC~~ SOLN
20.0000 [IU] | Freq: Every day | SUBCUTANEOUS | Status: DC
  Administered 2018-03-20 – 2018-03-21 (×2): 20 [IU] via SUBCUTANEOUS
  Filled 2018-03-20 (×2): qty 0.2

## 2018-03-20 MED ORDER — ONDANSETRON HCL 4 MG/2ML IJ SOLN: 4.0000 mg | Freq: Four times a day (QID) | INTRAMUSCULAR | Status: DC | PRN

## 2018-03-20 MED ORDER — ACETAMINOPHEN 325 MG PO TABS: 650.0000 mg | ORAL_TABLET | Freq: Four times a day (QID) | ORAL | Status: DC | PRN

## 2018-03-20 MED ORDER — VANCOMYCIN HCL IN DEXTROSE 1-5 GM/200ML-% IV SOLN
1000.0000 mg | Freq: Once | INTRAVENOUS | Status: AC
  Administered 2018-03-20: 1000 mg via INTRAVENOUS
  Filled 2018-03-20: qty 200

## 2018-03-20 MED ORDER — ONDANSETRON HCL 4 MG PO TABS: 4.0000 mg | ORAL_TABLET | Freq: Four times a day (QID) | ORAL | Status: DC | PRN

## 2018-03-20 MED ORDER — LACTATED RINGERS IV SOLN
INTRAVENOUS | Status: DC
  Administered 2018-03-20 – 2018-03-25 (×10): via INTRAVENOUS

## 2018-03-20 MED ORDER — DIPHENHYDRAMINE HCL 50 MG/ML IJ SOLN
25.0000 mg | Freq: Once | INTRAMUSCULAR | Status: AC
  Administered 2018-03-20: 25 mg via INTRAVENOUS

## 2018-03-20 MED ORDER — INSULIN ASPART 100 UNIT/ML ~~LOC~~ SOLN
6.0000 [IU] | Freq: Three times a day (TID) | SUBCUTANEOUS | Status: DC
  Administered 2018-03-20 – 2018-03-22 (×5): 6 [IU] via SUBCUTANEOUS

## 2018-03-20 MED ORDER — IOHEXOL 300 MG/ML  SOLN
100.0000 mL | Freq: Once | INTRAMUSCULAR | Status: AC | PRN
  Administered 2018-03-20: 100 mL via INTRAVENOUS

## 2018-03-20 MED ORDER — INSULIN ASPART 100 UNIT/ML ~~LOC~~ SOLN
0.0000 [IU] | Freq: Three times a day (TID) | SUBCUTANEOUS | Status: DC
  Administered 2018-03-20: 2 [IU] via SUBCUTANEOUS
  Administered 2018-03-21: 3 [IU] via SUBCUTANEOUS
  Administered 2018-03-21: 5 [IU] via SUBCUTANEOUS
  Administered 2018-03-21 – 2018-03-22 (×4): 3 [IU] via SUBCUTANEOUS
  Administered 2018-03-23: 8 [IU] via SUBCUTANEOUS
  Administered 2018-03-23 (×2): 3 [IU] via SUBCUTANEOUS
  Administered 2018-03-24: 15 [IU] via SUBCUTANEOUS
  Administered 2018-03-24: 5 [IU] via SUBCUTANEOUS
  Administered 2018-03-25: 8 [IU] via SUBCUTANEOUS
  Administered 2018-03-25: 5 [IU] via SUBCUTANEOUS

## 2018-03-20 MED ORDER — HYDROMORPHONE HCL 1 MG/ML IJ SOLN
1.0000 mg | Freq: Once | INTRAMUSCULAR | Status: AC
  Administered 2018-03-20: 1 mg via INTRAVENOUS
  Filled 2018-03-20: qty 1

## 2018-03-20 MED ORDER — DIPHENHYDRAMINE HCL 50 MG/ML IJ SOLN
INTRAMUSCULAR | Status: AC
  Filled 2018-03-20: qty 1

## 2018-03-20 NOTE — ED Notes (Signed)
Patient refused blood draw for 2nd blood culture.

## 2018-03-20 NOTE — H&P (Signed)
Date: 03/20/2018               Patient Name:  Shaun Cummings MRN: [id number]  DOB: [birth year] Age / Sex: 43 y.o., male   PCP: Kathi Ludwig, MD         Medical Service: Internal Medicine Teaching Service         Attending Physician: Dr. Aldine Contes, MD    First Contact: Dr. Myrtie Hawk Pager: [phone]  Second Contact: Dr. Shan Levans Pager: [phone]       After Hours (After 5p/  First Contact Pager: [phone]  weekends / holidays): Second Contact Pager: [phone]   Chief Complaint: Neck skin infection  History of Present Illness: 43 y/o M with PMHx of insulin-dependent DM type II, hyperlipidemia, OSA, presented to ED due to worsening of skin swelling, discharge and pain on back of his head. Hi symptoms started a week ago with a painful bump on back of his head. He then developed some mild fever and was seen in IM clinic. He prescribed PO Doxycycline,  and recommended to return to clinic or ED if not improved. His symptoms gor worse in few days, he developed further swelling, and the bump began to have some discharge. He came to emergency department for where did not find to have indication for I & D and was sent home to continue PO antibiotic. How ever in past 2 days, he has had severe burning pain at the area, as well as headache and pressure behind his eyes. He was not able to sleep due to pain and discomfort, had fever around 101.5 and came to emergency department for further management today.  Meds:  Current Meds  Medication Sig  . doxycycline (VIBRA-TABS) 100 MG tablet Take 1 tablet (100 mg total) by mouth 2 (two) times daily.  Marland Kitchen gabapentin (NEURONTIN) 300 MG capsule Take 1 capsule (300 mg total) by mouth at bedtime as needed. (Patient taking differently: Take 300 mg by mouth at bedtime as needed. )  . Insulin Glargine (LANTUS SOLOSTAR) 100 UNIT/ML Solostar Pen Inject 30 Units into the skin daily at 10 pm.  . metFORMIN (GLUCOPHAGE-XR) 500 MG 24 hr tablet Take 1 tablet (500 mg  total) by mouth daily with breakfast.  . methocarbamol (ROBAXIN) 500 MG tablet Take 1 tablet (500 mg total) by mouth 2 (two) times daily.     Allergies: Allergies as of 03/20/2018 - Review Complete 03/20/2018  Allergen Reaction Noted  . Augmentin [amoxicillin-pot clavulanate] Hives and Itching 09/12/2013  . Contrast media [iodinated diagnostic agents] Hives 03/20/2018   Past Medical History:  Diagnosis Date  . Hyperlipidemia   . Lesion of penis    foreskin  . OSA (obstructive sleep apnea)    per pt dx osa and used cpap but after losing wt. from 415 pounds down to 244 pounds no longer needs cpap  . Peripheral neuropathy   . Phimosis   . Scrotal abscess 05/08/2017  . Type 2 diabetes mellitus (HCC)    per pt no meds for two years pt was changed to levemir unable to afford but pt states is waiting on approval for a program he applied for that will pay for his meds ([place])      Family History:  DM   Mother DM   Father DM   Brother DM   Grandfathers  Social History:  Never smoked, No Etoth use, No illicit drug use  Review of Systems: A complete ROS was negative except as per  HPI.   Physical Exam: Blood pressure 132/67, pulse 91, temperature 99.6 F (37.6 C), temperature source Oral, resp. rate 20, height 6' (1.829 m), weight 112 kg, SpO2 94 %. Physical Exam Constitutional:      Appearance: He is ill-appearing.     Comments: Patient is in pain (headache and eye pressure)  HENT:     Head:     Comments: Large Erythematous and swelling area at posterior inferior aspect of head surronding a deraigning papule/pustule. With tenderness. Cardiovascular:     Rate and Rhythm: Normal rate and regular rhythm.     Pulses: Normal pulses.     Heart sounds: Normal heart sounds. No murmur.  Pulmonary:     Effort: Pulmonary effort is normal.     Breath sounds: Normal breath sounds.  Abdominal:     General: Bowel sounds are normal.     Palpations: Abdomen is soft.      Tenderness: There is no abdominal tenderness.  Skin:    General: Skin is warm and dry.  Neurological:     General: No focal deficit present.     Mental Status: He is alert and oriented to person, place, and time.  Psychiatric:        Mood and Affect: Mood normal.        Behavior: Behavior normal.   CBC: Leukocytosis CMP: with mild hyponatremia  CT soft tissue neck:  Large area of edema in the left posterior neck soft tissues compatible with cellulitis. No drainable abscess. Reactive lymph nodes in the neck.  Assessment & Plan by Problem: Active Problems:   Cellulitis 43 year old gentleman with insulin-dependent type 2 diabetes, presented with worsening of skin infection on back of his head, fever, headache, despite taking p.o. doxycycline.  Cellulitis w/o abscess: Patient was seen in IM clinic and ED due to the same problem since a week ago.  Despite p.o. antibiotic, his symptoms worse and he developed further pain on the area, headache, pressure behind his eyes and fever and chills.  Patient with leukocytosis on arrival, CT scan showed cellulitis without draining abscess.  We will treat him with IV antibiotic and will control his pain. HE has Hx of diabetis that probably contribute to worsening of infection.  -IV Vancomycin 1 gr once -CBC tomorrow a.m. -CMP tomorrow a.m. -Tylenol 650 mg every 6 hours PRN -Norco every 4 hours PRN -F/u BC   Insulin dependant DM II: BG on arrival 196-200.  -Mod SSI -Lantus 20 u bedtime -Novolog 6 u TID  Diet: Carb modified IV fluid: None VTE ppx: Levonox Code status:  Dispo: Admit patient to Inpatient with expected length of stay greater than 2 midnights.  SignedDewayne Hatch, MD 03/20/2018, 6:38 PM  Pager: [phone]

## 2018-03-20 NOTE — ED Triage Notes (Signed)
TC from CT because of allergic reaction to IV contrast. Pt observed to have hive on forehead. And PT reported  Itching.

## 2018-03-20 NOTE — ED Provider Notes (Addendum)
[facility] EMERGENCY DEPARTMENT Provider Note   CSN: [id number] Arrival date & time: 03/20/18  0935    History   Chief Complaint Chief Complaint  Patient presents with  . Recurrent Skin Infections    HPI Shaun Cummings is a 43 y.o. male.     Patient complains of pain to the back of his neck.  He has been on antibiotics for 6 days for cellulitis in that area and it seems to be getting worse  The history is provided by the patient. No language interpreter was used.  Rash  Location: Neck posterior. Quality: not blistering   Severity:  Mild Onset quality:  Sudden Timing:  Constant Progression:  Worsening Chronicity:  New Context: not animal contact   Relieved by:  Nothing Worsened by:  Nothing Associated symptoms: no abdominal pain, no diarrhea, no fatigue and no headaches     Past Medical History:  Diagnosis Date  . Hyperlipidemia   . Lesion of penis    foreskin  . OSA (obstructive sleep apnea)    per pt dx osa and used cpap but after losing wt. from 415 pounds down to 244 pounds no longer needs cpap  . Peripheral neuropathy   . Phimosis   . Scrotal abscess 05/08/2017  . Type 2 diabetes mellitus (HCC)    per pt no meds for two years pt was changed to levemir unable to afford but pt states is waiting on approval for a program he applied for that will pay for his meds [facility])      Patient Active Problem List   Diagnosis Date Noted  . Cellulitis 03/20/2018  . Infected epithelial inclusion cyst 03/14/2018  . Hypertension 03/09/2018  . Peripheral neuropathy 05/21/2017  . Hyperlipidemia associated with type 2 diabetes mellitus ([place]) 09/18/2013  . Obesity (BMI 30-39.9) 09/18/2013  . Diabetes ([place]) 09/15/2013    Past Surgical History:  Procedure Laterality Date  . CIRCUMCISION N/A 02/10/2016   Procedure: CIRCUMCISION ADULT;  Surgeon: Cleon Gustin, MD;  Location: [facility];  Service: Urology;  Laterality: N/A;    . INCISION AND DRAINAGE ABSCESS Right 05/12/2017   Procedure: INCISION AND DRAINAGE RIGHT INGUINAL ABSCESS;  Surgeon: Erroll Luna, MD;  Location: [place];  Service: General;  Laterality: Right;  . INCISION AND DRAINAGE PERIRECTAL ABSCESS Right 05/09/2017   Procedure: IRRIGATION AND DEBRIDEMENT PERINEAL ABSCESS;  Surgeon: Donnie Mesa, MD;  Location: [place];  Service: General;  Laterality: Right;  . NO PAST SURGERIES          Home Medications    Prior to Admission medications   Medication Sig Start Date End Date Taking? Authorizing Provider  doxycycline (VIBRA-TABS) 100 MG tablet Take 1 tablet (100 mg total) by mouth 2 (two) times daily. 03/14/18  Yes Santos-Sanchez, Merlene Morse, MD  gabapentin (NEURONTIN) 300 MG capsule Take 1 capsule (300 mg total) by mouth at bedtime as needed. Patient taking differently: Take 300 mg by mouth at bedtime as needed.  03/09/18  Yes Kathi Ludwig, MD  Insulin Glargine (LANTUS SOLOSTAR) 100 UNIT/ML Solostar Pen Inject 30 Units into the skin daily at 10 pm. 03/09/18  Yes Kathi Ludwig, MD  metFORMIN (GLUCOPHAGE-XR) 500 MG 24 hr tablet Take 1 tablet (500 mg total) by mouth daily with breakfast. 03/09/18  Yes Kathi Ludwig, MD  methocarbamol (ROBAXIN) 500 MG tablet Take 1 tablet (500 mg total) by mouth 2 (two) times daily. 03/17/18  Yes Hedges, Dellis Filbert, PA-C  Blood Glucose Monitoring Suppl (CONTOUR  NEXT MONITOR) w/Device KIT 30 Units by Does not apply route at bedtime. 03/09/18   Kathi Ludwig, MD  glucose blood (CONTOUR NEXT TEST) test strip Use as instructed 03/09/18   Kathi Ludwig, MD    Family History Family History  Problem Relation Age of Onset  . Diabetes Mother   . Diabetes Father   . Diabetes Sister   . Diabetes Brother   . Diabetes Maternal Grandmother   . Diabetes Maternal Grandfather   . Diabetes Paternal Grandmother   . Diabetes Paternal Grandfather     Social History Social History   Tobacco Use  . Smoking  status: Former Smoker    Years: 2.00    Types: Cigarettes    Last attempt to quit: 02/05/2008    Years since quitting: 10.1  . Smokeless tobacco: Never Used  Substance Use Topics  . Alcohol use: No  . Drug use: No     Allergies   Augmentin [amoxicillin-pot clavulanate] and Contrast media [iodinated diagnostic agents]   Review of Systems Review of Systems  Constitutional: Negative for appetite change and fatigue.  HENT: Negative for congestion, ear discharge and sinus pressure.        Tenderness swelling to back of neck  Eyes: Negative for discharge.  Respiratory: Negative for cough.   Cardiovascular: Negative for chest pain.  Gastrointestinal: Negative for abdominal pain and diarrhea.  Genitourinary: Negative for frequency and hematuria.  Musculoskeletal: Negative for back pain.  Skin: Positive for rash.  Neurological: Negative for seizures and headaches.  Psychiatric/Behavioral: Negative for hallucinations.     Physical Exam Updated Vital Signs BP (!) 150/89   Pulse 86   Temp 97.9 F (36.6 C) (Oral)   Resp 19   Ht 6' (1.829 m)   Wt 116.6 kg   SpO2 98%   BMI 34.86 kg/m   Physical Exam Vitals signs and nursing note reviewed.  Constitutional:      Appearance: He is well-developed.  HENT:     Head: Normocephalic.     Comments: Large swollen tender red area to back of neck    Nose: Nose normal.  Eyes:     General: No scleral icterus.    Conjunctiva/sclera: Conjunctivae normal.  Neck:     Musculoskeletal: Neck supple.     Thyroid: No thyromegaly.  Cardiovascular:     Rate and Rhythm: Normal rate and regular rhythm.     Heart sounds: No murmur. No friction rub. No gallop.   Pulmonary:     Breath sounds: No stridor. No wheezing or rales.  Chest:     Chest wall: No tenderness.  Abdominal:     General: There is no distension.     Tenderness: There is no abdominal tenderness. There is no rebound.  Musculoskeletal: Normal range of motion.  Lymphadenopathy:      Cervical: No cervical adenopathy.  Skin:    Findings: No erythema or rash.  Neurological:     Mental Status: He is oriented to person, place, and time.     Motor: No abnormal muscle tone.     Coordination: Coordination normal.  Psychiatric:        Behavior: Behavior normal.      ED Treatments / Results  Labs (all labs ordered are listed, but only abnormal results are displayed) Labs Reviewed  CBC WITH DIFFERENTIAL/PLATELET - Abnormal; Notable for the following components:      Result Value   WBC 17.6 (*)    Neutro Abs 13.0 (*)  Monocytes Absolute 1.5 (*)    Abs Immature Granulocytes 0.31 (*)    All other components within normal limits  COMPREHENSIVE METABOLIC PANEL - Abnormal; Notable for the following components:   Sodium 133 (*)    Chloride 97 (*)    Glucose, Bld 196 (*)    Calcium 8.6 (*)    Albumin 2.7 (*)    All other components within normal limits  CULTURE, BLOOD (ROUTINE X 2)  CULTURE, BLOOD (ROUTINE X 2)    EKG None  Radiology Ct Soft Tissue Neck W Contrast  Result Date: 03/20/2018 CLINICAL DATA:  Posterior neck swelling.  Cellulitis. EXAM: CT NECK WITH CONTRAST TECHNIQUE: Multidetector CT imaging of the neck was performed using the standard protocol following the bolus administration of intravenous contrast. CONTRAST:  155m OMNIPAQUE IOHEXOL 300 MG/ML  SOLN COMPARISON:  None. FINDINGS: Pharynx and larynx: Normal. No mass or swelling. Salivary glands: No inflammation, mass, or stone. Thyroid: Negative Lymph nodes: Scattered small lymph nodes in the neck bilaterally. No pathologic enlarged or necrotic nodes. Vascular: Normal vascular enhancement. Limited intracranial: Negative Visualized orbits: Negative Mastoids and visualized paranasal sinuses: Mild mucosal edema right maxillary sinus. Remaining paranasal sinuses and mastoid sinus clear Skeleton: Dental caries.  No acute skeletal abnormality. Upper chest: Mild right upper lobe atelectasis. Other: Large area  of soft tissue swelling in the left posterior neck measures approximately 5 x 8 cm. No drainable abscess at this time. Findings most compatible with severe cellulitis. IMPRESSION: Large area of edema in the left posterior neck soft tissues compatible with cellulitis. No drainable abscess. Reactive lymph nodes in the neck. Electronically Signed   By: CFranchot GalloM.D.   On: 03/20/2018 12:37    Procedures Procedures (including critical care time)  Medications Ordered in ED Medications  diphenhydrAMINE (BENADRYL) 50 MG/ML injection (has no administration in time range)  HYDROmorphone (DILAUDID) injection 1 mg (has no administration in time range)  ondansetron (ZOFRAN) injection 4 mg (has no administration in time range)  enoxaparin (LOVENOX) injection 40 mg (has no administration in time range)  acetaminophen (TYLENOL) tablet 650 mg (has no administration in time range)    Or  acetaminophen (TYLENOL) suppository 650 mg (has no administration in time range)  HYDROcodone-acetaminophen (NORCO/VICODIN) 5-325 MG per tablet 1-2 tablet (has no administration in time range)  ondansetron (ZOFRAN) tablet 4 mg (has no administration in time range)    Or  ondansetron (ZOFRAN) injection 4 mg (has no administration in time range)  vancomycin (VANCOCIN) IVPB 1000 mg/200 mL premix (1,000 mg Intravenous New Bag/Given 03/20/18 1022)  iohexol (OMNIPAQUE) 300 MG/ML solution 100 mL (100 mLs Intravenous Contrast Given 03/20/18 1213)  diphenhydrAMINE (BENADRYL) injection 25 mg (25 mg Intravenous Given 03/20/18 1203)     Initial Impression / Assessment and Plan / ED Course  I have reviewed the triage vital signs and the nursing notes.  Pertinent labs & imaging results that were available during my care of the patient were reviewed by me and considered in my medical decision making (see chart for details).    CRITICAL CARE Performed by: JMilton FergusonTotal critical care time:30 minutes Critical care time was  exclusive of separately billable procedures and treating other patients. Critical care was necessary to treat or prevent imminent or life-threatening deterioration. Critical care was time spent personally by me on the following activities: development of treatment plan with patient and/or surrogate as well as nursing, discussions with consultants, evaluation of patient's response to treatment, examination of patient, obtaining history  from patient or surrogate, ordering and performing treatments and interventions, ordering and review of laboratory studies, ordering and review of radiographic studies, pulse oximetry and re-evaluation of patient's condition.     CT scan shows cellulitis to neck.  Patient is placed on IV antibiotics and will be admitted to medicine Final Clinical Impressions(s) / ED Diagnoses   Final diagnoses:  Cellulitis of neck    ED Discharge Orders    None       Milton Ferguson, MD 03/20/18 1355    Milton Ferguson, MD 04/03/18 [phone]

## 2018-03-20 NOTE — ED Triage Notes (Signed)
PT in room  From CT Dept.

## 2018-03-20 NOTE — Progress Notes (Signed)
New Admission Note:   Arrival Method: Bed Mental Orientation: A&O X4 Telemetry: Not Ordered Assessment: Completed Skin: See Flowsheets IV: WDL Pain: 8/10 Safety Measures: Safety Fall Prevention Plan has been given, discussed and signed Admission: Completed Unit Orientation: Patient has been orientated to the room, unit and staff.   Orders have been reviewed and implemented. Will continue to monitor the patient. Call light has been placed within reach and bed alarm has been activated.    Aneta Mins BSN, RN3

## 2018-03-20 NOTE — ED Triage Notes (Signed)
Cellulitis to posterior head . Pt has completed PO antbx. But returns today because the area is larger

## 2018-03-21 LAB — GLUCOSE, CAPILLARY
Glucose-Capillary: 177 mg/dL — ABNORMAL HIGH (ref 70–99)
Glucose-Capillary: 217 mg/dL — ABNORMAL HIGH (ref 70–99)
Glucose-Capillary: 187 mg/dL — ABNORMAL HIGH (ref 70–99)
Glucose-Capillary: 233 mg/dL — ABNORMAL HIGH (ref 70–99)

## 2018-03-21 LAB — COMPREHENSIVE METABOLIC PANEL
ALT: 24 U/L (ref 0–44)
AST: 13 U/L — ABNORMAL LOW (ref 15–41)
Albumin: 2.5 g/dL — ABNORMAL LOW (ref 3.5–5.0)
Alkaline Phosphatase: 98 U/L (ref 38–126)
Anion gap: 9 (ref 5–15)
BUN: 10 mg/dL (ref 6–20)
CO2: 28 mmol/L (ref 22–32)
Calcium: 8.5 mg/dL — ABNORMAL LOW (ref 8.9–10.3)
Chloride: 97 mmol/L — ABNORMAL LOW (ref 98–111)
Creatinine, Ser: 0.72 mg/dL (ref 0.61–1.24)
GFR calc non Af Amer: >60 mL/min (ref >60)
Glucose, Bld: 197 mg/dL — ABNORMAL HIGH (ref 70–99)
Potassium: 3.3 mmol/L — ABNORMAL LOW (ref 3.5–5.1)
Sodium: 134 mmol/L — ABNORMAL LOW (ref 135–145)
Total Bilirubin: 0.7 mg/dL (ref 0.3–1.2)
Total Protein: 7.4 g/dL (ref 6.5–8.1)

## 2018-03-21 LAB — CBC
HCT: 40.9 % (ref 39.0–52.0)
Hemoglobin: 13.7 g/dL (ref 13.0–17.0)
MCH: 28.2 pg (ref 26.0–34.0)
MCHC: 33.5 g/dL (ref 30.0–36.0)
MCV: 84.3 fL (ref 80.0–100.0)
Platelets: 350 10*3/uL (ref 150–400)
RBC: 4.85 MIL/uL (ref 4.22–5.81)
RDW: 11.9 % (ref 11.5–15.5)
WBC: 17.3 10*3/uL — ABNORMAL HIGH (ref 4.0–10.5)
nRBC: 0 % (ref 0.0–0.2)

## 2018-03-21 MED ORDER — POTASSIUM CHLORIDE CRYS ER 20 MEQ PO TBCR
40.0000 meq | EXTENDED_RELEASE_TABLET | Freq: Once | ORAL | Status: AC
  Administered 2018-03-21: 40 meq via ORAL
  Filled 2018-03-21: qty 2

## 2018-03-21 MED ORDER — VANCOMYCIN HCL 10 G IV SOLR
1750.0000 mg | Freq: Once | INTRAVENOUS | Status: AC
  Administered 2018-03-21: 1750 mg via INTRAVENOUS
  Filled 2018-03-21: qty 1750

## 2018-03-21 MED ORDER — VANCOMYCIN HCL IN DEXTROSE 1-5 GM/200ML-% IV SOLN
1000.0000 mg | Freq: Three times a day (TID) | INTRAVENOUS | Status: DC
  Administered 2018-03-21 – 2018-03-25 (×11): 1000 mg via INTRAVENOUS
  Filled 2018-03-21 (×13): qty 200

## 2018-03-21 MED ORDER — GLUCERNA SHAKE PO LIQD
237.0000 mL | Freq: Two times a day (BID) | ORAL | Status: DC
  Administered 2018-03-21: 237 mL via ORAL
  Filled 2018-03-21 (×9): qty 237

## 2018-03-21 NOTE — Progress Notes (Signed)
Initial Nutrition Assessment  DOCUMENTATION CODES:   Non-severe (moderate) malnutrition in context of acute illness/injury, Obesity unspecified  INTERVENTION:   Glucerna BID (each provides 220 kcal, 10 g protein)   NUTRITION DIAGNOSIS:   Moderate Malnutrition related to acute illness as evidenced by energy intake < or equal to 50% for > or equal to 5 days, percent weight loss.   GOAL:   Patient will meet greater than or equal to 90% of their needs   MONITOR:   PO intake, Supplement acceptance, Labs, Skin, Weight trends  REASON FOR ASSESSMENT:   Malnutrition Screening Tool    ASSESSMENT:   43 yo male, admitted with cellulitis. PMH significant for insulin-dependent DM type 2, HLD, OSA.  Labs: sodium 134, potassium 3.3, chloride 97, glucose 197, AST elevated Meds: Novolog, Lantus, LR 100 mL/hr  Pt awake and laying on side in bed. Reports poor appetite and very poor PO intake x 1 week. Endorses 11 lb wt loss x 10 days - confirmed in chart. Pt states he typically has daily BM and has not had one in 4-5 days. Notes he has not been eating either. Reports eating 100% of dinner last night. Encouraged pt to include protein-rich foods with all meals and snacks, and to eat those foods first if not feeling hungry. Amenable to trying Glucerna in chocolate BID.  NUTRITION - FOCUSED PHYSICAL EXAM: Deferred d/t pt discomfort - pt appears adequately nourished, but will need NFPE if present for follow-up  Diet Order:  No known food allergies Diet Order            Diet Carb Modified Fluid consistency: Thin; Room service appropriate? Yes  Diet effective now             100% x 2 meals recorded (noted multiple 0% documented outside regular meal times)  EDUCATION NEEDS:  No education needs have been identified at this time  Skin:  Skin Assessment: Skin Integrity Issues: Skin Integrity Issues:: Other (Comment) Other: Cellulitis- posterior head  Last BM:  2/20  Height:  Ht  Readings from Last 1 Encounters:  03/20/18 6' (1.829 m)    Weight:  Wt Readings from Last 10 Encounters:  03/20/18 112 kg  03/14/18 116.9 kg  03/09/18 117.3 kg  06/04/17 110.7 kg  05/21/17 105.8 kg  05/11/17 108.9 kg  05/08/17 108.9 kg  Noted 5.3 kg wt loss x 11 days = 4.5% may be indicative of malnutrition  Ideal Body Weight:  80.9 kg  BMI:  Body mass index is 33.49 kg/m. obese  Estimated Nutritional Needs:   Kcal:  0160-1093 (14-18 kcal/kg)  Protein:  97-122 gm (1.2-1.5 g/kg IBW)  Fluid:  1 mL/kcal or per MD  Althea Grimmer, MS, RDN, LDN Pager: [phone] Available Mondays and Fridays, 9am-2pm

## 2018-03-21 NOTE — Progress Notes (Addendum)
   Subjective: We saw and evaluated Shaun Cummings bedside during morning ground today.  He feels much better. Headache and pressure behind his eyes are better. He reports drainage from the bump, that made his pillow wet. Pain is better.   Objective:  Vital signs in last 24 hours: Vitals:   03/20/18 1400 03/20/18 1430 03/20/18 1529 03/20/18 2103  BP: (!) 143/74 (!) 136/96 132/67 113/64  Pulse: 86 89 91 79  Resp:   20 19  Temp:   99.6 F (37.6 C) 98.4 F (36.9 C)  TempSrc:   Oral   SpO2: 97% 97% 94% 94%  Weight:   112 kg 112 kg  Height:   6' (1.829 m)      Assessment/Plan:  Active Problems:   Cellulitis  43 year old gentleman with insulin-dependent type 2 diabetes, presented with worsening of skin infection on back of his head, fever, headache, despite taking p.o. doxycycline.Cellulitis w/o abscess: Patient was seen in IM clinic and ED due to the same problem since a week ago.  Despite p.o. antibiotic, his symptoms worse and he developed further pain on the area, headache, pressure behind his eyes and fever and chills.  Cellulitis around papule/pustule w/o draining abscess: Likely 2/2 DM Patient with leukocytosis on arrival, CT scan showed cellulitis without abscess . We will treat him with IV antibiotic and will control his pain. HE has Hx of diabetis that probably contribute to worsening of infection.  Assessment:   improved with IV AB. Still has leukocytosis at 17.3 was (17.6 yesterday) no fever or chills.  Blood cultures pending.  Plan: We will continue IV antibiotic (considering he did not respond to p.o. antibiotic before and having systemic symptoms), will reevaluate tomorrow and if no improvement, may consider POC [place] and consult general surgery for procedure on cyst/bump).    -Continue IV Vancomycin per pharmacy -K dur 40 meq PO (K:3.3 today) -CBC daily -CMP daily -Tylenol 650 mg every 6 hours PRN -Norco every 4 hours PRN  -F/u BC -Asked the nurse to place bandage on  the draining area.  Insulin dependant DM II: BG on arrival 196-200.  -Mod SSI -Continue Lantus 20 u bedtime -Continue Novolog 6 u TID -CBG monitor  Diet: Carb modified IV fluid: None VTE ppx: Levonox Code status: full   Dispo: Anticipated discharge in approximately 1-2 days.   Dewayne Hatch, MD 03/21/2018, 4:59 AM Pager: [phone]

## 2018-03-21 NOTE — Progress Notes (Signed)
Pharmacy Antibiotic Note  Shaun Cummings is a 43 y.o. male admitted on 03/20/2018 with cellulitis not responding to PO doxycycline.  Pharmacy has been consulted for Vancomycin dosing.  Plan: Vancomycin 1750mg  IV x 1 Vancomycin 1000mg  IV q8h Monitor vancomycin levels as needed Monitor clinical course, LOT, and deescalation plan  Height: 6' (182.9 cm) Weight: 246 lb 14.6 oz (112 kg) IBW/kg (Calculated) : 77.6  Temp (24hrs), Avg:98.4 F (36.9 C), Min:97.9 F (36.6 C), Max:99.6 F (37.6 C)  Recent Labs  Lab 03/20/18 1015 03/21/18 0341  WBC 17.6* 17.3*  CREATININE 0.71 0.72    Estimated Creatinine Clearance: 158.7 mL/min (by C-G formula based on SCr of 0.72 mg/dL).    Allergies  Allergen Reactions  . Augmentin [Amoxicillin-Pot Clavulanate] Hives and Itching    Has patient had a PCN reaction causing immediate rash, facial/tongue/throat swelling, SOB or lightheadedness with hypotension: Yes Has patient had a PCN reaction causing severe rash involving mucus membranes or skin necrosis: No Has patient had a PCN reaction that required hospitalization: No Has patient had a PCN reaction occurring within the last 10 years: No If all of the above answers are "NO", then may proceed with Cephalosporin use.  . Contrast Media [Iodinated Diagnostic Agents] Hives    Pt broke out with 1 hive on forehead after CT injection-treated with 25mg  Benadryl    Antimicrobials this admission: 2/23 vancomycin >>    Microbiology results: 2/23 BCx: NGTD   Tomislav Micale A. Levada Dy, PharmD, [place] Please utilize Amion for appropriate phone number to reach the unit pharmacist ([place])   03/21/2018 8:54 AM

## 2018-03-22 DIAGNOSIS — E440 Moderate protein-calorie malnutrition: Secondary | ICD-10-CM

## 2018-03-22 LAB — CBC
HCT: 38.9 % — ABNORMAL LOW (ref 39.0–52.0)
Hemoglobin: 13.1 g/dL (ref 13.0–17.0)
MCH: 28.4 pg (ref 26.0–34.0)
MCHC: 33.7 g/dL (ref 30.0–36.0)
MCV: 84.4 fL (ref 80.0–100.0)
Platelets: 297 10*3/uL (ref 150–400)
RBC: 4.61 MIL/uL (ref 4.22–5.81)
RDW: 11.9 % (ref 11.5–15.5)
WBC: 14.2 10*3/uL — ABNORMAL HIGH (ref 4.0–10.5)
nRBC: 0 % (ref 0.0–0.2)

## 2018-03-22 LAB — BASIC METABOLIC PANEL
Anion gap: 9 (ref 5–15)
BUN: 8 mg/dL (ref 6–20)
CO2: 25 mmol/L (ref 22–32)
Calcium: 8.3 mg/dL — ABNORMAL LOW (ref 8.9–10.3)
Chloride: 101 mmol/L (ref 98–111)
Creatinine, Ser: 0.55 mg/dL — ABNORMAL LOW (ref 0.61–1.24)
GFR calc Af Amer: >60 mL/min (ref >60)
GFR calc non Af Amer: >60 mL/min (ref >60)
GLUCOSE: 191 mg/dL — AB (ref 70–99)
Potassium: 3.8 mmol/L (ref 3.5–5.1)
Sodium: 135 mmol/L (ref 135–145)

## 2018-03-22 LAB — GLUCOSE, CAPILLARY
Glucose-Capillary: 171 mg/dL — ABNORMAL HIGH (ref 70–99)
Glucose-Capillary: 181 mg/dL — ABNORMAL HIGH (ref 70–99)
Glucose-Capillary: 190 mg/dL — ABNORMAL HIGH (ref 70–99)
Glucose-Capillary: 236 mg/dL — ABNORMAL HIGH (ref 70–99)

## 2018-03-22 MED ORDER — SENNA 8.6 MG PO TABS
1.0000 | ORAL_TABLET | Freq: Every day | ORAL | Status: DC | PRN
  Administered 2018-03-23: 8.6 mg via ORAL
  Filled 2018-03-22: qty 1

## 2018-03-22 MED ORDER — INSULIN GLARGINE 100 UNIT/ML ~~LOC~~ SOLN
22.0000 [IU] | Freq: Every day | SUBCUTANEOUS | Status: DC
  Administered 2018-03-22 – 2018-03-24 (×3): 22 [IU] via SUBCUTANEOUS
  Filled 2018-03-22 (×4): qty 0.22

## 2018-03-22 MED ORDER — INSULIN ASPART 100 UNIT/ML ~~LOC~~ SOLN
7.0000 [IU] | Freq: Three times a day (TID) | SUBCUTANEOUS | Status: DC
  Administered 2018-03-22 – 2018-03-25 (×9): 7 [IU] via SUBCUTANEOUS

## 2018-03-22 NOTE — Progress Notes (Signed)
   Subjective: Patient was seen and evaluated at bedside on morning rounds. No acute events overnight.  His pain is better but has some discomfort in the cellulitis area. No other acute complaints.  Objective:  Vital signs in last 24 hours: Vitals:   03/21/18 0846 03/21/18 1817 03/21/18 2120 03/22/18 0426  BP: 107/66 126/73 118/67 125/79  Pulse: 75 76 75 64  Resp: 18 18 19 18   Temp: 98 F (36.7 C) 98.8 F (37.1 C) 98.7 F (37.1 C) 98.4 F (36.9 C)  TempSrc: Oral Oral Oral   SpO2: 97% 97% 96% 97%  Weight:   112.2 kg   Height:       Physical Exam  Constitutional: Appears well-developed and well-nourished. No distress.  HENT:  Head: Erythematous and swelling area at posterior inferior aspect of head improved. Papule/pustule in the middle with mild drainage. Eyes: Conjunctivae are normal.  Cardiovascular: Normal rate, regular rhythm and normal heart sounds.  Respiratory: Effort normal and breath sounds normal. No respiratory distress. No wheezes.  GI: Soft. Bowel sounds are normal. No distension. There is no tenderness.  Musculoskeletal: No edema.  Neurological: Is alert.  Skin: Not diaphoretic. No erythema.  Psychiatric: Normal mood and affect. Behavior is normal. Judgment and thought content normal.    Assessment/Plan:  Active Problems:   Cellulitis   Malnutrition of moderate degree  43 year old gentleman with insulin-dependent type 2 diabetes, presented with worsening of skin infectionon back of his head, fever, headache,despite taking p.o. doxycycline.Cellulitis w/o abscess:Patient was seen in IM clinic and ED due to the same problem since a week ago. Despite p.o. antibiotic, his symptoms worse and he developed further pain on the area, headache, pressure behind his eyes and fever and chills.  Cellulitis around papule/pustule w/o draining abscess: Likely 2/2 DM Patient with leukocytosis on arrival, CT scan showed cellulitis without abscess.  He had worsening of his  symptoms despite taking p.o. doxycycline that prescribed about 10 days ago clinic.  HE has Hx of diabetis that probably contribute to worsening of infection.  He has been on IV vancomycin here. Pain, swelling and erythema is improving with IV Vanc. no fever or chills. leukocytosis at 17.3 was (17.6 yesterday). BC with NO GROWTH < 24 h.  POC [place] did not show any drainable cyst or abscess.  We will continue IV antibiotic today and pain control and re erevaluate tomorrow, to see if we can switch to p.o. antibiotic.  -Continue IV Vancomycin per pharmacy -CBC daily -CMP daily -Tylenol 650 mg every 6 hours PRN -Norco every 4 hours PRN  -F/u BC  Insulin dependant DM II: BG elevated at 170s-230s Increased bedtime Lantus to 22 u and meal time Novolog to 7 u.   -Continue Mod SSI -Increase Lantus to 22 u bedtime -Increase Novolog to 7 u TID -CBG monitor  Diet:Carb modified IV fluid:None VTE QJJ:HERDEYC Code status: full   Dispo: Anticipated discharge in approximately 1-2 days  Dewayne Hatch, MD 03/22/2018, 4:55 AM Pager: [phone]

## 2018-03-23 DIAGNOSIS — D72829 Elevated white blood cell count, unspecified: Secondary | ICD-10-CM

## 2018-03-23 LAB — CBC WITH DIFFERENTIAL/PLATELET
Abs Immature Granulocytes: 0.33 10*3/uL — ABNORMAL HIGH (ref 0.00–0.07)
Basophils Absolute: 0.1 10*3/uL (ref 0.0–0.1)
Basophils Relative: 1 %
Eosinophils Absolute: 0.5 10*3/uL (ref 0.0–0.5)
Eosinophils Relative: 3 %
HCT: 40 % (ref 39.0–52.0)
HEMOGLOBIN: 13.5 g/dL (ref 13.0–17.0)
Immature Granulocytes: 2 %
Lymphocytes Relative: 16 %
Lymphs Abs: 2.4 10*3/uL (ref 0.7–4.0)
MCH: 28.2 pg (ref 26.0–34.0)
MCHC: 33.8 g/dL (ref 30.0–36.0)
MCV: 83.5 fL (ref 80.0–100.0)
MONO ABS: 1 10*3/uL (ref 0.1–1.0)
Monocytes Relative: 6 %
Neutro Abs: 11.2 10*3/uL — ABNORMAL HIGH (ref 1.7–7.7)
Neutrophils Relative %: 72 %
Platelets: 341 10*3/uL (ref 150–400)
RBC: 4.79 MIL/uL (ref 4.22–5.81)
RDW: 11.7 % (ref 11.5–15.5)
WBC: 15.6 10*3/uL — ABNORMAL HIGH (ref 4.0–10.5)
nRBC: 0 % (ref 0.0–0.2)

## 2018-03-23 LAB — GLUCOSE, CAPILLARY
GLUCOSE-CAPILLARY: 170 mg/dL — AB (ref 70–99)
Glucose-Capillary: 274 mg/dL — ABNORMAL HIGH (ref 70–99)
Glucose-Capillary: 185 mg/dL — ABNORMAL HIGH (ref 70–99)
Glucose-Capillary: 230 mg/dL — ABNORMAL HIGH (ref 70–99)

## 2018-03-23 LAB — BASIC METABOLIC PANEL
Anion gap: 9 (ref 5–15)
BUN: 7 mg/dL (ref 6–20)
CHLORIDE: 98 mmol/L (ref 98–111)
CO2: 28 mmol/L (ref 22–32)
Calcium: 8.4 mg/dL — ABNORMAL LOW (ref 8.9–10.3)
Creatinine, Ser: 0.6 mg/dL — ABNORMAL LOW (ref 0.61–1.24)
GFR calc Af Amer: >60 mL/min (ref >60)
GFR calc non Af Amer: >60 mL/min (ref >60)
Glucose, Bld: 195 mg/dL — ABNORMAL HIGH (ref 70–99)
Potassium: 3.9 mmol/L (ref 3.5–5.1)
SODIUM: 135 mmol/L (ref 135–145)

## 2018-03-23 MED ORDER — HYDROCODONE-ACETAMINOPHEN 10-325 MG PO TABS
1.0000 | ORAL_TABLET | ORAL | Status: DC | PRN
  Administered 2018-03-23 – 2018-03-24 (×6): 1 via ORAL
  Filled 2018-03-23 (×6): qty 1

## 2018-03-23 MED ORDER — ENOXAPARIN SODIUM 40 MG/0.4ML ~~LOC~~ SOLN
40.0000 mg | SUBCUTANEOUS | Status: DC
  Administered 2018-03-24: 40 mg via SUBCUTANEOUS
  Filled 2018-03-23: qty 0.4

## 2018-03-23 MED ORDER — SENNA 8.6 MG PO TABS
1.0000 | ORAL_TABLET | Freq: Every day | ORAL | Status: DC
  Administered 2018-03-25: 8.6 mg via ORAL
  Filled 2018-03-23 (×2): qty 1

## 2018-03-23 MED ORDER — ACETAMINOPHEN 325 MG PO TABS: 650.0000 mg | ORAL_TABLET | ORAL | Status: DC | PRN

## 2018-03-23 MED ORDER — HYDROMORPHONE HCL 1 MG/ML IJ SOLN
1.0000 mg | INTRAMUSCULAR | Status: DC | PRN
  Administered 2018-03-25: 1 mg via INTRAVENOUS
  Filled 2018-03-23 (×2): qty 1

## 2018-03-23 NOTE — Progress Notes (Signed)
   Subjective: Patient was seen and evaluated at bedside on morning rounds. No acute events overnight. He endorses continued pain on the back of his head, he reports that the pain medications don't last as long. No other acute complaints.  Objective:  Vital signs in last 24 hours: Vitals:   03/22/18 0927 03/22/18 1631 03/22/18 2118 03/23/18 0516  BP: 125/69 (!) 148/68 136/69 136/75  Pulse: 69 69 84 69  Resp:  18 19 18   Temp: 97.7 F (36.5 C) 99.4 F (37.4 C) 99 F (37.2 C) 98.3 F (36.8 C)  TempSrc: Oral Oral Oral Oral  SpO2: 95% 97% 98% 95%  Weight:      Height:       Physical Exam  Constitutional: Appears well-developed and well-nourished. No distress.  HENT:  Head: Erythematous and swelling area at posterior inferior aspect of head , today is worse. Papule/pustule in the middle with mild drainage. Feeling some fluid/fluctuation? Eyes: Conjunctivae are normal.  Cardiovascular: Normal rate, regular rhythm and normal heart sounds.  Respiratory: Effort normal and breath sounds normal. No respiratory distress. No wheezes.  GI: Soft. Bowel sounds are normal. No distension. There is no tenderness.  Musculoskeletal: No edema.  Neurological: Is alert.  Skin: Not diaphoretic. No erythema.  Psychiatric: Normal mood and affect. Behavior is normal. Judgment and thought content normal.    Assessment/Plan:  Active Problems:   Cellulitis   Malnutrition of moderate degree  43 year old gentleman with insulin-dependent type 2 diabetes, presented with worsening of skin infectionon back of his head, fever, headache,despite taking p.o. doxycycline.Cellulitis w/o abscess:Patient was seen in IM clinic and ED due to the same problem since a week ago. Despite p.o. antibiotic, his symptoms worse and he developed further pain on the area, headache, pressure behind his eyes and fever and chills.  Cellulitis around papule/pustule w/odraining abscess: Likely 2/2 DM Patient with leukocytosis on  arrival, CT scan showed cellulitis withoutabscess.  He had worsening of his symptoms despite taking p.o. doxycycline that prescribed about 10 days ago clinic.  HE has Hx of diabetis that probably contribute to worsening of infection.  He has been on IV vancomycin here.  Gradually improving.  Leukocytosis improved to 15.6 today.  (17.6 on arrival) [facility] with NO GROWTH so far. But still swelling and erythematous.  Some fluid on exma. Fluctuation? Will consult surgery to evaluate for draignage  -Continue IV Vancomycin -Consult surgery--> Recommended to OR tomorrow for draignage -NPO after midnight -CBCdaily -CMPdaily -Norco 10-325 q4h for pain -Dilaudid 1 mg q4h PRN -F/u BC -f/u surgery recommendation  Insulin dependant DM II:   -Continue Mod SSI -ContinueLantus to 22 u bedtime -ContinueNovolog to 7 u TID -CBG monitor    Diet:Carb modified IV fluid:None VTE [id number] Code status: full  Dispo: Anticipated discharge in approximately 2-3 days  Dewayne Hatch, MD 03/23/2018, 5:41 AM Pager: [phone]

## 2018-03-23 NOTE — Consult Note (Addendum)
Reason for Consult: Cellulitis Referring Physician: Dr. Yetta Barre Cummings is an 43 y.o. male.  HPI: Patient is a 43 year old male with uncontrolled diabetes and hypertension.  He is followed by the internal medicine family practice clinic.  Hemoglobin A1c was greater than 14 on 09/08/2017 and on repeat March 09, 2018 it was > 14.  He was having trouble obtaining medicines.  He was out of his medicines from December up until February of this year.  Work-up at that time showed what looked like a large walled off fluid collection in the base of his scalp.  It measures about 8x10cm, there is tenderness, no warmth or redness of the skin, one small papule at the center but no drainage, skin is thick and the fluid collection feels about 1cm deep. This has been progressive for one week, developed a fever today.he returned to the clinic again on 03/14/2018 because of the cyst on his scalp.  He developed fevers and again his glucose was in the 400-500 range.  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  He was treated with doxycycline 100 mg twice daily.  He returned to the ED on 03/17/2018, and again on 03/20/2018.  He completed his course of antibiotics but said the area was larger.    Patient was hospitalized last year, 05/08/2017, and underwent I&D of right groin and scrotal and perineal abscess on 05/09/2017, and again on 05/12/2017 by Dr. Georgette Cummings and Dr. Brantley Cummings respectively.  They grew out MODERATE GROUP B STREP(S.AGALACTIAE)ISOLATED, & MODERATE PREVOTELLA BIVIA  from cultures at that time.   Work-up on admission  03/20/18, showed the patient was afebrile blood pressure was up slightly.  No tachycardia.  Labs show a glucose of 196, WBC of 17.6 CT soft tissue neck with contrast shows scattered lymph nodes in the neck.  There is a large area of soft tissue swelling in the left posterior neck measuring 5 x 8 cm no drainable abscess was seen findings were compatible with  severe cellulitis.  Patient was hospitalized and started on IV vancomycin without improvement and we are asked to see.  Past Medical History:  Diagnosis Date  . Hyperlipidemia   . Lesion of penis    foreskin  . OSA (obstructive sleep apnea)    per pt dx osa and used cpap but after losing wt. from 415 pounds down to 244 pounds no longer needs cpap  . Peripheral neuropathy   . Phimosis   . Scrotal abscess 05/08/2017  . Type 2 diabetes mellitus (HCC)    per pt no meds for two years pt was changed to levemir unable to afford but pt states is waiting on approval for a program he applied for that will pay for his meds [facility])      Past Surgical History:  Procedure Laterality Date  . CIRCUMCISION N/A 02/10/2016   Procedure: CIRCUMCISION ADULT;  Surgeon: Shaun Gustin, MD;  Location: [facility];  Service: Urology;  Laterality: N/A;  . INCISION AND DRAINAGE ABSCESS Right 05/12/2017   Procedure: INCISION AND DRAINAGE RIGHT INGUINAL ABSCESS;  Surgeon: Shaun Luna, MD;  Location: [facility];  Service: General;  Laterality: Right;  . INCISION AND DRAINAGE PERIRECTAL ABSCESS Right 05/09/2017   Procedure: IRRIGATION AND DEBRIDEMENT PERINEAL ABSCESS;  Surgeon: Shaun Mesa, MD;  Location: [facility];  Service: General;  Laterality: Right;  . NO PAST SURGERIES      Family History  Problem Relation  Age of Onset  . Diabetes Mother   . Diabetes Father   . Diabetes Sister   . Diabetes Brother   . Diabetes Maternal Grandmother   . Diabetes Maternal Grandfather   . Diabetes Paternal Grandmother   . Diabetes Paternal Grandfather     Social History:  reports that he quit smoking about 10 years ago. His smoking use included cigarettes. He quit after 2.00 years of use. He has never used smokeless tobacco. He reports that he does not drink alcohol or use drugs. 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  He has no insurance with this job. Alcohol  none Drugs: None Tobacco: Quit 7-8 years ago Drugs: None   Allergies:  Allergies  Allergen Reactions  . Augmentin [Amoxicillin-Pot Clavulanate] Hives and Itching    Has patient had a PCN reaction causing immediate rash, facial/tongue/throat swelling, SOB or lightheadedness with hypotension: Yes Has patient had a PCN reaction causing severe rash involving mucus membranes or skin necrosis: No Has patient had a PCN reaction that required hospitalization: No Has patient had a PCN reaction occurring within the last 10 years: No If all of the above answers are "NO", then may proceed with Cephalosporin use.  . Contrast Media [Iodinated Diagnostic Agents] Hives    Pt broke out with 1 hive on forehead after CT injection-treated with 52m Benadryl    Medications:  Prior to Admission:  Medications Prior to Admission  Medication Sig Dispense Refill Last Dose  . doxycycline (VIBRA-TABS) 100 MG tablet Take 1 tablet (100 mg total) by mouth 2 (two) times daily. 14 tablet 0 03/20/2018 at Unknown time  . gabapentin (NEURONTIN) 300 MG capsule Take 1 capsule (300 mg total) by mouth at bedtime as needed. (Patient taking differently: Take 300 mg by mouth at bedtime as needed. ) 90 capsule 0 03/19/2018 at Unknown time  . Insulin Glargine (LANTUS SOLOSTAR) 100 UNIT/ML Solostar Pen Inject 30 Units into the skin daily at 10 pm. 3 mL 7 03/19/2018 at Unknown time  . metFORMIN (GLUCOPHAGE-XR) 500 MG 24 hr tablet Take 1 tablet (500 mg total) by mouth daily with breakfast. 90 tablet 1 03/20/2018 at Unknown time  . methocarbamol (ROBAXIN) 500 MG tablet Take 1 tablet (500 mg total) by mouth 2 (two) times daily. 20 tablet 0 03/19/2018 at Unknown time  . Blood Glucose Monitoring Suppl (CONTOUR NEXT MONITOR) w/Device KIT 30 Units by Does not apply route at bedtime. 1 kit 0   . glucose blood (CONTOUR NEXT TEST) test strip Use as instructed 100 each 1    Scheduled: . enoxaparin (LOVENOX) injection  40 mg Subcutaneous Q24H  .  feeding supplement (GLUCERNA SHAKE)  237 mL Oral BID BM  . insulin aspart  0-15 Units Subcutaneous TID WC  . insulin aspart  0-5 Units Subcutaneous QHS  . insulin aspart  7 Units Subcutaneous TID WC  . insulin glargine  22 Units Subcutaneous QHS  . [START ON 03/24/2018] senna  1 tablet Oral Daily   PDJS:HFWYOVZCHYIFO**OR** acetaminophen, HYDROcodone-acetaminophen, ondansetron **OR** ondansetron (ZOFRAN) IV Anti-infectives (From admission, onward)   Start     Dose/Rate Route Frequency Ordered Stop   03/21/18 1700  vancomycin (VANCOCIN) IVPB 1000 mg/200 mL premix     1,000 mg 200 mL/hr over 60 Minutes Intravenous Every 8 hours 03/21/18 0858     03/21/18 0900  vancomycin (VANCOCIN) 1,750 mg in sodium chloride 0.9 % 500 mL IVPB     1,750 mg 250 mL/hr over 120  Minutes Intravenous  Once 03/21/18 0858 03/21/18 1142   03/20/18 1015  vancomycin (VANCOCIN) IVPB 1000 mg/200 mL premix     1,000 mg 200 mL/hr over 60 Minutes Intravenous  Once 03/20/18 1001 03/20/18 1459      Results for orders placed or performed during the Cummings encounter of 03/20/18 (from the past 48 hour(s))  Glucose, capillary     Status: Abnormal   Collection Time: 03/21/18  4:18 PM  Result Value Ref Range   Glucose-Capillary 187 (H) 70 - 99 mg/dL  Glucose, capillary     Status: Abnormal   Collection Time: 03/21/18  9:21 PM  Result Value Ref Range   Glucose-Capillary 233 (H) 70 - 99 mg/dL  CBC     Status: Abnormal   Collection Time: 03/22/18  6:03 AM  Result Value Ref Range   WBC 14.2 (H) 4.0 - 10.5 K/uL   RBC 4.61 4.22 - 5.81 MIL/uL   Hemoglobin 13.1 13.0 - 17.0 g/dL   HCT 38.9 (L) 39.0 - 52.0 %   MCV 84.4 80.0 - 100.0 fL   MCH 28.4 26.0 - 34.0 pg   MCHC 33.7 30.0 - 36.0 g/dL   RDW 11.9 11.5 - 15.5 %   Platelets 297 150 - 400 K/uL   nRBC 0.0 0.0 - 0.2 %    Comment: Performed at [facility], [place] [address], [place], [place] [address]  Basic metabolic panel     Status: Abnormal   Collection Time: 03/22/18   6:03 AM  Result Value Ref Range   Sodium 135 135 - 145 mmol/L   Potassium 3.8 3.5 - 5.1 mmol/L   Chloride 101 98 - 111 mmol/L   CO2 25 22 - 32 mmol/L   Glucose, Bld 191 (H) 70 - 99 mg/dL   BUN 8 6 - 20 mg/dL   Creatinine, Ser 0.55 (L) 0.61 - 1.24 mg/dL   Calcium 8.3 (L) 8.9 - 10.3 mg/dL   GFR calc non Af Amer >60 >60 mL/min   GFR calc Af Amer >60 >60 mL/min   Anion gap 9 5 - 15    Comment: Performed at [facility], [place] [address] [place], [place] [address]  Glucose, capillary     Status: Abnormal   Collection Time: 03/22/18  7:22 AM  Result Value Ref Range   Glucose-Capillary 171 (H) 70 - 99 mg/dL  Glucose, capillary     Status: Abnormal   Collection Time: 03/22/18 11:33 AM  Result Value Ref Range   Glucose-Capillary 181 (H) 70 - 99 mg/dL  Glucose, capillary     Status: Abnormal   Collection Time: 03/22/18  4:29 PM  Result Value Ref Range   Glucose-Capillary 190 (H) 70 - 99 mg/dL  Glucose, capillary     Status: Abnormal   Collection Time: 03/22/18  9:20 PM  Result Value Ref Range   Glucose-Capillary 236 (H) 70 - 99 mg/dL  CBC with Differential/Platelet     Status: Abnormal   Collection Time: 03/23/18  6:06 AM  Result Value Ref Range   WBC 15.6 (H) 4.0 - 10.5 K/uL   RBC 4.79 4.22 - 5.81 MIL/uL   Hemoglobin 13.5 13.0 - 17.0 g/dL   HCT 40.0 39.0 - 52.0 %   MCV 83.5 80.0 - 100.0 fL   MCH 28.2 26.0 - 34.0 pg   MCHC 33.8 30.0 - 36.0 g/dL   RDW 11.7 11.5 - 15.5 %   Platelets 341 150 - 400 K/uL  nRBC 0.0 0.0 - 0.2 %   Neutrophils Relative % 72 %   Neutro Abs 11.2 (H) 1.7 - 7.7 K/uL   Lymphocytes Relative 16 %   Lymphs Abs 2.4 0.7 - 4.0 K/uL   Monocytes Relative 6 %   Monocytes Absolute 1.0 0.1 - 1.0 K/uL   Eosinophils Relative 3 %   Eosinophils Absolute 0.5 0.0 - 0.5 K/uL   Basophils Relative 1 %   Basophils Absolute 0.1 0.0 - 0.1 K/uL   Immature Granulocytes 2 %   Abs Immature Granulocytes 0.33 (H) 0.00 - 0.07 K/uL    Comment: Performed at [facility] [address], [place], [place] [address]  Basic metabolic panel     Status: Abnormal   Collection Time: 03/23/18  6:06 AM  Result Value Ref Range   Sodium 135 135 - 145 mmol/L   Potassium 3.9 3.5 - 5.1 mmol/L   Chloride 98 98 - 111 mmol/L   CO2 28 22 - 32 mmol/L   Glucose, Bld 195 (H) 70 - 99 mg/dL   BUN 7 6 - 20 mg/dL   Creatinine, Ser 0.60 (L) 0.61 - 1.24 mg/dL   Calcium 8.4 (L) 8.9 - 10.3 mg/dL   GFR calc non Af Amer >60 >60 mL/min   GFR calc Af Amer >60 >60 mL/min   Anion gap 9 5 - 15    Comment: Performed at [facility], [place] [address] [place], [place] [address]  Glucose, capillary     Status: Abnormal   Collection Time: 03/23/18  7:27 AM  Result Value Ref Range   Glucose-Capillary 170 (H) 70 - 99 mg/dL  Glucose, capillary     Status: Abnormal   Collection Time: 03/23/18 11:32 AM  Result Value Ref Range   Glucose-Capillary 274 (H) 70 - 99 mg/dL    No results found.  Review of Systems  Constitutional: Positive for fever (last week).       Patient from [place] and was over 500 pounds.  He is down to around 240 now.  He also quit smoking about 7-8 years ago.  HENT: Negative.   Eyes: Negative.   Respiratory: Negative.   Cardiovascular: Negative.   Gastrointestinal: Negative.   Genitourinary: Negative.   Musculoskeletal: Positive for falls.  Skin:       Swelling and tenderness posterior neck started about 11 days ago  Neurological: Negative.   Psychiatric/Behavioral: Negative.    Blood pressure 115/64, pulse 70, temperature 97.6 F (36.4 C), temperature source Oral, resp. rate 18, height 6' (1.829 m), weight 112.2 kg, SpO2 96 %. Physical Exam  Constitutional: He is oriented to person, place, and time. He appears well-developed and well-nourished. No distress.  HENT:  Head: Atraumatic.  Mouth/Throat: Oropharynx is clear and moist. No oropharyngeal exudate.  Posterior neck to the base of his occipital.    Eyes: Right eye exhibits no discharge.  Left eye exhibits no discharge. No scleral icterus.  Pupils are equal  Neck: No JVD present. No tracheal deviation present. No thyromegaly present.  His next little stiff to turn. He has an abscess and swelling at the base of his occipital going into his posterior neck.  Measures about 5 x 8 cm.  There is some drainage within the hairline that has been clipped.  The area feels fluctuant.  Cardiovascular: Normal rate, regular rhythm, normal heart sounds and intact distal pulses.  No murmur heard. Respiratory: Effort normal and breath sounds normal. No respiratory distress.  He has no wheezes. He has no rales. He exhibits no tenderness.  GI: Soft. Bowel sounds are normal. He exhibits no distension. There is no abdominal tenderness. There is no rebound and no guarding.  Musculoskeletal:        General: No tenderness or edema.  Lymphadenopathy:    He has no cervical adenopathy.  Neurological: He is alert and oriented to person, place, and time. No cranial nerve deficit. Coordination normal.  Skin: Skin is warm and dry. No rash noted. He is not diaphoretic. No erythema. No pallor.  Posterior neck abscess as described above.  Picture below.  Psychiatric: He has a normal mood and affect. His behavior is normal. Judgment and thought content normal.   03/20/18 Taken by Medicine service   Picture today on Vancomycin x 3 days  Assessment/Plan: Posterior neck cellulitis with soft tissue swelling Type 2 diabetes with poor control History obstructive sleep apnea Hypertension Hyperlipidemia History of prior abscesses BMI 33.5   Plan: Patient has is large area that is fluctuant, despite what the CT suggests I think he has some fluid in there that needs to be drained.  Admission picture on 03/20/2018 shows he had a white purulent drainage; right now I see some serous fluid coming from the site.  Somewhat  Better appearing now after 3 days of vancomycin, than before.  We will plan to make him n.p.o.  after midnight taking to the OR for incision and drainage tomorrow.  He was treated with Rocephin and Flagyl during his last admission for STREP(S.AGALACTIAE)ISOLATED, & MODERATE PREVOTELLA BIVIA  from cultures at that time.  I had them do a culture of the superficial drainage tonight.  Will do better cultures tomorrow also.   Shaun Cummings 03/23/2018, 3:00 PM

## 2018-03-24 ENCOUNTER — Encounter (HOSPITAL_COMMUNITY): Payer: Self-pay | Admitting: Surgery

## 2018-03-24 ENCOUNTER — Encounter (HOSPITAL_COMMUNITY)
Admission: EM | Disposition: A | Discharge status: Home / Self Care | Payer: Self-pay | Source: Home / Self Care | Attending: Internal Medicine

## 2018-03-24 ENCOUNTER — Inpatient Hospital Stay (HOSPITAL_COMMUNITY): Payer: Self-pay | Admitting: Anesthesiology

## 2018-03-24 DIAGNOSIS — B9561 Methicillin susceptible Staphylococcus aureus infection as the cause of diseases classified elsewhere: Secondary | ICD-10-CM

## 2018-03-24 HISTORY — PX: INCISION AND DRAINAGE ABSCESS: SHX5864

## 2018-03-24 LAB — BASIC METABOLIC PANEL
Anion gap: 8 (ref 5–15)
BUN: 6 mg/dL (ref 6–20)
CHLORIDE: 98 mmol/L (ref 98–111)
CO2: 29 mmol/L (ref 22–32)
Calcium: 8.5 mg/dL — ABNORMAL LOW (ref 8.9–10.3)
Creatinine, Ser: 0.76 mg/dL (ref 0.61–1.24)
GFR calc Af Amer: >60 mL/min (ref >60)
GFR calc non Af Amer: >60 mL/min (ref >60)
GLUCOSE: 200 mg/dL — AB (ref 70–99)
Potassium: 4.3 mmol/L (ref 3.5–5.1)
Sodium: 135 mmol/L (ref 135–145)

## 2018-03-24 LAB — CBC
HEMATOCRIT: 39.9 % (ref 39.0–52.0)
Hemoglobin: 13.3 g/dL (ref 13.0–17.0)
MCH: 28.2 pg (ref 26.0–34.0)
MCHC: 33.3 g/dL (ref 30.0–36.0)
MCV: 84.7 fL (ref 80.0–100.0)
Platelets: 380 10*3/uL (ref 150–400)
RBC: 4.71 MIL/uL (ref 4.22–5.81)
RDW: 11.7 % (ref 11.5–15.5)
WBC: 14.9 10*3/uL — ABNORMAL HIGH (ref 4.0–10.5)
nRBC: 0 % (ref 0.0–0.2)

## 2018-03-24 LAB — SURGICAL PCR SCREEN
MRSA, PCR: POSITIVE — AB
Staphylococcus aureus: POSITIVE — AB

## 2018-03-24 LAB — GLUCOSE, CAPILLARY
Glucose-Capillary: 176 mg/dL — ABNORMAL HIGH (ref 70–99)
Glucose-Capillary: 166 mg/dL — ABNORMAL HIGH (ref 70–99)
Glucose-Capillary: 223 mg/dL — ABNORMAL HIGH (ref 70–99)
Glucose-Capillary: 398 mg/dL — ABNORMAL HIGH (ref 70–99)
Glucose-Capillary: 264 mg/dL — ABNORMAL HIGH (ref 70–99)

## 2018-03-24 SURGERY — INCISION AND DRAINAGE, ABSCESS
Anesthesia: General | Site: Neck

## 2018-03-24 MED ORDER — ONDANSETRON HCL 4 MG/2ML IJ SOLN
INTRAMUSCULAR | Status: AC
  Filled 2018-03-24: qty 2

## 2018-03-24 MED ORDER — ONDANSETRON HCL 4 MG/2ML IJ SOLN
INTRAMUSCULAR | Status: DC | PRN
  Administered 2018-03-24: 4 mg via INTRAVENOUS

## 2018-03-24 MED ORDER — SUGAMMADEX SODIUM 200 MG/2ML IV SOLN
INTRAVENOUS | Status: DC | PRN
  Administered 2018-03-24: 300 mg via INTRAVENOUS

## 2018-03-24 MED ORDER — FENTANYL CITRATE (PF) 100 MCG/2ML IJ SOLN: 25.0000 ug | INTRAMUSCULAR | Status: DC | PRN

## 2018-03-24 MED ORDER — LIDOCAINE 2% (20 MG/ML) 5 ML SYRINGE
INTRAMUSCULAR | Status: AC
  Filled 2018-03-24: qty 5

## 2018-03-24 MED ORDER — DEXAMETHASONE SODIUM PHOSPHATE 10 MG/ML IJ SOLN
INTRAMUSCULAR | Status: AC
  Filled 2018-03-24: qty 1

## 2018-03-24 MED ORDER — KETOROLAC TROMETHAMINE 30 MG/ML IJ SOLN
30.0000 mg | Freq: Once | INTRAMUSCULAR | Status: AC | PRN
  Administered 2018-03-24: 30 mg via INTRAVENOUS

## 2018-03-24 MED ORDER — BUPIVACAINE HCL (PF) 0.5 % IJ SOLN
INTRAMUSCULAR | Status: AC
  Filled 2018-03-24: qty 30

## 2018-03-24 MED ORDER — MUPIROCIN 2 % EX OINT
1.0000 "application " | TOPICAL_OINTMENT | Freq: Two times a day (BID) | CUTANEOUS | Status: DC
  Administered 2018-03-24 – 2018-03-25 (×3): 1 via NASAL
  Filled 2018-03-24: qty 22

## 2018-03-24 MED ORDER — PROPOFOL 10 MG/ML IV BOLUS
INTRAVENOUS | Status: DC | PRN
  Administered 2018-03-24: 200 mg via INTRAVENOUS

## 2018-03-24 MED ORDER — KETOROLAC TROMETHAMINE 30 MG/ML IJ SOLN
INTRAMUSCULAR | Status: AC
  Filled 2018-03-24: qty 1

## 2018-03-24 MED ORDER — INSULIN ASPART 100 UNIT/ML ~~LOC~~ SOLN
8.0000 [IU] | Freq: Once | SUBCUTANEOUS | Status: AC
  Administered 2018-03-24: 8 [IU] via SUBCUTANEOUS

## 2018-03-24 MED ORDER — MIDAZOLAM HCL 2 MG/2ML IJ SOLN
INTRAMUSCULAR | Status: AC
  Filled 2018-03-24: qty 2

## 2018-03-24 MED ORDER — ROCURONIUM BROMIDE 50 MG/5ML IV SOSY
PREFILLED_SYRINGE | INTRAVENOUS | Status: DC | PRN
  Administered 2018-03-24: 80 mg via INTRAVENOUS

## 2018-03-24 MED ORDER — PROMETHAZINE HCL 25 MG/ML IJ SOLN: 6.2500 mg | INTRAMUSCULAR | Status: DC | PRN

## 2018-03-24 MED ORDER — ROCURONIUM BROMIDE 50 MG/5ML IV SOSY
PREFILLED_SYRINGE | INTRAVENOUS | Status: AC
  Filled 2018-03-24: qty 10

## 2018-03-24 MED ORDER — DEXAMETHASONE SODIUM PHOSPHATE 4 MG/ML IJ SOLN
INTRAMUSCULAR | Status: DC | PRN
  Administered 2018-03-24: 10 mg via INTRAVENOUS

## 2018-03-24 MED ORDER — GLYCOPYRROLATE PF 0.2 MG/ML IJ SOSY
PREFILLED_SYRINGE | INTRAMUSCULAR | Status: AC
  Filled 2018-03-24: qty 1

## 2018-03-24 MED ORDER — 0.9 % SODIUM CHLORIDE (POUR BTL) OPTIME
TOPICAL | Status: DC | PRN
  Administered 2018-03-24: 1000 mL

## 2018-03-24 MED ORDER — POLYETHYLENE GLYCOL 3350 17 G PO PACK
17.0000 g | PACK | Freq: Every day | ORAL | Status: DC
  Administered 2018-03-24 – 2018-03-25 (×2): 17 g via ORAL
  Filled 2018-03-24 (×2): qty 1

## 2018-03-24 MED ORDER — MIDAZOLAM HCL 5 MG/5ML IJ SOLN
INTRAMUSCULAR | Status: DC | PRN
  Administered 2018-03-24: 2 mg via INTRAVENOUS

## 2018-03-24 MED ORDER — LIDOCAINE 2% (20 MG/ML) 5 ML SYRINGE
INTRAMUSCULAR | Status: DC | PRN
  Administered 2018-03-24: 100 mg via INTRAVENOUS

## 2018-03-24 MED ORDER — CHLORHEXIDINE GLUCONATE CLOTH 2 % EX PADS
6.0000 | MEDICATED_PAD | Freq: Every day | CUTANEOUS | Status: DC
  Administered 2018-03-24 – 2018-03-25 (×2): 6 via TOPICAL

## 2018-03-24 MED ORDER — GLYCOPYRROLATE PF 0.2 MG/ML IJ SOSY
PREFILLED_SYRINGE | INTRAMUSCULAR | Status: DC | PRN
  Administered 2018-03-24: .2 mg via INTRAVENOUS

## 2018-03-24 MED ORDER — PROPOFOL 10 MG/ML IV BOLUS
INTRAVENOUS | Status: AC
  Filled 2018-03-24: qty 40

## 2018-03-24 MED ORDER — HYDROCODONE-ACETAMINOPHEN 10-325 MG PO TABS
ORAL_TABLET | ORAL | Status: AC
  Filled 2018-03-24: qty 1

## 2018-03-24 MED ORDER — FENTANYL CITRATE (PF) 100 MCG/2ML IJ SOLN
INTRAMUSCULAR | Status: DC | PRN
  Administered 2018-03-24: 100 ug via INTRAVENOUS
  Administered 2018-03-24: 50 ug via INTRAVENOUS

## 2018-03-24 MED ORDER — FENTANYL CITRATE (PF) 250 MCG/5ML IJ SOLN
INTRAMUSCULAR | Status: AC
  Filled 2018-03-24: qty 5

## 2018-03-24 SURGICAL SUPPLY — 40 items
BLADE CLIPPER SURG (BLADE) IMPLANT
CANISTER SUCT 3000ML PPV (MISCELLANEOUS) ×3 IMPLANT
CHLORAPREP W/TINT 26ML (MISCELLANEOUS) IMPLANT
COVER SURGICAL LIGHT HANDLE (MISCELLANEOUS) ×6 IMPLANT
COVER WAND RF STERILE (DRAPES) ×1 IMPLANT
DRAPE LAPAROSCOPIC ABDOMINAL (DRAPES) ×3 IMPLANT
DRAPE UTILITY XL STRL (DRAPES) ×2 IMPLANT
ELECT CAUTERY BLADE 6.4 (BLADE) ×3 IMPLANT
ELECT REM PT RETURN 9FT ADLT (ELECTROSURGICAL) ×3
ELECTRODE REM PT RTRN 9FT ADLT (ELECTROSURGICAL) ×1 IMPLANT
GAUZE IODOFORM PACK 1/2 7832 (GAUZE/BANDAGES/DRESSINGS) ×2 IMPLANT
GAUZE SPONGE 4X4 12PLY STRL (GAUZE/BANDAGES/DRESSINGS) ×1 IMPLANT
GAUZE SPONGE 4X4 12PLY STRL LF (GAUZE/BANDAGES/DRESSINGS) ×2 IMPLANT
GLOVE BIOGEL PI IND STRL 8 (GLOVE) ×1 IMPLANT
GLOVE BIOGEL PI INDICATOR 8 (GLOVE) ×2
GLOVE SURG SS PI 7.5 STRL IVOR (GLOVE) ×3 IMPLANT
GOWN STRL REUS W/ TWL LRG LVL3 (GOWN DISPOSABLE) ×1 IMPLANT
GOWN STRL REUS W/ TWL XL LVL3 (GOWN DISPOSABLE) ×1 IMPLANT
GOWN STRL REUS W/TWL LRG LVL3 (GOWN DISPOSABLE) ×3
GOWN STRL REUS W/TWL XL LVL3 (GOWN DISPOSABLE) ×3
KIT BASIN OR (CUSTOM PROCEDURE TRAY) ×3 IMPLANT
KIT TURNOVER KIT B (KITS) ×3 IMPLANT
NDL HYPO 25GX1X1/2 BEV (NEEDLE) IMPLANT
NEEDLE HYPO 25GX1X1/2 BEV (NEEDLE) ×3 IMPLANT
NS IRRIG 1000ML POUR BTL (IV SOLUTION) ×3 IMPLANT
PACK SURGICAL SETUP 50X90 (CUSTOM PROCEDURE TRAY) ×3 IMPLANT
PAD ARMBOARD 7.5X6 YLW CONV (MISCELLANEOUS) ×6 IMPLANT
PENCIL BUTTON HOLSTER BLD 10FT (ELECTRODE) ×1 IMPLANT
PENCIL SMOKE EVAC W/HOLSTER (ELECTROSURGICAL) ×2 IMPLANT
SPONGE LAP 18X18 RF (DISPOSABLE) ×3 IMPLANT
SWAB COLLECTION DEVICE MRSA (MISCELLANEOUS) ×2 IMPLANT
SWAB CULTURE ESWAB REG 1ML (MISCELLANEOUS) ×2 IMPLANT
SYR BULB IRRIGATION 50ML (SYRINGE) ×3 IMPLANT
SYR CONTROL 10ML LL (SYRINGE) ×2 IMPLANT
TAPE CLOTH SURG 6X10 WHT LF (GAUZE/BANDAGES/DRESSINGS) ×2 IMPLANT
TOWEL OR 17X24 6PK STRL BLUE (TOWEL DISPOSABLE) ×3 IMPLANT
TOWEL OR 17X26 10 PK STRL BLUE (TOWEL DISPOSABLE) ×3 IMPLANT
TUBE CONNECTING 12'X1/4 (SUCTIONS) ×1
TUBE CONNECTING 12X1/4 (SUCTIONS) ×2 IMPLANT
YANKAUER SUCT BULB TIP NO VENT (SUCTIONS) ×3 IMPLANT

## 2018-03-24 NOTE — Progress Notes (Signed)
Pharmacy Antibiotic Note  Shaun Cummings is a 43 y.o. male admitted on 03/20/2018 with cellulitis not responding to PO doxycycline.  Pharmacy has been consulted for Vancomycin dosing, day #5.  S/p I&D this morning. Abscess culture with moderate Staph aureus, sensitivities pending.  Plan:  Continue Vancomycin 1 gm IV q8hrs.  Follow up Staph sensitivities.  Will plan to check Vanc levels over the weekend if Vanc to continue.  Follow renal function.  Height: 6' (182.9 cm) Weight: 247 lb 5.7 oz (112.2 kg) IBW/kg (Calculated) : 77.6  Temp (24hrs), Avg:98.1 F (36.7 C), Min:97.7 F (36.5 C), Max:98.8 F (37.1 C)  Recent Labs  Lab 03/20/18 1015 03/21/18 0341 03/22/18 0603 03/23/18 0606 03/24/18 0410  WBC 17.6* 17.3* 14.2* 15.6* 14.9*  CREATININE 0.71 0.72 0.55* 0.60* 0.76    Estimated Creatinine Clearance: 158.7 mL/min (by C-G formula based on SCr of 0.76 mg/dL).    Allergies  Allergen Reactions  . Augmentin [Amoxicillin-Pot Clavulanate] Hives and Itching    Has patient had a PCN reaction causing immediate rash, facial/tongue/throat swelling, SOB or lightheadedness with hypotension: Yes Has patient had a PCN reaction causing severe rash involving mucus membranes or skin necrosis: No Has patient had a PCN reaction that required hospitalization: No Has patient had a PCN reaction occurring within the last 10 years: No If all of the above answers are "NO", then may proceed with Cephalosporin use.  . Contrast Media [Iodinated Diagnostic Agents] Hives    Pt broke out with 1 hive on forehead after CT injection-treated with 25mg  Benadryl    Antimicrobials this admission:   Vancomycin 2/23>>  Dose adjustments this admission:  n/a  Microbiology results:   2/23: Blood x 1 - no growth x 4 days to date  2/27 neck abscess -  moderate Staph aureus, sensitivities pending  2/27 MRSA PCR positive - CHG/Mupirocin x 5 days added  Thank you for allowing pharmacy to be a part of this patient's  care.  Arty Baumgartner, [place] Pager: [phone] or phone: [phone] 03/24/2018 3:56 PM

## 2018-03-24 NOTE — Op Note (Signed)
Preoperative Diagnosis: Complex posterior neck abscess  Postoprative Diagnosis: Same  Procedure: Procedure(s): INCISION DRAINAGE AND DEBRIDEMENT OF COMPLEX POSTERIOR NECK ABSCESS   Surgeon: Excell Seltzer T   Assistants: None  Anesthesia:  General endotracheal anesthesia  Indications: Patient is a 43 year old male with poorly controlled diabetes who presented several days ago with a large area of induration and erythema of his posterior midline neck just below the hairline.  Initial imaging indicated cellulitis without abscess.  He has been treated with appropriate IV antibiotics for several days with out improvement.  On examination there is some purulent drainage and apparent fluctuance from a large, approximately 8 x 8 cm area of swelling and erythema from his posterior neck.  I recommended incision and drainage in the operating room.  We discussed the indications and nature of surgery, open wound and healing by secondary intention, risks of bleeding, infection and anesthetic risks.  He understands and agrees to proceed.    Procedure Detail: Patient was brought to the operating room, placed in the supine position on the operating table, and general endotracheal anesthesia induced.  He was carefully positioned in the right lateral decubitus position padded on a beanbag.  PAS were in place.  He was already on appropriate IV antibiotics.  The posterior neck was widely sterilely prepped and draped.  Patient timeout was performed and correct procedure verified. I initially elliptically excised an area of skin and subcutaneous tissue containing a draining sinus tract with purulence.  I entered a good-sized abscess cavity in the very deep subcutaneous tissue.  There was some necrotic subcutaneous tissue and fascia.  Pressure on surrounding areas expressed a large amount of pus from the soft tissue surrounding this incision.  There appeared to be tracking in the tissue layers with some necrotic  tissue that extended deeply down onto the deep fascia of the neck and close to the galea superiorly.  In order to expose and drain this area I excised further skin and deep subcutaneous tissue and fascia creating a wound 4 x 4 x 3 cm which was excision of this tissue using cautery dissection.  Further loculations were broken up and the deep soft tissue in all directions.  At this point the area appeared well excised and drained.  Soft tissue was infiltrated with Marcaine.  The wound was thoroughly irrigated with saline.  The wound was packed open with 1/2 inch iodoform gauze.  Sponge needle and instrument counts were correct.  Dry sterile dressing was applied.    Findings: Complex abscess and necrotic tissue as above  Estimated Blood Loss:  less than 50 mL         Drains: Wound packed open with 1/2 inch iodoform gauze  Blood Given: none          Specimens: Culture and sensitivity        Complications:  * No complications entered in OR log *         Disposition: PACU - hemodynamically stable.         Condition: stable

## 2018-03-24 NOTE — Progress Notes (Addendum)
   Subjective: Shaun Cummings was seen and evaluated after I&D surgery.  He mentions that he is feeling much better.  Says that only needed to pain medication after surgery, and his pain has improved.  Denies any problem.  Objective:  Vital signs in last 24 hours: Vitals:   03/23/18 0901 03/23/18 1740 03/23/18 2003 03/24/18 0506  BP: 115/64 (!) 151/76 128/72 124/78  Pulse: 70 70 76 65  Resp: 18 18 18 20   Temp: 97.6 F (36.4 C) 98.8 F (37.1 C) 98.1 F (36.7 C) 98.2 F (36.8 C)  TempSrc: Oral Oral Oral Oral  SpO2: 96% 99% 96% 98%  Weight:      Height:       General: Well-developed well-nourished.  In no acute distress Head: Surgery site dressing is clean. CV: RRR, normal S1-S2 Lungs: CTA bilaterally, no wheeze no rale Abdomen: is soft and nontender, BS are present Extremities: Pulses are normal bilaterally, no lower extremity edema Neurologic exam: Patient is alert and oriented x3 Psychiatric exam: Has normal mood and affect and behavior  Assessment/Plan:  Active Problems:   Cellulitis   Malnutrition of moderate degree  43 year old gentleman with insulin-dependent type 2 diabetes, presented with worsening of skin infectionon back of his head, fever, headache,despite taking p.o. doxycycline.Cellulitis w/o abscess:Patient was seen in IM clinic and ED due to the same problem since a week ago. Despite p.o. antibiotic, his symptoms worse and he developed further pain on the area, headache, pressure behind his eyes and fever and chills.  Cellulitis around papule/pustule w/odraining abscess: Likely 2/2 DM Patient with leukocytosis on arrival, CT scan showed cellulitis withoutabscess.  He had worsening of his symptoms despite taking p.o. doxycycline that prescribed about 10 days ago clinic.  HE has Hx of diabetis that probably contribute to worsening of infection.  Patient underwent I and D surgery today. Culture sent. He feels much better after surgery and drainage. We will  continue IV antibiotic for now, and may switch based on culture result.  -ContinueIV Vancomycinper pharmacy -CBCdaily -CMPdaily -Tylenol 650 mg every 6 hours PRN -Norco every 4 hours PRN  -F/u BC -F/u abscess fluid culture continue LR 100 ml/h  Insulin dependant DM II: CBG at 398 this PM.  -Giving one dose of 8 u Novolog -Continue Mod SSI -Continue Lantus to 22 u bedtime -ContinueNovolog to 7 u TID -CBG monitor  Diet:Carb modified IV fluid:None VTE HYQ:MVHQION Code status:full   Dispo: Anticipated discharge in approximately 2-3 days  Shaun Hatch, MD 03/24/2018, 5:36 AM Pager: [phone]

## 2018-03-24 NOTE — Anesthesia Postprocedure Evaluation (Signed)
Anesthesia Post Note  Patient: Shaun Cummings  Procedure(s) Performed: INCISION AND DRAINAGE POSTERIOR NECK ABSCESS (N/A Neck)     Patient location during evaluation: PACU Anesthesia Type: General Level of consciousness: awake and alert Pain management: pain level controlled Vital Signs Assessment: post-procedure vital signs reviewed and stable Respiratory status: spontaneous breathing, nonlabored ventilation, respiratory function stable and patient connected to nasal cannula oxygen Cardiovascular status: blood pressure returned to baseline and stable Postop Assessment: no apparent nausea or vomiting Anesthetic complications: no    Last Vitals:  Vitals:   03/24/18 0915 03/24/18 0930  BP: 118/72 118/71  Pulse: 66 66  Resp: 19 12  Temp:    SpO2: 93% 94%    Last Pain:  Vitals:   03/24/18 0930  TempSrc:   PainSc: 0-No pain                 Stella Encarnacion S

## 2018-03-24 NOTE — Anesthesia Procedure Notes (Signed)
Procedure Name: Intubation Date/Time: 03/24/2018 8:09 AM Performed by: Lieutenant Diego, CRNA Pre-anesthesia Checklist: Patient identified, Emergency Drugs available, Suction available and Patient being monitored Patient Re-evaluated:Patient Re-evaluated prior to induction Oxygen Delivery Method: Circle system utilized Preoxygenation: Pre-oxygenation with 100% oxygen Induction Type: IV induction Ventilation: Mask ventilation without difficulty Laryngoscope Size: Miller and 2 Grade View: Grade I Tube type: Oral Tube size: 8.0 mm Number of attempts: 1 Airway Equipment and Method: Stylet Placement Confirmation: ETT inserted through vocal cords under direct vision,  positive ETCO2 and breath sounds checked- equal and bilateral Secured at: 22 cm Tube secured with: Tape Dental Injury: Teeth and Oropharynx as per pre-operative assessment

## 2018-03-24 NOTE — Anesthesia Preprocedure Evaluation (Signed)
Anesthesia Evaluation  Patient identified by MRN, date of birth, ID band Patient awake    Reviewed: Allergy & Precautions, NPO status , Patient's Chart, lab work & pertinent test results  Airway Mallampati: II  TM Distance: >3 FB Neck ROM: Full    Dental no notable dental hx.    Pulmonary sleep apnea , former smoker,    Pulmonary exam normal breath sounds clear to auscultation       Cardiovascular negative cardio ROS Normal cardiovascular exam Rhythm:Regular Rate:Normal     Neuro/Psych negative neurological ROS  negative psych ROS   GI/Hepatic negative GI ROS, Neg liver ROS,   Endo/Other  diabetes  Renal/GU negative Renal ROS  negative genitourinary   Musculoskeletal negative musculoskeletal ROS (+)   Abdominal   Peds negative pediatric ROS (+)  Hematology negative hematology ROS (+)   Anesthesia Other Findings   Reproductive/Obstetrics negative OB ROS                             Anesthesia Physical Anesthesia Plan  ASA: III  Anesthesia Plan: General   Post-op Pain Management:    Induction: Intravenous  PONV Risk Score and Plan: 2 and Ondansetron and Treatment may vary due to age or medical condition  Airway Management Planned: Oral ETT  Additional Equipment:   Intra-op Plan:   Post-operative Plan: Extubation in OR  Informed Consent: I have reviewed the patients History and Physical, chart, labs and discussed the procedure including the risks, benefits and alternatives for the proposed anesthesia with the patient or authorized representative who has indicated his/her understanding and acceptance.     Dental advisory given  Plan Discussed with: CRNA and Surgeon  Anesthesia Plan Comments:         Anesthesia Quick Evaluation

## 2018-03-24 NOTE — Transfer of Care (Signed)
Immediate Anesthesia Transfer of Care Note  Patient: Shaun Cummings  Procedure(s) Performed: INCISION AND DRAINAGE POSTERIOR NECK ABSCESS (N/A Neck)  Patient Location: PACU  Anesthesia Type:General  Level of Consciousness: awake  Airway & Oxygen Therapy: Patient Spontanous Breathing and Patient connected to face mask oxygen  Post-op Assessment: Report given to RN and Post -op Vital signs reviewed and stable  Post vital signs: Reviewed and stable  Last Vitals:  Vitals Value Taken Time  BP 145/85 03/24/2018  9:00 AM  Temp    Pulse 69 03/24/2018  8:59 AM  Resp 19 03/24/2018  8:59 AM  SpO2 100 % 03/24/2018  8:59 AM  Vitals shown include unvalidated device data.  Last Pain:  Vitals:   03/24/18 0506  TempSrc: Oral  PainSc:       Patients Stated Pain Goal: 0 (96/29/52 8413)  Complications: No apparent anesthesia complications

## 2018-03-25 ENCOUNTER — Encounter (HOSPITAL_COMMUNITY): Payer: Self-pay | Admitting: General Surgery

## 2018-03-25 LAB — GLUCOSE, CAPILLARY
GLUCOSE-CAPILLARY: 208 mg/dL — AB (ref 70–99)
Glucose-Capillary: 276 mg/dL — ABNORMAL HIGH (ref 70–99)

## 2018-03-25 LAB — AEROBIC CULTURE W GRAM STAIN (SUPERFICIAL SPECIMEN)

## 2018-03-25 LAB — BASIC METABOLIC PANEL
Anion gap: 9 (ref 5–15)
BUN: 12 mg/dL (ref 6–20)
CO2: 28 mmol/L (ref 22–32)
CREATININE: 0.72 mg/dL (ref 0.61–1.24)
Calcium: 8.6 mg/dL — ABNORMAL LOW (ref 8.9–10.3)
Chloride: 97 mmol/L — ABNORMAL LOW (ref 98–111)
GFR calc Af Amer: >60 mL/min (ref >60)
Glucose, Bld: 348 mg/dL — ABNORMAL HIGH (ref 70–99)
Potassium: 3.9 mmol/L (ref 3.5–5.1)
Sodium: 134 mmol/L — ABNORMAL LOW (ref 135–145)

## 2018-03-25 LAB — CBC
HCT: 38.6 % — ABNORMAL LOW (ref 39.0–52.0)
Hemoglobin: 12.7 g/dL — ABNORMAL LOW (ref 13.0–17.0)
MCH: 28.1 pg (ref 26.0–34.0)
MCHC: 32.9 g/dL (ref 30.0–36.0)
MCV: 85.4 fL (ref 80.0–100.0)
Platelets: 393 10*3/uL (ref 150–400)
RBC: 4.52 MIL/uL (ref 4.22–5.81)
RDW: 11.9 % (ref 11.5–15.5)
WBC: 12.3 10*3/uL — AB (ref 4.0–10.5)
nRBC: 0 % (ref 0.0–0.2)

## 2018-03-25 LAB — CULTURE, BLOOD (ROUTINE X 2)
Culture: NO GROWTH
SPECIAL REQUESTS: ADEQUATE

## 2018-03-25 MED ORDER — INSULIN ASPART 100 UNIT/ML FLEXPEN: 9.0000 [IU] | PEN_INJECTOR | Freq: Three times a day (TID) | SUBCUTANEOUS | 0 refills | Status: DC

## 2018-03-25 MED ORDER — SULFAMETHOXAZOLE-TRIMETHOPRIM 800-160 MG PO TABS: 2.0000 | ORAL_TABLET | Freq: Two times a day (BID) | ORAL | 0 refills | Status: DC

## 2018-03-25 MED ORDER — PEN NEEDLES 32G X 4 MM MISC: 1.0000 | Freq: Every day | 0 refills | Status: DC

## 2018-03-25 MED FILL — NOVOLOG FLEXPEN SYRINGE: 100 | 30 days supply | Qty: 9 | Fill #0

## 2018-03-25 MED FILL — SULFAMETHOXAZOLE-TMP DS TAB: 800-160 | 7 days supply | Qty: 28 | Fill #0

## 2018-03-25 NOTE — Progress Notes (Signed)
Shaun Cummings to be D/C'd Home per MD order.  Discussed prescriptions and follow up appointments with the patient. Prescriptions given to patient, medication list explained in detail. Pt verbalized understanding.  Allergies as of 03/25/2018      Reactions   Augmentin [amoxicillin-pot Clavulanate] Hives, Itching   Has patient had a PCN reaction causing immediate rash, facial/tongue/throat swelling, SOB or lightheadedness with hypotension: Yes Has patient had a PCN reaction causing severe rash involving mucus membranes or skin necrosis: No Has patient had a PCN reaction that required hospitalization: No Has patient had a PCN reaction occurring within the last 10 years: No If all of the above answers are "NO", then may proceed with Cephalosporin use.   Contrast Media [iodinated Diagnostic Agents] Hives   Pt broke out with 1 hive on forehead after CT injection-treated with 50m Benadryl      Medication List    STOP taking these medications   doxycycline 100 MG tablet Commonly known as:  VIBRA-TABS     TAKE these medications   CONTOUR NEXT MONITOR w/Device Kit 30 Units by Does not apply route at bedtime.   gabapentin 300 MG capsule Commonly known as:  NEURONTIN Take 1 capsule (300 mg total) by mouth at bedtime as needed.   glucose blood test strip Commonly known as:  CONTOUR NEXT TEST Use as instructed   insulin aspart 100 UNIT/ML FlexPen Commonly known as:  NOVOLOG Inject 9 Units into the skin 3 (three) times daily with meals.   Insulin Glargine 100 UNIT/ML Solostar Pen Commonly known as:  LANTUS SOLOSTAR Inject 30 Units into the skin daily at 10 pm.   metFORMIN 500 MG 24 hr tablet Commonly known as:  GLUCOPHAGE-XR Take 1 tablet (500 mg total) by mouth daily with breakfast.   methocarbamol 500 MG tablet Commonly known as:  ROBAXIN Take 1 tablet (500 mg total) by mouth 2 (two) times daily.   Pen Needles 32G X 4 MM Misc 1 Bag by Does not apply route daily.    sulfamethoxazole-trimethoprim 800-160 MG tablet Commonly known as:  BACTRIM DS,SEPTRA DS Take 2 tablets by mouth 2 (two) times daily.       Vitals:   03/25/18 0445 03/25/18 1011  BP: 111/75 (!) 141/90  Pulse: 62 72  Resp: 17 19  Temp: 97.8 F (36.6 C) 97.6 F (36.4 C)  SpO2: 97% 97%    IV catheter discontinued intact. Site without signs and symptoms of complications. Dressing and pressure applied. Pt denies pain at this time. No complaints noted.  An After Visit Summary was printed and given to the patient. Patient escorted and D/C home via private auto.  COrville Govern RN

## 2018-03-25 NOTE — Progress Notes (Signed)
[facility]/Trauma Progress Note  1 Day Post-Op   Assessment/Plan  Type 2 diabetes with poor control History OSA Hypertension Hyperlipidemia History of prior abscesses BMI 33.5  Posterior neck cellulitis/abscess - S/P I&D, 02/27, Dr. Excell Seltzer - wet to dry dressing changes - PO abx at discharge, recommend 1 week, cultures show staph aureus, sensitivities pending  FEN: card mod VTE: SCD's, lovenox ID: Vanc 02/23>> Follow up: [facility] 2 weeks  DISPO: okay for discharge from a surgical standpoint. Would cover with bactrim until sensitivities return then can change abx if needed. Pt states wife will change dressing for him.     LOS: 5 days    Subjective: CC: posterior neck abscess  Pain improved and no issues overnight. Pt states he ran out of his diabetes medication and then he got this infection. He has an orange card with [facility].   Objective: Vital signs in last 24 hours: Temp:  [97.8 F (36.6 C)-98.3 F (36.8 C)] 97.8 F (36.6 C) (02/28 0445) Pulse Rate:  [62-73] 62 (02/28 0445) Resp:  [12-20] 17 (02/28 0445) BP: (111-132)/(61-82) 111/75 (02/28 0445) SpO2:  [93 %-97 %] 97 % (02/28 0445) Weight:  [112.1 kg] 112.1 kg (02/27 2154) Last BM Date: 03/17/18  Intake/Output from previous day: 02/27 0701 - 02/28 0700 In: 1654 [P.O.:240; I.V.:1014.2; IV Piggyback:399.8] Out: 10 [Blood:10] Intake/Output this shift: No intake/output data recorded.  PE: Gen:  Alert, NAD, pleasant, cooperative Neck: posterior wound unpacked. Wound without purulent drainage, repacked loosely. Continued surrounding erythema and induration.  Pulm: rate and effort normal Skin: no rashes noted, warm and dry   Anti-infectives: Anti-infectives (From admission, onward)   Start     Dose/Rate Route Frequency Ordered Stop   03/21/18 1700  vancomycin (VANCOCIN) IVPB 1000 mg/200 mL premix     1,000 mg 200 mL/hr over 60 Minutes Intravenous Every 8  hours 03/21/18 0858     03/21/18 0900  vancomycin (VANCOCIN) 1,750 mg in sodium chloride 0.9 % 500 mL IVPB     1,750 mg 250 mL/hr over 120 Minutes Intravenous  Once 03/21/18 0858 03/21/18 1142   03/20/18 1015  vancomycin (VANCOCIN) IVPB 1000 mg/200 mL premix     1,000 mg 200 mL/hr over 60 Minutes Intravenous  Once 03/20/18 1001 03/20/18 1459      Lab Results:  Recent Labs    03/24/18 0410 03/25/18 0854  WBC 14.9* 12.3*  HGB 13.3 12.7*  HCT 39.9 38.6*  PLT 380 393   BMET Recent Labs    03/23/18 0606 03/24/18 0410  NA 135 135  K 3.9 4.3  CL 98 98  CO2 28 29  GLUCOSE 195* 200*  BUN 7 6  CREATININE 0.60* 0.76  CALCIUM 8.4* 8.5*   PT/INR No results for input(s): LABPROT, INR in the last 72 hours. CMP     Component Value Date/Time   NA 135 03/24/2018 0410   K 4.3 03/24/2018 0410   CL 98 03/24/2018 0410   CO2 29 03/24/2018 0410   GLUCOSE 200 (H) 03/24/2018 0410   BUN 6 03/24/2018 0410   CREATININE 0.76 03/24/2018 0410   CREATININE 0.91 09/15/2013 1727   CALCIUM 8.5 (L) 03/24/2018 0410   PROT 7.4 03/21/2018 0341   ALBUMIN 2.5 (L) 03/21/2018 0341   AST 13 (L) 03/21/2018 0341   ALT 24 03/21/2018 0341   ALKPHOS 98 03/21/2018 0341   BILITOT 0.7 03/21/2018 0341   GFRNONAA >60 03/24/2018 0410   GFRAA >60 03/24/2018  0410   Lipase  No results found for: LIPASE  Studies/Results: No results found.    Kalman Drape , [facility] 03/25/2018, 9:31 AM  Pager: [phone] Mon-Wed, Friday 7:00am-4:30pm Thurs 7am-11:30am  Consults: [phone]

## 2018-03-25 NOTE — Care Management Note (Addendum)
Case Management Note  Patient Details  Name: Antiono Ettinger MRN: [id number] Date of Birth: [birth year]  Subjective/Objective:   Pt admitted with neck abscess                  Action/Plan:  PTA independent from alone.  Pt is active with IM clinic - pt has instructions to follow up with his PCP on Monday.   Pt informed CM that he was unable to afford discharge medications - MATCH provided.  TOC will deliver meds to bed prior to discharge.  NO other CM needs identified prior to discharge   Expected Discharge Date:  03/25/18               Expected Discharge Plan:  Home/Self Care  In-House Referral:     Discharge planning Services  CM Consult, [facility] Program  Post Acute Care Choice:    Choice offered to:     DME Arranged:    DME Agency:     HH Arranged:    HH Agency:     Status of Service:  Completed, signed off  If discussed at [employer], dates discussed:    Additional Comments:  Maryclare Labrador, RN 03/25/2018, 3:21 PM

## 2018-03-25 NOTE — Discharge Instructions (Signed)
WOUND CARE: - dressing to be changed twice daily - supplies: sterile saline, kerlix, scissors, ABD pads, tape  - remove dressing and all packing carefully, moistening with sterile saline as needed to avoid packing/internal dressing sticking to the wound. - clean edges of skin around the wound with water/gauze, making sure there is no tape debris or leakage left on skin that could cause skin irritation or breakdown. - dampen and clean kerlix with sterile saline and pack wound from wound base to skin level, making sure to take note of any possible areas of wound tracking, tunneling and packing appropriately. Wound can be packed loosely. Trim kerlix to size if a whole kerlix is not required. - cover wound with a dry ABD pad and secure with tape.  - write the date/time on the dry dressing/tape to better track when the last dressing change occurred. - apply any skin protectant/powder recommended by clinician to protect skin/skin folds. - change dressing as needed if leakage occurs, wound gets contaminated, or patient requests to shower. - patient may shower daily with wound open and following the shower the wound should be dried and a clean dressing placed.    1. PAIN CONTROL:  1. Pain is best controlled by a usual combination of three different methods TOGETHER:  i. Ice/Heat ii. Over the counter pain medication iii. Prescription pain medication 2. You may experience some swelling and bruising in area of wound. Ice packs or heating pads (30-60 minutes up to 6 times a day) will help. Use ice for the first few days to help decrease swelling and bruising, then switch to heat to help relax tight/sore spots and speed recovery. Some people prefer to use ice alone, heat alone, alternating between ice & heat. Experiment to what works for you. Swelling and bruising can take several weeks to resolve.  3. It is helpful to take an over-the-counter pain medication regularly for the first few weeks. Choose one of  the following that works best for you:  i. Naproxen (Aleve, etc) Two 220mg  tabs twice a day ii. Ibuprofen (Advil, etc) Three 200mg  tabs four times a day (every meal & bedtime) iii. Acetaminophen (Tylenol, etc) 500-650mg  four times a day (every meal & bedtime) 4. A prescription for pain medication (such as oxycodone, hydrocodone, etc) may be given to you upon discharge. Take your pain medication as prescribed.  i. If you are having problems/concerns with the prescription medicine (does not control pain, nausea, vomiting, rash, itching, etc), please call us [phone] to see if we need to switch you to a different pain medicine that will work better for you and/or control your side effect better. ii. If you need a refill on your pain medication, please contact your pharmacy. They will contact our office to request authorization. Prescriptions will not be filled after 5 pm or on week-ends. 1. Avoid getting constipated. When taking pain medications, it is common to experience some constipation. Increasing fluid intake and taking a fiber supplement (such as Metamucil, Citrucel, FiberCon, MiraLax, etc) 1-2 times a day regularly will usually help prevent this problem from occurring. A mild laxative (prune juice, Milk of Magnesia, MiraLax, etc) should be taken according to package directions if there are no bowel movements after 48 hours.  2. Watch out for diarrhea. If you have many loose bowel movements, simplify your diet to bland foods & liquids for a few days. Stop any stool softeners and decrease your fiber supplement. Switching to mild anti-diarrheal medications (Kayopectate, Pepto Bismol) can help.  If this worsens or does not improve, please call us. 3. Shower daily but do not bathe until your wound heals.  4. FOLLOW UP  a. If a follow up appointment is needed one will be scheduled for you.   WHEN TO CALL us [phone]:  1. Poor pain control 2. Reactions / problems with new medications  (rash/itching, nausea, etc)  3. Fever over 101.5 F (38.5 C) 4. Worsening swelling or bruising 5. Redness, swelling, foul discharge or increased pain from wound 6. Productive cough, difficulty breathing or any other concerning symptoms  The clinic staff is available to answer your questions during regular business hours (8:30am-5pm). Please dont hesitate to call and ask to speak to one of our nurses for clinical concerns.  If you have a medical emergency, go to the nearest emergency room or call 911.  A surgeon from [facility] is always on call at the [facility], [place], [place], [place],  [address] ?  MAIN: [phone] ? TOLL FREE: [phone] ?  FAX [phone]  [email or web address]

## 2018-03-25 NOTE — Progress Notes (Signed)
   Subjective: Patient was seen and evaluated at bedside on morning rounds. No acute events overnight. Pain is improved. He asks when he can go home.  The plan of care discussed with him.  Questions were answered.  Objective:  Vital signs in last 24 hours: Vitals:   03/24/18 1000 03/24/18 1028 03/24/18 2154 03/25/18 0445  BP: 117/61 132/82 124/70 111/75  Pulse: 65 69 73 62  Resp: 12 20 19 17   Temp: 97.8 F (36.6 C) 98.2 F (36.8 C) 98.3 F (36.8 C) 97.8 F (36.6 C)  TempSrc:  Oral Oral Oral  SpO2: 94% 95% 95% 97%  Weight:   112.1 kg   Height:       General: Well-developed well-nourished.  In no acute distress Head: Surgery site dressing is clean. CV: RRR, normal S1-S2 Lungs: CTA bilaterally, no wheeze no rale Abdomen: is soft and nontender, BS are present Extremities: Pulses are normal bilaterally, no lower extremity edema Neurologic exam: Patient is alert and oriented x3 Psychiatric exam: Has normal mood and affect and behavior  Assessment/Plan:  Active Problems:   Cellulitis   Malnutrition of moderate degree  43 year old gentleman with insulin-dependent type 2 diabetes, presented with worsening of skin infectionon back of his head, fever, headache,despite taking p.o. doxycycline.Cellulitis w/o abscess:Patient was seen in IM clinic and ED due to the same problem since a week ago. Despite p.o. antibiotic, his symptoms worse and he developed further pain on the area, headache, pressure behind his eyes and fever and chills.  Cellulitis around papule/pustule w/odraining abscess: Likely 2/2 DM Patient with leukocytosis on arrival, CT scan showed cellulitis withoutabscess.He had worsening of his symptoms despite taking p.o. doxycycline that prescribed about 10 daysago clinic. HE has Hx of diabetis that probably contribute to worsening of infection.  Patient underwent I and D surgery today. Culture sent but pending. He feels much better after surgery and  drainage.  We will wait for surgery recommendation today.  May be able to discharge today or tomorrow p.o. antibiotic and may switch based on culture result.  -ContinueIV Vancomycinper pharmacy, will switch to p.o. antibiotic at discharge -CBCdaily -CMPdaily -Tylenol 650 mg every 6 hours PRN -Norco every 4 hours PRN  -F/u BC -F/u abscess fluid culture   Insulin dependant DM II: CBG at 398 this PM. gave one dose of 8 u Novolog last night.  high blood glucose was likely due to infection and will hopefully get better as  surgery performed. Will monitor,   -ContinueMod SSI -Continue Lantusto 22u bedtime -ContinueNovologto 7u TID -CBG monitor -Will need follow-up with primary care after discharge for blood glucose control  Diet:Carb modified IV fluid:None VTE EGB:TDVVOHY Code status:full   Dispo: Anticipated discharge today or tomorrow.  Dewayne Hatch, MD 03/25/2018, 6:09 AM Pager: [phone]

## 2018-03-25 NOTE — Discharge Summary (Signed)
Name: Shaun Cummings MRN: [id number] DOB: October 30, 1975 43 y.o. PCP: Shaun Ludwig, MD  Date of Admission: 03/20/2018  9:37 AM Date of Discharge: 03/25/2018 Attending Physician: No att. providers found  Discharge Diagnosis: 1. Active Problems:   Cellulitis   Malnutrition of moderate degree    Discharge Medications: Allergies as of 03/25/2018      Reactions   Augmentin [amoxicillin-pot Clavulanate] Hives, Itching   Has patient had a PCN reaction causing immediate rash, facial/tongue/throat swelling, SOB or lightheadedness with hypotension: Yes Has patient had a PCN reaction causing severe rash involving mucus membranes or skin necrosis: No Has patient had a PCN reaction that required hospitalization: No Has patient had a PCN reaction occurring within the last 10 years: No If all of the above answers are "NO", then may proceed with Cephalosporin use.   Contrast Media [iodinated Diagnostic Agents] Hives   Pt broke out with 1 hive on forehead after CT injection-treated with 64m Benadryl      Medication List    STOP taking these medications   doxycycline 100 MG tablet Commonly known as:  VIBRA-TABS     TAKE these medications   Contour Next Monitor w/Device Kit 30 Units by Does not apply route at bedtime.   gabapentin 300 MG capsule Commonly known as:  NEURONTIN Take 1 capsule (300 mg total) by mouth at bedtime as needed.   glucose blood test strip Commonly known as:  Contour Next Test Use as instructed   insulin aspart 100 UNIT/ML FlexPen Commonly known as:  NOVOLOG Inject 9 Units into the skin 3 (three) times daily with meals.   Insulin Glargine 100 UNIT/ML Solostar Pen Commonly known as:  Lantus SoloStar Inject 30 Units into the skin daily at 10 pm.   metFORMIN 500 MG 24 hr tablet Commonly known as:  GLUCOPHAGE-XR Take 1 tablet (500 mg total) by mouth daily with breakfast.   methocarbamol 500 MG tablet Commonly known as:  ROBAXIN Take 1 tablet (500 mg  total) by mouth 2 (two) times daily.   Pen Needles 32G X 4 MM Misc 1 Bag by Does not apply route daily.   sulfamethoxazole-trimethoprim 800-160 MG tablet Commonly known as:  BACTRIM DS,SEPTRA DS Take 2 tablets by mouth 2 (two) times daily.       Disposition and follow-up:   [facility]  At the hospital follow up visit please address:  1. Patient admited for cellulitis/abscess at posterir area of his scall. Underwent I & D surgery on 03/24/2018. Discharging with Bactrim x 10 days. May switch antibiotic based on specimen culture result. Please reevaluate for signs if reinfection and f/u result.  2.  Labs / imaging needed at time of follow-up: CBG, BMP, WBC. Please reevaluate abscess area at post scall  3.  Pending labs/ test needing follow-up: Abscess specimen culture  Follow-up Appointments: Follow-up Information    HKathi Ludwig MD Follow up on 03/28/2018.   Specialty:  Internal Medicine Why:  Please follow up at your PCP's office on Monday Contact information: [id number]        [facility] [place] on 04/05/2018.   Specialty:  General Surgery Why:  at 1:30pm. Please arrive 15 min early to complete paperwork. please bring photo ID and insurance card.  Contact information: [id number]Course by  problem list: 1. Cellulitis of posterior head: 43 year old gentleman with insulin-dependent type 2 diabetes, presented with worsening of skin infectionon back of his head, fever, headache,despite taking p.o. doxycycline that prescribed for him in clinic. Had fever and leukocytosis on arrival. CT scan and POC ultrasound did not show drainable abscess. He was started on IV Vancomycin. BC remained negative. Although leukocytosis improved, no major improvement in pain  and exam after few days.  On third day of admission, exam was suspicious for fluid/fluctuation. General surgery consulted and agreed to do I&D.Patient's symptoms significantly improved after surgery. Specimen from the abscess was possitive for Staph A and rare WBC. Culture is pending at discharge. He is discharged to home with PO Bactrim x 10 dys.    Insulin dependant DM NW:GNFAOZH withMod SSI, Lantusto 22u bedtime, ContinueNovologto 7u TID. He needed extra dose of Insulin during hospitalization, due to elevated glucos around 300-400. It controled after infection controled. Discharge to continue home medications. (Glargine 30 u at bed time and Metformin 500 mg QD) and Gabapentin. Recommended to follow up with PCP.      Discharge Vitals:   BP (!) 141/90 (BP Location: Left Arm)   Pulse 72   Temp 97.6 F (36.4 C) (Oral)   Resp 19   Ht 6' (1.829 m)   Wt 112.1 kg   SpO2 97%   BMI 33.52 kg/m   Pertinent Labs, Studies, and Procedures:  CBC Latest Ref Rng & Units 03/25/2018 03/24/2018 03/23/2018  WBC 4.0 - 10.5 K/uL 12.3(H) 14.9(H) 15.6(H)  Hemoglobin 13.0 - 17.0 g/dL 12.7(L) 13.3 13.5  Hematocrit 39.0 - 52.0 % 38.6(L) 39.9 40.0  Platelets 150 - 400 K/uL 393 380 341   BMP Latest Ref Rng & Units 03/25/2018 03/24/2018 03/23/2018  Glucose 70 - 99 mg/dL 348(H) 200(H) 195(H)  BUN 6 - 20 mg/dL 12 6 7   Creatinine 0.61 - 1.24 mg/dL 0.72 0.76 0.60(L)  Sodium 135 - 145 mmol/L 134(L) 135 135  Potassium 3.5 - 5.1 mmol/L 3.9 4.3 3.9  Chloride 98 - 111 mmol/L 97(L) 98 98  CO2 22 - 32 mmol/L 28 29 28   Calcium 8.9 - 10.3 mg/dL 8.6(L) 8.5(L) 8.4(L)   Surgery specimen gram stain: Rare WBC,  Staph aureus MRSA: + Blood culture: No growth for 5 days  Discharge Instructions: Discharge Instructions    Call MD for:  difficulty breathing, headache or visual disturbances   Complete by:  As directed    Call MD for:  persistant nausea and vomiting   Complete by:  As directed    Call MD for:  redness,  tenderness, or signs of infection (pain, swelling, redness, odor or green/yellow discharge around incision site)   Complete by:  As directed    Call MD for:  severe uncontrolled pain   Complete by:  As directed    Call MD for:  temperature >100.4   Complete by:  As directed    Diet - low sodium heart healthy   Complete by:  As directed    Discharge instructions   Complete by:  As directed    Mr. Incorvaia, we have sent you home with an antibiotic that you will continue until you are out of pills.  Also we have added some short acting insulin called novolog to take with your meals.  It will be important to check your blood sugar 2-3x per day during this time and work with our clinic to control your blood sugars as this will help the healing process.  Increase activity slowly   Complete by:  As directed       Signed: Dewayne Hatch, MD 03/31/2018, 10:13 PM   Pager: [phone]

## 2018-03-28 ENCOUNTER — Ambulatory Visit: Payer: Self-pay | Admitting: Internal Medicine

## 2018-03-28 ENCOUNTER — Other Ambulatory Visit: Payer: Self-pay

## 2018-03-28 ENCOUNTER — Encounter: Payer: Self-pay | Admitting: Internal Medicine

## 2018-03-28 DIAGNOSIS — L089 Local infection of the skin and subcutaneous tissue, unspecified: Secondary | ICD-10-CM

## 2018-03-28 DIAGNOSIS — L720 Epidermal cyst: Secondary | ICD-10-CM

## 2018-03-28 MED ORDER — OXYCODONE-ACETAMINOPHEN 5-325 MG PO TABS: 1.0000 | ORAL_TABLET | ORAL | 0 refills | Status: AC | PRN

## 2018-03-28 MED FILL — OXYCODONE-ACETAMINOPHEN 5-3: 5-325 | 5 days supply | Qty: 30 | Fill #0

## 2018-03-28 NOTE — Progress Notes (Signed)
Internal Medicine Clinic Attending  Case discussed with Dr. Hoffman at the time of the visit.  We reviewed the resident's history and exam and pertinent patient test results.  I agree with the assessment, diagnosis, and plan of care documented in the resident's note.  

## 2018-03-28 NOTE — Progress Notes (Signed)
   CC: Hospital follow up for posterior neck cellulitis with abscess s/p incision and drainage   HPI:  [name] is a 43 y.o. male with history noted below the presents to the acute care clinic for hospital follow-up of neck abscess s/p I&D.  Please see problem based charting for the status of patients chronic medical conditions.  Past Medical History:  Diagnosis Date  . Hyperlipidemia   . Lesion of penis    foreskin  . OSA (obstructive sleep apnea)    per pt dx osa and used cpap but after losing wt. from 415 pounds down to 244 pounds no longer needs cpap  . Peripheral neuropathy   . Phimosis   . Scrotal abscess 05/08/2017  . Type 2 diabetes mellitus (HCC)    per pt no meds for two years pt was changed to levemir unable to afford but pt states is waiting on approval for a program he applied for that will pay for his meds [facility])      Review of Systems:  Review of Systems  Constitutional: Negative for chills and fever.  Respiratory: Negative for shortness of breath.   Gastrointestinal: Negative for nausea and vomiting.  Musculoskeletal: Positive for neck pain.     Physical Exam:  Vitals:   03/28/18 1401  BP: 123/78  Pulse: 86  Temp: 98 F (36.7 C)  TempSrc: Oral  SpO2: 98%  Weight: 250 lb 11.2 oz (113.7 kg)  Height: 6' (1.829 m)   Physical Exam  Constitutional: He is well-developed, well-nourished, and in no distress.  Cardiovascular: Normal rate, regular rhythm and normal heart sounds. Exam reveals no gallop and no friction rub.  No murmur heard. Pulmonary/Chest: Breath sounds normal. No respiratory distress. He has no wheezes. He has no rales.  Skin:  2-3 inch open wound on the posterior neck with granulation tissue and minimal pustular drainage        Assessment & Plan:   See encounters tab for problem based medical decision making.   Patient discussed with Dr. Dareen Piano

## 2018-03-28 NOTE — Patient Instructions (Signed)
Mr. Atkison,  It was a pleasure seeing you today.  Please follow up in the acute care clinic as needed.

## 2018-03-28 NOTE — Assessment & Plan Note (Signed)
Assessment:  Posterior neck cellulitis with abscess s/p incision and drainage  Patient was admitted on 2/23.  During his hospital course he had incision and drainage of abscess under general anesthesia by general surgery.  He states that since discharge on 2/28 his wound site is overall improving.  He states that he packs the area once a day.  On exam area appears healing.  He continues to take bactrim to complete a one week course.  He is to follow up with general surgery on march 10th.  He states that he was not given any pain medication at discharge and is having trouble sleeping due to the pain.  Plan -re-packed in office -percocet - follow up as needed -continue bactrim

## 2018-03-29 LAB — AEROBIC/ANAEROBIC CULTURE (SURGICAL/DEEP WOUND)

## 2018-03-29 LAB — AEROBIC/ANAEROBIC CULTURE W GRAM STAIN (SURGICAL/DEEP WOUND)

## 2018-04-08 ENCOUNTER — Encounter: Payer: Self-pay | Admitting: Internal Medicine

## 2018-04-13 MED FILL — metFORMIN HCL ER 500 MG TB2: 500 | 30 days supply | Qty: 30 | Fill #1

## 2018-04-13 MED FILL — LANTUS SOLOSTAR 100 UNITS/M: 100 | 30 days supply | Qty: 9 | Fill #1

## 2018-04-18 NOTE — Progress Notes (Deleted)
Subjective:    Patient ID: Shaun Cummings, male    DOB: 03-21-1975, 43 y.o.   MRN: [id number]  Date of Admission: 03/20/2018  9:37 AM Date of Discharge: 03/25/2018 Attending Physician: No att. providers found  Discharge Diagnosis: 1. Active Problems:   Cellulitis   Malnutrition of moderate degree    Discharge Medications: Allergies as of 03/25/2018     Reactions  Augmentin (amoxicillin-pot Clavulanate) Hives, Itching  Has patient had a PCN reaction causing immediate rash, facial/tongue/throat swelling, SOB or lightheadedness with hypotension: Yes Has patient had a PCN reaction causing severe rash involving mucus membranes or skin necrosis: No Has patient had a PCN reaction that required hospitalization: No Has patient had a PCN reaction occurring within the last 10 years: No If all of the above answers are "NO", then may proceed with Cephalosporin use.  Contrast Media (iodinated Diagnostic Agents) Hives  Pt broke out with 1 hive on forehead after CT injection-treated with 11m Benadryl   Medication List  STOP taking these medications  doxycycline 100 MG tablet Commonly known as:  VIBRA-TABS   TAKE these medications  Contour Next Monitor w/Device Kit 30 Units by Does not apply route at bedtime.  gabapentin 300 MG capsule Commonly known as:  NEURONTIN Take 1 capsule (300 mg total) by mouth at bedtime as needed.  glucose blood test strip Commonly known as:  Contour Next Test Use as instructed  insulin aspart 100 UNIT/ML FlexPen Commonly known as:  NOVOLOG Inject 9 Units into the skin 3 (three) times daily with meals.  Insulin Glargine 100 UNIT/ML Solostar Pen Commonly known as:  Lantus SoloStar Inject 30 Units into the skin daily at 10 pm.  metFORMIN 500 MG 24 hr tablet Commonly known as:  GLUCOPHAGE-XR Take 1 tablet (500 mg total) by mouth daily with breakfast.  methocarbamol 500 MG tablet Commonly known as:  ROBAXIN Take 1 tablet (500 mg  total) by mouth 2 (two) times daily.  Pen Needles 32G X 4 MM Misc 1 Bag by Does not apply route daily.  sulfamethoxazole-trimethoprim 800-160 MG tablet Commonly known as:  BACTRIM DS,SEPTRA DS Take 2 tablets by mouth 2 (two) times daily.     Disposition and follow-up:   [name]  At the hospital follow up visit please address:  1. Patient admited for cellulitis/abscess at posterir area of his scall. Underwent I & D surgery on 03/24/2018. Discharging with Bactrim x 10 days. May switch antibiotic based on specimen culture result. Please reevaluate for signs if reinfection and f/u result.  2.  Labs / imaging needed at time of follow-up: CBG, BMP, WBC. Please reevaluate abscess area at post scall  3.  Pending labs/ test needing follow-up: Abscess specimen culture    Hospital Course by problem list: 1. Cellulitis of posterior head: 43year old gentleman with insulin-dependent type 2 diabetes, presented with worsening of skin infectionon back of his head, fever, headache,despite taking p.o. doxycycline that prescribed for him in clinic. Had fever and leukocytosis on arrival. CT scan and POC ultrasound did not show drainable abscess. He was started on IV Vancomycin. BC remained negative. Although leukocytosis improved, no major improvement in pain and exam after few days.  On third day of admission, exam was suspicious for fluid/fluctuation. General surgery consulted and agreed to do I&D.Patient's symptoms significantly improved after surgery. Specimen from the abscess was possitive for Staph A and rare WBC. Culture is pending at discharge. He is discharged to  home with PO Bactrim x 10 dys.    Insulin dependant DM KF:WBLTGAI withMod SSI, Lantusto 22u bedtime, ContinueNovologto 7u TID. He needed extra dose of Insulin during hospitalization, due to elevated glucos around 300-400. It controled after  infection controled. Discharge to continue home medications. (Glargine 30 u at bed time and Metformin 500 mg QD) and Gabapentin. Recommended to follow up with PCP.     04/18/2018 Seen post hosp f/u      Review of Systems     Objective:   Physical Exam        Assessment & Plan:

## 2018-04-19 ENCOUNTER — Inpatient Hospital Stay: Payer: Self-pay | Admitting: Critical Care Medicine

## 2018-05-26 NOTE — Telephone Encounter (Signed)
A user error has taken place: encounter opened in error, closed for administrative reasons.

## 2018-12-14 ENCOUNTER — Encounter: Payer: Self-pay | Admitting: Internal Medicine

## 2019-02-05 ENCOUNTER — Encounter (HOSPITAL_COMMUNITY): Payer: Self-pay | Admitting: *Deleted

## 2019-02-05 ENCOUNTER — Ambulatory Visit (HOSPITAL_COMMUNITY)
Admission: EM | Admit: 2019-02-05 | Discharge: 2019-02-05 | Disposition: A | Discharge status: Home / Self Care | Payer: Self-pay | Attending: Family Medicine | Admitting: Family Medicine

## 2019-02-05 ENCOUNTER — Other Ambulatory Visit: Payer: Self-pay

## 2019-02-05 DIAGNOSIS — M5414 Radiculopathy, thoracic region: Secondary | ICD-10-CM

## 2019-02-05 DIAGNOSIS — M546 Pain in thoracic spine: Secondary | ICD-10-CM

## 2019-02-05 MED ORDER — IBUPROFEN 600 MG PO TABS: 600.0000 mg | ORAL_TABLET | Freq: Four times a day (QID) | ORAL | 0 refills | Status: DC | PRN

## 2019-02-05 MED ORDER — METHOCARBAMOL 500 MG PO TABS: 500.0000 mg | ORAL_TABLET | Freq: Two times a day (BID) | ORAL | 0 refills | Status: DC

## 2019-02-05 NOTE — ED Triage Notes (Signed)
C/O intermittent pains in left chest, occasionally radiating from left scapular area through to chest.  Also c/o pain radiating down into LUE when pain occurs.  States occcasionally has stomach pain with the chest pain.  Vaguely describes pain as being non-reproduceable.

## 2019-02-05 NOTE — Discharge Instructions (Addendum)
I believe  most likely your pain is related to muscle spasming, due to atrophy and posture. Then in turn your have some nerve inflammation.  Take ibuprofen 600 mg every 8 hours for the next week.  Muscle relaxant as needed.  Gentle stretching, heat to the area and massage may help. Improving posture and doing some upper body exercises to build muscle may help.  Your EKG was normal here today.  Based on your family history if this returns or worsens I would go to the ER for further work up.

## 2019-02-05 NOTE — ED Provider Notes (Signed)
[facility]    CSN: [id number] Arrival date & time: 02/05/19  1118      History   Chief Complaint Chief Complaint  Patient presents with  . Chest Pain    HPI Shaun Cummings is a 44 y.o. male.   Patient is a 44 year old male past medical history of hyperlipidemia, OSA, neuropathy, diabetes type 2, hypertension, cellulitis.  He presents today with intermittent left chest, left upper back and left arm pain for approximately 3 weeks.  Describes the pain as mostly sharp and stabbing when it comes.  Reporting symptoms have worsened over the last 4 to 5 days.  Occasionally radiating from the left scapular area throughout the chest.  He has had some radiculopathy to the left hand and fourth and fifth finger.  Does notice the pain more with movement at times.  Concerned due to family history of cardiac disease.  Reporting that he did a 2-minute exercise to increase heart rate and this did not make the pain worse.  The pain does not get better with rest.  The pain sometimes comes on even during rest.  His blood sugars have been running in the 200s and he checks these 2-3 times a day.  Denies any injuries.  Reporting over the past year he has had decreased exercise and more sedentary lifestyle.  He currently sits at a desk daily and is reporting bad posture.  His previous job was more manual.  He is also not been as active.  Denies any recent long distance traveling.  He does not smoke.  No calf pain or swelling.  No history of DVT or PE.  No cough, chest congestion or fevers. No arm swelling, or increased warmth.   ROS per HPI      Past Medical History:  Diagnosis Date  . Hyperlipidemia   . Lesion of penis    foreskin  . OSA (obstructive sleep apnea)    per pt dx osa and used cpap but after losing wt. from 415 pounds down to 244 pounds no longer needs cpap  . Peripheral neuropathy   . Phimosis   . Scrotal abscess 05/08/2017  . Type 2 diabetes mellitus (HCC)    per pt no meds  for two years pt was changed to levemir unable to afford but pt states is waiting on approval for a program he applied for that will pay for his meds [facility])      Patient Active Problem List   Diagnosis Date Noted  . Malnutrition of moderate degree 03/22/2018  . Cellulitis 03/20/2018  . Infected epithelial inclusion cyst 03/14/2018  . Hypertension 03/09/2018  . Peripheral neuropathy 05/21/2017  . Hyperlipidemia associated with type 2 diabetes mellitus ([place]) 09/18/2013  . Obesity (BMI 30-39.9) 09/18/2013  . Diabetes ([place]) 09/15/2013    Past Surgical History:  Procedure Laterality Date  . CIRCUMCISION N/A 02/10/2016   Procedure: CIRCUMCISION ADULT;  Surgeon: Cleon Gustin, MD;  Location: [facility];  Service: Urology;  Laterality: N/A;  . INCISION AND DRAINAGE ABSCESS Right 05/12/2017   Procedure: INCISION AND DRAINAGE RIGHT INGUINAL ABSCESS;  Surgeon: Erroll Luna, MD;  Location: [facility];  Service: General;  Laterality: Right;  . INCISION AND DRAINAGE ABSCESS N/A 03/24/2018   Procedure: INCISION AND DRAINAGE POSTERIOR NECK ABSCESS;  Surgeon: Excell Seltzer, MD;  Location: [facility];  Service: General;  Laterality: N/A;  . INCISION AND DRAINAGE PERIRECTAL ABSCESS Right 05/09/2017   Procedure: IRRIGATION AND DEBRIDEMENT PERINEAL ABSCESS;  Surgeon:  Donnie Mesa, MD;  Location: [facility];  Service: General;  Laterality: Right;  . NO PAST SURGERIES         Home Medications    Prior to Admission medications   Medication Sig Start Date End Date Taking? Authorizing Provider  ASPIRIN 81 PO Take 81 mg by mouth. Took 231m today   Yes [provider]  insulin aspart (NOVOLOG) 100 UNIT/ML FlexPen Inject 9 Units into the skin 3 (three) times daily with meals. 03/25/18  Yes WKatherine Roan MD  Insulin Glargine (LANTUS SOLOSTAR) 100 UNIT/ML Solostar Pen Inject 30 Units into the skin daily at 10 pm. 03/09/18  Yes HKathi Ludwig MD  Blood Glucose  Monitoring Suppl (CONTOUR NEXT MONITOR) w/Device KIT 30 Units by Does not apply route at bedtime. 03/09/18   HKathi Ludwig MD  gabapentin (NEURONTIN) 300 MG capsule Take 1 capsule (300 mg total) by mouth at bedtime as needed. Patient taking differently: Take 300 mg by mouth at bedtime as needed.  03/09/18   HKathi Ludwig MD  glucose blood (CONTOUR NEXT TEST) test strip Use as instructed 03/09/18   HKathi Ludwig MD  ibuprofen (ADVIL) 600 MG tablet Take 1 tablet (600 mg total) by mouth every 6 (six) hours as needed. 02/05/19   Shaconda Hajduk, TTressia MinersA, NP  Insulin Pen Needle (PEN NEEDLES) 32G X 4 MM MISC 1 Bag by Does not apply route daily. 03/25/18   WKatherine Roan MD  metFORMIN (GLUCOPHAGE-XR) 500 MG 24 hr tablet Take 1 tablet (500 mg total) by mouth daily with breakfast. 03/09/18   HKathi Ludwig MD  methocarbamol (ROBAXIN) 500 MG tablet Take 1 tablet (500 mg total) by mouth 2 (two) times daily. 02/05/19   BOrvan July NP    Family History Family History  Problem Relation Age of Onset  . Diabetes Mother   . Angina Mother   . CAD Mother   . Diabetes Father   . Heart attack Father   . Kidney failure Father   . Diabetes Sister   . Diabetes Brother   . Diabetes Maternal Grandmother   . Diabetes Maternal Grandfather   . Diabetes Paternal Grandmother   . Diabetes Paternal Grandfather     Social History Social History   Tobacco Use  . Smoking status: Former Smoker    Years: 2.00    Types: Cigarettes    Quit date: 02/05/2008    Years since quitting: 11.0  . Smokeless tobacco: Never Used  Substance Use Topics  . Alcohol use: No  . Drug use: No     Allergies   Augmentin [amoxicillin-pot clavulanate] and Contrast media [iodinated diagnostic agents]   Review of Systems Review of Systems   Physical Exam Triage Vital Signs ED Triage Vitals  Enc Vitals Group     BP 02/05/19 1139 (!) 160/105     Pulse Rate 02/05/19 1139 80     Resp 02/05/19 1139 14     Temp  02/05/19 1139 98 F (36.7 C)     Temp Source 02/05/19 1139 Oral     SpO2 02/05/19 1139 99 %     Weight --      Height --      Head Circumference --      Peak Flow --      Pain Score 02/05/19 1140 2     Pain Loc --      Pain Edu? --      Excl. in [place] --    No data found.  Updated Vital Signs BP (!) 160/105   Pulse 80   Temp 98 F (36.7 C) (Oral)   Resp 14   SpO2 99%   Visual Acuity Right Eye Distance:   Left Eye Distance:   Bilateral Distance:    Right Eye Near:   Left Eye Near:    Bilateral Near:     Physical Exam Vitals and nursing note reviewed.  Constitutional:      General: He is not in acute distress.    Appearance: Normal appearance. He is not ill-appearing, toxic-appearing or diaphoretic.  HENT:     Head: Normocephalic and atraumatic.     Nose: Nose normal.  Eyes:     Conjunctiva/sclera: Conjunctivae normal.  Pulmonary:     Effort: Pulmonary effort is normal.  Musculoskeletal:        General: No swelling. Normal range of motion.     Cervical back: Normal range of motion.     Thoracic back: Tenderness present.       Back:     Comments: Slumped posture.   Skin:    General: Skin is warm and dry.     Findings: No rash.  Neurological:     Mental Status: He is alert.     Motor: No weakness.  Psychiatric:        Mood and Affect: Mood normal.      UC Treatments / Results  Labs (all labs ordered are listed, but only abnormal results are displayed) Labs Reviewed - No data to display  EKG   Radiology No results found.  Procedures Procedures (including critical care time)  Medications Ordered in UC Medications - No data to display  Initial Impression / Assessment and Plan / UC Course  I have reviewed the triage vital signs and the nursing notes.  Pertinent labs & imaging results that were available during my care of the patient were reviewed by me and considered in my medical decision making (see chart for details).     Acute  left-sided thoracic back pain with radiation into left chest and left arm area.  Some associated radiculopathy The symptoms have been going on intermittently over the last couple weeks. Has been taking aspirin for her symptoms. Concerned due to family's cardiac history EKG with normal sinus rhythm and normal rate here today. No concern for ACS at this time. Tender to palpation of right thoracic back area without swelling, deformities. Believe patient may have some muscle spasming in the right upper back area and chest area.  This in turn is probably causing some nerve compression and inflammation which would explain the radiculopathy. Recommended 600 mg of ibuprofen every 8 hours along with Robaxin for muscle laxation. Gentle stretching, heat and massage to the area could help. If the symptoms do not improve or worsen to include worsening chest pain he will need to go to the ER for further work-up. Otherwise he can follow-up with his primary care as needed. Pt understanding and agreed.   Final Clinical Impressions(s) / UC Diagnoses   Final diagnoses:  Acute left-sided thoracic back pain  Thoracic radiculopathy     Discharge Instructions     I believe  most likely your pain is related to muscle spasming, due to atrophy and posture. Then in turn your have some nerve inflammation.  Take ibuprofen 600 mg every 8 hours for the next week.  Muscle relaxant as needed.  Gentle stretching, heat to the area and massage may help. Improving posture and doing some upper  body exercises to build muscle may help.  Your EKG was normal here today.  Based on your family history if this returns or worsens I would go to the ER for further work up.     ED Prescriptions    Medication Sig Dispense Auth. Provider   methocarbamol (ROBAXIN) 500 MG tablet Take 1 tablet (500 mg total) by mouth 2 (two) times daily. 20 tablet Sherman Lipuma A, NP   ibuprofen (ADVIL) 600 MG tablet Take 1 tablet (600 mg total) by  mouth every 6 (six) hours as needed. 30 tablet Loura Halt A, NP     PDMP not reviewed this encounter.   Loura Halt A, NP 02/05/19 1801

## 2019-02-05 NOTE — ED Notes (Signed)
EKG shown to T. Rozanna Box, NP.

## 2019-05-29 ENCOUNTER — Encounter: Payer: Self-pay | Admitting: *Deleted

## 2019-09-08 ENCOUNTER — Emergency Department (HOSPITAL_COMMUNITY): Payer: BLUE CROSS/BLUE SHIELD

## 2019-09-08 ENCOUNTER — Encounter (HOSPITAL_COMMUNITY): Payer: Self-pay

## 2019-09-08 ENCOUNTER — Emergency Department (HOSPITAL_COMMUNITY)
Admission: EM | Admit: 2019-09-08 | Discharge: 2019-09-09 | Disposition: A | Discharge status: Home / Self Care | Payer: BLUE CROSS/BLUE SHIELD | Attending: Emergency Medicine | Admitting: Emergency Medicine

## 2019-09-08 ENCOUNTER — Other Ambulatory Visit: Payer: Self-pay

## 2019-09-08 DIAGNOSIS — R519 Headache, unspecified: Secondary | ICD-10-CM | POA: Diagnosis not present

## 2019-09-08 DIAGNOSIS — R079 Chest pain, unspecified: Secondary | ICD-10-CM | POA: Diagnosis present

## 2019-09-08 DIAGNOSIS — R202 Paresthesia of skin: Secondary | ICD-10-CM | POA: Insufficient documentation

## 2019-09-08 DIAGNOSIS — H9311 Tinnitus, right ear: Secondary | ICD-10-CM | POA: Insufficient documentation

## 2019-09-08 DIAGNOSIS — G8194 Hemiplegia, unspecified affecting left nondominant side: Secondary | ICD-10-CM | POA: Diagnosis not present

## 2019-09-08 DIAGNOSIS — Z5321 Procedure and treatment not carried out due to patient leaving prior to being seen by health care provider: Secondary | ICD-10-CM | POA: Diagnosis not present

## 2019-09-08 LAB — BASIC METABOLIC PANEL
Anion gap: 10 (ref 5–15)
BUN: 7 mg/dL (ref 6–20)
CO2: 28 mmol/L (ref 22–32)
Calcium: 9.4 mg/dL (ref 8.9–10.3)
Chloride: 99 mmol/L (ref 98–111)
Creatinine, Ser: 0.84 mg/dL (ref 0.61–1.24)
GFR calc Af Amer: >60 mL/min (ref >60)
GFR calc non Af Amer: >60 mL/min (ref >60)
Glucose, Bld: 342 mg/dL — ABNORMAL HIGH (ref 70–99)
Potassium: 3.8 mmol/L (ref 3.5–5.1)
Sodium: 137 mmol/L (ref 135–145)

## 2019-09-08 LAB — TROPONIN I (HIGH SENSITIVITY): Troponin I (High Sensitivity): 36 ng/L — ABNORMAL HIGH (ref <18)

## 2019-09-08 LAB — CBC
HCT: 44.9 % (ref 39.0–52.0)
Hemoglobin: 15.6 g/dL (ref 13.0–17.0)
MCH: 29.7 pg (ref 26.0–34.0)
MCHC: 34.7 g/dL (ref 30.0–36.0)
MCV: 85.5 fL (ref 80.0–100.0)
Platelets: 322 10*3/uL (ref 150–400)
RBC: 5.25 MIL/uL (ref 4.22–5.81)
RDW: 11.9 % (ref 11.5–15.5)
WBC: 9.5 10*3/uL (ref 4.0–10.5)
nRBC: 0 % (ref 0.0–0.2)

## 2019-09-08 NOTE — ED Triage Notes (Signed)
Pt reports severe right sided headache around his ear for the past 3 months. Pt seen at [facility] recently for the same. Pt also c.o left sided chest pain and intermittent left arm numbness that started around 11am today. No neuro deficits noted in triage. Grip strength equal, no weakness noted. Pt a.o

## 2020-04-27 ENCOUNTER — Emergency Department (HOSPITAL_COMMUNITY): Payer: 59

## 2020-04-27 ENCOUNTER — Other Ambulatory Visit: Payer: Self-pay

## 2020-04-27 ENCOUNTER — Emergency Department (HOSPITAL_COMMUNITY)
Admission: EM | Admit: 2020-04-27 | Discharge: 2020-04-27 | Disposition: A | Discharge status: Home / Self Care | Payer: 59 | Attending: Emergency Medicine | Admitting: Emergency Medicine

## 2020-04-27 ENCOUNTER — Encounter (HOSPITAL_COMMUNITY): Payer: Self-pay

## 2020-04-27 DIAGNOSIS — Z87891 Personal history of nicotine dependence: Secondary | ICD-10-CM | POA: Insufficient documentation

## 2020-04-27 DIAGNOSIS — Z794 Long term (current) use of insulin: Secondary | ICD-10-CM | POA: Insufficient documentation

## 2020-04-27 DIAGNOSIS — R072 Precordial pain: Secondary | ICD-10-CM | POA: Diagnosis present

## 2020-04-27 DIAGNOSIS — Z8616 Personal history of COVID-19: Secondary | ICD-10-CM | POA: Insufficient documentation

## 2020-04-27 DIAGNOSIS — R079 Chest pain, unspecified: Secondary | ICD-10-CM

## 2020-04-27 DIAGNOSIS — I10 Essential (primary) hypertension: Secondary | ICD-10-CM | POA: Diagnosis not present

## 2020-04-27 DIAGNOSIS — E1140 Type 2 diabetes mellitus with diabetic neuropathy, unspecified: Secondary | ICD-10-CM | POA: Diagnosis not present

## 2020-04-27 DIAGNOSIS — Z7984 Long term (current) use of oral hypoglycemic drugs: Secondary | ICD-10-CM | POA: Insufficient documentation

## 2020-04-27 LAB — CBC WITH DIFFERENTIAL/PLATELET
Abs Immature Granulocytes: 0.06 10*3/uL (ref 0.00–0.07)
Basophils Absolute: 0.1 10*3/uL (ref 0.0–0.1)
Basophils Relative: 1 %
Eosinophils Absolute: 0.2 10*3/uL (ref 0.0–0.5)
Eosinophils Relative: 1 %
HCT: 41.9 % (ref 39.0–52.0)
Hemoglobin: 14.7 g/dL (ref 13.0–17.0)
Immature Granulocytes: 1 %
Lymphocytes Relative: 25 %
Lymphs Abs: 3.2 10*3/uL (ref 0.7–4.0)
MCH: 30.2 pg (ref 26.0–34.0)
MCHC: 35.1 g/dL (ref 30.0–36.0)
MCV: 86.2 fL (ref 80.0–100.0)
Monocytes Absolute: 0.6 10*3/uL (ref 0.1–1.0)
Monocytes Relative: 5 %
Neutro Abs: 8.6 10*3/uL — ABNORMAL HIGH (ref 1.7–7.7)
Neutrophils Relative %: 67 %
Platelets: 263 10*3/uL (ref 150–400)
RBC: 4.86 MIL/uL (ref 4.22–5.81)
RDW: 12.1 % (ref 11.5–15.5)
WBC: 12.8 10*3/uL — ABNORMAL HIGH (ref 4.0–10.5)
nRBC: 0 % (ref 0.0–0.2)

## 2020-04-27 LAB — BASIC METABOLIC PANEL
Anion gap: 8 (ref 5–15)
BUN: 16 mg/dL (ref 6–20)
CO2: 26 mmol/L (ref 22–32)
Calcium: 9.1 mg/dL (ref 8.9–10.3)
Chloride: 101 mmol/L (ref 98–111)
Creatinine, Ser: 0.84 mg/dL (ref 0.61–1.24)
GFR, Estimated: >60 mL/min (ref >60)
Glucose, Bld: 373 mg/dL — ABNORMAL HIGH (ref 70–99)
Potassium: 4.4 mmol/L (ref 3.5–5.1)
Sodium: 135 mmol/L (ref 135–145)

## 2020-04-27 LAB — URINALYSIS, ROUTINE W REFLEX MICROSCOPIC
Bacteria, UA: NONE SEEN
Bilirubin Urine: NEGATIVE
Glucose, UA: >=500 mg/dL — AB
Ketones, ur: NEGATIVE mg/dL
Leukocytes,Ua: NEGATIVE
Nitrite: NEGATIVE
Protein, ur: 100 mg/dL — AB
Specific Gravity, Urine: 1.025 (ref 1.005–1.030)
pH: 6 (ref 5.0–8.0)

## 2020-04-27 LAB — TROPONIN I (HIGH SENSITIVITY)
Troponin I (High Sensitivity): 7 ng/L (ref <18)
Troponin I (High Sensitivity): 4 ng/L (ref <18)

## 2020-04-27 NOTE — Discharge Instructions (Signed)
Your workup was very reassuring in the ED today. Please follow up with your PCP as scheduled for Monday for further evaluation - please discuss your elevation blood pressure readings. You may need to be started on BP medications.   I have placed an ambulatory referral to the cardiologist. They will call to schedule an appointment for you given your intermittent chest pains.   Return to the ED for any worsening symptoms

## 2020-04-27 NOTE — ED Provider Notes (Signed)
[facility] DEPT Provider Note   CSN: [id number] Arrival date & time: 04/27/20  0845     History No chief complaint on file.   Shaun Cummings is a 45 y.o. male with PMHx HLD and Diabetes who presents to the ED today as advised by his PCP.  She states that he had a regular follow-up with his PCP last week.  He mentioned that he has been having intermittent substernal chest pain as well as a tingling sensation in his left arm for approximately 1.5 years (pt estimation).  And then states it began after he was diagnosed with Covid 1.5 years ago however per chart review patient was diagnosed with Covid in August 2021.  He states that she did an EKG at his PCPs office and thought it appeared abnormal and advised him to go to the ED immediately for further evaluation.  Patient did not have anyone to stay with his kids last week which is why he did not come last week.  He is here now as he had childcare.  He is not having any active chest pain.  He does mention that a couple of months ago he went to [facility] ED for further evaluation of his chest pain however left prior to being seen.  Troponin at that time was slightly elevated at 36.  Patient is a never smoker.  He does endorse positive family history for CAD less than 86 years old.  His father unfortunately passed away at the age of 66 due to heart attack.  He reports his mother has multiple stents in her heart which she had placed in her 39s.  Patient denies any recent prolonged travel or immobilization.  No history of DVT/PE.  No exogenous hormone use.  No active malignancy.   The history is provided by the patient and medical records.       Past Medical History:  Diagnosis Date  . Hyperlipidemia   . Lesion of penis    foreskin  . OSA (obstructive sleep apnea)    per pt dx osa and used cpap but after losing wt. from 415 pounds down to 244 pounds no longer needs cpap  . Peripheral neuropathy   . Phimosis   . Scrotal  abscess 05/08/2017  . Type 2 diabetes mellitus (HCC)    per pt no meds for two years pt was changed to levemir unable to afford but pt states is waiting on approval for a program he applied for that will pay for his meds [facility])      Patient Active Problem List   Diagnosis Date Noted  . Malnutrition of moderate degree 03/22/2018  . Cellulitis 03/20/2018  . Infected epithelial inclusion cyst 03/14/2018  . Hypertension 03/09/2018  . Peripheral neuropathy 05/21/2017  . Hyperlipidemia associated with type 2 diabetes mellitus ([place]) 09/18/2013  . Obesity (BMI 30-39.9) 09/18/2013  . Diabetes ([place]) 09/15/2013    Past Surgical History:  Procedure Laterality Date  . CIRCUMCISION N/A 02/10/2016   Procedure: CIRCUMCISION ADULT;  Surgeon: Cleon Gustin, MD;  Location: [facility];  Service: Urology;  Laterality: N/A;  . INCISION AND DRAINAGE ABSCESS Right 05/12/2017   Procedure: INCISION AND DRAINAGE RIGHT INGUINAL ABSCESS;  Surgeon: Erroll Luna, MD;  Location: [place];  Service: General;  Laterality: Right;  . INCISION AND DRAINAGE ABSCESS N/A 03/24/2018   Procedure: INCISION AND DRAINAGE POSTERIOR NECK ABSCESS;  Surgeon: Excell Seltzer, MD;  Location: [place];  Service: General;  Laterality: N/A;  .  INCISION AND DRAINAGE PERIRECTAL ABSCESS Right 05/09/2017   Procedure: IRRIGATION AND DEBRIDEMENT PERINEAL ABSCESS;  Surgeon: Donnie Mesa, MD;  Location: [place];  Service: General;  Laterality: Right;  . NO PAST SURGERIES         Family History  Problem Relation Age of Onset  . Diabetes Mother   . Angina Mother   . CAD Mother   . Diabetes Father   . Heart attack Father   . Kidney failure Father   . Diabetes Sister   . Diabetes Brother   . Diabetes Maternal Grandmother   . Diabetes Maternal Grandfather   . Diabetes Paternal Grandmother   . Diabetes Paternal Grandfather     Social History   Tobacco Use  . Smoking status: Former Smoker    Years:  2.00    Types: Cigarettes    Quit date: 02/05/2008    Years since quitting: 12.2  . Smokeless tobacco: Never Used  Vaping Use  . Vaping Use: Never used  Substance Use Topics  . Alcohol use: No  . Drug use: No    Home Medications Prior to Admission medications   Medication Sig Start Date End Date Taking? Authorizing Provider  glipiZIDE (GLUCOTROL XL) 10 MG 24 hr tablet Take 10 mg by mouth every morning. 04/25/20  Yes [provider]  insulin aspart (NOVOLOG) 100 UNIT/ML FlexPen Inject 9 Units into the skin 3 (three) times daily with meals. 03/25/18  Yes Katherine Roan, MD  NOVOLIN N RELION 100 UNIT/ML injection Inject 20-25 Units into the skin in the morning and at bedtime. 25 in the morning and 20 units at bedtime 04/25/20  Yes [provider]  Blood Glucose Monitoring Suppl (CONTOUR NEXT MONITOR) w/Device KIT 30 Units by Does not apply route at bedtime. 03/09/18   Kathi Ludwig, MD  gabapentin (NEURONTIN) 300 MG capsule Take 1 capsule (300 mg total) by mouth at bedtime as needed. Patient not taking: No sig reported 03/09/18   Kathi Ludwig, MD  glucose blood (CONTOUR NEXT TEST) test strip Use as instructed Patient not taking: Reported on 09/08/2019 03/09/18   Kathi Ludwig, MD  ibuprofen (ADVIL) 600 MG tablet Take 1 tablet (600 mg total) by mouth every 6 (six) hours as needed. Patient not taking: No sig reported 02/05/19   Loura Halt A, NP  Insulin Glargine (LANTUS SOLOSTAR) 100 UNIT/ML Solostar Pen Inject 30 Units into the skin daily at 10 pm. Patient not taking: No sig reported 03/09/18   Kathi Ludwig, MD  Insulin Pen Needle (PEN NEEDLES) 32G X 4 MM MISC 1 Bag by Does not apply route daily. 03/25/18   Katherine Roan, MD  metFORMIN (GLUCOPHAGE-XR) 500 MG 24 hr tablet Take 1 tablet (500 mg total) by mouth daily with breakfast. Patient not taking: No sig reported 03/09/18   Kathi Ludwig, MD    Allergies    Augmentin [amoxicillin-pot  clavulanate] and Contrast media [iodinated diagnostic agents]  Review of Systems   Review of Systems  Constitutional: Negative for chills, diaphoresis and fever.  Cardiovascular: Positive for chest pain.  Gastrointestinal: Negative for nausea and vomiting.  All other systems reviewed and are negative.   Physical Exam Updated Vital Signs BP (!) 168/109 (BP Location: Right Arm)   Pulse 87   Temp 98 F (36.7 C) (Oral)   Resp 16   SpO2 98%   Physical Exam Vitals and nursing note reviewed.  Constitutional:      Appearance: He is obese. He is not ill-appearing or  diaphoretic.  HENT:     Head: Normocephalic and atraumatic.  Eyes:     Conjunctiva/sclera: Conjunctivae normal.  Cardiovascular:     Rate and Rhythm: Normal rate and regular rhythm.     Pulses: Normal pulses.  Pulmonary:     Effort: Pulmonary effort is normal.     Breath sounds: Normal breath sounds. No wheezing, rhonchi or rales.  Chest:     Chest wall: No tenderness.  Abdominal:     Palpations: Abdomen is soft.     Tenderness: There is no abdominal tenderness. There is no guarding or rebound.  Musculoskeletal:     Cervical back: Neck supple.     Right lower leg: No edema.     Left lower leg: No edema.  Skin:    General: Skin is warm and dry.  Neurological:     Mental Status: He is alert.     ED Results / Procedures / Treatments   Labs (all labs ordered are listed, but only abnormal results are displayed) Labs Reviewed  BASIC METABOLIC PANEL - Abnormal; Notable for the following components:      Result Value   Glucose, Bld 373 (*)    All other components within normal limits  CBC WITH DIFFERENTIAL/PLATELET - Abnormal; Notable for the following components:   WBC 12.8 (*)    Neutro Abs 8.6 (*)    All other components within normal limits  URINALYSIS, ROUTINE W REFLEX MICROSCOPIC - Abnormal; Notable for the following components:   Glucose, UA >=500 (*)    Hgb urine dipstick SMALL (*)    Protein, ur 100  (*)    All other components within normal limits  TROPONIN I (HIGH SENSITIVITY)  TROPONIN I (HIGH SENSITIVITY)    EKG EKG Interpretation  Date/Time:  Saturday April 27 2020 09:33:28 EDT Ventricular Rate:  75 PR Interval:  169 QRS Duration: 94 QT Interval:  387 QTC Calculation: 433 R Axis:   66 Text Interpretation: Sinus rhythm Confirmed by Octaviano Glow [id number]) on 04/27/2020 9:35:15 AM   Radiology DG Chest 2 View  Result Date: 04/27/2020 CLINICAL DATA:  Chest pain EXAM: CHEST - 2 VIEW COMPARISON:  09/08/2019 FINDINGS: The heart size and mediastinal contours are within normal limits. Both lungs are clear. Unchanged mild anterior wedging of the T11 vertebral body. IMPRESSION: No active cardiopulmonary disease. Electronically Signed   By: Miachel Roux M.D.   On: 04/27/2020 11:09    Procedures Procedures   Medications Ordered in ED Medications - No data to display  ED Course  I have reviewed the triage vital signs and the nursing notes.  Pertinent labs & imaging results that were available during my care of the patient were reviewed by me and considered in my medical decision making (see chart for details).    MDM Rules/Calculators/A&P                          45 year old male who presents to the ED today after reporting that he had an abnormal EKG done at PCPs office last week.  Has been having intermittent substernal chest pain with associated tingling in his left arm since being diagnosed with Covid in August 2021.  Not currently having the symptoms.  He does have positive family history of CAD at a young age.  Patient is obese and diabetic.  He denies any other risk factors for ACS.  His blood pressure is elevated on arrival today at 168/109 however he does  report that he is anxious about being here.  No known history of hypertension.  Not on any medications for it currently.  Vitals unremarkable on arrival.  Patient appears to be in no acute distress.  Will obtain EKG and  work-up for ACS at this time.  He is PERC negative today.  He has equal pulses bilaterally without any neurological symptoms to suggest dissection.  If work-up unremarkable at this time will send back to PCPs office.   EKG Here in the ED with NSR; no acute ischemic changes.   CBC with leukocytosis 12,800 however pt without infectious symptoms today. Hgb stable at 14.7 BMP with glucose 373, bicarb WNL 26, no gap. Creatinine WNL 0.84. It does appear pt's glucose is persistently in the 300s. He has not missed any doses of his diabetic medications.  Troponin of 7. Will plan to repeat.  CXR clear.  U/A with > 500 glucose. No infection.   Repeat troponin of 4.  Blood pressure continues to be elevated in the ED. Most recent 148/96 with highest 169/109 on arrival. It does appear that pt has had elevated BP in the past however is not on any medication for same. Do feel he will likely need to start on meds with PCP. Will have him follow up with PCP for further eval. Pt in agreement with plan and stable for discharge home.   This note was prepared using Dragon voice recognition software and may include unintentional dictation errors due to the inherent limitations of voice recognition software.  Final Clinical Impression(s) / ED Diagnoses Final diagnoses:  Nonspecific chest pain  Primary hypertension    Rx / DC Orders ED Discharge Orders         Ordered    Ambulatory referral to Cardiology        04/27/20 1327           Discharge Instructions     Your workup was very reassuring in the ED today. Please follow up with your PCP as scheduled for Monday for further evaluation - please discuss your elevation blood pressure readings. You may need to be started on BP medications.   I have placed an ambulatory referral to the cardiologist. They will call to schedule an appointment for you given your intermittent chest pains.   Return to the ED for any worsening symptoms       Eustaquio Maize,  PA-C 04/27/20 1332    Wyvonnia Dusky, MD 04/27/20 [phone]

## 2020-04-27 NOTE — ED Triage Notes (Signed)
Patient here with c/o abnormal EKG that was done on Thursday at the doctors office. Patient was advice to come to ED on Thursday but he never did because she did not have anyone to stay with his kids. Patient denies chest pain.

## 2020-04-27 NOTE — ED Notes (Signed)
Urinal bedside

## 2020-06-10 ENCOUNTER — Emergency Department (HOSPITAL_COMMUNITY): Payer: 59

## 2020-06-10 ENCOUNTER — Emergency Department (HOSPITAL_COMMUNITY)
Admission: EM | Admit: 2020-06-10 | Discharge: 2020-06-10 | Disposition: A | Discharge status: Home / Self Care | Payer: 59 | Attending: Emergency Medicine | Admitting: Emergency Medicine

## 2020-06-10 ENCOUNTER — Encounter (HOSPITAL_COMMUNITY): Payer: Self-pay | Admitting: Emergency Medicine

## 2020-06-10 ENCOUNTER — Other Ambulatory Visit: Payer: Self-pay

## 2020-06-10 DIAGNOSIS — I10 Essential (primary) hypertension: Secondary | ICD-10-CM | POA: Diagnosis not present

## 2020-06-10 DIAGNOSIS — Z87891 Personal history of nicotine dependence: Secondary | ICD-10-CM | POA: Diagnosis not present

## 2020-06-10 DIAGNOSIS — Z7984 Long term (current) use of oral hypoglycemic drugs: Secondary | ICD-10-CM | POA: Insufficient documentation

## 2020-06-10 DIAGNOSIS — L02413 Cutaneous abscess of right upper limb: Secondary | ICD-10-CM | POA: Insufficient documentation

## 2020-06-10 DIAGNOSIS — R1031 Right lower quadrant pain: Secondary | ICD-10-CM | POA: Insufficient documentation

## 2020-06-10 DIAGNOSIS — L0291 Cutaneous abscess, unspecified: Secondary | ICD-10-CM

## 2020-06-10 DIAGNOSIS — Z794 Long term (current) use of insulin: Secondary | ICD-10-CM | POA: Insufficient documentation

## 2020-06-10 DIAGNOSIS — Z23 Encounter for immunization: Secondary | ICD-10-CM | POA: Insufficient documentation

## 2020-06-10 DIAGNOSIS — M79604 Pain in right leg: Secondary | ICD-10-CM | POA: Diagnosis present

## 2020-06-10 DIAGNOSIS — E1140 Type 2 diabetes mellitus with diabetic neuropathy, unspecified: Secondary | ICD-10-CM | POA: Diagnosis not present

## 2020-06-10 LAB — CBC WITH DIFFERENTIAL/PLATELET
Abs Immature Granulocytes: 0.18 10*3/uL — ABNORMAL HIGH (ref 0.00–0.07)
Basophils Absolute: 0.1 10*3/uL (ref 0.0–0.1)
Basophils Relative: 1 %
Eosinophils Absolute: 0.2 10*3/uL (ref 0.0–0.5)
Eosinophils Relative: 1 %
HCT: 41.1 % (ref 39.0–52.0)
Hemoglobin: 13.9 g/dL (ref 13.0–17.0)
Immature Granulocytes: 1 %
Lymphocytes Relative: 21 %
Lymphs Abs: 4.2 10*3/uL — ABNORMAL HIGH (ref 0.7–4.0)
MCH: 29.8 pg (ref 26.0–34.0)
MCHC: 33.8 g/dL (ref 30.0–36.0)
MCV: 88.2 fL (ref 80.0–100.0)
Monocytes Absolute: 1.5 10*3/uL — ABNORMAL HIGH (ref 0.1–1.0)
Monocytes Relative: 7 %
Neutro Abs: 13.8 10*3/uL — ABNORMAL HIGH (ref 1.7–7.7)
Neutrophils Relative %: 69 %
Platelets: 336 10*3/uL (ref 150–400)
RBC: 4.66 MIL/uL (ref 4.22–5.81)
RDW: 12.3 % (ref 11.5–15.5)
WBC: 20 10*3/uL — ABNORMAL HIGH (ref 4.0–10.5)
nRBC: 0 % (ref 0.0–0.2)

## 2020-06-10 LAB — COMPREHENSIVE METABOLIC PANEL
ALT: 14 U/L (ref 0–44)
AST: 15 U/L (ref 15–41)
Albumin: 3.5 g/dL (ref 3.5–5.0)
Alkaline Phosphatase: 73 U/L (ref 38–126)
Anion gap: 7 (ref 5–15)
BUN: 18 mg/dL (ref 6–20)
CO2: 30 mmol/L (ref 22–32)
Calcium: 9.2 mg/dL (ref 8.9–10.3)
Chloride: 101 mmol/L (ref 98–111)
Creatinine, Ser: 0.95 mg/dL (ref 0.61–1.24)
GFR, Estimated: >60 mL/min (ref >60)
Glucose, Bld: 116 mg/dL — ABNORMAL HIGH (ref 70–99)
Potassium: 4 mmol/L (ref 3.5–5.1)
Sodium: 138 mmol/L (ref 135–145)
Total Bilirubin: 0.4 mg/dL (ref 0.3–1.2)
Total Protein: 7 g/dL (ref 6.5–8.1)

## 2020-06-10 MED ORDER — DOXYCYCLINE HYCLATE 100 MG PO CAPS
100.0000 mg | ORAL_CAPSULE | Freq: Two times a day (BID) | ORAL | 0 refills | Status: DC
  Filled 2020-06-10: qty 20, 10d supply, fill #0

## 2020-06-10 MED ORDER — LIDOCAINE HCL 2 % IJ SOLN
15.0000 mL | Freq: Once | INTRAMUSCULAR | Status: AC
  Administered 2020-06-10: 300 mg via INTRADERMAL
  Filled 2020-06-10: qty 20

## 2020-06-10 MED ORDER — TETANUS-DIPHTH-ACELL PERTUSSIS 5-2.5-18.5 LF-MCG/0.5 IM SUSY
0.5000 mL | PREFILLED_SYRINGE | Freq: Once | INTRAMUSCULAR | Status: AC
  Administered 2020-06-10: 0.5 mL via INTRAMUSCULAR
  Filled 2020-06-10: qty 0.5

## 2020-06-10 NOTE — ED Provider Notes (Signed)
Emergency Medicine Provider Triage Evaluation Note  Shaun Cummings , a 45 y.o. male  was evaluated in triage.  Pt complains of abscess R groin.  Review of Systems  Positive: Abscess Negative: Fever, injury  Physical Exam  BP (!) 166/91 (BP Location: Left Arm)   Pulse (!) 102   Temp 98.9 F (37.2 C) (Oral)   Resp 18   Ht 6' (1.829 m)   Wt 111.1 kg   SpO2 97%   BMI 33.23 kg/m  Gen:   Awake, no distress   Resp:  Normal effort  MSK:   Moves extremities without difficulty  Other:  An area of induration without obvious fluctuance were noted to R medial proximal thigh/groin region with ttp.  Medical Decision Making  Medically screening exam initiated at 2:55 PM.  Appropriate orders placed.  Shaun Cummings was informed that the remainder of the evaluation will be completed by another provider, this initial triage assessment does not replace that evaluation, and the importance of remaining in the ED until their evaluation is complete.  Pain and swelling to R groin x 3 day.  Hx of abscess in the past.  Due to location, may need bedside doppler to confirm abscess.  Hx of diabetes, not UTD with tdap.    Domenic Moras, PA-C 06/10/20 1457    Gareth Morgan, MD 06/12/20 [phone]

## 2020-06-10 NOTE — ED Triage Notes (Addendum)
Patient presents with an bscess in groin for 3 days. Says is is large and painful. He also reports pain in the right leg.

## 2020-06-10 NOTE — ED Provider Notes (Signed)
[facility] DEPT Provider Note   CSN: [id number] Arrival date & time: 06/10/20  1441     History Chief Complaint  Patient presents with  . Leg Pain  . Groin Pain  . Abscess    Shaun Cummings is a 45 y.o. male.  46 year old male presents with pain and swelling to his right groin.  History of abscess for the past and this feels like the same.  Has been there for several days.  Denies any fever or chills.  No emesis noted.  Does have history of diabetes        Past Medical History:  Diagnosis Date  . Hyperlipidemia   . Lesion of penis    foreskin  . OSA (obstructive sleep apnea)    per pt dx osa and used cpap but after losing wt. from 415 pounds down to 244 pounds no longer needs cpap  . Peripheral neuropathy   . Phimosis   . Scrotal abscess 05/08/2017  . Type 2 diabetes mellitus (HCC)    per pt no meds for two years pt was changed to levemir unable to afford but pt states is waiting on approval for a program he applied for that will pay for his meds [facility])      Patient Active Problem List   Diagnosis Date Noted  . Malnutrition of moderate degree 03/22/2018  . Cellulitis 03/20/2018  . Infected epithelial inclusion cyst 03/14/2018  . Hypertension 03/09/2018  . Peripheral neuropathy 05/21/2017  . Hyperlipidemia associated with type 2 diabetes mellitus ([place]) 09/18/2013  . Obesity (BMI 30-39.9) 09/18/2013  . Diabetes ([place]) 09/15/2013    Past Surgical History:  Procedure Laterality Date  . CIRCUMCISION N/A 02/10/2016   Procedure: CIRCUMCISION ADULT;  Surgeon: Cleon Gustin, MD;  Location: [facility];  Service: Urology;  Laterality: N/A;  . INCISION AND DRAINAGE ABSCESS Right 05/12/2017   Procedure: INCISION AND DRAINAGE RIGHT INGUINAL ABSCESS;  Surgeon: Erroll Luna, MD;  Location: [place];  Service: General;  Laterality: Right;  . INCISION AND DRAINAGE ABSCESS N/A 03/24/2018   Procedure: INCISION AND  DRAINAGE POSTERIOR NECK ABSCESS;  Surgeon: Excell Seltzer, MD;  Location: [place];  Service: General;  Laterality: N/A;  . INCISION AND DRAINAGE PERIRECTAL ABSCESS Right 05/09/2017   Procedure: IRRIGATION AND DEBRIDEMENT PERINEAL ABSCESS;  Surgeon: Donnie Mesa, MD;  Location: [place];  Service: General;  Laterality: Right;  . NO PAST SURGERIES         Family History  Problem Relation Age of Onset  . Diabetes Mother   . Angina Mother   . CAD Mother   . Diabetes Father   . Heart attack Father   . Kidney failure Father   . Diabetes Sister   . Diabetes Brother   . Diabetes Maternal Grandmother   . Diabetes Maternal Grandfather   . Diabetes Paternal Grandmother   . Diabetes Paternal Grandfather     Social History   Tobacco Use  . Smoking status: Former Smoker    Years: 2.00    Types: Cigarettes    Quit date: 02/05/2008    Years since quitting: 12.3  . Smokeless tobacco: Never Used  Vaping Use  . Vaping Use: Never used  Substance Use Topics  . Alcohol use: No  . Drug use: No    Home Medications Prior to Admission medications   Medication Sig Start Date End Date Taking? Authorizing Provider  Blood Glucose Monitoring Suppl (CONTOUR NEXT MONITOR) w/Device KIT 30 Units  by Does not apply route at bedtime. 03/09/18   Kathi Ludwig, MD  gabapentin (NEURONTIN) 300 MG capsule Take 1 capsule (300 mg total) by mouth at bedtime as needed. Patient not taking: No sig reported 03/09/18   Kathi Ludwig, MD  glipiZIDE (GLUCOTROL XL) 10 MG 24 hr tablet Take 10 mg by mouth every morning. 04/25/20   [provider]  glucose blood (CONTOUR NEXT TEST) test strip Use as instructed Patient not taking: Reported on 09/08/2019 03/09/18   Kathi Ludwig, MD  ibuprofen (ADVIL) 600 MG tablet Take 1 tablet (600 mg total) by mouth every 6 (six) hours as needed. Patient not taking: No sig reported 02/05/19   Loura Halt A, NP  insulin aspart (NOVOLOG) 100 UNIT/ML FlexPen Inject 9  Units into the skin 3 (three) times daily with meals. 03/25/18   Katherine Roan, MD  Insulin Glargine (LANTUS SOLOSTAR) 100 UNIT/ML Solostar Pen Inject 30 Units into the skin daily at 10 pm. Patient not taking: No sig reported 03/09/18   Kathi Ludwig, MD  Insulin Pen Needle (PEN NEEDLES) 32G X 4 MM MISC 1 Bag by Does not apply route daily. 03/25/18   Katherine Roan, MD  metFORMIN (GLUCOPHAGE-XR) 500 MG 24 hr tablet Take 1 tablet (500 mg total) by mouth daily with breakfast. Patient not taking: No sig reported 03/09/18   Kathi Ludwig, MD  NOVOLIN N RELION 100 UNIT/ML injection Inject 20-25 Units into the skin in the morning and at bedtime. 25 in the morning and 20 units at bedtime 04/25/20   [provider]    Allergies    Augmentin [amoxicillin-pot clavulanate] and Contrast media [iodinated diagnostic agents]  Review of Systems   Review of Systems  All other systems reviewed and are negative.   Physical Exam Updated Vital Signs BP (!) 158/87 (BP Location: Left Arm)   Pulse 91   Temp 98.9 F (37.2 C) (Oral)   Resp 15   Ht 1.829 m (6')   Wt 111.1 kg   SpO2 98%   BMI 33.23 kg/m   Physical Exam Vitals and nursing note reviewed.  Constitutional:      General: He is not in acute distress.    Appearance: Normal appearance. He is well-developed. He is not toxic-appearing.  HENT:     Head: Normocephalic and atraumatic.  Eyes:     General: Lids are normal.     Conjunctiva/sclera: Conjunctivae normal.     Pupils: Pupils are equal, round, and reactive to light.  Neck:     Thyroid: No thyroid mass.     Trachea: No tracheal deviation.  Cardiovascular:     Rate and Rhythm: Normal rate and regular rhythm.     Heart sounds: Normal heart sounds. No murmur heard. No gallop.   Pulmonary:     Effort: Pulmonary effort is normal. No respiratory distress.     Breath sounds: Normal breath sounds. No stridor. No decreased breath sounds, wheezing, rhonchi or rales.   Abdominal:     General: Bowel sounds are normal. There is no distension.     Palpations: Abdomen is soft.     Tenderness: There is no abdominal tenderness. There is no rebound.  Genitourinary:   Musculoskeletal:        General: No tenderness. Normal range of motion.     Cervical back: Normal range of motion and neck supple.  Skin:    General: Skin is warm and dry.     Findings: No abrasion or rash.  Neurological:     Mental Status: He is alert and oriented to person, place, and time.     GCS: GCS eye subscore is 4. GCS verbal subscore is 5. GCS motor subscore is 6.     Cranial Nerves: No cranial nerve deficit.     Sensory: No sensory deficit.  Psychiatric:        Speech: Speech normal.        Behavior: Behavior normal.     ED Results / Procedures / Treatments   Labs (all labs ordered are listed, but only abnormal results are displayed) Labs Reviewed  CBC WITH DIFFERENTIAL/PLATELET - Abnormal; Notable for the following components:      Result Value   WBC 20.0 (*)    Neutro Abs 13.8 (*)    Lymphs Abs 4.2 (*)    Monocytes Absolute 1.5 (*)    Abs Immature Granulocytes 0.18 (*)    All other components within normal limits  COMPREHENSIVE METABOLIC PANEL - Abnormal; Notable for the following components:   Glucose, Bld 116 (*)    All other components within normal limits    EKG None  Radiology No results found.  Procedures .Marland KitchenIncision and Drainage  Date/Time: 06/10/2020 11:19 PM Performed by: Lacretia Leigh, MD Authorized by: Lacretia Leigh, MD   Consent:    Consent obtained:  Verbal   Consent given by:  Patient   Risks discussed:  Bleeding   Alternatives discussed:  No treatment Universal protocol:    Patient identity confirmed:  Verbally with patient Location:    Type:  Abscess   Location:  Lower extremity   Lower extremity location:  Leg   Leg location:  R upper leg Pre-procedure details:    Skin preparation:  Povidone-iodine Sedation:    Sedation type:   None Anesthesia:    Anesthesia method:  Local infiltration   Local anesthetic:  Lidocaine 2% WITH epi Procedure type:    Complexity:  Complex Procedure details:    Ultrasound guidance: no     Needle aspiration: no     Incision types:  Single straight   Incision depth:  Subcutaneous   Wound management:  Probed and deloculated   Drainage:  Purulent and bloody   Drainage amount:  Moderate   Wound treatment:  Wound left open   Packing materials:  1/4 in gauze Post-procedure details:    Procedure completion:  Tolerated     Medications Ordered in ED Medications  Tdap (BOOSTRIX) injection 0.5 mL (0.5 mLs Intramuscular Given 06/10/20 1932)    ED Course  I have reviewed the triage vital signs and the nursing notes.  Pertinent labs & imaging results that were available during my care of the patient were reviewed by me and considered in my medical decision making (see chart for details).    MDM Rules/Calculators/A&P                         Labs are significant for leukocytosis of 20,000. CT scan negative for deep abscess. Patient's abscess I&D as above.  Will place on doxycycline discharge Final Clinical Impression(s) / ED Diagnoses Final diagnoses:  None    Rx / DC Orders ED Discharge Orders    None       Lacretia Leigh, MD 06/10/20 2320

## 2020-06-11 ENCOUNTER — Other Ambulatory Visit (HOSPITAL_COMMUNITY): Payer: Self-pay

## 2020-06-18 ENCOUNTER — Other Ambulatory Visit: Payer: Self-pay

## 2020-06-18 ENCOUNTER — Observation Stay (HOSPITAL_COMMUNITY)
Admission: EM | Admit: 2020-06-18 | Discharge: 2020-06-19 | Disposition: A | Discharge status: Home / Self Care | Payer: 59 | Attending: Surgery | Admitting: Surgery

## 2020-06-18 ENCOUNTER — Encounter (HOSPITAL_COMMUNITY): Payer: Self-pay

## 2020-06-18 ENCOUNTER — Emergency Department (HOSPITAL_COMMUNITY): Payer: 59

## 2020-06-18 DIAGNOSIS — Z20822 Contact with and (suspected) exposure to covid-19: Secondary | ICD-10-CM | POA: Diagnosis not present

## 2020-06-18 DIAGNOSIS — L02214 Cutaneous abscess of groin: Secondary | ICD-10-CM | POA: Diagnosis not present

## 2020-06-18 DIAGNOSIS — Z87891 Personal history of nicotine dependence: Secondary | ICD-10-CM | POA: Insufficient documentation

## 2020-06-18 DIAGNOSIS — L0390 Cellulitis, unspecified: Secondary | ICD-10-CM

## 2020-06-18 DIAGNOSIS — Z794 Long term (current) use of insulin: Secondary | ICD-10-CM | POA: Diagnosis not present

## 2020-06-18 DIAGNOSIS — E119 Type 2 diabetes mellitus without complications: Secondary | ICD-10-CM | POA: Insufficient documentation

## 2020-06-18 DIAGNOSIS — Z7984 Long term (current) use of oral hypoglycemic drugs: Secondary | ICD-10-CM | POA: Insufficient documentation

## 2020-06-18 DIAGNOSIS — Z79899 Other long term (current) drug therapy: Secondary | ICD-10-CM | POA: Diagnosis not present

## 2020-06-18 DIAGNOSIS — I10 Essential (primary) hypertension: Secondary | ICD-10-CM | POA: Insufficient documentation

## 2020-06-18 LAB — CBC WITH DIFFERENTIAL/PLATELET
Abs Immature Granulocytes: 0.15 10*3/uL — ABNORMAL HIGH (ref 0.00–0.07)
Basophils Absolute: 0.1 10*3/uL (ref 0.0–0.1)
Basophils Relative: 1 %
Eosinophils Absolute: 0.2 10*3/uL (ref 0.0–0.5)
Eosinophils Relative: 1 %
HCT: 38.3 % — ABNORMAL LOW (ref 39.0–52.0)
Hemoglobin: 13.1 g/dL (ref 13.0–17.0)
Immature Granulocytes: 1 %
Lymphocytes Relative: 21 %
Lymphs Abs: 3.5 10*3/uL (ref 0.7–4.0)
MCH: 29.7 pg (ref 26.0–34.0)
MCHC: 34.2 g/dL (ref 30.0–36.0)
MCV: 86.8 fL (ref 80.0–100.0)
Monocytes Absolute: 1.1 10*3/uL — ABNORMAL HIGH (ref 0.1–1.0)
Monocytes Relative: 7 %
Neutro Abs: 12 10*3/uL — ABNORMAL HIGH (ref 1.7–7.7)
Neutrophils Relative %: 69 %
Platelets: 406 10*3/uL — ABNORMAL HIGH (ref 150–400)
RBC: 4.41 MIL/uL (ref 4.22–5.81)
RDW: 12.2 % (ref 11.5–15.5)
WBC: 17 10*3/uL — ABNORMAL HIGH (ref 4.0–10.5)
nRBC: 0 % (ref 0.0–0.2)

## 2020-06-18 LAB — BASIC METABOLIC PANEL
Anion gap: 7 (ref 5–15)
BUN: 13 mg/dL (ref 6–20)
CO2: 28 mmol/L (ref 22–32)
Calcium: 9.2 mg/dL (ref 8.9–10.3)
Chloride: 101 mmol/L (ref 98–111)
Creatinine, Ser: 0.78 mg/dL (ref 0.61–1.24)
GFR, Estimated: >60 mL/min (ref >60)
Glucose, Bld: 146 mg/dL — ABNORMAL HIGH (ref 70–99)
Potassium: 3.8 mmol/L (ref 3.5–5.1)
Sodium: 136 mmol/L (ref 135–145)

## 2020-06-18 LAB — RESP PANEL BY RT-PCR (FLU A&B, COVID) ARPGX2
Influenza A by PCR: NEGATIVE
Influenza B by PCR: NEGATIVE
SARS Coronavirus 2 by RT PCR: NEGATIVE

## 2020-06-18 LAB — GLUCOSE, CAPILLARY
Glucose-Capillary: 82 mg/dL (ref 70–99)
Glucose-Capillary: 163 mg/dL — ABNORMAL HIGH (ref 70–99)

## 2020-06-18 LAB — LACTIC ACID, PLASMA: Lactic Acid, Venous: 0.7 mmol/L (ref 0.5–1.9)

## 2020-06-18 LAB — HIV ANTIBODY (ROUTINE TESTING W REFLEX): HIV Screen 4th Generation wRfx: NONREACTIVE

## 2020-06-18 MED ORDER — ONDANSETRON 4 MG PO TBDP: 4.0000 mg | ORAL_TABLET | Freq: Four times a day (QID) | ORAL | Status: DC | PRN

## 2020-06-18 MED ORDER — DIPHENHYDRAMINE HCL 25 MG PO CAPS: 25.0000 mg | ORAL_CAPSULE | Freq: Four times a day (QID) | ORAL | Status: DC | PRN

## 2020-06-18 MED ORDER — DIPHENHYDRAMINE HCL 50 MG/ML IJ SOLN: 25.0000 mg | Freq: Four times a day (QID) | INTRAMUSCULAR | Status: DC | PRN

## 2020-06-18 MED ORDER — METHOCARBAMOL 500 MG PO TABS
500.0000 mg | ORAL_TABLET | Freq: Four times a day (QID) | ORAL | Status: DC | PRN
  Administered 2020-06-18 – 2020-06-19 (×2): 500 mg via ORAL
  Filled 2020-06-18 (×2): qty 1

## 2020-06-18 MED ORDER — ONDANSETRON HCL 4 MG/2ML IJ SOLN: 4.0000 mg | Freq: Four times a day (QID) | INTRAMUSCULAR | Status: DC | PRN

## 2020-06-18 MED ORDER — PANTOPRAZOLE SODIUM 40 MG IV SOLR
40.0000 mg | Freq: Every day | INTRAVENOUS | Status: DC
  Administered 2020-06-18: 40 mg via INTRAVENOUS
  Filled 2020-06-18: qty 40

## 2020-06-18 MED ORDER — VANCOMYCIN HCL 2000 MG/400ML IV SOLN
2000.0000 mg | Freq: Once | INTRAVENOUS | Status: AC
  Administered 2020-06-18: 2000 mg via INTRAVENOUS
  Filled 2020-06-18: qty 400

## 2020-06-18 MED ORDER — MORPHINE SULFATE (PF) 2 MG/ML IV SOLN
2.0000 mg | INTRAVENOUS | Status: DC | PRN
Start: 2020-06-18 — End: 2020-06-19

## 2020-06-18 MED ORDER — CHLORHEXIDINE GLUCONATE CLOTH 2 % EX PADS: 6.0000 | MEDICATED_PAD | Freq: Once | CUTANEOUS | Status: DC

## 2020-06-18 MED ORDER — POLYETHYLENE GLYCOL 3350 17 G PO PACK: 17.0000 g | PACK | Freq: Every day | ORAL | Status: DC | PRN

## 2020-06-18 MED ORDER — VANCOMYCIN HCL 1500 MG/300ML IV SOLN
1500.0000 mg | Freq: Two times a day (BID) | INTRAVENOUS | Status: DC
  Administered 2020-06-19: 1500 mg via INTRAVENOUS
  Filled 2020-06-18: qty 300

## 2020-06-18 MED ORDER — OXYCODONE HCL 5 MG PO TABS: 5.0000 mg | ORAL_TABLET | ORAL | Status: DC | PRN

## 2020-06-18 MED ORDER — METOPROLOL TARTRATE 5 MG/5ML IV SOLN: 5.0000 mg | Freq: Four times a day (QID) | INTRAVENOUS | Status: DC | PRN

## 2020-06-18 MED ORDER — SIMETHICONE 80 MG PO CHEW: 40.0000 mg | CHEWABLE_TABLET | Freq: Four times a day (QID) | ORAL | Status: DC | PRN

## 2020-06-18 MED ORDER — INSULIN ASPART 100 UNIT/ML IJ SOLN: 0.0000 [IU] | Freq: Three times a day (TID) | INTRAMUSCULAR | Status: DC

## 2020-06-18 MED ORDER — ENOXAPARIN SODIUM 60 MG/0.6ML IJ SOSY
0.5000 mg/kg | PREFILLED_SYRINGE | INTRAMUSCULAR | Status: DC
  Filled 2020-06-18: qty 0.6

## 2020-06-18 MED ORDER — ACETAMINOPHEN 650 MG RE SUPP: 650.0000 mg | Freq: Four times a day (QID) | RECTAL | Status: DC | PRN

## 2020-06-18 MED ORDER — INSULIN ASPART 100 UNIT/ML IJ SOLN: 0.0000 [IU] | Freq: Every day | INTRAMUSCULAR | Status: DC

## 2020-06-18 MED ORDER — CHLORHEXIDINE GLUCONATE CLOTH 2 % EX PADS
6.0000 | MEDICATED_PAD | Freq: Once | CUTANEOUS | Status: AC
  Administered 2020-06-19: 6 via TOPICAL

## 2020-06-18 MED ORDER — METOPROLOL TARTRATE 5 MG/5ML IV SOLN
5.0000 mg | Freq: Four times a day (QID) | INTRAVENOUS | Status: DC | PRN
Start: 2020-06-18 — End: 2020-06-18

## 2020-06-18 MED ORDER — ACETAMINOPHEN 325 MG PO TABS
650.0000 mg | ORAL_TABLET | Freq: Four times a day (QID) | ORAL | Status: DC | PRN
  Administered 2020-06-19: 650 mg via ORAL
  Filled 2020-06-18: qty 2

## 2020-06-18 MED ORDER — DOCUSATE SODIUM 100 MG PO CAPS
100.0000 mg | ORAL_CAPSULE | Freq: Two times a day (BID) | ORAL | Status: DC
  Administered 2020-06-18 – 2020-06-19 (×2): 100 mg via ORAL
  Filled 2020-06-18 (×2): qty 1

## 2020-06-18 NOTE — ED Triage Notes (Signed)
Patient reports that he was seen last week for an abscess in the right groin. Patient states he was prescribed an antibiotic and now the abscess has come up above the one from last week. patient states he has been hospitalized twice for abscesses.

## 2020-06-18 NOTE — Progress Notes (Signed)
Pharmacy Antibiotic Note  Shaun Cummings is a 45 y.o. male admitted on 06/18/2020 with right groin abscess, plan for I&D in AM.  Pharmacy has been consulted for vancomycin dosing.  Plan: Vancomycin 2g IV x 1, then '1500mg'$  IV q12h for estimated AUC 470 using SCr rounded to 0.8 Consider checking levels at steady state, goal 400-550 Follow up renal function & cultures  Height: 6' (182.9 cm) Weight: 112 kg (247 lb) IBW/kg (Calculated) : 77.6  Temp (24hrs), Avg:98.2 F (36.8 C), Min:98 F (36.7 C), Max:98.3 F (36.8 C)  Recent Labs  Lab 06/18/20 1226  WBC 17.0*  CREATININE 0.78  LATICACIDVEN 0.7    Estimated Creatinine Clearance: 155.5 mL/min (by C-G formula based on SCr of 0.78 mg/dL).    Allergies  Allergen Reactions  . Augmentin [Amoxicillin-Pot Clavulanate] Hives and Itching    Has patient had a PCN reaction causing immediate rash, facial/tongue/throat swelling, SOB or lightheadedness with hypotension: Yes Has patient had a PCN reaction causing severe rash involving mucus membranes or skin necrosis: No Has patient had a PCN reaction that required hospitalization: No Has patient had a PCN reaction occurring within the last 10 years: No If all of the above answers are "NO", then may proceed with Cephalosporin use.  . Contrast Media [Iodinated Diagnostic Agents] Hives    Pt broke out with 1 hive on forehead after CT injection-treated with '25mg'$  Benadryl    Antimicrobials this admission: 5/24 Vancomycin >>  Dose adjustments this admission:  Microbiology results:  Thank you for allowing pharmacy to be a part of this patient's care.  Peggyann Juba, PharmD, BCPS Pharmacy: [phone] 06/18/2020 4:29 PM

## 2020-06-18 NOTE — H&P (Signed)
[facility] Admission Note  Jamarius Saha 1975/05/30  [id number].    Requesting MD: Davonna Belling Chief Complaint/Reason for Consult: right groin abscess  HPI:  Shaun Cummings is a 45yo male PMH DM, HTN, HLD, OSA, and obesity BMI 33.50 who presented to [facility] earlier today complaining of persistent right groin pain. He was in the ED 8 days ago where he was found to have a right groin abscess. He underwent bedside I&D in the ED, procedure note reports moderate purulent and bloody drainage. He was discharged home with doxycycline. States that since that time he has had worsening pain/swelling in this area. He has had no further drainage. Denies fever, chills, abdominal pain, nausea, vomiting.  Today he has a CT scan that shows interval progression of inflammatory changes within the right inguinal region compatible with cellulitis and a central area of phlegmon/abscess formation which measures 3.5 x 2.4 x 4.3 cm. WBC 17. General surgery asked to see.  Anticoagulants: none Nonsmoker  Review of Systems  Constitutional: Negative.   Respiratory: Negative.   Cardiovascular: Negative.   Gastrointestinal: Negative.   Genitourinary: Negative.   Skin:       Right groin abscess/pain    All systems reviewed and otherwise negative except for as above  Family History  Problem Relation Age of Onset  . Diabetes Mother   . Angina Mother   . CAD Mother   . Diabetes Father   . Heart attack Father   . Kidney failure Father   . Diabetes Sister   . Diabetes Brother   . Diabetes Maternal Grandmother   . Diabetes Maternal Grandfather   . Diabetes Paternal Grandmother   . Diabetes Paternal Grandfather     Past Medical History:  Diagnosis Date  . Hyperlipidemia   . Lesion of penis    foreskin  . OSA (obstructive sleep apnea)    per pt dx osa and used cpap but after losing wt. from 415 pounds down to 244 pounds no longer needs cpap  . Peripheral neuropathy   . Phimosis   .  Scrotal abscess 05/08/2017  . Type 2 diabetes mellitus (HCC)    per pt no meds for two years pt was changed to levemir unable to afford but pt states is waiting on approval for a program he applied for that will pay for his meds [employer])      Past Surgical History:  Procedure Laterality Date  . CIRCUMCISION N/A 02/10/2016   Procedure: CIRCUMCISION ADULT;  Surgeon: Cleon Gustin, MD;  Location: [facility];  Service: Urology;  Laterality: N/A;  . INCISION AND DRAINAGE ABSCESS Right 05/12/2017   Procedure: INCISION AND DRAINAGE RIGHT INGUINAL ABSCESS;  Surgeon: Erroll Luna, MD;  Location: [place];  Service: General;  Laterality: Right;  . INCISION AND DRAINAGE ABSCESS N/A 03/24/2018   Procedure: INCISION AND DRAINAGE POSTERIOR NECK ABSCESS;  Surgeon: Excell Seltzer, MD;  Location: [place];  Service: General;  Laterality: N/A;  . INCISION AND DRAINAGE PERIRECTAL ABSCESS Right 05/09/2017   Procedure: IRRIGATION AND DEBRIDEMENT PERINEAL ABSCESS;  Surgeon: Donnie Mesa, MD;  Location: [place];  Service: General;  Laterality: Right;  . NO PAST SURGERIES      Social History:  reports that he quit smoking about 12 years ago. His smoking use included cigarettes. He quit after 2.00 years of use. He has never used smokeless tobacco. He reports that he does not drink alcohol and does not use drugs.  Allergies:  Allergies  Allergen Reactions  . Augmentin [Amoxicillin-Pot Clavulanate] Hives and Itching    Has patient had a PCN reaction causing immediate rash, facial/tongue/throat swelling, SOB or lightheadedness with hypotension: Yes Has patient had a PCN reaction causing severe rash involving mucus membranes or skin necrosis: No Has patient had a PCN reaction that required hospitalization: No Has patient had a PCN reaction occurring within the last 10 years: No If all of the above answers are "NO", then may proceed with Cephalosporin use.  . Contrast Media [Iodinated  Diagnostic Agents] Hives    Pt broke out with 1 hive on forehead after CT injection-treated with 24m Benadryl    (Not in a hospital admission)   Prior to Admission medications   Medication Sig Start Date End Date Taking? Authorizing Provider  amLODipine (NORVASC) 5 MG tablet Take 5 mg by mouth daily. 04/30/20   [provider]  Blood Glucose Monitoring Suppl (CONTOUR NEXT MONITOR) w/Device KIT 30 Units by Does not apply route at bedtime. 03/09/18   HKathi Ludwig MD  doxycycline (VIBRAMYCIN) 100 MG capsule Take 1 capsule (100 mg total) by mouth 2 (two) times daily. 06/10/20   ALacretia Leigh MD  gabapentin (NEURONTIN) 300 MG capsule Take 1 capsule (300 mg total) by mouth at bedtime as needed. Patient not taking: No sig reported 03/09/18   HKathi Ludwig MD  glipiZIDE (GLUCOTROL XL) 10 MG 24 hr tablet Take 10 mg by mouth every morning. 04/25/20   [provider]  glucose blood (CONTOUR NEXT TEST) test strip Use as instructed 03/09/18   HKathi Ludwig MD  ibuprofen (ADVIL) 600 MG tablet Take 1 tablet (600 mg total) by mouth every 6 (six) hours as needed. 02/05/19   Bast, TTressia MinersA, NP  insulin aspart (NOVOLOG) 100 UNIT/ML FlexPen Inject 9 Units into the skin 3 (three) times daily with meals. 03/25/18   WKatherine Roan MD  Insulin Glargine (LANTUS SOLOSTAR) 100 UNIT/ML Solostar Pen Inject 30 Units into the skin daily at 10 pm. 03/09/18   HKathi Ludwig MD  Insulin Pen Needle (PEN NEEDLES) 32G X 4 MM MISC 1 Bag by Does not apply route daily. 03/25/18   WKatherine Roan MD  metFORMIN (GLUCOPHAGE-XR) 500 MG 24 hr tablet Take 1 tablet (500 mg total) by mouth daily with breakfast. 03/09/18   HKathi Ludwig MD  NOVOLIN N RELION 100 UNIT/ML injection Inject 20-25 Units into the skin in the morning and at bedtime. 25 in the morning and 20 units at bedtime 04/25/20   [provider]  NOVOLIN R RELION 100 UNIT/ML injection Inject 9 Units into the skin in the  morning, at noon, and at bedtime. 02/27/20   [provider]    Blood pressure (!) 160/102, pulse 78, temperature 98.3 F (36.8 C), temperature source Oral, resp. rate 18, height 6' (1.829 m), weight 112 kg, SpO2 100 %. Physical Exam: General: pleasant, WD/WN male who is laying in bed in NAD HEENT: head is normocephalic, atraumatic.  Sclera are noninjected.  Pupils equal and round.  Ears and nose without any masses or lesions.  Mouth is pink and moist. Dentition fair Heart: regular, rate, and rhythm.  Normal s1,s2. No obvious murmurs, gallops, or rubs noted.  Palpable pedal pulses bilaterally  Lungs: CTAB, no wheezes, rhonchi, or rales noted.  Respiratory effort nonlabored Abd: soft, NT/ND, +BS, no masses, hernias, or organomegaly MS: no BUE/BLE edema, calves soft and nontender Psych: A&Ox4 with an appropriate affect Neuro: cranial nerves grossly intact, equal strength in BUE/BLE  bilaterally, normal speech, thought process intact Skin: warm and dry. Right groin abscess present with pinpoint opening, no active drainage, area proximal with induration and tenderness  Results for orders placed or performed during the hospital encounter of 06/18/20 (from the past 48 hour(s))  Basic metabolic panel     Status: Abnormal   Collection Time: 06/18/20 12:26 PM  Result Value Ref Range   Sodium 136 135 - 145 mmol/L   Potassium 3.8 3.5 - 5.1 mmol/L   Chloride 101 98 - 111 mmol/L   CO2 28 22 - 32 mmol/L   Glucose, Bld 146 (H) 70 - 99 mg/dL    Comment: Glucose reference range applies only to samples taken after fasting for at least 8 hours.   BUN 13 6 - 20 mg/dL   Creatinine, Ser 0.78 0.61 - 1.24 mg/dL   Calcium 9.2 8.9 - 10.3 mg/dL   GFR, Estimated >60 >60 mL/min    Comment: (NOTE) Calculated using the CKD-EPI Creatinine Equation (2021)    Anion gap 7 5 - 15    Comment: Performed at [facility], [place] [address] [place], [place] [address]  Lactic acid, plasma     Status:  None   Collection Time: 06/18/20 12:26 PM  Result Value Ref Range   Lactic Acid, Venous 0.7 0.5 - 1.9 mmol/L    Comment: Performed at [facility], [place] [address] [place], [place] [address]  CBC with Differential     Status: Abnormal   Collection Time: 06/18/20 12:26 PM  Result Value Ref Range   WBC 17.0 (H) 4.0 - 10.5 K/uL   RBC 4.41 4.22 - 5.81 MIL/uL   Hemoglobin 13.1 13.0 - 17.0 g/dL   HCT 38.3 (L) 39.0 - 52.0 %   MCV 86.8 80.0 - 100.0 fL   MCH 29.7 26.0 - 34.0 pg   MCHC 34.2 30.0 - 36.0 g/dL   RDW 12.2 11.5 - 15.5 %   Platelets 406 (H) 150 - 400 K/uL   nRBC 0.0 0.0 - 0.2 %   Neutrophils Relative % 69 %   Neutro Abs 12.0 (H) 1.7 - 7.7 K/uL   Lymphocytes Relative 21 %   Lymphs Abs 3.5 0.7 - 4.0 K/uL   Monocytes Relative 7 %   Monocytes Absolute 1.1 (H) 0.1 - 1.0 K/uL   Eosinophils Relative 1 %   Eosinophils Absolute 0.2 0.0 - 0.5 K/uL   Basophils Relative 1 %   Basophils Absolute 0.1 0.0 - 0.1 K/uL   Immature Granulocytes 1 %   Abs Immature Granulocytes 0.15 (H) 0.00 - 0.07 K/uL    Comment: Performed at [facility], [place] [address], [place], [place] [address]   CT ABDOMEN PELVIS WO CONTRAST  Result Date: 06/18/2020 CLINICAL DATA:  Follow-up groin abscess. EXAM: CT ABDOMEN AND PELVIS WITHOUT CONTRAST TECHNIQUE: Multidetector CT imaging of the abdomen and pelvis was performed following the standard protocol without IV contrast. COMPARISON:  06/10/2020 FINDINGS: Lower chest: No acute abnormality. Calcified granuloma noted in the left lower lobe. Hepatobiliary: No focal liver abnormality is seen. No gallstones, gallbladder wall thickening, or biliary dilatation. Pancreas: Unremarkable. No pancreatic ductal dilatation or surrounding inflammatory changes. Spleen: Normal in size without focal abnormality. Adrenals/Urinary Tract: Normal adrenal glands. The right kidney appears normal. Several punctate stones identified within the upper and mid left  kidney measuring up to 4 mm. No signs of hydronephrosis. The urinary bladder appears normal. Stomach/Bowel: Stomach is unremarkable. The appendix is visualized and  appears normal. No bowel wall thickening, inflammation, or distension. Vascular/Lymphatic: Aortic atherosclerosis. No abdominal adenopathy. Multiple prominent lymph nodes within the right inguinal region are likely reactive measuring up to 1.3 cm short axis, image 96/2. Reproductive: Prostate is unremarkable. Other: Skin thickening and diffuse subcutaneous soft tissue stranding identified within the right inguinal region. This has progressed when compared with the previous exam. There is a central area phlegmon/abscess formation which measures 3.5 by 2.4 by 4.3 cm, image 68/4. Compared with the previous exam this appears more organized and well-defined. On 06/10/2020 a subcutaneous area of phlegmon was reported measuring 3.3 x 2.3 by 3.8 cm. Musculoskeletal: No acute or significant osseous findings. IMPRESSION: 1. Interval progression of inflammatory changes within the right inguinal region compatible with cellulitis. There is a central area of phlegmon/abscess formation which measures 3.5 x 2.4 x 4.3 cm. This appears more organized and well-defined when compared with the previous exam. On 06/10/2020 a subcutaneous area of phlegmon was reported measuring 3.3 x 2.3 by 3.8 cm. 2. Nonobstructing left renal calculi. 3. Aortic atherosclerosis. Aortic Atherosclerosis (ICD10-I70.0). Electronically Signed   By: Kerby Moors M.D.   On: 06/18/2020 12:11      Assessment/Plan DM - SSI, check A1c HTN HLD OSA Obesity BMI 33.50  Right groin abscess - Will admit and plan for I&D in the OR tomorrow. Start IV vancomycin. [place] for diet today, NPO after midnight. Covid test is pending.   ID - vancomycin VTE - SCDs, lovenox FEN - CM diet, NPO after MN Foley - none Follow up - TBD   Wellington [place], [facility] Surgery 06/18/2020, 4:10  PM Please see Amion for pager number during day hours 7:00am-4:30pm

## 2020-06-18 NOTE — ED Provider Notes (Signed)
[facility] DEPT Provider Note   CSN: [id number] Arrival date & time: 06/18/20  1012     History Chief Complaint  Patient presents with  . Abscess    Shaun Cummings is a 45 y.o. male.  HPI Patient presents with right groin abscess.  Had been seen 8 days ago for the same.  Had incision and drainage done and had been on antibiotics.  States he has had some chills.  States that he was started on doxycycline.  Now states there is more pain in the abdomen and more swelling further up his right groin.  States it is not related to the scrotum.  States the pain comes and goes and is worse with palpation.  Has had previous abscesses that required admission to the hospital.    Past Medical History:  Diagnosis Date  . Hyperlipidemia   . Lesion of penis    foreskin  . OSA (obstructive sleep apnea)    per pt dx osa and used cpap but after losing wt. from 415 pounds down to 244 pounds no longer needs cpap  . Peripheral neuropathy   . Phimosis   . Scrotal abscess 05/08/2017  . Type 2 diabetes mellitus (HCC)    per pt no meds for two years pt was changed to levemir unable to afford but pt states is waiting on approval for a program he applied for that will pay for his meds [facility])      Patient Active Problem List   Diagnosis Date Noted  . Malnutrition of moderate degree 03/22/2018  . Cellulitis 03/20/2018  . Infected epithelial inclusion cyst 03/14/2018  . Hypertension 03/09/2018  . Peripheral neuropathy 05/21/2017  . Hyperlipidemia associated with type 2 diabetes mellitus ([place]) 09/18/2013  . Obesity (BMI 30-39.9) 09/18/2013  . Diabetes ([place]) 09/15/2013    Past Surgical History:  Procedure Laterality Date  . CIRCUMCISION N/A 02/10/2016   Procedure: CIRCUMCISION ADULT;  Surgeon: Cleon Gustin, MD;  Location: [facility];  Service: Urology;  Laterality: N/A;  . INCISION AND DRAINAGE ABSCESS Right 05/12/2017   Procedure:  INCISION AND DRAINAGE RIGHT INGUINAL ABSCESS;  Surgeon: Erroll Luna, MD;  Location: [place];  Service: General;  Laterality: Right;  . INCISION AND DRAINAGE ABSCESS N/A 03/24/2018   Procedure: INCISION AND DRAINAGE POSTERIOR NECK ABSCESS;  Surgeon: Excell Seltzer, MD;  Location: [place];  Service: General;  Laterality: N/A;  . INCISION AND DRAINAGE PERIRECTAL ABSCESS Right 05/09/2017   Procedure: IRRIGATION AND DEBRIDEMENT PERINEAL ABSCESS;  Surgeon: Donnie Mesa, MD;  Location: [place];  Service: General;  Laterality: Right;  . NO PAST SURGERIES         Family History  Problem Relation Age of Onset  . Diabetes Mother   . Angina Mother   . CAD Mother   . Diabetes Father   . Heart attack Father   . Kidney failure Father   . Diabetes Sister   . Diabetes Brother   . Diabetes Maternal Grandmother   . Diabetes Maternal Grandfather   . Diabetes Paternal Grandmother   . Diabetes Paternal Grandfather     Social History   Tobacco Use  . Smoking status: Former Smoker    Years: 2.00    Types: Cigarettes    Quit date: 02/05/2008    Years since quitting: 12.3  . Smokeless tobacco: Never Used  Vaping Use  . Vaping Use: Never used  Substance Use Topics  . Alcohol use: No  . Drug  use: No    Home Medications Prior to Admission medications   Medication Sig Start Date End Date Taking? Authorizing Provider  Blood Glucose Monitoring Suppl (CONTOUR NEXT MONITOR) w/Device KIT 30 Units by Does not apply route at bedtime. 03/09/18   Kathi Ludwig, MD  doxycycline (VIBRAMYCIN) 100 MG capsule Take 1 capsule (100 mg total) by mouth 2 (two) times daily. 06/10/20   Lacretia Leigh, MD  gabapentin (NEURONTIN) 300 MG capsule Take 1 capsule (300 mg total) by mouth at bedtime as needed. Patient not taking: No sig reported 03/09/18   Kathi Ludwig, MD  glipiZIDE (GLUCOTROL XL) 10 MG 24 hr tablet Take 10 mg by mouth every morning. 04/25/20   [provider]  glucose blood (CONTOUR NEXT  TEST) test strip Use as instructed Patient not taking: Reported on 09/08/2019 03/09/18   Kathi Ludwig, MD  ibuprofen (ADVIL) 600 MG tablet Take 1 tablet (600 mg total) by mouth every 6 (six) hours as needed. Patient not taking: No sig reported 02/05/19   Loura Halt A, NP  insulin aspart (NOVOLOG) 100 UNIT/ML FlexPen Inject 9 Units into the skin 3 (three) times daily with meals. 03/25/18   Katherine Roan, MD  Insulin Glargine (LANTUS SOLOSTAR) 100 UNIT/ML Solostar Pen Inject 30 Units into the skin daily at 10 pm. Patient not taking: No sig reported 03/09/18   Kathi Ludwig, MD  Insulin Pen Needle (PEN NEEDLES) 32G X 4 MM MISC 1 Bag by Does not apply route daily. 03/25/18   Katherine Roan, MD  metFORMIN (GLUCOPHAGE-XR) 500 MG 24 hr tablet Take 1 tablet (500 mg total) by mouth daily with breakfast. Patient not taking: No sig reported 03/09/18   Kathi Ludwig, MD  NOVOLIN N RELION 100 UNIT/ML injection Inject 20-25 Units into the skin in the morning and at bedtime. 25 in the morning and 20 units at bedtime 04/25/20   [provider]    Allergies    Augmentin [amoxicillin-pot clavulanate] and Contrast media [iodinated diagnostic agents]  Review of Systems   Review of Systems  Constitutional: Negative for appetite change.  HENT: Negative for congestion.   Respiratory: Negative for shortness of breath.   Gastrointestinal: Positive for abdominal pain. Negative for constipation.  Genitourinary: Negative for flank pain.  Musculoskeletal: Negative for back pain.  Skin: Positive for wound.  Neurological: Negative for weakness.  Psychiatric/Behavioral: Negative for confusion.    Physical Exam Updated Vital Signs BP (!) 170/102 (BP Location: Left Arm)   Pulse 81   Temp 98 F (36.7 C) (Oral)   Resp 17   Ht 6' (1.829 m)   Wt 112 kg   SpO2 98%   BMI 33.50 kg/m   Physical Exam Vitals and nursing note reviewed.  HENT:     Head: Atraumatic.  Eyes:     General:  No scleral icterus.    Pupils: Pupils are equal, round, and reactive to light.  Abdominal:     Comments: Some lower abdominal tenderness.  Worse on right side.  Musculoskeletal:        General: No tenderness.     Cervical back: Neck supple.  Skin:    Capillary Refill: Capillary refill takes less than 2 seconds.     Comments: Right groin has site of previous abscess.  Small central area where incision was done.  Proximal to this there is a tender indurated area approximately 4 cm across.  Some swelling.  No fluctuance.  There is some skin changes go up onto the  abdomen also.  No testicular or scrotal involvement.  Neurological:     Mental Status: He is alert and oriented to person, place, and time.     ED Results / Procedures / Treatments   Labs (all labs ordered are listed, but only abnormal results are displayed) Labs Reviewed  BASIC METABOLIC PANEL - Abnormal; Notable for the following components:      Result Value   Glucose, Bld 146 (*)    All other components within normal limits  CBC WITH DIFFERENTIAL/PLATELET - Abnormal; Notable for the following components:   WBC 17.0 (*)    HCT 38.3 (*)    Platelets 406 (*)    Neutro Abs 12.0 (*)    Monocytes Absolute 1.1 (*)    Abs Immature Granulocytes 0.15 (*)    All other components within normal limits  RESP PANEL BY RT-PCR (FLU A&B, COVID) [id number]  LACTIC ACID, PLASMA  LACTIC ACID, PLASMA    EKG None  Radiology CT ABDOMEN PELVIS WO CONTRAST  Result Date: 06/18/2020 CLINICAL DATA:  Follow-up groin abscess. EXAM: CT ABDOMEN AND PELVIS WITHOUT CONTRAST TECHNIQUE: Multidetector CT imaging of the abdomen and pelvis was performed following the standard protocol without IV contrast. COMPARISON:  06/10/2020 FINDINGS: Lower chest: No acute abnormality. Calcified granuloma noted in the left lower lobe. Hepatobiliary: No focal liver abnormality is seen. No gallstones, gallbladder wall thickening, or biliary dilatation. Pancreas:  Unremarkable. No pancreatic ductal dilatation or surrounding inflammatory changes. Spleen: Normal in size without focal abnormality. Adrenals/Urinary Tract: Normal adrenal glands. The right kidney appears normal. Several punctate stones identified within the upper and mid left kidney measuring up to 4 mm. No signs of hydronephrosis. The urinary bladder appears normal. Stomach/Bowel: Stomach is unremarkable. The appendix is visualized and appears normal. No bowel wall thickening, inflammation, or distension. Vascular/Lymphatic: Aortic atherosclerosis. No abdominal adenopathy. Multiple prominent lymph nodes within the right inguinal region are likely reactive measuring up to 1.3 cm short axis, image 96/2. Reproductive: Prostate is unremarkable. Other: Skin thickening and diffuse subcutaneous soft tissue stranding identified within the right inguinal region. This has progressed when compared with the previous exam. There is a central area phlegmon/abscess formation which measures 3.5 by 2.4 by 4.3 cm, image 68/4. Compared with the previous exam this appears more organized and well-defined. On 06/10/2020 a subcutaneous area of phlegmon was reported measuring 3.3 x 2.3 by 3.8 cm. Musculoskeletal: No acute or significant osseous findings. IMPRESSION: 1. Interval progression of inflammatory changes within the right inguinal region compatible with cellulitis. There is a central area of phlegmon/abscess formation which measures 3.5 x 2.4 x 4.3 cm. This appears more organized and well-defined when compared with the previous exam. On 06/10/2020 a subcutaneous area of phlegmon was reported measuring 3.3 x 2.3 by 3.8 cm. 2. Nonobstructing left renal calculi. 3. Aortic atherosclerosis. Aortic Atherosclerosis (ICD10-I70.0). Electronically Signed   By: Kerby Moors M.D.   On: 06/18/2020 12:11    Procedures Procedures   Medications Ordered in ED Medications - No data to display  ED Course  I have reviewed the triage  vital signs and the nursing notes.  Pertinent labs & imaging results that were available during my care of the patient were reviewed by me and considered in my medical decision making (see chart for details).    MDM Rules/Calculators/A&P                          Patient with right-sided groin abscess/cellulitis.  States since being seen in the ER for I&D about 8 days ago it has worsened.  Has had chills without fever.  Is diabetic.  Has been on doxycycline.  White count is improved from 20-17.  States he had some sort of abnormality where his white count is always elevated however he does have previous WBCs in our system and it appears as if he will go down to 9 or may be baseline is around 12.  There is a 4 cm phlegmon/abscess in right groin with surrounding cellulitis.  Will discuss with general surgery to see if it is something they would want to surgically intervene on.  Otherwise potentially would require admission to the hospital. It appears as if surgery is going to admit patient. Final Clinical Impression(s) / ED Diagnoses Final diagnoses:  Abscess of groin, right  Cellulitis, unspecified cellulitis site    Rx / DC Orders ED Discharge Orders    None       Davonna Belling, MD 06/18/20 [phone]

## 2020-06-19 ENCOUNTER — Observation Stay (HOSPITAL_COMMUNITY): Payer: 59 | Admitting: Anesthesiology

## 2020-06-19 ENCOUNTER — Encounter (HOSPITAL_COMMUNITY)
Admission: EM | Disposition: A | Discharge status: Home / Self Care | Payer: Self-pay | Source: Home / Self Care | Attending: Emergency Medicine

## 2020-06-19 ENCOUNTER — Encounter (HOSPITAL_COMMUNITY): Payer: Self-pay

## 2020-06-19 DIAGNOSIS — Z20822 Contact with and (suspected) exposure to covid-19: Secondary | ICD-10-CM | POA: Diagnosis not present

## 2020-06-19 DIAGNOSIS — E119 Type 2 diabetes mellitus without complications: Secondary | ICD-10-CM | POA: Diagnosis not present

## 2020-06-19 DIAGNOSIS — I10 Essential (primary) hypertension: Secondary | ICD-10-CM | POA: Diagnosis not present

## 2020-06-19 DIAGNOSIS — L02214 Cutaneous abscess of groin: Secondary | ICD-10-CM | POA: Diagnosis not present

## 2020-06-19 HISTORY — PX: INCISION AND DRAINAGE ABSCESS: SHX5864

## 2020-06-19 LAB — BASIC METABOLIC PANEL
Anion gap: 8 (ref 5–15)
BUN: 12 mg/dL (ref 6–20)
CO2: 26 mmol/L (ref 22–32)
Calcium: 8.6 mg/dL — ABNORMAL LOW (ref 8.9–10.3)
Chloride: 105 mmol/L (ref 98–111)
Creatinine, Ser: 0.77 mg/dL (ref 0.61–1.24)
GFR, Estimated: >60 mL/min (ref >60)
Glucose, Bld: 110 mg/dL — ABNORMAL HIGH (ref 70–99)
Potassium: 3.3 mmol/L — ABNORMAL LOW (ref 3.5–5.1)
Sodium: 139 mmol/L (ref 135–145)

## 2020-06-19 LAB — CBC
HCT: 36 % — ABNORMAL LOW (ref 39.0–52.0)
Hemoglobin: 12.2 g/dL — ABNORMAL LOW (ref 13.0–17.0)
MCH: 29.5 pg (ref 26.0–34.0)
MCHC: 33.9 g/dL (ref 30.0–36.0)
MCV: 87.2 fL (ref 80.0–100.0)
Platelets: 350 10*3/uL (ref 150–400)
RBC: 4.13 MIL/uL — ABNORMAL LOW (ref 4.22–5.81)
RDW: 12.3 % (ref 11.5–15.5)
WBC: 15.5 10*3/uL — ABNORMAL HIGH (ref 4.0–10.5)
nRBC: 0 % (ref 0.0–0.2)

## 2020-06-19 LAB — GLUCOSE, CAPILLARY
Glucose-Capillary: 132 mg/dL — ABNORMAL HIGH (ref 70–99)
Glucose-Capillary: 156 mg/dL — ABNORMAL HIGH (ref 70–99)
Glucose-Capillary: 155 mg/dL — ABNORMAL HIGH (ref 70–99)

## 2020-06-19 LAB — HEMOGLOBIN A1C
Hgb A1c MFr Bld: 10 % — ABNORMAL HIGH (ref 4.8–5.6)
Mean Plasma Glucose: 240 mg/dL

## 2020-06-19 LAB — SURGICAL PCR SCREEN
MRSA, PCR: POSITIVE — AB
Staphylococcus aureus: POSITIVE — AB

## 2020-06-19 LAB — MAGNESIUM: Magnesium: 1.9 mg/dL (ref 1.7–2.4)

## 2020-06-19 SURGERY — INCISION AND DRAINAGE, ABSCESS
Anesthesia: General | Laterality: Right

## 2020-06-19 MED ORDER — POTASSIUM CHLORIDE 10 MEQ/100ML IV SOLN
10.0000 meq | INTRAVENOUS | Status: AC
  Administered 2020-06-19: 10 meq via INTRAVENOUS
  Filled 2020-06-19: qty 100

## 2020-06-19 MED ORDER — LABETALOL HCL 5 MG/ML IV SOLN
INTRAVENOUS | Status: DC | PRN
  Administered 2020-06-19: 5 mg via INTRAVENOUS

## 2020-06-19 MED ORDER — OXYCODONE HCL 5 MG/5ML PO SOLN: 5.0000 mg | Freq: Once | ORAL | Status: DC | PRN

## 2020-06-19 MED ORDER — HYDROGEN PEROXIDE 3 % EX SOLN
CUTANEOUS | Status: AC
  Filled 2020-06-19: qty 473

## 2020-06-19 MED ORDER — CHLORHEXIDINE GLUCONATE CLOTH 2 % EX PADS: 6.0000 | MEDICATED_PAD | Freq: Every day | CUTANEOUS | Status: DC

## 2020-06-19 MED ORDER — ROCURONIUM BROMIDE 10 MG/ML (PF) SYRINGE
PREFILLED_SYRINGE | INTRAVENOUS | Status: AC
  Filled 2020-06-19: qty 10

## 2020-06-19 MED ORDER — PROMETHAZINE HCL 25 MG/ML IJ SOLN: 6.2500 mg | INTRAMUSCULAR | Status: DC | PRN

## 2020-06-19 MED ORDER — LIDOCAINE HCL 2 % IJ SOLN
INTRAMUSCULAR | Status: AC
  Filled 2020-06-19: qty 20

## 2020-06-19 MED ORDER — VANCOMYCIN HCL 1000 MG IV SOLR
1000.0000 mg | Freq: Two times a day (BID) | INTRAVENOUS | Status: DC
  Administered 2020-06-19: 1000 mg via INTRAVENOUS
  Filled 2020-06-19 (×4): qty 1000

## 2020-06-19 MED ORDER — AMLODIPINE BESYLATE 5 MG PO TABS
5.0000 mg | ORAL_TABLET | Freq: Every day | ORAL | Status: DC
  Filled 2020-06-19: qty 1

## 2020-06-19 MED ORDER — DEXAMETHASONE SODIUM PHOSPHATE 10 MG/ML IJ SOLN
INTRAMUSCULAR | Status: AC
  Filled 2020-06-19: qty 1

## 2020-06-19 MED ORDER — MORPHINE SULFATE (PF) 2 MG/ML IV SOLN
2.0000 mg | INTRAVENOUS | Status: DC | PRN
Start: 2020-06-19 — End: 2020-06-19

## 2020-06-19 MED ORDER — OXYCODONE HCL 5 MG PO TABS: 5.0000 mg | ORAL_TABLET | ORAL | Status: DC | PRN

## 2020-06-19 MED ORDER — FENTANYL CITRATE (PF) 100 MCG/2ML IJ SOLN
INTRAMUSCULAR | Status: DC | PRN
  Administered 2020-06-19: 50 ug via INTRAVENOUS
  Administered 2020-06-19: 100 ug via INTRAVENOUS

## 2020-06-19 MED ORDER — PROPOFOL 10 MG/ML IV BOLUS
INTRAVENOUS | Status: DC | PRN
  Administered 2020-06-19: 200 mg via INTRAVENOUS

## 2020-06-19 MED ORDER — SODIUM CHLORIDE 0.9 % IV SOLN: INTRAVENOUS | Status: DC | PRN

## 2020-06-19 MED ORDER — FENTANYL CITRATE (PF) 250 MCG/5ML IJ SOLN
INTRAMUSCULAR | Status: AC
  Filled 2020-06-19: qty 5

## 2020-06-19 MED ORDER — LIDOCAINE 2% (20 MG/ML) 5 ML SYRINGE
INTRAMUSCULAR | Status: AC
  Filled 2020-06-19: qty 5

## 2020-06-19 MED ORDER — OXYCODONE HCL 5 MG PO TABS: 5.0000 mg | ORAL_TABLET | Freq: Once | ORAL | Status: DC | PRN

## 2020-06-19 MED ORDER — MUPIROCIN 2 % EX OINT: 1.0000 "application " | TOPICAL_OINTMENT | Freq: Two times a day (BID) | CUTANEOUS | Status: DC

## 2020-06-19 MED ORDER — MIDAZOLAM HCL 2 MG/2ML IJ SOLN: 0.5000 mg | Freq: Once | INTRAMUSCULAR | Status: DC | PRN

## 2020-06-19 MED ORDER — MEPERIDINE HCL 50 MG/ML IJ SOLN: 6.2500 mg | INTRAMUSCULAR | Status: DC | PRN

## 2020-06-19 MED ORDER — VANCOMYCIN HCL 1500 MG/300ML IV SOLN
1500.0000 mg | Freq: Two times a day (BID) | INTRAVENOUS | Status: DC
  Filled 2020-06-19: qty 300

## 2020-06-19 MED ORDER — HYDROMORPHONE HCL 1 MG/ML IJ SOLN
0.2500 mg | INTRAMUSCULAR | Status: DC | PRN
  Administered 2020-06-19: 0.5 mg via INTRAVENOUS

## 2020-06-19 MED ORDER — PROPOFOL 10 MG/ML IV BOLUS
INTRAVENOUS | Status: AC
  Filled 2020-06-19: qty 20

## 2020-06-19 MED ORDER — LIDOCAINE 2% (20 MG/ML) 5 ML SYRINGE
INTRAMUSCULAR | Status: DC | PRN
  Administered 2020-06-19: 100 mg via INTRAVENOUS

## 2020-06-19 MED ORDER — 0.9 % SODIUM CHLORIDE (POUR BTL) OPTIME
TOPICAL | Status: DC | PRN
  Administered 2020-06-19: 1000 mL

## 2020-06-19 MED ORDER — SUGAMMADEX SODIUM 500 MG/5ML IV SOLN
INTRAVENOUS | Status: AC
  Filled 2020-06-19: qty 5

## 2020-06-19 MED ORDER — ONDANSETRON HCL 4 MG/2ML IJ SOLN
INTRAMUSCULAR | Status: DC | PRN
  Administered 2020-06-19: 4 mg via INTRAVENOUS

## 2020-06-19 MED ORDER — DOXYCYCLINE HYCLATE 100 MG PO CAPS: 100.0000 mg | ORAL_CAPSULE | Freq: Two times a day (BID) | ORAL | 0 refills | Status: DC

## 2020-06-19 MED ORDER — LACTATED RINGERS IV SOLN: INTRAVENOUS | Status: DC

## 2020-06-19 MED ORDER — MIDAZOLAM HCL 2 MG/2ML IJ SOLN
INTRAMUSCULAR | Status: AC
  Filled 2020-06-19: qty 2

## 2020-06-19 MED ORDER — ROCURONIUM BROMIDE 10 MG/ML (PF) SYRINGE
PREFILLED_SYRINGE | INTRAVENOUS | Status: DC | PRN
  Administered 2020-06-19: 80 mg via INTRAVENOUS

## 2020-06-19 MED ORDER — BUPIVACAINE-EPINEPHRINE (PF) 0.25% -1:200000 IJ SOLN
INTRAMUSCULAR | Status: AC
  Filled 2020-06-19: qty 30

## 2020-06-19 MED ORDER — HYDROMORPHONE HCL 1 MG/ML IJ SOLN
INTRAMUSCULAR | Status: AC
  Administered 2020-06-19: 0.5 mg via INTRAVENOUS
  Filled 2020-06-19: qty 1

## 2020-06-19 MED ORDER — MIDAZOLAM HCL 5 MG/5ML IJ SOLN
INTRAMUSCULAR | Status: DC | PRN
  Administered 2020-06-19: 2 mg via INTRAVENOUS

## 2020-06-19 MED ORDER — SUGAMMADEX SODIUM 500 MG/5ML IV SOLN
INTRAVENOUS | Status: DC | PRN
  Administered 2020-06-19: 200 mg via INTRAVENOUS
  Administered 2020-06-19: 300 mg via INTRAVENOUS

## 2020-06-19 MED ORDER — ACETAMINOPHEN 325 MG PO TABS: 650.0000 mg | ORAL_TABLET | Freq: Four times a day (QID) | ORAL | 0 refills | Status: DC | PRN

## 2020-06-19 MED ORDER — VANCOMYCIN HCL IN DEXTROSE 1-5 GM/200ML-% IV SOLN
INTRAVENOUS | Status: AC
  Filled 2020-06-19: qty 200

## 2020-06-19 MED ORDER — ONDANSETRON HCL 4 MG/2ML IJ SOLN
INTRAMUSCULAR | Status: AC
  Filled 2020-06-19: qty 2

## 2020-06-19 MED ORDER — OXYCODONE HCL 5 MG PO TABS: 5.0000 mg | ORAL_TABLET | Freq: Four times a day (QID) | ORAL | 0 refills | Status: AC | PRN

## 2020-06-19 MED ORDER — DOCUSATE SODIUM 100 MG PO CAPS: 100.0000 mg | ORAL_CAPSULE | Freq: Two times a day (BID) | ORAL | 0 refills | Status: DC

## 2020-06-19 MED ORDER — FENTANYL CITRATE (PF) 100 MCG/2ML IJ SOLN
INTRAMUSCULAR | Status: AC
  Filled 2020-06-19: qty 2

## 2020-06-19 SURGICAL SUPPLY — 26 items
BLADE SURG 15 STRL LF DISP TIS (BLADE) ×1 IMPLANT
BLADE SURG 15 STRL SS (BLADE) ×2
BNDG GAUZE ELAST 4 BULKY (GAUZE/BANDAGES/DRESSINGS) ×1 IMPLANT
COVER WAND RF STERILE (DRAPES) IMPLANT
DECANTER SPIKE VIAL GLASS SM (MISCELLANEOUS) IMPLANT
DRAIN PENROSE 0.5X18 (DRAIN) ×1 IMPLANT
DRSG PAD ABDOMINAL 8X10 ST (GAUZE/BANDAGES/DRESSINGS) ×1 IMPLANT
ELECT REM PT RETURN 15FT ADLT (MISCELLANEOUS) ×2 IMPLANT
GAUZE SPONGE 4X4 12PLY STRL (GAUZE/BANDAGES/DRESSINGS) ×1 IMPLANT
GLOVE BIOGEL M 8.0 STRL (GLOVE) ×2 IMPLANT
GOWN STRL REUS W/TWL XL LVL3 (GOWN DISPOSABLE) ×4 IMPLANT
KIT BASIN OR (CUSTOM PROCEDURE TRAY) ×2 IMPLANT
KIT TURNOVER KIT A (KITS) ×2 IMPLANT
NEEDLE HYPO 22GX1.5 SAFETY (NEEDLE) ×1 IMPLANT
PACK LITHOTOMY IV (CUSTOM PROCEDURE TRAY) ×2 IMPLANT
PENCIL SMOKE EVACUATOR (MISCELLANEOUS) IMPLANT
SPONGE LAP 18X18 RF (DISPOSABLE) ×2 IMPLANT
SUT ETHILON 3 0 PS 1 (SUTURE) IMPLANT
SUT VIC AB 3-0 SH 27 (SUTURE)
SUT VIC AB 3-0 SH 27XBRD (SUTURE) IMPLANT
SWAB COLLECTION DEVICE MRSA (MISCELLANEOUS) ×1 IMPLANT
SWAB CULTURE ESWAB REG 1ML (MISCELLANEOUS) ×1 IMPLANT
SYR BULB IRRIG 60ML STRL (SYRINGE) ×2 IMPLANT
SYR CONTROL 10ML LL (SYRINGE) ×1 IMPLANT
TOWEL OR 17X26 10 PK STRL BLUE (TOWEL DISPOSABLE) ×2 IMPLANT
TOWEL OR NON WOVEN STRL DISP B (DISPOSABLE) ×2 IMPLANT

## 2020-06-19 NOTE — Anesthesia Preprocedure Evaluation (Addendum)
Anesthesia Evaluation  Patient identified by MRN, date of birth, ID band Patient awake    Reviewed: Allergy & Precautions, NPO status , Patient's Chart, lab work & pertinent test results  History of Anesthesia Complications Negative for: history of anesthetic complications  Airway Mallampati: II  TM Distance: >3 FB Neck ROM: Full    Dental  (+) Chipped, Poor Dentition, Dental Advisory Given   Pulmonary sleep apnea (no longer uses CPAP) , former smoker,  06/18/2020 SARS coronavirus NEG   breath sounds clear to auscultation       Cardiovascular hypertension, Pt. on medications  Rhythm:Regular Rate:Normal     Neuro/Psych  Neuromuscular disease (peripheral neuropathy)    GI/Hepatic negative GI ROS, Neg liver ROS,   Endo/Other  diabetes (glu 132), Oral Hypoglycemic Agents, Insulin DependentMorbid obesity  Renal/GU negative Renal ROS     Musculoskeletal   Abdominal (+) + obese,   Peds  Hematology negative hematology ROS (+)   Anesthesia Other Findings Groin abscess  Reproductive/Obstetrics                            Anesthesia Physical Anesthesia Plan  ASA: III  Anesthesia Plan: General   Post-op Pain Management:    Induction: Intravenous  PONV Risk Score and Plan: 2 and Ondansetron and Dexamethasone  Airway Management Planned: Oral ETT  Additional Equipment: None  Intra-op Plan:   Post-operative Plan: Extubation in OR  Informed Consent: I have reviewed the patients History and Physical, chart, labs and discussed the procedure including the risks, benefits and alternatives for the proposed anesthesia with the patient or authorized representative who has indicated his/her understanding and acceptance.     Dental advisory given  Plan Discussed with: CRNA and Surgeon  Anesthesia Plan Comments:        Anesthesia Quick Evaluation

## 2020-06-19 NOTE — Anesthesia Procedure Notes (Signed)
Procedure Name: Intubation Date/Time: 06/19/2020 8:40 AM Performed by: Lissa Morales, CRNA Pre-anesthesia Checklist: Patient identified, Emergency Drugs available, Suction available and Patient being monitored Patient Re-evaluated:Patient Re-evaluated prior to induction Oxygen Delivery Method: Circle system utilized Preoxygenation: Pre-oxygenation with 100% oxygen Induction Type: IV induction Ventilation: Mask ventilation without difficulty Laryngoscope Size: Mac and 4 Grade View: Grade II Tube type: Oral Tube size: 7.5 mm Number of attempts: 1 Airway Equipment and Method: Stylet and Oral airway Placement Confirmation: ETT inserted through vocal cords under direct vision,  positive ETCO2 and breath sounds checked- equal and bilateral Secured at: 22 cm Tube secured with: Tape Dental Injury: Teeth and Oropharynx as per pre-operative assessment

## 2020-06-19 NOTE — Progress Notes (Signed)
Inpatient Diabetes Program Recommendations  AACE/ADA: New Consensus Statement on Inpatient Glycemic Control (2015)  Target Ranges:  Prepandial:   less than 140 mg/dL      Peak postprandial:   less than 180 mg/dL (1-2 hours)      Critically ill patients:  140 - 180 mg/dL   Results for Shaun Cummings, Shaun Cummings (MRN [id number]) as of 06/19/2020 09:08  Ref. Range 06/18/2020 17:29 06/18/2020 21:43 06/19/2020 07:42  Glucose-Capillary Latest Ref Range: 70 - 99 mg/dL 82 163 (H) 132 (H)   Results for Shaun Cummings, Shaun Cummings (MRN [id number]) as of 06/19/2020 07:09  Ref. Range 03/09/2018 16:18 06/18/2020 16:26  Hemoglobin A1C Latest Ref Range: 4.8 - 5.6 % >14.0 10.0 (H)  (240 mg/dl)    Admit with: Right groin abscess  History: DM  Home DM Meds: Glipizide 10 mg Daily        Novolin R (Regular) 9 units TID        Novolin N (NPH) 25 units AM/ 20 units QHS        Current Orders: Novolog Moderate Correction Scale/ SSI (0-15 units) TID AC + HS   PCP is with [facility] Lily Peer, FNP)  CBG 132 this AM   --Will follow patient during hospitalization--  Wyn Quaker RN, MSN, CDE Diabetes Coordinator Inpatient Glycemic Control Team Team Pager: [phone] (8a-5p)

## 2020-06-19 NOTE — Transfer of Care (Signed)
Immediate Anesthesia Transfer of Care Note  Patient: Shaun Cummings  Procedure(s) Performed: INCISION AND DRAINAGE ABSCESS, RIGHT GROIN (Right )  Patient Location: PACU  Anesthesia Type:General  Level of Consciousness: awake, alert , oriented and patient cooperative  Airway & Oxygen Therapy: Patient Spontanous Breathing and Patient connected to face mask oxygen  Post-op Assessment: Report given to RN, Post -op Vital signs reviewed and stable and Patient moving all extremities X 4  Post vital signs: stable  Last Vitals:  Vitals Value Taken Time  BP 155/86 06/19/20 0930  Temp    Pulse 72 06/19/20 0940  Resp 16 06/19/20 0940  SpO2 100 % 06/19/20 0940  Vitals shown include unvalidated device data.  Last Pain:  Vitals:   06/19/20 0749  TempSrc: Oral  PainSc:          Complications: No complications documented.

## 2020-06-19 NOTE — Progress Notes (Signed)
Reviewed d/c instructions w pt and all questions answered. Pt verbalized understanding. Pt given several packs of dry guaze and mesh underwear and wound care reviewed. He verbalized understanding of this as well. D/C ambulatory w all belongings in stable condition.

## 2020-06-19 NOTE — Discharge Summary (Signed)
[facility] Discharge Summary   Patient ID: Shaun Cummings MRN: [id number] DOB/AGE: Apr 09, 1975 45 y.o.  Admit date: 06/18/2020 Discharge date: 06/19/2020  Admitting Diagnosis: Right groin abscess  Discharge Diagnosis Patient Active Problem List   Diagnosis Date Noted  . Abscess of right groin 06/18/2020  . Cellulitis 03/20/2018  . Infected epithelial inclusion cyst 03/14/2018  . Hypertension 03/09/2018  . Peripheral neuropathy 05/21/2017  . Hyperlipidemia associated with type 2 diabetes mellitus ([place]) 09/18/2013  . Obesity (BMI 30-39.9) 09/18/2013  . Diabetes ([place]) 09/15/2013    Consultants None  Imaging: CT ABDOMEN PELVIS WO CONTRAST  Result Date: 06/18/2020 CLINICAL DATA:  Follow-up groin abscess. EXAM: CT ABDOMEN AND PELVIS WITHOUT CONTRAST TECHNIQUE: Multidetector CT imaging of the abdomen and pelvis was performed following the standard protocol without IV contrast. COMPARISON:  06/10/2020 FINDINGS: Lower chest: No acute abnormality. Calcified granuloma noted in the left lower lobe. Hepatobiliary: No focal liver abnormality is seen. No gallstones, gallbladder wall thickening, or biliary dilatation. Pancreas: Unremarkable. No pancreatic ductal dilatation or surrounding inflammatory changes. Spleen: Normal in size without focal abnormality. Adrenals/Urinary Tract: Normal adrenal glands. The right kidney appears normal. Several punctate stones identified within the upper and mid left kidney measuring up to 4 mm. No signs of hydronephrosis. The urinary bladder appears normal. Stomach/Bowel: Stomach is unremarkable. The appendix is visualized and appears normal. No bowel wall thickening, inflammation, or distension. Vascular/Lymphatic: Aortic atherosclerosis. No abdominal adenopathy. Multiple prominent lymph nodes within the right inguinal region are likely reactive measuring up to 1.3 cm short axis, image 96/2. Reproductive: Prostate is unremarkable. Other: Skin thickening  and diffuse subcutaneous soft tissue stranding identified within the right inguinal region. This has progressed when compared with the previous exam. There is a central area phlegmon/abscess formation which measures 3.5 by 2.4 by 4.3 cm, image 68/4. Compared with the previous exam this appears more organized and well-defined. On 06/10/2020 a subcutaneous area of phlegmon was reported measuring 3.3 x 2.3 by 3.8 cm. Musculoskeletal: No acute or significant osseous findings. IMPRESSION: 1. Interval progression of inflammatory changes within the right inguinal region compatible with cellulitis. There is a central area of phlegmon/abscess formation which measures 3.5 x 2.4 x 4.3 cm. This appears more organized and well-defined when compared with the previous exam. On 06/10/2020 a subcutaneous area of phlegmon was reported measuring 3.3 x 2.3 by 3.8 cm. 2. Nonobstructing left renal calculi. 3. Aortic atherosclerosis. Aortic Atherosclerosis (ICD10-I70.0). Electronically Signed   By: Kerby Moors M.D.   On: 06/18/2020 12:11    Procedures Dr. Hassell Done (06/19/20) - Incision, drainage and placement of 1/4" penrose drain in right inguinal nodal abscess--cultured  Hospital Course:  45 yo male who presented to [facility] ED with right groin pain in setting of history of known abscess.  Workup showed cellulitis and abscess on CT.  Patient was admitted for IV antibiotics and then underwent procedure listed above.  Tolerated procedure well and was transferred to the floor.  Diet was advanced as tolerated.  On the day of his procedure, the patient was voiding well, tolerating diet, ambulating well, pain well controlled, vital signs stable, incision with expected bloody serous drainage and penrose drain in place. He was felt stable for discharge home.  Patient will follow up in our office in 1-2 weeks and knows to call with questions or concerns. Penrose drain will be removed at follow up. He was taking doxycyline prior to  admission and will resume this - further antibiotic was sent to his pharmacy  to cover a 5 day post operative course in the setting of diabetes.  I or a member of my team have reviewed this patient in the Controlled Substance Database.   Allergies as of 06/19/2020      Reactions   Augmentin [amoxicillin-pot Clavulanate] Hives, Itching   Has patient had a PCN reaction causing immediate rash, facial/tongue/throat swelling, SOB or lightheadedness with hypotension: Yes Has patient had a PCN reaction causing severe rash involving mucus membranes or skin necrosis: No Has patient had a PCN reaction that required hospitalization: No Has patient had a PCN reaction occurring within the last 10 years: No If all of the above answers are "NO", then may proceed with Cephalosporin use.   Contrast Media [iodinated Diagnostic Agents] Hives   Pt broke out with 1 hive on forehead after CT injection-treated with 64m Benadryl      Medication List    TAKE these medications   acetaminophen 325 MG tablet Commonly known as: TYLENOL Take 2 tablets (650 mg total) by mouth every 6 (six) hours as needed for mild pain (or temp > 100).   amLODipine 5 MG tablet Commonly known as: NORVASC Take 5 mg by mouth daily.   Contour Next Monitor w/Device Kit 30 Units by Does not apply route at bedtime.   docusate sodium 100 MG capsule Commonly known as: COLACE Take 1 capsule (100 mg total) by mouth 2 (two) times daily.   doxycycline 100 MG capsule Commonly known as: Vibramycin Take 1 capsule (100 mg total) by mouth 2 (two) times daily.   glipiZIDE 10 MG 24 hr tablet Commonly known as: GLUCOTROL XL Take 10 mg by mouth every morning.   glucose blood test strip Commonly known as: Contour Next Test Use as instructed   ibuprofen 600 MG tablet Commonly known as: ADVIL Take 1 tablet (600 mg total) by mouth every 6 (six) hours as needed.   NovoLIN N ReliOn 100 UNIT/ML injection Generic drug: insulin NPH  Human Inject 20-25 Units into the skin in the morning. 25 in the morning and 20 units at bedtime   NovoLIN R ReliOn 100 units/mL injection Generic drug: insulin regular Inject 9 Units into the skin in the morning, at noon, and at bedtime.   oxyCODONE 5 MG immediate release tablet Commonly known as: Oxy IR/ROXICODONE Take 1 tablet (5 mg total) by mouth every 6 (six) hours as needed for up to 3 days for moderate pain or severe pain (565mmoderate, 1032mevere).         Follow-up Information    WilDelford FieldNP. Schedule an appointment as soon as possible for a visit.   Specialty: Family Medicine Why: Call to arrange follow up regarding high blood pressure and diabetes Contact information: [address] [id number] on 07/02/2020.   Specialty: General Surgery Why: Your appointment is 6/7 at 11:45am Please arrive 30 minutes prior to your appointment to check in and fill out paperwork. Bring photo ID and insDoctor, general practiceformation: [id number]           Signed: MarCaroll RanchernCogdell Memorial Hospitalrgery 06/19/2020, 3:06 PM Please see Amion for pager number during day hours 7:00am-4:30pm

## 2020-06-19 NOTE — Op Note (Signed)
Shaun Cummings  11-02-1975   06/19/2020    PCP:  Delford Field, FNP   Surgeon: Kaylyn Lim, MD, FACS  Asst:  none  Anes:  general  Preop Dx: Abscess in the right groin Postop Dx: same  Procedure: Incision, drainage and placement of 1/4" penrose drain in right inguinal nodal abscess--cultured Location Surgery: [facility] Complications: none  EBL:   minimal cc  Drains:   Penrose drain  Description of Procedure:  The patient was taken to OR 4 .  After anesthesia was administered and the patient was prepped  with chloroprep   and a timeout was performed.  The previously marked site was probed with an 18 gauge needle and no pus was recovered.  A transverse incision was made and I carefully dissected to the nodes and encountered pus which was cultured x 2.  It was whiteish-bloody and was not foul smelling.  This cavity connected with a previously placed 5 mm opening on the leg.  The two holes and the tract were connected by placing a penrose drain through these and connecting them with a 2-0 nylon.  The tract was irrigated with hydrogen peroxide.     The patient tolerated the procedure well and was taken to the PACU in stable condition.     Matt B. Hassell Done, [place], [facility], [place]

## 2020-06-19 NOTE — Discharge Instructions (Addendum)
Skin Abscess  A skin abscess is an infected area on or under your skin that contains a collection of pus and other material. An abscess may also be called a furuncle, carbuncle, or boil. An abscess can occur in or on almost any part of your body. Some abscesses break open (rupture) on their own. Most continue to get worse unless they are treated. The infection can spread deeper into the body and eventually into your blood, which can make you feel ill. Treatment usually involves draining the abscess. What are the causes? An abscess occurs when germs, like bacteria, pass through your skin and cause an infection. This may be caused by:  A scrape or cut on your skin.  A puncture wound through your skin, including a needle injection or insect bite.  Blocked oil or sweat glands.  Blocked and infected hair follicles.  A cyst that forms beneath your skin (sebaceous cyst) and becomes infected. What increases the risk? This condition is more likely to develop in people who:  Have a weak body defense system (immune system).  Have diabetes.  Have dry and irritated skin.  Get frequent injections or use illegal IV drugs.  Have a foreign body in a wound, such as a splinter.  Have problems with their lymph system or veins. What are the signs or symptoms? Symptoms of this condition include:  A painful, firm bump under the skin.  A bump with pus at the top. This may break through the skin and drain. Other symptoms include:  Redness surrounding the abscess site.  Warmth.  Swelling of the lymph nodes (glands) near the abscess.  Tenderness.  A sore on the skin. How is this diagnosed? This condition may be diagnosed based on:  A physical exam.  Your medical history.  A sample of pus. This may be used to find out what is causing the infection.  Blood tests.  Imaging tests, such as an ultrasound, CT scan, or MRI. How is this treated? A small abscess that drains on its own may not  need treatment. Treatment for larger abscesses may include:  Moist heat or heat pack applied to the area several times a day.  A procedure to drain the abscess (incision and drainage).  Antibiotic medicines. For a severe abscess, you may first get antibiotics through an IV and then change to antibiotics by mouth. Follow these instructions at home: Medicines  Take over-the-counter and prescription medicines only as told by your health care provider.  If you were prescribed an antibiotic medicine, take it as told by your health care provider. Do not stop taking the antibiotic even if you start to feel better.   Abscess care  If you have an abscess that has not drained, apply heat to the affected area. Use the heat source that your health care provider recommends, such as a moist heat pack or a heating pad. ? Place a towel between your skin and the heat source. ? Leave the heat on for 20-30 minutes. ? Remove the heat if your skin turns bright red. This is especially important if you are unable to feel pain, heat, or cold. You may have a greater risk of getting burned.  Follow instructions from your health care provider about how to take care of your abscess. Make sure you: ? Cover the abscess with a bandage (dressing). ? Change your dressing or gauze as told by your health care provider. ? Wash your hands with soap and water before you change the  dressing or gauze. If soap and water are not available, use hand sanitizer.  Check your abscess every day for signs of a worsening infection. Check for: ? More redness, swelling, or pain. ? More fluid or blood. ? Warmth. ? More pus or a bad smell.   General instructions  To avoid spreading the infection: ? Do not share personal care items, towels, or hot tubs with others. ? Avoid making skin contact with other people.  Keep all follow-up visits as told by your health care provider. This is important. Contact a health care provider if you  have:  More redness, swelling, or pain around your abscess.  More fluid or blood coming from your abscess.  Warm skin around your abscess.  More pus or a bad smell coming from your abscess.  A fever.  Muscle aches.  Chills or a general ill feeling. Get help right away if you:  Have severe pain.  See red streaks on your skin spreading away from the abscess. Summary  A skin abscess is an infected area on or under your skin that contains a collection of pus and other material.  A small abscess that drains on its own may not need treatment.  Treatment for larger abscesses may include having a procedure to drain the abscess and taking an antibiotic. This information is not intended to replace advice given to you by your health care provider. Make sure you discuss any questions you have with your health care provider. Document Revised: 05/05/2018 Document Reviewed: 02/25/2017 Elsevier Patient Education  2021 [employer].    Incision and Drainage, Care After This sheet gives you information about how to care for yourself after your procedure. Your health care provider may also give you more specific instructions. If you have problems or questions, contact your health care provider. What can I expect after the procedure? After the procedure, it is common to have:  Pain or discomfort around the incision site.  Blood, fluid, or pus (drainage) from the incision.  Redness and firm skin around the incision site. Follow these instructions at home: Medicines  Take over-the-counter and prescription medicines only as told by your health care provider.  If you were prescribed an antibiotic medicine, use or take it as told by your health care provider. Do not stop using the antibiotic even if you start to feel better. Wound care Follow instructions from your health care provider about how to take care of your wound. Make sure you:  Wash your hands with soap and water before and  after you change your bandage (dressing). If soap and water are not available, use hand sanitizer. ? Change your dressing at least twice daily ? If your dressing is dry or stuck when you try to remove it, moisten or wet the dressing with saline or water so that it can be removed without harming your skin or tissues. ? If your wound is packed, leave it in place until your health care provider tells you to remove it. To remove the packing, moisten or wet the packing with saline or water so that it can be removed without harming your skin or tissues.  Leave stitches (sutures), skin glue, or adhesive strips in place. These skin closures may need to stay in place for 2 weeks or longer. If adhesive strip edges start to loosen and curl up, you may trim the loose edges. Do not remove adhesive strips completely unless your health care provider tells you to do that. Check your wound  every day for signs of infection. Check for:  More redness, swelling, or pain.  More fluid or blood.  Warmth.  Pus or a bad smell. If you were sent home with a drain tube in place, follow instructions from your health care provider about:  How to empty it.  How to care for it at home.   General instructions  Do not take baths, swim, or use a hot tub until your health care provider approves. You MAY take showers.   You may return to regular activities  The incision will continue to drain. It is normal to have some clear or slightly bloody drainage. The amount of drainage should lessen each day.  Do not apply any creams, ointments, or liquids unless you have been told to by your health care provider.  Keep all follow-up visits as told by your health care provider. This is important. Contact a health care provider if:  Your cyst or abscess returns.  You have a fever or chills.  You have more redness, swelling, or pain around your incision.  Your incision feels warm to the touch.  You have pus or a bad smell  coming from your incision.  You have red streaks above or below the incision site. Get help right away if:  You have severe pain or bleeding.  You cannot eat or drink without vomiting.  You have decreased urine output.  You become short of breath.  You have chest pain.  You cough up blood.  The affected area becomes numb or starts to tingle. These symptoms may represent a serious problem that is an emergency. Do not wait to see if the symptoms will go away. Get medical help right away. Call your local emergency services (911 in the [place] Do not drive yourself to the hospital. Summary  After this procedure, it is common to have fluid, blood, or pus coming from the surgery site.  Follow all home care instructions. You will be told how to take care of your incision, how to check for infection, and how to take medicines.  If you were prescribed an antibiotic medicine, take it as told by your health care provider. Do not stop taking the antibiotic even if you start to feel better.  Contact a health care provider if you have increased redness, swelling, or pain around your incision. Get help right away if you have chest pain, you vomit, you cough up blood, or you have shortness of breath.  Keep all follow-up visits as told by your health care provider. This is important. This information is not intended to replace advice given to you by your health care provider. Make sure you discuss any questions you have with your health care provider. Document Revised: 12/13/2017 Document Reviewed: 12/13/2017 Elsevier Patient Education  2021 [employer].

## 2020-06-19 NOTE — Anesthesia Postprocedure Evaluation (Signed)
Anesthesia Post Note  Patient: Shaun Cummings  Procedure(s) Performed: INCISION AND DRAINAGE ABSCESS, RIGHT GROIN (Right )     Patient location during evaluation: PACU Anesthesia Type: General Level of consciousness: awake and alert, patient cooperative and oriented Pain management: pain level controlled Vital Signs Assessment: post-procedure vital signs reviewed and stable Respiratory status: spontaneous breathing, nonlabored ventilation and respiratory function stable Cardiovascular status: blood pressure returned to baseline and stable Postop Assessment: no apparent nausea or vomiting Anesthetic complications: no   No complications documented.  Last Vitals:  Vitals:   06/19/20 1015 06/19/20 1036  BP: (!) 141/90 (!) 155/91  Pulse: 66 64  Resp: 17   Temp: 37 C 36.9 C  SpO2: 96% 98%    Last Pain:  Vitals:   06/19/20 1036  TempSrc: Oral  PainSc:                  Audrina Marten,E. [place]

## 2020-06-19 NOTE — Interval H&P Note (Signed)
History and Physical Interval Note:  06/19/2020 8:07 AM  Shaun Cummings  has presented today for surgery, with the diagnosis of RIGHT GROIN ABSCESS.  The various methods of treatment have been discussed with the patient and family. After consideration of risks, benefits and other options for treatment, the patient has consented to  Procedure(s): INCISION AND DRAINAGE ABSCESS, RIGHT GROIN (Right) as a surgical intervention.  The patient's history has been reviewed, patient examined, no change in status, stable for surgery.  I have reviewed the patient's chart and labs.  Questions were answered to the patient's satisfaction.     [name]

## 2020-06-20 ENCOUNTER — Encounter (HOSPITAL_COMMUNITY): Payer: Self-pay | Admitting: Surgery

## 2020-06-22 LAB — AEROBIC CULTURE W GRAM STAIN (SUPERFICIAL SPECIMEN)

## 2020-06-23 LAB — ANAEROBIC CULTURE W GRAM STAIN

## 2020-06-25 NOTE — Progress Notes (Signed)
Cardiology Office Note:    Date:  07/01/2020   ID:  Shaun Cummings, DOB Oct 22, 1975, MRN [id number]  PCP:  Delford Field, FNP   [facility] Providers Cardiologist:  None {   Referring MD: Eustaquio Maize, PA-C    History of Present Illness:    Shaun Cummings is a 45 y.o. male with a hx of HLD, DMII with peripheral neuropathy, and OSA who was referred by Dr. Alroy Bailiff for work-up of chest pain.  Patient presented to ER on 04/27/20 with intermittent chest pain and a tingling sensation down the left arm. Symptoms had been ongoing for a couple of weeks intermittently and not exertional in nature. Specifically, he developed a sharp pain in his chest that radiated down his arm usually occurring when he is sitting in his chair playing video games. Troponin was normal. ECG showed normal sinus rhythm with TWI in III but no other changes. CXR with no active disease and he was discharged home.   Since the ER visit, he has not had recurrence of chest discomfort. Had multiple ER visits for a groin abscess, which is improving. Has been able to mow the lawn and walk without anginal symptoms. No SOB, LE edema, palpitations, dizziness or syncope. Blood pressure has been high at home in 140s. Off amlodipine due to LE edema.  Family history: Mother with premature CAD in 16s s/p PCI; Father with MI in 73s.   Past Medical History:  Diagnosis Date  . Chest pain   . Hyperlipidemia   . Lesion of penis    foreskin  . OSA (obstructive sleep apnea)    per pt dx osa and used cpap but after losing wt. from 415 pounds down to 244 pounds no longer needs cpap  . Peripheral neuropathy   . Phimosis   . Scrotal abscess 05/08/2017  . Type 2 diabetes mellitus (HCC)    per pt no meds for two years pt was changed to levemir unable to afford but pt states is waiting on approval for a program he applied for that will pay for his meds [facility])      Past Surgical History:  Procedure Laterality Date  .  CIRCUMCISION N/A 02/10/2016   Procedure: CIRCUMCISION ADULT;  Surgeon: Cleon Gustin, MD;  Location: [facility];  Service: Urology;  Laterality: N/A;  . INCISION AND DRAINAGE ABSCESS Right 05/12/2017   Procedure: INCISION AND DRAINAGE RIGHT INGUINAL ABSCESS;  Surgeon: Erroll Luna, MD;  Location: [place];  Service: General;  Laterality: Right;  . INCISION AND DRAINAGE ABSCESS N/A 03/24/2018   Procedure: INCISION AND DRAINAGE POSTERIOR NECK ABSCESS;  Surgeon: Excell Seltzer, MD;  Location: [place];  Service: General;  Laterality: N/A;  . INCISION AND DRAINAGE ABSCESS Right 06/19/2020   Procedure: INCISION AND DRAINAGE ABSCESS, RIGHT GROIN;  Surgeon: Johnathan Hausen, MD;  Location: [facility];  Service: General;  Laterality: Right;  . INCISION AND DRAINAGE PERIRECTAL ABSCESS Right 05/09/2017   Procedure: IRRIGATION AND DEBRIDEMENT PERINEAL ABSCESS;  Surgeon: Donnie Mesa, MD;  Location: [place];  Service: General;  Laterality: Right;  . NO PAST SURGERIES      Current Medications: Current Meds  Medication Sig  . acetaminophen (TYLENOL) 325 MG tablet Take 2 tablets (650 mg total) by mouth every 6 (six) hours as needed for mild pain (or temp > 100).  Marland Kitchen glipiZIDE (GLUCOTROL XL) 10 MG 24 hr tablet Take 10 mg by mouth every morning.  Marland Kitchen ibuprofen (ADVIL) 600 MG tablet Take  600 mg by mouth every 6 (six) hours as needed.  Marland Kitchen NOVOLIN N RELION 100 UNIT/ML injection Inject 20-25 Units into the skin in the morning. 25 in the morning and 20 units at bedtime  . NOVOLIN R RELION 100 UNIT/ML injection Inject 9 Units into the skin in the morning, at noon, and at bedtime.  . rosuvastatin (CRESTOR) 10 MG tablet Take 1 tablet (10 mg total) by mouth daily.  . valsartan (DIOVAN) 80 MG tablet Take 1 tablet (80 mg total) by mouth daily.     Allergies:   Augmentin [amoxicillin-pot clavulanate] and Contrast media [iodinated diagnostic agents]   Social History   Socioeconomic History  . Marital status:  Married    Spouse name: Not on file  . Number of children: 2  . Years of education: college   . Highest education level: Not on file  Occupational History  . Occupation: Group Home   Tobacco Use  . Smoking status: Former Smoker    Years: 2.00    Types: Cigarettes    Quit date: 02/05/2008    Years since quitting: 12.4  . Smokeless tobacco: Never Used  Vaping Use  . Vaping Use: Never used  Substance and Sexual Activity  . Alcohol use: No  . Drug use: No  . Sexual activity: Not on file  Other Topics Concern  . Not on file  Social History Narrative   Lives with wife and 2 kids, age 38 and 60.    Social Determinants of Health   Financial Resource Strain: Not on file  Food Insecurity: Not on file  Transportation Needs: Not on file  Physical Activity: Not on file  Stress: Not on file  Social Connections: Not on file     Family History: The patient's family history includes Angina in his mother; CAD in his mother; Diabetes in his brother, father, maternal grandfather, maternal grandmother, mother, paternal grandfather, paternal grandmother, and sister; Heart attack in his father; Kidney failure in his father.  ROS:   Please see the history of present illness.    Review of Systems  Constitutional: Negative for chills and fever.  HENT: Negative for hearing loss.   Eyes: Negative for blurred vision.  Respiratory: Negative for shortness of breath.   Cardiovascular: Positive for chest pain. Negative for palpitations, orthopnea, claudication, leg swelling and PND.  Gastrointestinal: Negative for nausea and vomiting.  Genitourinary: Negative for dysuria.  Musculoskeletal: Positive for myalgias.  Neurological: Negative for dizziness and loss of consciousness.  Psychiatric/Behavioral: Negative for substance abuse.    EKGs/Labs/Other Studies Reviewed:    The following studies were reviewed today: CT abdomen/pelvis 06/18/20: IMPRESSION: 1. Interval progression of inflammatory  changes within the right inguinal region compatible with cellulitis. There is a central area of phlegmon/abscess formation which measures 3.5 x 2.4 x 4.3 cm. This appears more organized and well-defined when compared with the previous exam. On 06/10/2020 a subcutaneous area of phlegmon was reported measuring 3.3 x 2.3 by 3.8 cm. 2. Nonobstructing left renal calculi. 3. Aortic atherosclerosis.  EKG:  EKG 04/27/20 NSR with TWI in III (isolated)  Recent Labs: 06/10/2020: ALT 14 06/19/2020: BUN 12; Creatinine, Ser 0.77; Hemoglobin 12.2; Magnesium 1.9; Platelets 350; Potassium 3.3; Sodium 139  Recent Lipid Panel    Component Value Date/Time   CHOL 244 (H) 09/08/2017 1155   TRIG 296 (H) 09/08/2017 1155   HDL 30 (L) 09/08/2017 1155   CHOLHDL 8.1 (H) 09/08/2017 1155   CHOLHDL 7.7 09/15/2013 1727   VLDL NOT  CALC 09/15/2013 1727   LDLCALC 155 (H) 09/08/2017 1155      Physical Exam:    VS:  BP (!) 160/82   Pulse 74   Ht 6' (1.829 m)   Wt 253 lb 6.4 oz (114.9 kg)   SpO2 98%   BMI 34.37 kg/m     Wt Readings from Last 3 Encounters:  07/01/20 253 lb 6.4 oz (114.9 kg)  06/18/20 247 lb (112 kg)  06/10/20 245 lb (111.1 kg)     GEN:  Well nourished, well developed in no acute distress HEENT: Normal NECK: No JVD; No carotid bruits CARDIAC: RRR, no murmurs, rubs, gallops RESPIRATORY:  Clear to auscultation without rales, wheezing or rhonchi  ABDOMEN: Soft, non-tender, non-distended MUSCULOSKELETAL:  No edema; No deformity  SKIN: Warm and dry NEUROLOGIC:  Alert and oriented x 3 PSYCHIATRIC:  Normal affect   ASSESSMENT:    1. Precordial pain   2. Hyperlipidemia associated with type 2 diabetes mellitus ([place])   3. Primary hypertension   4. Family history of early CAD   36. Obesity (BMI 30-39.9)   6. Medication management    PLAN:    In order of problems listed above:  #Atpyical Chest Pain: Patient with episodes of sharp chest pain radiating around his shoulder and down his left  arm while seated and playing video games. No symptoms with exercise. Does not sound cardiac in nature but likely nerve/MSK. Stress testing not indicated at this time, but will risk stratify with Ca score as below. -Management per PCP  #Family History Premature CAD: #HLD: Patient with strong family history of CAD with mom with MI in 72s and dad with MI in 31s. LDL 155 in 2019. -Check Ca score for risk stratification -Start crestor '10mg'$  daily given DMII and elevated LDL -Repeat lipids in 6 weeks  #HTN: Off amlodipine due to swelling. Elevated at 140s at home. -Start valsartan '80mg'$  daily -Repeat BMET next week  #DMII: -Glipizide '10mg'$  daily -Continue insulin -Follow-up with PCP as scheduled        Medication Adjustments/Labs and Tests Ordered: Current medicines are reviewed at length with the patient today.  Concerns regarding medicines are outlined above.  Orders Placed This Encounter  Procedures  . CT CARDIAC SCORING (SELF PAY ONLY)  . Basic metabolic panel  . Lipid panel   Meds ordered this encounter  Medications  . valsartan (DIOVAN) 80 MG tablet    Sig: Take 1 tablet (80 mg total) by mouth daily.    Dispense:  90 tablet    Refill:  1  . rosuvastatin (CRESTOR) 10 MG tablet    Sig: Take 1 tablet (10 mg total) by mouth daily.    Dispense:  90 tablet    Refill:  2    Patient Instructions  Medication Instructions:   START TAKING VALSARTAN 80 MG BY MOUTH DAILY  START TAKING ROSUVASTATIN (CRESTOR) 10 MG BY MOUTH DAILY  *If you need a refill on your cardiac medications before your next appointment, please call your pharmacy*   Lab Work:  1.) BMET SAME DAY AS YOUR CALCIUM SCORE IS SCHEDULED FOR HERE AT THE OFFICE  2.) LIPIDS IN 6 WEEKS HERE AT THE OFFICE--PLEASE COME FASTING TO THIS LAB APPOINTMENT  If you have labs (blood work) drawn today and your tests are completely normal, you will receive your results only by: Marland Kitchen MyChart Message (if you have MyChart) OR . A  paper copy in the mail If you have any lab test that is  abnormal or we need to change your treatment, we will call you to review the results.   Testing/Procedures:  CALCIUM SCORE TO BE DONE HERE IN THE OFFICE--SELF PAY--HAVE YOUR BMET CHECKED ON THIS DAY   Follow-Up:  6-8 MONTHS WITH DR. Johney Frame IN THE OFFICE    Other Instructions  PLEASE LET us KNOW IF YOU CANNOT TOLERATE CRESTOR--YOU CAN CALL us OR SEND [place] A MYCHART MESSAGE IF YOU ARE UNABLE TO TOLERATE     Signed, Freada Bergeron, MD  07/01/2020 1:10 PM    [facility]

## 2020-07-01 ENCOUNTER — Encounter: Payer: Self-pay | Admitting: Cardiology

## 2020-07-01 ENCOUNTER — Ambulatory Visit (INDEPENDENT_AMBULATORY_CARE_PROVIDER_SITE_OTHER): Payer: 59 | Admitting: Cardiology

## 2020-07-01 ENCOUNTER — Other Ambulatory Visit: Payer: Self-pay

## 2020-07-01 VITALS — BP 160/82 | HR 74 | Ht 72.0 in | Wt 253.4 lb

## 2020-07-01 DIAGNOSIS — Z8249 Family history of ischemic heart disease and other diseases of the circulatory system: Secondary | ICD-10-CM | POA: Diagnosis not present

## 2020-07-01 DIAGNOSIS — R072 Precordial pain: Secondary | ICD-10-CM

## 2020-07-01 DIAGNOSIS — I10 Essential (primary) hypertension: Secondary | ICD-10-CM

## 2020-07-01 DIAGNOSIS — E1169 Type 2 diabetes mellitus with other specified complication: Secondary | ICD-10-CM | POA: Diagnosis not present

## 2020-07-01 DIAGNOSIS — E669 Obesity, unspecified: Secondary | ICD-10-CM

## 2020-07-01 DIAGNOSIS — Z79899 Other long term (current) drug therapy: Secondary | ICD-10-CM

## 2020-07-01 DIAGNOSIS — E785 Hyperlipidemia, unspecified: Secondary | ICD-10-CM

## 2020-07-01 MED ORDER — ROSUVASTATIN CALCIUM 10 MG PO TABS: 10.0000 mg | ORAL_TABLET | Freq: Every day | ORAL | 2 refills | Status: DC

## 2020-07-01 MED ORDER — VALSARTAN 80 MG PO TABS: 80.0000 mg | ORAL_TABLET | Freq: Every day | ORAL | 1 refills | Status: DC

## 2020-07-01 NOTE — Patient Instructions (Signed)
Medication Instructions:   START TAKING VALSARTAN 80 MG BY MOUTH DAILY  START TAKING ROSUVASTATIN (CRESTOR) 10 MG BY MOUTH DAILY  *If you need a refill on your cardiac medications before your next appointment, please call your pharmacy*   Lab Work:  1.) BMET SAME DAY AS YOUR CALCIUM SCORE IS SCHEDULED FOR HERE AT THE OFFICE  2.) LIPIDS IN 6 WEEKS HERE AT THE OFFICE--PLEASE COME FASTING TO THIS LAB APPOINTMENT  If you have labs (blood work) drawn today and your tests are completely normal, you will receive your results only by: Marland Kitchen MyChart Message (if you have MyChart) OR . A paper copy in the mail If you have any lab test that is abnormal or we need to change your treatment, we will call you to review the results.   Testing/Procedures:  CALCIUM SCORE TO BE DONE HERE IN THE OFFICE--SELF PAY--HAVE YOUR BMET CHECKED ON THIS DAY   Follow-Up:  6-8 MONTHS WITH DR. Johney Frame IN THE OFFICE    Other Instructions  PLEASE LET us KNOW IF YOU CANNOT TOLERATE CRESTOR--YOU CAN CALL us OR SEND [place] A MYCHART MESSAGE IF YOU ARE UNABLE TO TOLERATE

## 2020-07-08 ENCOUNTER — Other Ambulatory Visit: Payer: Self-pay

## 2020-07-08 ENCOUNTER — Other Ambulatory Visit: Payer: 59

## 2020-07-08 DIAGNOSIS — Z79899 Other long term (current) drug therapy: Secondary | ICD-10-CM

## 2020-07-08 DIAGNOSIS — Z8249 Family history of ischemic heart disease and other diseases of the circulatory system: Secondary | ICD-10-CM

## 2020-07-08 DIAGNOSIS — I10 Essential (primary) hypertension: Secondary | ICD-10-CM

## 2020-07-08 DIAGNOSIS — E785 Hyperlipidemia, unspecified: Secondary | ICD-10-CM

## 2020-07-08 DIAGNOSIS — E669 Obesity, unspecified: Secondary | ICD-10-CM

## 2020-07-08 LAB — BASIC METABOLIC PANEL
BUN/Creatinine Ratio: 14 (ref 9–20)
BUN: 13 mg/dL (ref 6–24)
CO2: 25 mmol/L (ref 20–29)
Calcium: 9.3 mg/dL (ref 8.7–10.2)
Chloride: 102 mmol/L (ref 96–106)
Creatinine, Ser: 0.95 mg/dL (ref 0.76–1.27)
Glucose: 168 mg/dL — ABNORMAL HIGH (ref 65–99)
Potassium: 4.4 mmol/L (ref 3.5–5.2)
Sodium: 140 mmol/L (ref 134–144)
eGFR: 102 mL/min/{1.73_m2} (ref >59)

## 2020-08-06 ENCOUNTER — Other Ambulatory Visit: Payer: 59 | Admitting: *Deleted

## 2020-08-06 ENCOUNTER — Ambulatory Visit (INDEPENDENT_AMBULATORY_CARE_PROVIDER_SITE_OTHER)
Admission: RE | Admit: 2020-08-06 | Discharge: 2020-08-06 | Disposition: A | Discharge status: Home / Self Care | Payer: Self-pay | Source: Ambulatory Visit | Attending: Cardiology | Admitting: Cardiology

## 2020-08-06 ENCOUNTER — Other Ambulatory Visit: Payer: Self-pay

## 2020-08-06 DIAGNOSIS — Z79899 Other long term (current) drug therapy: Secondary | ICD-10-CM

## 2020-08-06 DIAGNOSIS — I10 Essential (primary) hypertension: Secondary | ICD-10-CM

## 2020-08-06 DIAGNOSIS — E669 Obesity, unspecified: Secondary | ICD-10-CM

## 2020-08-06 DIAGNOSIS — Z8249 Family history of ischemic heart disease and other diseases of the circulatory system: Secondary | ICD-10-CM

## 2020-08-06 DIAGNOSIS — E785 Hyperlipidemia, unspecified: Secondary | ICD-10-CM

## 2020-08-06 DIAGNOSIS — E1169 Type 2 diabetes mellitus with other specified complication: Secondary | ICD-10-CM

## 2020-08-06 LAB — LIPID PANEL
Chol/HDL Ratio: 5.9 ratio — ABNORMAL HIGH (ref 0.0–5.0)
Cholesterol, Total: 189 mg/dL (ref 100–199)
HDL: 32 mg/dL — ABNORMAL LOW (ref >39)
LDL Chol Calc (NIH): 125 mg/dL — ABNORMAL HIGH (ref 0–99)
Triglycerides: 176 mg/dL — ABNORMAL HIGH (ref 0–149)
VLDL Cholesterol Cal: 32 mg/dL (ref 5–40)

## 2020-08-07 ENCOUNTER — Telehealth: Payer: Self-pay | Admitting: *Deleted

## 2020-08-07 DIAGNOSIS — Z79899 Other long term (current) drug therapy: Secondary | ICD-10-CM

## 2020-08-07 DIAGNOSIS — E1169 Type 2 diabetes mellitus with other specified complication: Secondary | ICD-10-CM

## 2020-08-07 DIAGNOSIS — E669 Obesity, unspecified: Secondary | ICD-10-CM

## 2020-08-07 DIAGNOSIS — Z8249 Family history of ischemic heart disease and other diseases of the circulatory system: Secondary | ICD-10-CM

## 2020-08-07 MED ORDER — ROSUVASTATIN CALCIUM 20 MG PO TABS
20.0000 mg | ORAL_TABLET | Freq: Every day | ORAL | 1 refills | Status: DC
Start: 2020-08-07 — End: 2021-04-30

## 2020-08-07 NOTE — Telephone Encounter (Signed)
Pt made aware of lab results and calcium score results per Dr. Johney Frame.  Pt aware that we will increase his crestor to 20 mg po daily and repeat his lipids in 6 weeks.  Confirmed the pharmacy of choice with the pt. Scheduled the pt for repeat lipids in 6 weeks on 09/17/20.   He is aware to come fasting to this lab appt.  Pt verbalized understanding and agrees with this plan.

## 2020-08-07 NOTE — Telephone Encounter (Signed)
-----   Message from Freada Bergeron, MD sent at 08/07/2020  1:49 PM EDT ----- His cholesterol is above goal given that his calcium score was elevated. We are aiming for an LDL<70. Can we increase his crestor to '20mg'$  daily? Repeat lipids in 6-8 weeks.

## 2020-08-07 NOTE — Telephone Encounter (Signed)
Freada Bergeron, MD  [name], LPN His calcium score is elevated at 54 which is the 92% for his age/gender matched controls. This means he has started laying down plaque in his arteries. To prevent this from worsening, we will increase his crestor to '20mg'$  daily andrepeat lipids in 6 weeks with goal LDL<70.

## 2020-09-17 ENCOUNTER — Other Ambulatory Visit: Payer: 59

## 2021-02-03 ENCOUNTER — Other Ambulatory Visit: Payer: Self-pay

## 2021-02-03 ENCOUNTER — Ambulatory Visit (INDEPENDENT_AMBULATORY_CARE_PROVIDER_SITE_OTHER): Payer: 59 | Admitting: Student

## 2021-02-03 ENCOUNTER — Encounter: Payer: Self-pay | Admitting: Student

## 2021-02-03 VITALS — BP 176/95 | HR 87 | Temp 98.3°F | Ht 72.0 in | Wt 252.4 lb

## 2021-02-03 DIAGNOSIS — H538 Other visual disturbances: Secondary | ICD-10-CM

## 2021-02-03 DIAGNOSIS — E1139 Type 2 diabetes mellitus with other diabetic ophthalmic complication: Secondary | ICD-10-CM

## 2021-02-03 DIAGNOSIS — Z9119 Patient's noncompliance with other medical treatment and regimen due to financial hardship: Secondary | ICD-10-CM

## 2021-02-03 DIAGNOSIS — Z8249 Family history of ischemic heart disease and other diseases of the circulatory system: Secondary | ICD-10-CM

## 2021-02-03 DIAGNOSIS — Z79899 Other long term (current) drug therapy: Secondary | ICD-10-CM

## 2021-02-03 DIAGNOSIS — E0844 Diabetes mellitus due to underlying condition with diabetic amyotrophy: Secondary | ICD-10-CM | POA: Diagnosis not present

## 2021-02-03 DIAGNOSIS — I10 Essential (primary) hypertension: Secondary | ICD-10-CM

## 2021-02-03 DIAGNOSIS — E669 Obesity, unspecified: Secondary | ICD-10-CM

## 2021-02-03 DIAGNOSIS — Z794 Long term (current) use of insulin: Secondary | ICD-10-CM

## 2021-02-03 DIAGNOSIS — E1169 Type 2 diabetes mellitus with other specified complication: Secondary | ICD-10-CM | POA: Diagnosis not present

## 2021-02-03 DIAGNOSIS — E785 Hyperlipidemia, unspecified: Secondary | ICD-10-CM

## 2021-02-03 DIAGNOSIS — Z7984 Long term (current) use of oral hypoglycemic drugs: Secondary | ICD-10-CM

## 2021-02-03 LAB — POCT GLYCOSYLATED HEMOGLOBIN (HGB A1C): Hemoglobin A1C: 13.4 % — AB (ref 4.0–5.6)

## 2021-02-03 LAB — GLUCOSE, CAPILLARY: Glucose-Capillary: 458 mg/dL — ABNORMAL HIGH (ref 70–99)

## 2021-02-03 MED ORDER — VALSARTAN 80 MG PO TABS: 80.0000 mg | ORAL_TABLET | Freq: Every day | ORAL | 1 refills | Status: DC

## 2021-02-03 MED ORDER — INSULIN GLARGINE 100 UNITS/ML SOLOSTAR PEN: 10.0000 [IU] | PEN_INJECTOR | Freq: Every day | SUBCUTANEOUS | 11 refills | Status: DC

## 2021-02-03 MED ORDER — PEN NEEDLES 32G X 4 MM MISC: 1.0000 [IU] | Freq: Every day | 3 refills | Status: DC

## 2021-02-03 NOTE — Patient Instructions (Signed)
It was a pleasure seeing you in clinic. Today we discussed:   Diabetes: Please start lantus 10 units daily, please follow up with the clinical pharmacist and diabetic coordinator  Hight blood pressure: Please start valsartan 80 mg daily  Blurred vision: I have placed a referral for the eye doctor you should be called to make an appointment  Follow up in 2 weeks for diabetes  If you have any questions or concerns, please call our clinic at [phone] between 9am-5pm and after hours call [phone] and ask for the internal medicine resident on call. If you feel you are having a medical emergency please call 911.   Thank you, we look forward to helping you remain healthy!

## 2021-02-04 ENCOUNTER — Other Ambulatory Visit (HOSPITAL_COMMUNITY): Payer: Self-pay

## 2021-02-04 DIAGNOSIS — H538 Other visual disturbances: Secondary | ICD-10-CM

## 2021-02-04 HISTORY — DX: Other visual disturbances: H53.8

## 2021-02-04 MED ORDER — INSULIN DETEMIR 100 UNIT/ML FLEXPEN: 10.0000 [IU] | Freq: Every day | SUBCUTANEOUS | 2 refills | Status: DC

## 2021-02-04 MED ORDER — EMPAGLIFLOZIN 10 MG PO TABS: 10.0000 mg | ORAL_TABLET | Freq: Every day | ORAL | 2 refills | Status: AC

## 2021-02-04 NOTE — Progress Notes (Signed)
° °  CC: blurred vision in right eye  HPI:  Shaun Cummings is a 46 y.o. male with a past medical history of controlled diabetes and hypertension presents for blurred vision. Has not followed in clinic since 2020. Please refer to problem based charting for further details and assessment and plan of current problem and chronic medical conditions.  Past Medical History:  Diagnosis Date   Chest pain    Hyperlipidemia    Lesion of penis    foreskin   OSA (obstructive sleep apnea)    per pt dx osa and used cpap but after losing wt. from 415 pounds down to 244 pounds no longer needs cpap   Peripheral neuropathy    Phimosis    Scrotal abscess 05/08/2017   Type 2 diabetes mellitus ([place])    per pt no meds for two years pt was changed to levemir unable to afford but pt states is waiting on approval for a program he applied for that will pay for his meds [facility])     Review of Systems:  Negative as per HPI  Physical Exam:  Vitals:   02/03/21 1436  BP: (!) 176/95  Pulse: 87  Temp: 98.3 F (36.8 C)  TempSrc: Oral  SpO2: 99%  Weight: 252 lb 6.4 oz (114.5 kg)  Height: 6' (1.829 m)   Physical Exam Constitutional:      Appearance: Normal appearance. He is obese.  HENT:     Head: Normocephalic and atraumatic.  Eyes:     Extraocular Movements: Extraocular movements intact.     Conjunctiva/sclera: Conjunctivae normal.     Pupils: Pupils are equal, round, and reactive to light.     Comments: Grossly normal vision in the left eye.  Central vision of the right eyes diminished  Cardiovascular:     Rate and Rhythm: Normal rate and regular rhythm.  Pulmonary:     Effort: Pulmonary effort is normal.     Breath sounds: Normal breath sounds. No wheezing.  Abdominal:     General: Abdomen is flat. Bowel sounds are normal.     Palpations: Abdomen is soft.  Musculoskeletal:        General: Normal range of motion.     Comments: 1+ pitting edema bilateral ankles  Skin:    General:  Skin is warm and dry.  Neurological:     General: No focal deficit present.     Mental Status: He is alert and oriented to person, place, and time.     Assessment & Plan:   See Encounters Tab for problem based charting.  Patient discussed with Dr.  Cain Sieve

## 2021-02-04 NOTE — Progress Notes (Signed)
Internal Medicine Clinic Attending  I saw and evaluated the patient.  I personally confirmed the key portions of the history and exam documented by Dr. Liang and I reviewed pertinent patient test results.  The assessment, diagnosis, and plan were formulated together and I agree with the documentation in the resident's note.  

## 2021-02-04 NOTE — Assessment & Plan Note (Signed)
Patient with history of hypertension and is not currently on any medications.  Was previously on amlodipine but had swelling in his bilateral feet and therefore stopped taking this.  He was started on valsartan by cardiology in June but never picked this up.  BP elevated today 176/95 and continued to be elevated on repeat.  Plan Start valsartan 80 mg daily Follow-up BP in 2 weeks

## 2021-02-04 NOTE — Assessment & Plan Note (Signed)
Patient presents with blurred vision in his right eye.  He feels that this is slowly progressed over the course of 6 months.  First noticed having difficulty seeing while driving at night.  Now has progressed to having blurred vision even in the daylight.  He feels the symptoms are limited to the right eye with most of his vision loss in the central areas and preserved peripheral vision.  He did see to see a optometrist several months ago for new prescription for his glasses.  He was told that there are many broken blood vessels in the back of his high.  He should follow-up with ophthalmology.  However he did not follow-up given he was uninsured at that time.  Patient recently has gotten insurance with [employer] as well as [employer] for eye and dental care.  Given his history of uncontrolled diabetes and hypertension suspect he may have retinopathy related to either these conditions.  We will start on medications for diabetes and hypertension today as he has not been on medications for several months at the least.  Discussed that we will also place referral for ophthalmology for better evaluation of the etiology of his blurred vision.  Plan Referral placed for ophthalmology

## 2021-02-04 NOTE — Assessment & Plan Note (Signed)
Patient with history of uncontrolled diabetes.  Currently takes glipizide when he feels her sugars are high which she estimates twice a week.  Is also on Novolin 25 units in the mornings as well as Novolin mealtime 9 units 3 times a day which she has not taken since prior to Thanksgiving.  States he does not like having to do frequent injections as it makes him feel like he is "shooting up".  A1c today is 13.4 with blood glucose of 458.  Reports he also has frequent urination and feels are high.  He does have a glucose monitor but does not check his blood sugars regularly.  He has had difficulty affording medications in the past secondary to not having insurance but we insurance plan as well as Aet[employer].  Notes that he has a callus on his right foot that he periodically shaves down due to discomfort of having thickened skin there.  Does note that there is a small area of redness underneath the callus and he does not shave below this as he is concerned that it may bleed.  Denies any redness, pain, swelling, fevers, chills associated with this.  I discussed with Hortencia Pilar concerning medications that are covered under his insurance.  Jardiance and Levemir are both covered by insurance and co-pay should be around $15 a month.  We will start him on both these medications with close follow-up for better diabetes control.  Emphasized importance of having good diabetic control with the patient to prevent further complications of diabetes.  Given patient has had difficulty with medication compliance for past as well as stating he only takes medicines when he feels his sugars are high, and recent changes in his vision would benefit from further diabetes education emphasized importance of good diabetes control to prevent further complications from his diabetes.  Plan Start Levemir 10 units daily Start Jardiance 10 mg daily Foot exam performed today Referral to clinical pharmacist Referral to diabetes  coordinator Follow-up in 2 weeks

## 2021-02-05 ENCOUNTER — Telehealth: Payer: Self-pay | Admitting: *Deleted

## 2021-02-05 ENCOUNTER — Other Ambulatory Visit: Payer: Self-pay | Admitting: Student

## 2021-02-05 MED ORDER — INSULIN DETEMIR 100 UNIT/ML FLEXPEN: 10.0000 [IU] | Freq: Every day | SUBCUTANEOUS | 2 refills | Status: DC

## 2021-02-05 NOTE — Telephone Encounter (Signed)
Rx was refilled as "Print"; please re-send rx electronically. Thanks

## 2021-02-05 NOTE — Telephone Encounter (Signed)
Levemir rx faxed to [place] on [address].

## 2021-02-05 NOTE — Telephone Encounter (Signed)
Refill Request   insulin detemir (LEVEMIR) 100 unit/ml SOLN

## 2021-02-05 NOTE — Telephone Encounter (Signed)
Call from patient did not get prescription for Levemir from yesterdays visit.  Call to patient's pharmacy [facility]..  Did not receive prescription.  Prescription for was given verbally to pharmacist  Levemir 100 units./ml.  Dispense 3 ml.  10 units at bedtime with 2 refills.

## 2021-02-18 ENCOUNTER — Ambulatory Visit (INDEPENDENT_AMBULATORY_CARE_PROVIDER_SITE_OTHER): Payer: 59 | Admitting: Dietician

## 2021-02-18 ENCOUNTER — Encounter: Payer: Self-pay | Admitting: Dietician

## 2021-02-18 DIAGNOSIS — E1139 Type 2 diabetes mellitus with other diabetic ophthalmic complication: Secondary | ICD-10-CM

## 2021-02-18 DIAGNOSIS — Z794 Long term (current) use of insulin: Secondary | ICD-10-CM | POA: Diagnosis not present

## 2021-02-18 LAB — GLUCOSE, CAPILLARY: Glucose-Capillary: 223 mg/dL — ABNORMAL HIGH (ref 70–99)

## 2021-02-18 NOTE — Progress Notes (Signed)
I reviewed and approve this note and the plan. Debera Lat, RD 02/18/2021 3:47 PM.

## 2021-02-18 NOTE — Progress Notes (Signed)
° °  Eye exam referral expitite

## 2021-02-18 NOTE — Patient Instructions (Signed)
The plan it to wear the Continuous glucose monitor and that can help [place] determine if more insulin or a second injection of insulin.   We discussed eating nuts more often than chips.  Call with any question or concerns  Butch Penny [phone]

## 2021-02-18 NOTE — Progress Notes (Signed)
Medical Nutrition Therapy Via Telemedicine  Appt start time: 0930a.m. end time:  1025a.m.  Total time: 51min Visit # 1   Assessment:  Primary concerns today: Patient states that he has recently started on 10 units of levemir and is wondering if it is enough due to tingling in his feet due to neuropathy. He states he had not been checking his blood sugar levels consistently due to discomfort of pricking and stopped wearing previous Continuous glucose monitoring due to inconsistencies. Patient states he is not looking to lose much more weight (`20 more pounds) and would like to make healthier meal choices. 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 Patient states he has trouble seeing out of right eye due to diabetes, was denied an eye prescription and recommended to see an ophthalmologist.Patient states both his parents have diabetes and he was diagnosed with type 2 diabetes at 46 years of age. Patient was ambivalent on making some changes (potato chips) and very clear on others.   Preferred Learning Style: no preference indicated   Learning Readiness: Making changes  ANTHROPOMETRICS:  Estimated body mass index is 32.85 kg/m as calculated from the following:   Height as of 02/03/21: 6' (1.829 m).   Weight as of this encounter: 242 lb 3.2 oz (109.9 kg).  Wt Readings from Last 10 Encounters:  02/18/21 242 lb 3.2 oz (109.9 kg)  02/03/21 252 lb 6.4 oz (114.5 kg)  07/01/20 253 lb 6.4 oz (114.9 kg)  06/18/20 247 lb (112 kg)  06/10/20 245 lb (111.1 kg)  09/08/19 230 lb (104.3 kg)  03/28/18 250 lb 11.2 oz (113.7 kg)  03/24/18 247 lb 2.2 oz (112.1 kg)  03/14/18 257 lb 11.2 oz (116.9 kg)  03/09/18 258 lb 11.2 oz (117.3 kg)    WEIGHT HISTORY: Highest: 485# Lowest: 242# SLEEP: need to assess at future visit   MEDICATIONS: levemir and jardiance  BLOOD SUGAR:  Lab Results  Component Value Date   HGBA1C 13.4 (A) 02/03/2021   HGBA1C 10.0 (H) 06/18/2020   HGBA1C >14.0  03/09/2018    DIETARY INTAKE:  Usual eating pattern includes 3 meals and 1 snacks per day. Everyday foods include: chips (1/w), peanuts, fruit (oranges, pomegranate 2/w) Avoided foods include grapes.  Food Intolerances: n/a Nausea: n/a,  Vomiting: n/a, Diarrhea: n/a, Constipation: n/a, Any hair loss: n/a Dining Out (times/week): did not specify 24-hr recall:  B ( 8 AM): 2 scrambled eggs with butter, 10 air fried tater tots, toast with butter and ketchup; coffee w/ milk or cream and 1 splenda.   L ( PM): [place] food: 1c fried rice, 5 shrimp, 3pc broccoli w/ soy and duck sauce; water Snk (varies): 1 small bag of chips D (7-8 PM): 1c yellow rice, 1/4c steak, 1/4c shrimp; water Beverages: 8 bottles of water per day, coffee, 43fl oz of diet dr. Malachi Bonds or diet coke 3/w  Usual physical activity: none due to work schedule; 3rd shift   Estimated energy needs: 2236-2440 calories   Progress Towards Goal(s):  In progress.   Nutritional Diagnosis:  Knowledge deficit as related to unclear understanding of blood glucose and sensor glucose as evidenced by patient lack of self-monitoring blood glucose levels.      Intervention:  Nutrition 1.Discussed with patient the difference between blood glucose from finger sticks and sensor glucose from Continuous glucose monitoring. 2. Set patient up with Continuous glucose monitoring (Freestyle Libre 3).   Action Goal: Monitor blood glucose levels with Continuous glucose monitoring; make small  diet changes (reducing the amount of carbohydrates at breakfast).  Outcome goal: Regulate blood glucose levels.  Coordination of care: Communication with doctor to adjust insulin dosage  Teaching Method Utilized: Visual, Auditory,Hands on Handouts given during visit include:Continuous glucose monitoring and information  Barriers to learning/adherence to lifestyle change: none Demonstrated degree of understanding via:  Teach Back   Monitoring/Evaluation:  Dietary  intake, exercise, blood glucose levels, and body weight  03/18/2021 at 915a.m.    Nichole Raynor 02/18/2021 3:34 PM.

## 2021-02-25 ENCOUNTER — Telehealth: Payer: Self-pay | Admitting: Dietician

## 2021-02-25 NOTE — Telephone Encounter (Signed)
Left voicemail per Dr. Lisabeth Devoid to increase Levemir to 15 units daily, be sure to take jardiance and requested a return call to confirm dose change and eye exam appointment.

## 2021-02-25 NOTE — Telephone Encounter (Signed)
Patient confirmed his new dose of Levemir and that is is taking Jardiance. He is aware of his eye exam appointment.

## 2021-02-28 ENCOUNTER — Ambulatory Visit (INDEPENDENT_AMBULATORY_CARE_PROVIDER_SITE_OTHER): Payer: 59 | Admitting: Ophthalmology

## 2021-02-28 ENCOUNTER — Encounter (INDEPENDENT_AMBULATORY_CARE_PROVIDER_SITE_OTHER): Payer: Self-pay | Admitting: Ophthalmology

## 2021-02-28 ENCOUNTER — Other Ambulatory Visit: Payer: Self-pay

## 2021-02-28 DIAGNOSIS — H33323 Round hole, bilateral: Secondary | ICD-10-CM | POA: Diagnosis not present

## 2021-02-28 DIAGNOSIS — H35413 Lattice degeneration of retina, bilateral: Secondary | ICD-10-CM

## 2021-02-28 DIAGNOSIS — E113413 Type 2 diabetes mellitus with severe nonproliferative diabetic retinopathy with macular edema, bilateral: Secondary | ICD-10-CM | POA: Diagnosis not present

## 2021-02-28 DIAGNOSIS — H35033 Hypertensive retinopathy, bilateral: Secondary | ICD-10-CM | POA: Diagnosis not present

## 2021-02-28 DIAGNOSIS — I10 Essential (primary) hypertension: Secondary | ICD-10-CM | POA: Diagnosis not present

## 2021-02-28 LAB — HM DIABETES EYE EXAM

## 2021-02-28 MED ORDER — BEVACIZUMAB CHEMO INJECTION 1.25MG/0.05ML SYRINGE FOR KALEIDOSCOPE
1.2500 mg | INTRAVITREAL | Status: AC | PRN
  Administered 2021-02-28: 1.25 mg via INTRAVITREAL

## 2021-02-28 NOTE — Progress Notes (Signed)
[facility] Note  02/28/2021     CHIEF COMPLAINT Patient presents for Diabetic Eye Exam   HISTORY OF PRESENT ILLNESS: Shaun Cummings is a 46 y.o. male who presents to the clinic today for:   HPI     Diabetic Eye Exam   Diabetes characteristics include Type 2.  This started 15 years ago.  Blood sugar level fluctuates.  Last Blood Glucose 167 (This am).  Last A1C 13 (January 2023).  Associated Diagnosis Neuropathy.  I, the attending physician,  performed the HPI with the patient and updated documentation appropriately.        Comments   Patient referred by Dr. Katy Fitch for diabetic retinopathy. Vision slowly decreasing OD, especially in the center. Peripheral vision seems OK OD. Vision also blurred OS, but better than vision in OD.       Last edited by Bernarda Caffey, MD on 02/28/2021  4:05 PM.    Pt is here on the referral of Gwenlyn Perking / [facility] for concern of diabetic retinopathy, pt states he just started seeing on interval medicine dr who sent him to see Dr. Katy Fitch, pt states he has been diabetic for almost 20 years, pt states there was a period of time where he did not have insurance and was not taking his medication as directed, he states he got insurance in the middle of 2022 and started taking insulin again, his last A1c was 13.4 on 01.09.23 and blood glucose was 167 this morning, pt states he has noticed a decline in vision over the past 6 months, pts dad he had to have surgery due to diabetes retinopathy and he eventually went blind  Referring physician: Shirleen Schirmer, PA-C  [facility] [address] [place],  [place] [address]  HISTORICAL INFORMATION:   Selected notes from the MEDICAL RECORD NUMBER Referred by Gwenlyn Perking / [facility] for concern of diabetic retinopathy LEE:  Ocular Hx- PMH-    CURRENT MEDICATIONS: No current outpatient medications on file. (Ophthalmic Drugs)   No current facility-administered  medications for this visit. (Ophthalmic Drugs)   Current Outpatient Medications (Other)  Medication Sig   empagliflozin (JARDIANCE) 10 MG TABS tablet Take 1 tablet (10 mg total) by mouth daily before breakfast.   insulin detemir (LEVEMIR) 100 unit/ml SOLN Inject 10 Units into the skin at bedtime. (Patient taking differently: Inject 15 Units into the skin at bedtime. Per Dr. Lisabeth Devoid)   Insulin Pen Needle (PEN NEEDLES) 32G X 4 MM MISC 1 Units by Does not apply route daily.   rosuvastatin (CRESTOR) 20 MG tablet Take 1 tablet (20 mg total) by mouth daily.   valsartan (DIOVAN) 80 MG tablet Take 1 tablet (80 mg total) by mouth daily.   acetaminophen (TYLENOL) 325 MG tablet Take 2 tablets (650 mg total) by mouth every 6 (six) hours as needed for mild pain (or temp > 100).   amitriptyline (ELAVIL) 25 MG tablet Take 25 mg by mouth at bedtime. (Patient not taking: Reported on 02/28/2021)   ibuprofen (ADVIL) 600 MG tablet Take 600 mg by mouth every 6 (six) hours as needed.   losartan-hydrochlorothiazide (HYZAAR) 50-12.5 MG tablet Take 1 tablet by mouth daily. (Patient not taking: Reported on 02/28/2021)   No current facility-administered medications for this visit. (Other)   REVIEW OF SYSTEMS: ROS   Positive for: Endocrine, Eyes Last edited by Roselee Nova D, COT on 02/28/2021  1:17 PM.     ALLERGIES Allergies  Allergen Reactions   Augmentin [Amoxicillin-Pot Clavulanate] Hives and Itching    Has patient had a PCN reaction causing immediate rash, facial/tongue/throat swelling, SOB or lightheadedness with hypotension: Yes Has patient had a PCN reaction causing severe rash involving mucus membranes or skin necrosis: No Has patient had a PCN reaction that required hospitalization: No Has patient had a PCN reaction occurring within the last 10 years: No If all of the above answers are "NO", then may proceed with Cephalosporin use.   Contrast Media [Iodinated Contrast Media] Hives    Pt broke out with 1 hive  on forehead after CT injection-treated with 25mg  Benadryl   PAST MEDICAL HISTORY Past Medical History:  Diagnosis Date   Chest pain    Hyperlipidemia    Lesion of penis    foreskin   OSA (obstructive sleep apnea)    per pt dx osa and used cpap but after losing wt. from 415 pounds down to 244 pounds no longer needs cpap   Peripheral neuropathy    Phimosis    Scrotal abscess 05/08/2017   Type 2 diabetes mellitus ([place])    per pt no meds for two years pt was changed to levemir unable to afford but pt states is waiting on approval for a program he applied for that will pay for his meds ([place])     Past Surgical History:  Procedure Laterality Date   CIRCUMCISION N/A 02/10/2016   Procedure: CIRCUMCISION ADULT;  Surgeon: Cleon Gustin, MD;  Location: [facility];  Service: Urology;  Laterality: N/A;   INCISION AND DRAINAGE ABSCESS Right 05/12/2017   Procedure: INCISION AND DRAINAGE RIGHT INGUINAL ABSCESS;  Surgeon: Erroll Luna, MD;  Location: [facility];  Service: General;  Laterality: Right;   INCISION AND DRAINAGE ABSCESS N/A 03/24/2018   Procedure: INCISION AND DRAINAGE POSTERIOR NECK ABSCESS;  Surgeon: Excell Seltzer, MD;  Location: [facility];  Service: General;  Laterality: N/A;   INCISION AND DRAINAGE ABSCESS Right 06/19/2020   Procedure: INCISION AND DRAINAGE ABSCESS, RIGHT GROIN;  Surgeon: Johnathan Hausen, MD;  Location: [facility];  Service: General;  Laterality: Right;   INCISION AND DRAINAGE PERIRECTAL ABSCESS Right 05/09/2017   Procedure: IRRIGATION AND DEBRIDEMENT PERINEAL ABSCESS;  Surgeon: Donnie Mesa, MD;  Location: [facility];  Service: General;  Laterality: Right;   NO PAST SURGERIES     FAMILY HISTORY Family History  Problem Relation Age of Onset   Diabetes Mother    Angina Mother    CAD Mother    Glaucoma Father    Diabetes Father    Heart attack Father    Kidney failure Father    Diabetes Sister    Diabetes Brother    Glaucoma Maternal  Grandmother    Diabetes Maternal Grandmother    Glaucoma Maternal Grandfather    Diabetes Maternal Grandfather    Glaucoma Paternal Grandmother    Diabetes Paternal Grandmother    Glaucoma Paternal Grandfather    Diabetes Paternal Grandfather    SOCIAL HISTORY Social History   Tobacco Use   Smoking status: Former    Years: 2.00    Types: Cigarettes    Quit date: 02/05/2008    Years since quitting: 13.0   Smokeless tobacco: Never  Vaping Use   Vaping Use: Never used  Substance Use Topics   Alcohol use: No   Drug use: No       OPHTHALMIC EXAM:  Base Eye Exam     Visual Acuity (Snellen - Linear)  Right Left   Dist cc 20/150 -2 20/60 -1   Dist ph cc NI NI    Correction: Glasses         Tonometry (Tonopen, 1:28 PM)       Right Left   Pressure 20 20         Pupils       Dark Light Shape React APD   Right 8 8 Round None None   Left 8 8 Round None None  Dilated OU        Visual Fields (Counting fingers)       Left Right    Full Full         Extraocular Movement       Right Left    Full, Ortho Full, Ortho         Neuro/Psych     Oriented x3: Yes   Mood/Affect: Normal         Dilation     Both eyes: 1.0% Mydriacyl, 2.5% Phenylephrine @ 1:28 PM           Slit Lamp and Fundus Exam     Slit Lamp Exam       Right Left   Lids/Lashes Dermatochalasis - upper lid Dermatochalasis - upper lid   Conjunctiva/Sclera White and quiet White and quiet   Cornea Clear Clear   Anterior Chamber Deep and quiet Deep and quiet   Iris Round and dilated, No NVI Round and dilated, No NVI   Lens Clear Clear   Anterior Vitreous Vitreous syneresis Vitreous syneresis         Fundus Exam       Right Left   Disc Pink and Sharp, no NVD Pink and Sharp, no NVD   C/D Ratio 0.3 0.3   Macula Blunted foveal reflex, scattered MA/DBH, scattered exudates, +edema greatest centrally, +CWS Blunted foveal reflex, scattered MA/DBH, scattered exudates, +CWS    Vessels attenuated, Tortuous, AV crossing changes, ?early NVE attenuated, Tortuous, AV crossing changes   Periphery Attached, lattice degeneration with atrophic holes from 1030-1100 Attached, lattice degeneration with atrophic holes from 1030-1100           Refraction     Wearing Rx       Sphere Cylinder Axis   Right -2.25 Sphere    Left -4.75 +0.75 170         Manifest Refraction       Dist VA   Right NI   Left 20/50-2            IMAGING AND PROCEDURES  Imaging and Procedures for 02/28/2021  OCT, Retina - OU - Both Eyes       Right Eye Quality was good. Central Foveal Thickness: 612. Progression has no prior data. Findings include abnormal foveal contour, intraretinal fluid, intraretinal hyper-reflective material, no SRF, vitreomacular adhesion (+DME with prominent central cyst).   Left Eye Quality was good. Central Foveal Thickness: 314. Progression has no prior data. Findings include abnormal foveal contour, intraretinal hyper-reflective material, intraretinal fluid (Scattered cystic changes greatest greatest centrally).   Notes *Images captured and stored on drive  Diagnosis / Impression:  +DME OU (OD > OS)  Clinical management:  See below  Abbreviations: NFP - Normal foveal profile. CME - cystoid macular edema. PED - pigment epithelial detachment. IRF - intraretinal fluid. SRF - subretinal fluid. EZ - ellipsoid zone. ERM - epiretinal membrane. ORA - outer retinal atrophy. ORT - outer retinal tubulation. SRHM - subretinal hyper-reflective material. IRHM -  intraretinal hyper-reflective material      Intravitreal Injection, Pharmacologic Agent - OD - Right Eye       Time Out 02/28/2021. 2:09 PM. Confirmed correct patient, procedure, site, and patient consented.   Anesthesia Topical anesthesia was used. Anesthetic medications included Lidocaine 2%, Proparacaine 0.5%.   Procedure Preparation included 5% betadine to ocular surface, eyelid speculum. A  supplied needle was used.   Injection: 1.25 mg Bevacizumab 1.25mg /0.67ml   Route: Intravitreal, Site: Right Eye   NDC: H061816, Lot: [id number] , Expiration date: 04/09/2021   Post-op Post injection exam found visual acuity of at least counting fingers. The patient tolerated the procedure well. There were no complications. The patient received written and verbal post procedure care education. Post injection medications were not given.            ASSESSMENT/PLAN:    ICD-10-CM   1. 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 Retinal hole of both eyes  H33.323     4. Essential hypertension  I10     5. Hypertensive retinopathy of both eyes  H35.033       1. Severe Non-proliferative diabetic retinopathy, both eyes - The incidence, risk factors for progression, natural history and treatment options for diabetic retinopathy were discussed with patient.   - The need for close monitoring of blood glucose, blood pressure, and serum lipids, avoiding cigarette or any type of tobacco, and the need for long term follow up was also discussed with patient. - exam shows scattered MA, DBH OU; +edema OU (OD > OS) - OCT shows diabetic macular edema, both eyes  - The natural history, pathology, and characteristics of diabetic macular edema discussed with patient.  A generalized discussion of the major clinical trials concerning treatment of diabetic macular edema (ETDRS, DCT, SCORE, RISE / RIDE, and ongoing DRCR net studies) was completed.  This discussion included mention of the various approaches to treating diabetic macular edema (observation, laser photocoagulation, anti-VEGF injections with lucentis / Avastin /  Eylea, steroid injections with Kenalog / Ozurdex, and intraocular surgery with vitrectomy).  The goal hemoglobin A1C of 6-7 was discussed, as well as importance of smoking cessation and hypertension control.  Need for ongoing treatment and monitoring were specifically discussed with reference to chronic nature of diabetic macular edema. - recommend IVA #1 OD today, 02.03.23 for DME - pt wishes to proceed - RBA of procedure discussed, questions answered - informed consent obtained and signed - see procedure note - f/u 1 week, DFE, OCT, Optos FA (transit OD)  2,3. Lattice degeneration w/ atrophic holes, both eyes - OD: lattice with atrophic holes from 1030-1100 - OS: lattice with atrophic holes at 1100 - discussed findings, prognosis, and treatment options including observation - recommend laser retinopexy OU, OD first, but pts insurance requires prior authorization for procedure  4,5. Hypertensive retinopathy OU - discussed importance of tight BP control - monitor  Ophthalmic Meds Ordered this visit:  Meds ordered this encounter  Medications   Bevacizumab (AVASTIN) SOLN 1.25 mg     Return in about 4 days (around 03/04/2021) for f/u NPDR OU, DFE, OCT.  There are no Patient Instructions on file for this visit.  Explained the  diagnoses, plan, and follow up with the patient and they expressed understanding.  Patient expressed understanding of the importance of proper follow up care.   This document serves as a record of services personally performed by Gardiner Sleeper, MD, PhD. It was created on their behalf by San Jetty. Owens Shark, OA an ophthalmic technician. The creation of this record is the provider's dictation and/or activities during the visit.    Electronically signed by: San Jetty. Owens Shark, [place] 02.03.2023 4:19 PM  Gardiner Sleeper, M.D., Ph.D. Diseases & Surgery of the Retina and Vitreous [facility]  I have reviewed the above documentation for accuracy and  completeness, and I agree with the above. Gardiner Sleeper, M.D., Ph.D. 02/28/21 4:19 PM   Abbreviations: M myopia (nearsighted); A astigmatism; H hyperopia (farsighted); P presbyopia; Mrx spectacle prescription;  CTL contact lenses; OD right eye; OS left eye; OU both eyes  XT exotropia; ET esotropia; PEK punctate epithelial keratitis; PEE punctate epithelial erosions; DES dry eye syndrome; MGD meibomian gland dysfunction; ATs artificial tears; PFAT's preservative free artificial tears; [place] nuclear sclerotic cataract; PSC posterior subcapsular cataract; ERM epi-retinal membrane; PVD posterior vitreous detachment; RD retinal detachment; DM diabetes mellitus; DR diabetic retinopathy; NPDR non-proliferative diabetic retinopathy; PDR proliferative diabetic retinopathy; CSME clinically significant macular edema; DME diabetic macular edema; dbh dot blot hemorrhages; CWS cotton wool spot; POAG primary open angle glaucoma; C/D cup-to-disc ratio; HVF humphrey visual field; GVF goldmann visual field; OCT optical coherence tomography; IOP intraocular pressure; BRVO Branch retinal vein occlusion; CRVO central retinal vein occlusion; CRAO central retinal artery occlusion; BRAO branch retinal artery occlusion; RT retinal tear; SB scleral buckle; PPV pars plana vitrectomy; VH Vitreous hemorrhage; PRP panretinal laser photocoagulation; IVK intravitreal kenalog; VMT vitreomacular traction; MH Macular hole;  NVD neovascularization of the disc; NVE neovascularization elsewhere; AREDS age related eye disease study; ARMD age related macular degeneration; POAG primary open angle glaucoma; EBMD epithelial/anterior basement membrane dystrophy; ACIOL anterior chamber intraocular lens; IOL intraocular lens; PCIOL posterior chamber intraocular lens; Phaco/IOL phacoemulsification with intraocular lens placement; [place] photorefractive keratectomy; LASIK laser assisted in situ keratomileusis; HTN hypertension; DM diabetes mellitus; COPD chronic  obstructive pulmonary disease

## 2021-03-03 NOTE — Progress Notes (Shared)
[facility] Note  03/04/2021     CHIEF COMPLAINT Patient presents for No chief complaint on file.   HISTORY OF PRESENT ILLNESS: Shaun Cummings is a 46 y.o. male who presents to the clinic today for:    Pt is here on the referral of Gwenlyn Perking / [facility] for concern of diabetic retinopathy, pt states he just started seeing on interval medicine dr who sent him to see Dr. Katy Fitch, pt states he has been diabetic for almost 20 years, pt states there was a period of time where he did not have insurance and was not taking his medication as directed, he states he got insurance in the middle of 2022 and started taking insulin again, his last A1c was 13.4 on 01.09.23 and blood glucose was 167 this morning, pt states he has noticed a decline in vision over the past 6 months, pts dad he had to have surgery due to diabetes retinopathy and he eventually went blind  Referring physician: Shirleen Schirmer, Bunnie Philips, MD [place],  [place] [id number]  HISTORICAL INFORMATION:   Selected notes from the [place] Referred by Gwenlyn Perking / [facility] for concern of diabetic retinopathy LEE:  Ocular Hx- PMH-    CURRENT MEDICATIONS: No current outpatient medications on file. (Ophthalmic Drugs)   No current facility-administered medications for this visit. (Ophthalmic Drugs)   Current Outpatient Medications (Other)  Medication Sig   acetaminophen (TYLENOL) 325 MG tablet Take 2 tablets (650 mg total) by mouth every 6 (six) hours as needed for mild pain (or temp > 100).   amitriptyline (ELAVIL) 25 MG tablet Take 25 mg by mouth at bedtime. (Patient not taking: Reported on 02/28/2021)   empagliflozin (JARDIANCE) 10 MG TABS tablet Take 1 tablet (10 mg total) by mouth daily before breakfast.   ibuprofen (ADVIL) 600 MG tablet Take 600 mg by mouth every 6 (six) hours as needed.   insulin detemir (LEVEMIR) 100 unit/ml SOLN Inject 10 Units into the skin at  bedtime. (Patient taking differently: Inject 15 Units into the skin at bedtime. Per Dr. Lisabeth Devoid)   Insulin Pen Needle (PEN NEEDLES) 32G X 4 MM MISC 1 Units by Does not apply route daily.   losartan-hydrochlorothiazide (HYZAAR) 50-12.5 MG tablet Take 1 tablet by mouth daily. (Patient not taking: Reported on 02/28/2021)   rosuvastatin (CRESTOR) 20 MG tablet Take 1 tablet (20 mg total) by mouth daily.   valsartan (DIOVAN) 80 MG tablet Take 1 tablet (80 mg total) by mouth daily.   No current facility-administered medications for this visit. (Other)   REVIEW OF SYSTEMS:   ALLERGIES Allergies  Allergen Reactions   Augmentin [Amoxicillin-Pot Clavulanate] Hives and Itching    Has patient had a PCN reaction causing immediate rash, facial/tongue/throat swelling, SOB or lightheadedness with hypotension: Yes Has patient had a PCN reaction causing severe rash involving mucus membranes or skin necrosis: No Has patient had a PCN reaction that required hospitalization: No Has patient had a PCN reaction occurring within the last 10 years: No If all of the above answers are "NO", then may proceed with Cephalosporin use.   Contrast Media [Iodinated Contrast Media] Hives    Pt broke out with 1 hive on forehead after CT injection-treated with 25mg  Benadryl   PAST MEDICAL HISTORY Past Medical History:  Diagnosis Date   Chest pain    Hyperlipidemia    Lesion of penis    foreskin   OSA (  obstructive sleep apnea)    per pt dx osa and used cpap but after losing wt. from 415 pounds down to 244 pounds no longer needs cpap   Peripheral neuropathy    Phimosis    Scrotal abscess 05/08/2017   Type 2 diabetes mellitus ([place])    per pt no meds for two years pt was changed to levemir unable to afford but pt states is waiting on approval for a program he applied for that will pay for his meds ([place])     Past Surgical History:  Procedure Laterality Date   CIRCUMCISION N/A 02/10/2016   Procedure:  CIRCUMCISION ADULT;  Surgeon: Cleon Gustin, MD;  Location: [facility];  Service: Urology;  Laterality: N/A;   INCISION AND DRAINAGE ABSCESS Right 05/12/2017   Procedure: INCISION AND DRAINAGE RIGHT INGUINAL ABSCESS;  Surgeon: Erroll Luna, MD;  Location: [place] [facility];  Service: General;  Laterality: Right;   INCISION AND DRAINAGE ABSCESS N/A 03/24/2018   Procedure: INCISION AND DRAINAGE POSTERIOR NECK ABSCESS;  Surgeon: Excell Seltzer, MD;  Location: [facility];  Service: General;  Laterality: N/A;   INCISION AND DRAINAGE ABSCESS Right 06/19/2020   Procedure: INCISION AND DRAINAGE ABSCESS, RIGHT GROIN;  Surgeon: Johnathan Hausen, MD;  Location: [facility];  Service: General;  Laterality: Right;   INCISION AND DRAINAGE PERIRECTAL ABSCESS Right 05/09/2017   Procedure: IRRIGATION AND DEBRIDEMENT PERINEAL ABSCESS;  Surgeon: Donnie Mesa, MD;  Location: [facility];  Service: General;  Laterality: Right;   NO PAST SURGERIES     FAMILY HISTORY Family History  Problem Relation Age of Onset   Diabetes Mother    Angina Mother    CAD Mother    Glaucoma Father    Diabetes Father    Heart attack Father    Kidney failure Father    Diabetes Sister    Diabetes Brother    Glaucoma Maternal Grandmother    Diabetes Maternal Grandmother    Glaucoma Maternal Grandfather    Diabetes Maternal Grandfather    Glaucoma Paternal Grandmother    Diabetes Paternal Grandmother    Glaucoma Paternal Grandfather    Diabetes Paternal Grandfather    SOCIAL HISTORY Social History   Tobacco Use   Smoking status: Former    Years: 2.00    Types: Cigarettes    Quit date: 02/05/2008    Years since quitting: 13.0   Smokeless tobacco: Never  Vaping Use   Vaping Use: Never used  Substance Use Topics   Alcohol use: No   Drug use: No       OPHTHALMIC EXAM:  Not recorded     IMAGING AND PROCEDURES  Imaging and Procedures for 03/04/2021          ASSESSMENT/PLAN:    ICD-10-CM   1. Severe  nonproliferative diabetic retinopathy of both eyes with macular edema associated with type 2 diabetes mellitus ([place])  H67.5916     2. 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 Retinal hole of both eyes  H33.323     4. Essential hypertension  I10     5. Hypertensive retinopathy of both eyes  H35.033       1. Severe Non-proliferative diabetic retinopathy, both eyes  - s/p IVA OD #1 (02.03.22) - exam shows scattered MA, DBH OU; +edema OU (OD > OS) - OCT shows diabetic macular edema, both eyes  - recommend IVA #1 OD today, 02.03.23 for DME - pt wishes to proceed - RBA of  procedure discussed, questions answered - informed consent obtained and signed - see procedure note - f/u 1 week, DFE, OCT, Optos FA (transit OD)  2,3. Lattice degeneration w/ atrophic holes, both eyes - OD: lattice with atrophic holes from 1030-1100 - OS: lattice with atrophic holes at 1100 - discussed findings, prognosis, and treatment options including observation - recommend laser retinopexy OU, OD first, but pts insurance requires prior authorization for procedure  4,5. Hypertensive retinopathy OU - discussed importance of tight BP control - monitor  Ophthalmic Meds Ordered this visit:  No orders of the defined types were placed in this encounter.    No follow-ups on file.  There are no Patient Instructions on file for this visit.  Explained the diagnoses, plan, and follow up with the patient and they expressed understanding.  Patient expressed understanding of the importance of proper follow up care.   This document serves as a record of services personally performed by Gardiner Sleeper, MD, PhD. It was created on their behalf by San Jetty. Owens Shark, OA an ophthalmic technician. The creation of this record is the provider's dictation and/or activities during the visit.    Electronically signed by: San Jetty. Owens Shark, [place] 02.03.2023 7:59 AM  Gardiner Sleeper, M.D., Ph.D. Diseases & Surgery of the  Retina and Vitreous [facility]: M myopia (nearsighted); A astigmatism; H hyperopia (farsighted); P presbyopia; Mrx spectacle prescription;  CTL contact lenses; OD right eye; OS left eye; OU both eyes  XT exotropia; ET esotropia; PEK punctate epithelial keratitis; PEE punctate epithelial erosions; DES dry eye syndrome; MGD meibomian gland dysfunction; ATs artificial tears; PFAT's preservative free artificial tears; [place] nuclear sclerotic cataract; PSC posterior subcapsular cataract; ERM epi-retinal membrane; PVD posterior vitreous detachment; RD retinal detachment; DM diabetes mellitus; DR diabetic retinopathy; NPDR non-proliferative diabetic retinopathy; PDR proliferative diabetic retinopathy; CSME clinically significant macular edema; DME diabetic macular edema; dbh dot blot hemorrhages; CWS cotton wool spot; POAG primary open angle glaucoma; C/D cup-to-disc ratio; HVF humphrey visual field; GVF goldmann visual field; OCT optical coherence tomography; IOP intraocular pressure; BRVO Branch retinal vein occlusion; CRVO central retinal vein occlusion; CRAO central retinal artery occlusion; BRAO branch retinal artery occlusion; RT retinal tear; SB scleral buckle; PPV pars plana vitrectomy; VH Vitreous hemorrhage; PRP panretinal laser photocoagulation; IVK intravitreal kenalog; VMT vitreomacular traction; MH Macular hole;  NVD neovascularization of the disc; NVE neovascularization elsewhere; AREDS age related eye disease study; ARMD age related macular degeneration; POAG primary open angle glaucoma; EBMD epithelial/anterior basement membrane dystrophy; ACIOL anterior chamber intraocular lens; IOL intraocular lens; PCIOL posterior chamber intraocular lens; Phaco/IOL phacoemulsification with intraocular lens placement; [place] photorefractive keratectomy; LASIK laser assisted in situ keratomileusis; HTN hypertension; DM diabetes mellitus; COPD chronic obstructive pulmonary  disease

## 2021-03-04 ENCOUNTER — Encounter (INDEPENDENT_AMBULATORY_CARE_PROVIDER_SITE_OTHER): Payer: 59 | Admitting: Ophthalmology

## 2021-03-04 ENCOUNTER — Encounter (INDEPENDENT_AMBULATORY_CARE_PROVIDER_SITE_OTHER): Payer: Self-pay | Admitting: Ophthalmology

## 2021-03-04 ENCOUNTER — Ambulatory Visit (INDEPENDENT_AMBULATORY_CARE_PROVIDER_SITE_OTHER): Payer: 59 | Admitting: Ophthalmology

## 2021-03-04 ENCOUNTER — Other Ambulatory Visit: Payer: Self-pay

## 2021-03-04 DIAGNOSIS — H35413 Lattice degeneration of retina, bilateral: Secondary | ICD-10-CM

## 2021-03-04 DIAGNOSIS — I10 Essential (primary) hypertension: Secondary | ICD-10-CM

## 2021-03-04 DIAGNOSIS — E113413 Type 2 diabetes mellitus with severe nonproliferative diabetic retinopathy with macular edema, bilateral: Secondary | ICD-10-CM | POA: Diagnosis not present

## 2021-03-04 DIAGNOSIS — H33323 Round hole, bilateral: Secondary | ICD-10-CM

## 2021-03-04 DIAGNOSIS — H35033 Hypertensive retinopathy, bilateral: Secondary | ICD-10-CM

## 2021-03-04 NOTE — Progress Notes (Addendum)
[facility] Note  03/04/2021     CHIEF COMPLAINT Patient presents for Retina Follow Up   HISTORY OF PRESENT ILLNESS: Shaun Cummings is a 46 y.o. male who presents to the clinic today for:   HPI     Retina Follow Up   Patient presents with  Diabetic Retinopathy.  In both eyes.  This started 4 days ago.  I, the attending physician,  performed the HPI with the patient and updated documentation appropriately.        Comments   Patient here for 4 days retina follow up for NPDR OU. Patient states vision about the same. Feels good about it. May have a little pressure in eyes.      Last edited by Bernarda Caffey, MD on 03/05/2021 12:34 AM.    Patient believes vision is a little better since first injection.   Referring physician: Shirleen Schirmer, PA-C  Iona Beard, MD [place],  [place] [address]  HISTORICAL INFORMATION:   Selected notes from the MEDICAL RECORD NUMBER Referred by Gwenlyn Perking / [facility] for concern of diabetic retinopathy LEE:  Ocular Hx- PMH-    CURRENT MEDICATIONS: No current outpatient medications on file. (Ophthalmic Drugs)   No current facility-administered medications for this visit. (Ophthalmic Drugs)   Current Outpatient Medications (Other)  Medication Sig   empagliflozin (JARDIANCE) 10 MG TABS tablet Take 1 tablet (10 mg total) by mouth daily before breakfast.   insulin detemir (LEVEMIR) 100 unit/ml SOLN Inject 10 Units into the skin at bedtime. (Patient taking differently: Inject 15 Units into the skin at bedtime. Per Dr. Lisabeth Devoid)   Insulin Pen Needle (PEN NEEDLES) 32G X 4 MM MISC 1 Units by Does not apply route daily.   rosuvastatin (CRESTOR) 20 MG tablet Take 1 tablet (20 mg total) by mouth daily.   valsartan (DIOVAN) 80 MG tablet Take 1 tablet (80 mg total) by mouth daily.   acetaminophen (TYLENOL) 325 MG tablet Take 2 tablets (650 mg total) by mouth every 6 (six) hours as needed for mild pain (or temp >  100).   amitriptyline (ELAVIL) 25 MG tablet Take 25 mg by mouth at bedtime. (Patient not taking: Reported on 02/28/2021)   ibuprofen (ADVIL) 600 MG tablet Take 600 mg by mouth every 6 (six) hours as needed.   losartan-hydrochlorothiazide (HYZAAR) 50-12.5 MG tablet Take 1 tablet by mouth daily. (Patient not taking: Reported on 02/28/2021)   No current facility-administered medications for this visit. (Other)   REVIEW OF SYSTEMS: ROS   Positive for: Endocrine, Eyes Negative for: Constitutional, Gastrointestinal, Neurological, Skin, Genitourinary, Musculoskeletal, HENT, Cardiovascular, Respiratory, Psychiatric, Allergic/Imm, Heme/Lymph Last edited by Leonie Douglas, COA on 03/04/2021  3:15 PM.     ALLERGIES Allergies  Allergen Reactions   Augmentin [Amoxicillin-Pot Clavulanate] Hives and Itching    Has patient had a PCN reaction causing immediate rash, facial/tongue/throat swelling, SOB or lightheadedness with hypotension: Yes Has patient had a PCN reaction causing severe rash involving mucus membranes or skin necrosis: No Has patient had a PCN reaction that required hospitalization: No Has patient had a PCN reaction occurring within the last 10 years: No If all of the above answers are "NO", then may proceed with Cephalosporin use.   Contrast Media [Iodinated Contrast Media] Hives    Pt broke out with 1 hive on forehead after CT injection-treated with 25mg  Benadryl   PAST MEDICAL HISTORY Past Medical History:  Diagnosis Date   Chest pain  Hyperlipidemia    Lesion of penis    foreskin   OSA (obstructive sleep apnea)    per pt dx osa and used cpap but after losing wt. from 415 pounds down to 244 pounds no longer needs cpap   Peripheral neuropathy    Phimosis    Scrotal abscess 05/08/2017   Type 2 diabetes mellitus ([place])    per pt no meds for two years pt was changed to levemir unable to afford but pt states is waiting on approval for a program he applied for that will pay for his meds  ([place])     Past Surgical History:  Procedure Laterality Date   CIRCUMCISION N/A 02/10/2016   Procedure: CIRCUMCISION ADULT;  Surgeon: Cleon Gustin, MD;  Location: [facility];  Service: Urology;  Laterality: N/A;   INCISION AND DRAINAGE ABSCESS Right 05/12/2017   Procedure: INCISION AND DRAINAGE RIGHT INGUINAL ABSCESS;  Surgeon: Erroll Luna, MD;  Location: [facility];  Service: General;  Laterality: Right;   INCISION AND DRAINAGE ABSCESS N/A 03/24/2018   Procedure: INCISION AND DRAINAGE POSTERIOR NECK ABSCESS;  Surgeon: Excell Seltzer, MD;  Location: [facility];  Service: General;  Laterality: N/A;   INCISION AND DRAINAGE ABSCESS Right 06/19/2020   Procedure: INCISION AND DRAINAGE ABSCESS, RIGHT GROIN;  Surgeon: Johnathan Hausen, MD;  Location: [facility];  Service: General;  Laterality: Right;   INCISION AND DRAINAGE PERIRECTAL ABSCESS Right 05/09/2017   Procedure: IRRIGATION AND DEBRIDEMENT PERINEAL ABSCESS;  Surgeon: Donnie Mesa, MD;  Location: [facility];  Service: General;  Laterality: Right;   NO PAST SURGERIES     FAMILY HISTORY Family History  Problem Relation Age of Onset   Diabetes Mother    Angina Mother    CAD Mother    Glaucoma Father    Diabetes Father    Heart attack Father    Kidney failure Father    Diabetes Sister    Diabetes Brother    Glaucoma Maternal Grandmother    Diabetes Maternal Grandmother    Glaucoma Maternal Grandfather    Diabetes Maternal Grandfather    Glaucoma Paternal Grandmother    Diabetes Paternal Grandmother    Glaucoma Paternal Grandfather    Diabetes Paternal Grandfather    SOCIAL HISTORY Social History   Tobacco Use   Smoking status: Former    Years: 2.00    Types: Cigarettes    Quit date: 02/05/2008    Years since quitting: 13.0   Smokeless tobacco: Never  Vaping Use   Vaping Use: Never used  Substance Use Topics   Alcohol use: No   Drug use: No       OPHTHALMIC EXAM:  Base Eye Exam     Visual  Acuity (Snellen - Linear)       Right Left   Dist cc 20/150 20/40   Dist ph cc 20/100 -2 NI    Correction: Glasses         Tonometry (Tonopen, 1:28 PM)       Right Left   Pressure 13 13         Pupils       Dark Light Shape React APD   Right 4 3 Round Brisk None   Left 4 3 Round Brisk None         Visual Fields (Counting fingers)       Left Right    Full Full         Extraocular Movement  Right Left    Full, Ortho Full, Ortho         Neuro/Psych     Oriented x3: Yes   Mood/Affect: Normal         Dilation     Both eyes: 1.0% Mydriacyl, 2.5% Phenylephrine @ 1:27 PM           Slit Lamp and Fundus Exam     Slit Lamp Exam       Right Left   Lids/Lashes Dermatochalasis - upper lid Dermatochalasis - upper lid   Conjunctiva/Sclera White and quiet White and quiet   Cornea Clear Clear   Anterior Chamber Deep and quiet Deep and quiet   Iris Round and dilated, No NVI Round and dilated, No NVI   Lens Clear Clear   Anterior Vitreous Vitreous syneresis Vitreous syneresis         Fundus Exam       Right Left   Disc Pink and Sharp, no NVD Pink and Sharp, no NVD   C/D Ratio 0.3 0.3   Macula Blunted foveal reflex, scattered MA/DBH, scattered exudates, +edema greatest centrally, +CWS Blunted foveal reflex, scattered MA/DBH, scattered exudates, +CWS   Vessels attenuated, Tortuous, AV crossing changes, ?early NVE attenuated, Tortuous, AV crossing changes   Periphery Attached, lattice degeneration with atrophic holes from 1030-1100 Attached, lattice degeneration with atrophic holes from 1030-1100           Refraction     Wearing Rx       Sphere Cylinder Axis   Right -2.25 Sphere    Left -4.75 +0.75 170            IMAGING AND PROCEDURES  Imaging and Procedures for 03/04/2021  OCT, Retina - OU - Both Eyes       Right Eye Quality was good. Central Foveal Thickness: 421. Progression has improved. Findings include abnormal foveal  contour, intraretinal fluid, intraretinal hyper-reflective material, no SRF, vitreomacular adhesion (Interval improvement in IRF/IRHM/foveal contour).   Left Eye Quality was good. Central Foveal Thickness: 317. Progression has been stable. Findings include abnormal foveal contour, intraretinal hyper-reflective material, intraretinal fluid (Scattered cystic changes greatest centrally -- stable).   Notes *Images captured and stored on drive  Diagnosis / Impression:  +DME OU (OD > OS) OD: Interval improvement in IRF/IRHM/foveal contour OS: Scattered cystic changes greatest centrally -- stable  Clinical management:  See below  Abbreviations: NFP - Normal foveal profile. CME - cystoid macular edema. PED - pigment epithelial detachment. IRF - intraretinal fluid. SRF - subretinal fluid. EZ - ellipsoid zone. ERM - epiretinal membrane. ORA - outer retinal atrophy. ORT - outer retinal tubulation. SRHM - subretinal hyper-reflective material. IRHM - intraretinal hyper-reflective material      Fluorescein Angiography Optos (Transit OD)       Right Eye Progression has no prior data. Early phase findings include delayed filling, vascular perfusion defect, microaneurysm. Mid/Late phase findings include microaneurysm, vascular perfusion defect, leakage (No NV).   Left Eye Progression has no prior data. Early phase findings include microaneurysm, vascular perfusion defect. Mid/Late phase findings include microaneurysm, leakage, vascular perfusion defect.   Notes **Images stored on drive**  Impression: Severe NPDR OU  - Leaking MA OU  - no NV OU - Vascular perfusion defect OU - delayed filling time        Intravitreal Injection, Pharmacologic Agent - OS - Left Eye       Time Out 03/04/2021. 3:06 PM. Confirmed correct patient, procedure, site, and patient consented.  Anesthesia Topical anesthesia was used. Anesthetic medications included Lidocaine 2%, Proparacaine 0.5%.    Procedure Preparation included 5% betadine to ocular surface. A supplied needle was used.   Injection: 1.25 mg Bevacizumab 1.25mg /0.14ml   Route: Intravitreal, Site: Left Eye   NDC: 73419-379-02, Lot: [id number] , Expiration date: 04/09/2021   Post-op Post injection exam found visual acuity of at least counting fingers. The patient tolerated the procedure well. There were no complications. The patient received written and verbal post procedure care education. Post injection medications were not given.            ASSESSMENT/PLAN:    ICD-10-CM   1. 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 Retinal hole of both eyes  H33.323     4. Essential hypertension  I10     5. Hypertensive retinopathy of both eyes  H35.033 Fluorescein Angiography Optos (Transit OD)      1. Severe Non-proliferative diabetic retinopathy, both eyes - The incidence, risk factors for progression, natural history and treatment options for diabetic retinopathy were discussed with patient.   - The need for close monitoring of blood glucose, blood pressure, and serum lipids, avoiding cigarette or any type of tobacco, and the need for long term follow up was also discussed with patient. - FA 02.07.23 shows: Leaking MA, no NV, Vascular perfusion defect OU - s/p OD IVA #1 02.03.23 - VA OD 20/100 (improved from 20/150) - exam shows scattered MA, DBH OU; +edema OU (OD > OS) - OCT shows diabetic macular edema, both eyes  - recommend IVA #1 OS today, 02.07.23 for DME - pt wishes to proceed - RBA of procedure discussed, questions answered - informed consent obtained and signed OD 02.03.23 OS 02.07.23 - see procedure note - f/u 1 week,  DFE, OCT, laser OD Tue 9:45  2,3. Lattice degeneration w/ atrophic holes, both eyes - OD: lattice with atrophic holes from 1030-1100 - OS: lattice with atrophic holes at 1100 - discussed findings, prognosis, and treatment options including observation - recommend laser retinopexy OU, OD first, insurance has been approved for treatment 02.07.23 - f/u Tuesday for Retinopexy OD at 9:45  4,5. Hypertensive retinopathy OU - discussed importance of tight BP control - monitor  Ophthalmic Meds Ordered this visit:  Meds ordered this encounter  Medications   Bevacizumab (AVASTIN) SOLN 1.25 mg     Return for Return Tuesday at 9:45 for Retinopexy OD, DFE, OCT.  There are no Patient Instructions on file for this visit.  Explained the diagnoses, plan, and follow up with the patient and they expressed understanding.  Patient expressed understanding of the importance of proper follow up care.   This document serves as a record of services personally performed by Sunday, MD, PhD. It was created on their behalf by Gardiner Sleeper, an ophthalmic technician. The creation of this record is the provider's dictation and/or activities during the visit.    Electronically signed by: Leonie Douglas COA, 03/05/21  12:34 AM   This document serves as a record of services personally performed by 15/08/23, MD, PhD. It was created on their behalf by Gardiner Sleeper. San Jetty, OA an ophthalmic technician. The creation of this record  is the provider's dictation and/or activities during the visit.    Electronically signed by: San Jetty. Owens Shark, [place] 02.03.2023 12:34 AM  Gardiner Sleeper, M.D., Ph.D. Diseases & Surgery of the Retina and Vitreous [facility]  I have reviewed the above documentation for accuracy and completeness, and I agree with the above. Gardiner Sleeper, M.D., Ph.D. 02/28/21 12:34 AM   Abbreviations: M myopia (nearsighted); A astigmatism; H hyperopia (farsighted); P  presbyopia; Mrx spectacle prescription;  CTL contact lenses; OD right eye; OS left eye; OU both eyes  XT exotropia; ET esotropia; PEK punctate epithelial keratitis; PEE punctate epithelial erosions; DES dry eye syndrome; MGD meibomian gland dysfunction; ATs artificial tears; PFAT's preservative free artificial tears; [place] nuclear sclerotic cataract; PSC posterior subcapsular cataract; ERM epi-retinal membrane; PVD posterior vitreous detachment; RD retinal detachment; DM diabetes mellitus; DR diabetic retinopathy; NPDR non-proliferative diabetic retinopathy; PDR proliferative diabetic retinopathy; CSME clinically significant macular edema; DME diabetic macular edema; dbh dot blot hemorrhages; CWS cotton wool spot; POAG primary open angle glaucoma; C/D cup-to-disc ratio; HVF humphrey visual field; GVF goldmann visual field; OCT optical coherence tomography; IOP intraocular pressure; BRVO Branch retinal vein occlusion; CRVO central retinal vein occlusion; CRAO central retinal artery occlusion; BRAO branch retinal artery occlusion; RT retinal tear; SB scleral buckle; PPV pars plana vitrectomy; VH Vitreous hemorrhage; PRP panretinal laser photocoagulation; IVK intravitreal kenalog; VMT vitreomacular traction; MH Macular hole;  NVD neovascularization of the disc; NVE neovascularization elsewhere; AREDS age related eye disease study; ARMD age related macular degeneration; POAG primary open angle glaucoma; EBMD epithelial/anterior basement membrane dystrophy; ACIOL anterior chamber intraocular lens; IOL intraocular lens; PCIOL posterior chamber intraocular lens; Phaco/IOL phacoemulsification with intraocular lens placement; [place] photorefractive keratectomy; LASIK laser assisted in situ keratomileusis; HTN hypertension; DM diabetes mellitus; COPD chronic obstructive pulmonary disease  [facility] Note  03/04/2021     CHIEF COMPLAINT Patient presents for Retina Follow Up   HISTORY OF  PRESENT ILLNESS: Shaun Cummings is a 46 y.o. male who presents to the clinic today for:   HPI     Retina Follow Up   Patient presents with  Diabetic Retinopathy.  In both eyes.  This started 4 days ago.  I, the attending physician,  performed the HPI with the patient and updated documentation appropriately.        Comments   Patient here for 4 days retina follow up for NPDR OU. Patient states vision about the same. Feels good about it. May have a little pressure in eyes.      Last edited by Bernarda Caffey, MD on 03/05/2021 12:34 AM.    Pt is here on the referral of Gwenlyn Perking / [facility] for concern of diabetic retinopathy, pt states he just started seeing on interval medicine dr who sent him to see Dr. Katy Fitch, pt states he has been diabetic for almost 20 years, pt states there was a period of time where he did not have insurance and was not taking his medication as directed, he states he got insurance in the middle of 2022 and started taking insulin again, his last A1c was 13.4 on 01.09.23 and blood glucose was 167 this morning, pt states he has noticed a decline in vision over the past 6 months, pts dad he had to have surgery due to diabetes retinopathy and he eventually went blind  Referring physician: Shirleen Schirmer, Bunnie Philips, MD [address] [place],  [place] [address]  HISTORICAL INFORMATION:   Selected notes from the MEDICAL RECORD NUMBER Referred by Gwenlyn Perking / [facility] for concern of diabetic retinopathy LEE:  Ocular Hx- PMH-    CURRENT MEDICATIONS: No current outpatient medications on file. (Ophthalmic Drugs)   No current facility-administered medications for this visit. (Ophthalmic Drugs)   Current Outpatient Medications (Other)  Medication Sig   empagliflozin (JARDIANCE) 10 MG TABS tablet Take 1 tablet (10 mg total) by mouth daily before breakfast.   insulin detemir (LEVEMIR) 100 unit/ml SOLN Inject 10 Units into the skin at bedtime. (Patient taking  differently: Inject 15 Units into the skin at bedtime. Per Dr. Lisabeth Devoid)   Insulin Pen Needle (PEN NEEDLES) 32G X 4 MM MISC 1 Units by Does not apply route daily.   rosuvastatin (CRESTOR) 20 MG tablet Take 1 tablet (20 mg total) by mouth daily.   valsartan (DIOVAN) 80 MG tablet Take 1 tablet (80 mg total) by mouth daily.   acetaminophen (TYLENOL) 325 MG tablet Take 2 tablets (650 mg total) by mouth every 6 (six) hours as needed for mild pain (or temp > 100).   amitriptyline (ELAVIL) 25 MG tablet Take 25 mg by mouth at bedtime. (Patient not taking: Reported on 02/28/2021)   ibuprofen (ADVIL) 600 MG tablet Take 600 mg by mouth every 6 (six) hours as needed.   losartan-hydrochlorothiazide (HYZAAR) 50-12.5 MG tablet Take 1 tablet by mouth daily. (Patient not taking: Reported on 02/28/2021)   No current facility-administered medications for this visit. (Other)   REVIEW OF SYSTEMS: ROS   Positive for: Endocrine, Eyes Negative for: Constitutional, Gastrointestinal, Neurological, Skin, Genitourinary, Musculoskeletal, HENT, Cardiovascular, Respiratory, Psychiatric, Allergic/Imm, Heme/Lymph Last edited by Leonie Douglas, COA on 03/04/2021  3:15 PM.     ALLERGIES Allergies  Allergen Reactions   Augmentin [Amoxicillin-Pot Clavulanate] Hives and Itching    Has patient had a PCN reaction causing immediate rash, facial/tongue/throat swelling, SOB or lightheadedness with hypotension: Yes Has patient had a PCN reaction causing severe rash involving mucus membranes or skin necrosis: No Has patient had a PCN reaction that required hospitalization: No Has patient had a PCN reaction occurring within the last 10 years: No If all of the above answers are "NO", then may proceed with Cephalosporin use.   Contrast Media [Iodinated Contrast Media] Hives    Pt broke out with 1 hive on forehead after CT injection-treated with 25mg  Benadryl   PAST MEDICAL HISTORY Past Medical History:  Diagnosis Date   Chest pain     Hyperlipidemia    Lesion of penis    foreskin   OSA (obstructive sleep apnea)    per pt dx osa and used cpap but after losing wt. from 415 pounds down to 244 pounds no longer needs cpap   Peripheral neuropathy    Phimosis    Scrotal abscess 05/08/2017   Type 2 diabetes mellitus ([place])    per pt no meds for two years pt was changed to levemir unable to afford but pt states is waiting on approval for a program he applied for that will pay for his meds ([place])     Past Surgical History:  Procedure Laterality Date   CIRCUMCISION N/A 02/10/2016   Procedure: CIRCUMCISION ADULT;  Surgeon: Cleon Gustin, MD;  Location: [facility];  Service: Urology;  Laterality: N/A;   INCISION AND DRAINAGE ABSCESS Right 05/12/2017   Procedure: INCISION AND DRAINAGE RIGHT INGUINAL ABSCESS;  Surgeon: Erroll Luna, MD;  Location: [facility];  Service: General;  Laterality: Right;   INCISION AND DRAINAGE ABSCESS N/A 03/24/2018   Procedure: INCISION AND DRAINAGE POSTERIOR NECK ABSCESS;  Surgeon: Excell Seltzer, MD;  Location: [facility];  Service: General;  Laterality: N/A;   INCISION AND DRAINAGE ABSCESS Right 06/19/2020   Procedure: INCISION AND DRAINAGE ABSCESS, RIGHT GROIN;  Surgeon: Johnathan Hausen, MD;  [facility];  Service: General;  Laterality: Right;   INCISION AND DRAINAGE PERIRECTAL ABSCESS Right 05/09/2017   Procedure: IRRIGATION AND DEBRIDEMENT PERINEAL ABSCESS;  Surgeon: Donnie Mesa, MD;  Location: [facility];  Service: General;  Laterality: Right;   NO PAST SURGERIES     FAMILY HISTORY Family History  Problem Relation Age of Onset   Diabetes Mother    Angina Mother    CAD Mother    Glaucoma Father    Diabetes Father    Heart attack Father    Kidney failure Father    Diabetes Sister    Diabetes Brother    Glaucoma Maternal Grandmother    Diabetes Maternal Grandmother    Glaucoma Maternal Grandfather    Diabetes Maternal Grandfather    Glaucoma Paternal  Grandmother    Diabetes Paternal Grandmother    Glaucoma Paternal Grandfather    Diabetes Paternal Grandfather    SOCIAL HISTORY Social History   Tobacco Use   Smoking status: Former    Years: 2.00    Types: Cigarettes    Quit date: 02/05/2008    Years since quitting: 13.0   Smokeless tobacco: Never  Vaping Use   Vaping Use: Never used  Substance Use Topics   Alcohol use: No   Drug use: No       OPHTHALMIC EXAM:  Base Eye Exam     Visual Acuity (Snellen - Linear)       Right Left   Dist cc 20/150 20/40   Dist ph cc 20/100 -2 NI    Correction: Glasses         Tonometry (Tonopen, 1:28 PM)       Right Left   Pressure 13 13         Pupils       Dark Light Shape React APD   Right 4 3 Round Brisk None   Left 4 3 Round Brisk None         Visual Fields (Counting fingers)       Left Right    Full Full         Extraocular Movement       Right Left    Full, Ortho Full, Ortho         Neuro/Psych     Oriented x3: Yes   Mood/Affect: Normal         Dilation     Both eyes: 1.0% Mydriacyl, 2.5% Phenylephrine @ 1:27 PM           Slit Lamp and Fundus Exam     Slit Lamp Exam       Right Left   Lids/Lashes Dermatochalasis - upper lid Dermatochalasis - upper lid   Conjunctiva/Sclera White and quiet White and quiet   Cornea Clear Clear   Anterior Chamber Deep and quiet Deep and quiet   Iris Round and dilated, No NVI Round and dilated, No NVI   Lens Clear Clear   Anterior Vitreous Vitreous syneresis Vitreous syneresis         Fundus Exam       Right Left   Disc Pink and Sharp, no NVD Pink and Sharp,  no NVD   C/D Ratio 0.3 0.3   Macula Blunted foveal reflex, scattered MA/DBH, scattered exudates, +edema greatest centrally, +CWS Blunted foveal reflex, scattered MA/DBH, scattered exudates, +CWS   Vessels attenuated, Tortuous, AV crossing changes, ?early NVE attenuated, Tortuous, AV crossing changes   Periphery Attached, lattice  degeneration with atrophic holes from 1030-1100 Attached, lattice degeneration with atrophic holes from 1030-1100           Refraction     Wearing Rx       Sphere Cylinder Axis   Right -2.25 Sphere    Left -4.75 +0.75 170            IMAGING AND PROCEDURES  Imaging and Procedures for 03/04/2021  OCT, Retina - OU - Both Eyes       Right Eye Quality was good. Central Foveal Thickness: 421. Progression has improved. Findings include abnormal foveal contour, intraretinal fluid, intraretinal hyper-reflective material, no SRF, vitreomacular adhesion (Interval improvement in IRF/IRHM/foveal contour).   Left Eye Quality was good. Central Foveal Thickness: 317. Progression has been stable. Findings include abnormal foveal contour, intraretinal hyper-reflective material, intraretinal fluid (Scattered cystic changes greatest centrally -- stable).   Notes *Images captured and stored on drive  Diagnosis / Impression:  +DME OU (OD > OS) OD: Interval improvement in IRF/IRHM/foveal contour OS: Scattered cystic changes greatest centrally -- stable  Clinical management:  See below  Abbreviations: NFP - Normal foveal profile. CME - cystoid macular edema. PED - pigment epithelial detachment. IRF - intraretinal fluid. SRF - subretinal fluid. EZ - ellipsoid zone. ERM - epiretinal membrane. ORA - outer retinal atrophy. ORT - outer retinal tubulation. SRHM - subretinal hyper-reflective material. IRHM - intraretinal hyper-reflective material      Fluorescein Angiography Optos (Transit OD)       Right Eye Progression has no prior data. Early phase findings include delayed filling, vascular perfusion defect, microaneurysm. Mid/Late phase findings include microaneurysm, vascular perfusion defect, leakage (No NV).   Left Eye Progression has no prior data. Early phase findings include microaneurysm, vascular perfusion defect. Mid/Late phase findings include microaneurysm, leakage, vascular  perfusion defect.   Notes **Images stored on drive**  Impression: Severe NPDR OU  - Leaking MA OU  - no NV OU - Vascular perfusion defect OU - delayed filling time        Intravitreal Injection, Pharmacologic Agent - OS - Left Eye       Time Out 03/04/2021. 3:06 PM. Confirmed correct patient, procedure, site, and patient consented.   Anesthesia Topical anesthesia was used. Anesthetic medications included Lidocaine 2%, Proparacaine 0.5%.   Procedure Preparation included 5% betadine to ocular surface. A supplied needle was used.   Injection: 1.25 mg Bevacizumab 1.25mg /0.20ml   Route: Intravitreal, Site: Left Eye   NDC: H061816, Lot: [id number] , Expiration date: 04/09/2021   Post-op Post injection exam found visual acuity of at least counting fingers. The patient tolerated the procedure well. There were no complications. The patient received written and verbal post procedure care education. Post injection medications were not given.            ASSESSMENT/PLAN:    ICD-10-CM   1. 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 Retinal hole of both eyes  H33.323     4. Essential hypertension  I10     5. Hypertensive retinopathy of both eyes  H35.033 Fluorescein Angiography Optos (Transit OD)      1. Severe Non-proliferative diabetic retinopathy, both eyes - s/p IVA OD #1, 02.03.23 for DME - exam shows scattered MA, DBH OU; +edema OU (OD > OS) - OCT shows OD - interval improvement in IRF/DME and foveal contour; OS persistent DME - FA 2.7.23 shows leaking MA OU, vascular perfusion defects OU; no NV OU - recommend IVA OS #1 today, 02.07.23 for DME - pt wishes to proceed - RBA of procedure  discussed, questions answered - informed consent obtained and signed - see procedure note - f/u 1 week, DFE, OCT, laser retinopexy OD  2,3. Lattice degeneration w/ atrophic holes, both eyes - OD: lattice with atrophic holes from 1030-1100 - OS: lattice with atrophic holes at 1100 - discussed findings, prognosis, and treatment options including observation - recommend laser retinopexy OU, OD first, but pts insurance requires prior authorization for procedure - f/u Tuesday for laser retinopexy OD  4,5. Hypertensive retinopathy OU - discussed importance of tight BP control - monitor  Ophthalmic Meds Ordered this visit:  Meds ordered this encounter  Medications   Bevacizumab (AVASTIN) SOLN 1.25 mg     Return for Return Tuesday at 9:45 for Retinopexy OD, DFE, OCT.  There are no Patient Instructions on file for this visit.  Explained the diagnoses, plan, and follow up with the patient and they expressed understanding.  Patient expressed understanding of the importance of proper follow up care.   This document serves as a record of services personally performed by Gardiner Sleeper, MD, PhD. It was created on their behalf by San Jetty. Owens Shark, OA an ophthalmic technician. The creation of this record is the provider's dictation and/or activities during the visit.    Electronically signed by: San Jetty. Owens Shark, [place] 02.03.2023 12:34 AM  Gardiner Sleeper, M.D., Ph.D. Diseases & Surgery of the Retina and Vitreous [facility]  I have reviewed the above documentation for accuracy and completeness, and I agree with the above. Gardiner Sleeper, M.D., Ph.D. 03/05/21 12:34 AM   Abbreviations: M myopia (nearsighted); A astigmatism; H hyperopia (farsighted); P presbyopia; Mrx spectacle prescription;  CTL contact lenses; OD right eye; OS left eye; OU both eyes  XT exotropia; ET esotropia; PEK punctate epithelial keratitis; PEE punctate epithelial erosions; DES dry eye syndrome; MGD  meibomian gland dysfunction; ATs artificial tears; PFAT's preservative free artificial tears; [place] nuclear sclerotic cataract; PSC posterior subcapsular cataract; ERM epi-retinal membrane; PVD posterior vitreous detachment; RD retinal detachment; DM diabetes mellitus; DR diabetic retinopathy; NPDR non-proliferative diabetic retinopathy; PDR proliferative diabetic retinopathy; CSME clinically significant macular edema; DME diabetic macular edema; dbh dot blot hemorrhages; CWS cotton wool spot; POAG primary open angle glaucoma; C/D cup-to-disc ratio; HVF humphrey visual field; GVF goldmann visual field; OCT optical coherence tomography; IOP intraocular pressure; BRVO Branch retinal vein occlusion; CRVO central retinal vein occlusion; CRAO central retinal artery occlusion; BRAO branch retinal artery occlusion; RT retinal tear; SB scleral buckle; PPV pars plana vitrectomy; VH Vitreous hemorrhage; PRP panretinal laser photocoagulation; IVK intravitreal kenalog; VMT vitreomacular traction; MH Macular hole;  NVD neovascularization of the disc; NVE neovascularization elsewhere; AREDS age related eye disease study; ARMD age related macular degeneration; POAG primary open angle glaucoma; EBMD epithelial/anterior basement membrane dystrophy; ACIOL anterior chamber intraocular lens; IOL intraocular lens; PCIOL posterior  chamber intraocular lens; Phaco/IOL phacoemulsification with intraocular lens placement; [place] photorefractive keratectomy; LASIK laser assisted in situ keratomileusis; HTN hypertension; DM diabetes mellitus; COPD chronic obstructive pulmonary disease

## 2021-03-04 NOTE — Progress Notes (Incomplete Revision)
[facility] Note  03/04/2021     CHIEF COMPLAINT Patient presents for Retina Follow Up   HISTORY OF PRESENT ILLNESS: Shaun Cummings is a 46 y.o. male who presents to the clinic today for:   HPI     Retina Follow Up           Diagnosis: Diabetic Retinopathy   Laterality: both eyes   Onset: 4 days ago         Comments   Patient here for 4 days retina follow up for NPDR OU. Patient states vision about the same. Feels good about it. May have a little pressure in eyes.      Last edited by Theodore Demark, COA on 03/04/2021  1:30 PM.    Patient believes vision is a little better since first injection.   Referring physician: Shirleen Schirmer, PA-C  Iona Beard, MD [place],  [place] [address]  HISTORICAL INFORMATION:   Selected notes from the MEDICAL RECORD NUMBER Referred by Gwenlyn Perking / [facility] for concern of diabetic retinopathy LEE:  Ocular Hx- PMH-    CURRENT MEDICATIONS: No current outpatient medications on file. (Ophthalmic Drugs)   No current facility-administered medications for this visit. (Ophthalmic Drugs)   Current Outpatient Medications (Other)  Medication Sig   empagliflozin (JARDIANCE) 10 MG TABS tablet Take 1 tablet (10 mg total) by mouth daily before breakfast.   insulin detemir (LEVEMIR) 100 unit/ml SOLN Inject 10 Units into the skin at bedtime. (Patient taking differently: Inject 15 Units into the skin at bedtime. Per Dr. Lisabeth Devoid)   Insulin Pen Needle (PEN NEEDLES) 32G X 4 MM MISC 1 Units by Does not apply route daily.   rosuvastatin (CRESTOR) 20 MG tablet Take 1 tablet (20 mg total) by mouth daily.   valsartan (DIOVAN) 80 MG tablet Take 1 tablet (80 mg total) by mouth daily.   acetaminophen (TYLENOL) 325 MG tablet Take 2 tablets (650 mg total) by mouth every 6 (six) hours as needed for mild pain (or temp > 100).   amitriptyline (ELAVIL) 25 MG tablet Take 25 mg by mouth at bedtime. (Patient not taking:  Reported on 02/28/2021)   ibuprofen (ADVIL) 600 MG tablet Take 600 mg by mouth every 6 (six) hours as needed.   losartan-hydrochlorothiazide (HYZAAR) 50-12.5 MG tablet Take 1 tablet by mouth daily. (Patient not taking: Reported on 02/28/2021)   No current facility-administered medications for this visit. (Other)   REVIEW OF SYSTEMS: ROS   Positive for: Endocrine, Eyes Last edited by Theodore Demark, COA on 03/04/2021  1:30 PM.     ALLERGIES Allergies  Allergen Reactions   Augmentin [Amoxicillin-Pot Clavulanate] Hives and Itching    Has patient had a PCN reaction causing immediate rash, facial/tongue/throat swelling, SOB or lightheadedness with hypotension: Yes Has patient had a PCN reaction causing severe rash involving mucus membranes or skin necrosis: No Has patient had a PCN reaction that required hospitalization: No Has patient had a PCN reaction occurring within the last 10 years: No If all of the above answers are "NO", then may proceed with Cephalosporin use.   Contrast Media [Iodinated Contrast Media] Hives    Pt broke out with 1 hive on forehead after CT injection-treated with 25mg  Benadryl   PAST MEDICAL HISTORY Past Medical History:  Diagnosis Date   Chest pain    Hyperlipidemia    Lesion of penis    foreskin   OSA (obstructive sleep apnea)  per pt dx osa and used cpap but after losing wt. from 415 pounds down to 244 pounds no longer needs cpap   Peripheral neuropathy    Phimosis    Scrotal abscess 05/08/2017   Type 2 diabetes mellitus ([place])    per pt no meds for two years pt was changed to levemir unable to afford but pt states is waiting on approval for a program he applied for that will pay for his meds ([place])     Past Surgical History:  Procedure Laterality Date   CIRCUMCISION N/A 02/10/2016   Procedure: CIRCUMCISION ADULT;  Surgeon: Cleon Gustin, MD;  Location: [facility];  Service: Urology;  Laterality: N/A;   INCISION AND  DRAINAGE ABSCESS Right 05/12/2017   Procedure: INCISION AND DRAINAGE RIGHT INGUINAL ABSCESS;  Surgeon: Erroll Luna, MD;  Location: [place] [facility];  Service: General;  Laterality: Right;   INCISION AND DRAINAGE ABSCESS N/A 03/24/2018   Procedure: INCISION AND DRAINAGE POSTERIOR NECK ABSCESS;  Surgeon: Excell Seltzer, MD;  Location: [facility];  Service: General;  Laterality: N/A;   INCISION AND DRAINAGE ABSCESS Right 06/19/2020   Procedure: INCISION AND DRAINAGE ABSCESS, RIGHT GROIN;  Surgeon: Johnathan Hausen, MD;  Location: [facility];  Service: General;  Laterality: Right;   INCISION AND DRAINAGE PERIRECTAL ABSCESS Right 05/09/2017   Procedure: IRRIGATION AND DEBRIDEMENT PERINEAL ABSCESS;  Surgeon: Donnie Mesa, MD;  Location: [place];  Service: General;  Laterality: Right;   NO PAST SURGERIES     FAMILY HISTORY Family History  Problem Relation Age of Onset   Diabetes Mother    Angina Mother    CAD Mother    Glaucoma Father    Diabetes Father    Heart attack Father    Kidney failure Father    Diabetes Sister    Diabetes Brother    Glaucoma Maternal Grandmother    Diabetes Maternal Grandmother    Glaucoma Maternal Grandfather    Diabetes Maternal Grandfather    Glaucoma Paternal Grandmother    Diabetes Paternal Grandmother    Glaucoma Paternal Grandfather    Diabetes Paternal Grandfather    SOCIAL HISTORY Social History   Tobacco Use   Smoking status: Former    Years: 2.00    Types: Cigarettes    Quit date: 02/05/2008    Years since quitting: 13.0   Smokeless tobacco: Never  Vaping Use   Vaping Use: Never used  Substance Use Topics   Alcohol use: No   Drug use: No       OPHTHALMIC EXAM:  Base Eye Exam     Visual Acuity (Snellen - Linear)       Right Left   Dist cc 20/150 20/40   Dist ph cc 20/100 -2 NI    Correction: Glasses         Tonometry (Tonopen, 1:28 PM)       Right Left   Pressure 13 13         Pupils       Dark Light Shape React APD   Right 4 3  Round Brisk None   Left 4 3 Round Brisk None         Visual Fields (Counting fingers)       Left Right    Full Full         Extraocular Movement       Right Left    Full, Ortho Full, Ortho         Neuro/Psych  Oriented x3: Yes   Mood/Affect: Normal         Dilation     Both eyes: 1.0% Mydriacyl, 2.5% Phenylephrine @ 1:27 PM           Slit Lamp and Fundus Exam     Slit Lamp Exam       Right Left   Lids/Lashes Dermatochalasis - upper lid Dermatochalasis - upper lid   Conjunctiva/Sclera White and quiet White and quiet   Cornea Clear Clear   Anterior Chamber Deep and quiet Deep and quiet   Iris Round and dilated, No NVI Round and dilated, No NVI   Lens Clear Clear   Anterior Vitreous Vitreous syneresis Vitreous syneresis         Fundus Exam       Right Left   Disc Pink and Sharp, no NVD Pink and Sharp, no NVD   C/D Ratio 0.3 0.3   Macula Blunted foveal reflex, scattered MA/DBH, scattered exudates, +edema greatest centrally, +CWS Blunted foveal reflex, scattered MA/DBH, scattered exudates, +CWS   Vessels attenuated, Tortuous, AV crossing changes, ?early NVE attenuated, Tortuous, AV crossing changes   Periphery Attached, lattice degeneration with atrophic holes from 1030-1100 Attached, lattice degeneration with atrophic holes from 1030-1100           Refraction     Wearing Rx       Sphere Cylinder Axis   Right -2.25 Sphere    Left -4.75 +0.75 170            IMAGING AND PROCEDURES  Imaging and Procedures for 03/04/2021  OCT, Retina - OU - Both Eyes       Right Eye Quality was good. Central Foveal Thickness: 421. Progression has improved. Findings include abnormal foveal contour, intraretinal fluid, intraretinal hyper-reflective material, no SRF, vitreomacular adhesion (Interval improvement in IRF/IRHM/foveal contour).   Left Eye Quality was good. Central Foveal Thickness: 317. Progression has been stable. Findings include abnormal  foveal contour, intraretinal hyper-reflective material, intraretinal fluid (Scattered cystic changes greatest centrally -- stable).   Notes *Images captured and stored on drive  Diagnosis / Impression:  +DME OU (OD > OS)  Clinical management:  See below  Abbreviations: NFP - Normal foveal profile. CME - cystoid macular edema. PED - pigment epithelial detachment. IRF - intraretinal fluid. SRF - subretinal fluid. EZ - ellipsoid zone. ERM - epiretinal membrane. ORA - outer retinal atrophy. ORT - outer retinal tubulation. SRHM - subretinal hyper-reflective material. IRHM - intraretinal hyper-reflective material      Fluorescein Angiography Optos (Transit OD)       Right Eye Progression has no prior data. Early phase findings include delayed filling, vascular perfusion defect, microaneurysm. Mid/Late phase findings include microaneurysm, vascular perfusion defect, leakage (No NV).   Left Eye Progression has no prior data. Early phase findings include microaneurysm, vascular perfusion defect. Mid/Late phase findings include microaneurysm, leakage, vascular perfusion defect.   Notes **Images stored on drive**  Impression: Severe NPDR OU  Leaking MA OU, no NV OU, Vascular perfusion defect OU              ASSESSMENT/PLAN:    ICD-10-CM   1. Severe nonproliferative diabetic retinopathy of both eyes with macular edema associated with type 2 diabetes mellitus (HCC)  E11.3413 OCT, Retina - OU - Both Eyes    2. 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 Retinal hole of both eyes  H33.323     4. Essential hypertension  I10  5. Hypertensive retinopathy of both eyes  H35.033 Fluorescein Angiography Optos (Transit OD)      1. Severe Non-proliferative diabetic retinopathy, both eyes - The incidence, risk factors for progression, natural history and treatment options for diabetic retinopathy were discussed with patient.   - The need for close monitoring of blood  glucose, blood pressure, and serum lipids, avoiding cigarette or any type of tobacco, and the need for long term follow up was also discussed with patient. - FA 02.07.23 shows: Leaking MA, no NV, Vascular perfusion defect OU - s/p OD IVA #1 02.03.23 - VA OD 20/100 (improved from 20/150) - exam shows scattered MA, DBH OU; +edema OU (OD > OS) - OCT shows diabetic macular edema, both eyes  - recommend IVA #1 OS today, 02.07.23 for DME - pt wishes to proceed - RBA of procedure discussed, questions answered - informed consent obtained and signed OD 02.03.23 OS 02.07.23 - see procedure note - f/u 1 week, DFE, OCT, laser OD Tue 9:45  2,3. Lattice degeneration w/ atrophic holes, both eyes - OD: lattice with atrophic holes from 1030-1100 - OS: lattice with atrophic holes at 1100 - discussed findings, prognosis, and treatment options including observation - recommend laser retinopexy OU, OD first, insurance has been approved for treatment 02.07.23 - f/u Tuesday for Retinopexy OD at 9:45  4,5. Hypertensive retinopathy OU - discussed importance of tight BP control - monitor  Ophthalmic Meds Ordered this visit:  No orders of the defined types were placed in this encounter.    Return for Return Tuesday at 9:45 for Retinopexy OD, DFE, OCT.  There are no Patient Instructions on file for this visit.  Explained the diagnoses, plan, and follow up with the patient and they expressed understanding.  Patient expressed understanding of the importance of proper follow up care.   This document serves as a record of services personally performed by Gardiner Sleeper, MD, PhD. It was created on their behalf by Leonie Douglas, an ophthalmic technician. The creation of this record is the provider's dictation and/or activities during the visit.    Electronically signed by: Leonie Douglas COA, 03/04/21  3:07 PM   This document serves as a record of services personally performed by Gardiner Sleeper, MD, PhD. It was  created on their behalf by San Jetty. Owens Shark, OA an ophthalmic technician. The creation of this record is the provider's dictation and/or activities during the visit.    Electronically signed by: San Jetty. Owens Shark, [place] 02.03.2023 3:05 PM  Gardiner Sleeper, M.D., Ph.D. Diseases & Surgery of the Retina and Vitreous [facility]  I have reviewed the above documentation for accuracy and completeness, and I agree with the above. Gardiner Sleeper, M.D., Ph.D. 02/28/21 3:05 PM   Abbreviations: M myopia (nearsighted); A astigmatism; H hyperopia (farsighted); P presbyopia; Mrx spectacle prescription;  CTL contact lenses; OD right eye; OS left eye; OU both eyes  XT exotropia; ET esotropia; PEK punctate epithelial keratitis; PEE punctate epithelial erosions; DES dry eye syndrome; MGD meibomian gland dysfunction; ATs artificial tears; PFAT's preservative free artificial tears; [place] nuclear sclerotic cataract; PSC posterior subcapsular cataract; ERM epi-retinal membrane; PVD posterior vitreous detachment; RD retinal detachment; DM diabetes mellitus; DR diabetic retinopathy; NPDR non-proliferative diabetic retinopathy; PDR proliferative diabetic retinopathy; CSME clinically significant macular edema; DME diabetic macular edema; dbh dot blot hemorrhages; CWS cotton wool spot; POAG primary open angle glaucoma; C/D cup-to-disc ratio; HVF humphrey visual field; GVF goldmann visual field; OCT optical coherence  tomography; IOP intraocular pressure; BRVO Branch retinal vein occlusion; CRVO central retinal vein occlusion; CRAO central retinal artery occlusion; BRAO branch retinal artery occlusion; RT retinal tear; SB scleral buckle; PPV pars plana vitrectomy; VH Vitreous hemorrhage; PRP panretinal laser photocoagulation; IVK intravitreal kenalog; VMT vitreomacular traction; MH Macular hole;  NVD neovascularization of the disc; NVE neovascularization elsewhere; AREDS age related eye disease study; ARMD age  related macular degeneration; POAG primary open angle glaucoma; EBMD epithelial/anterior basement membrane dystrophy; ACIOL anterior chamber intraocular lens; IOL intraocular lens; PCIOL posterior chamber intraocular lens; Phaco/IOL phacoemulsification with intraocular lens placement; [place] photorefractive keratectomy; LASIK laser assisted in situ keratomileusis; HTN hypertension; DM diabetes mellitus; COPD chronic obstructive pulmonary disease  [facility] Note  03/04/2021     CHIEF COMPLAINT Patient presents for Retina Follow Up   HISTORY OF PRESENT ILLNESS: Dugan Vanhoesen is a 46 y.o. male who presents to the clinic today for:   HPI     Retina Follow Up           Diagnosis: Diabetic Retinopathy   Laterality: both eyes   Onset: 4 days ago         Comments   Patient here for 4 days retina follow up for NPDR OU. Patient states vision about the same. Feels good about it. May have a little pressure in eyes.      Last edited by Theodore Demark, COA on 03/04/2021  1:30 PM.    Pt is here on the referral of Gwenlyn Perking / [facility] for concern of diabetic retinopathy, pt states he just started seeing on interval medicine dr who sent him to see Dr. Katy Fitch, pt states he has been diabetic for almost 20 years, pt states there was a period of time where he did not have insurance and was not taking his medication as directed, he states he got insurance in the middle of 2022 and started taking insulin again, his last A1c was 13.4 on 01.09.23 and blood glucose was 167 this morning, pt states he has noticed a decline in vision over the past 6 months, pts dad he had to have surgery due to diabetes retinopathy and he eventually went blind  Referring physician: Shirleen Schirmer, Bunnie Philips, MD [place],  [place] [address]  HISTORICAL INFORMATION:   Selected notes from the [place] Referred by Gwenlyn Perking / [facility] for concern of diabetic  retinopathy LEE:  Ocular Hx- PMH-    CURRENT MEDICATIONS: No current outpatient medications on file. (Ophthalmic Drugs)   No current facility-administered medications for this visit. (Ophthalmic Drugs)   Current Outpatient Medications (Other)  Medication Sig   empagliflozin (JARDIANCE) 10 MG TABS tablet Take 1 tablet (10 mg total) by mouth daily before breakfast.   insulin detemir (LEVEMIR) 100 unit/ml SOLN Inject 10 Units into the skin at bedtime. (Patient taking differently: Inject 15 Units into the skin at bedtime. Per Dr. Lisabeth Devoid)   Insulin Pen Needle (PEN NEEDLES) 32G X 4 MM MISC 1 Units by Does not apply route daily.   rosuvastatin (CRESTOR) 20 MG tablet Take 1 tablet (20 mg total) by mouth daily.   valsartan (DIOVAN) 80 MG tablet Take 1 tablet (80 mg total) by mouth daily.   acetaminophen (TYLENOL) 325 MG tablet Take 2 tablets (650 mg total) by mouth every 6 (six) hours as needed for mild pain (or temp > 100).   amitriptyline (ELAVIL) 25 MG tablet Take 25  mg by mouth at bedtime. (Patient not taking: Reported on 02/28/2021)   ibuprofen (ADVIL) 600 MG tablet Take 600 mg by mouth every 6 (six) hours as needed.   losartan-hydrochlorothiazide (HYZAAR) 50-12.5 MG tablet Take 1 tablet by mouth daily. (Patient not taking: Reported on 02/28/2021)   No current facility-administered medications for this visit. (Other)   REVIEW OF SYSTEMS: ROS   Positive for: Endocrine, Eyes Last edited by Theodore Demark, COA on 03/04/2021  1:30 PM.     ALLERGIES Allergies  Allergen Reactions   Augmentin [Amoxicillin-Pot Clavulanate] Hives and Itching    Has patient had a PCN reaction causing immediate rash, facial/tongue/throat swelling, SOB or lightheadedness with hypotension: Yes Has patient had a PCN reaction causing severe rash involving mucus membranes or skin necrosis: No Has patient had a PCN reaction that required hospitalization: No Has patient had a PCN reaction occurring within the last 10  years: No If all of the above answers are "NO", then may proceed with Cephalosporin use.   Contrast Media [Iodinated Contrast Media] Hives    Pt broke out with 1 hive on forehead after CT injection-treated with 25mg  Benadryl   PAST MEDICAL HISTORY Past Medical History:  Diagnosis Date   Chest pain    Hyperlipidemia    Lesion of penis    foreskin   OSA (obstructive sleep apnea)    per pt dx osa and used cpap but after losing wt. from 415 pounds down to 244 pounds no longer needs cpap   Peripheral neuropathy    Phimosis    Scrotal abscess 05/08/2017   Type 2 diabetes mellitus ([place])    per pt no meds for two years pt was changed to levemir unable to afford but pt states is waiting on approval for a program he applied for that will pay for his meds ([place])     Past Surgical History:  Procedure Laterality Date   CIRCUMCISION N/A 02/10/2016   Procedure: CIRCUMCISION ADULT;  Surgeon: Cleon Gustin, MD;  Location: [facility];  Service: Urology;  Laterality: N/A;   INCISION AND DRAINAGE ABSCESS Right 05/12/2017   Procedure: INCISION AND DRAINAGE RIGHT INGUINAL ABSCESS;  Surgeon: Erroll Luna, MD;  Location: [facility];  Service: General;  Laterality: Right;   INCISION AND DRAINAGE ABSCESS N/A 03/24/2018   Procedure: INCISION AND DRAINAGE POSTERIOR NECK ABSCESS;  Surgeon: Excell Seltzer, MD;  Location: [facility];  Service: General;  Laterality: N/A;   INCISION AND DRAINAGE ABSCESS Right 06/19/2020   Procedure: INCISION AND DRAINAGE ABSCESS, RIGHT GROIN;  Surgeon: Johnathan Hausen, MD;  [facility];  Service: General;  Laterality: Right;   INCISION AND DRAINAGE PERIRECTAL ABSCESS Right 05/09/2017   Procedure: IRRIGATION AND DEBRIDEMENT PERINEAL ABSCESS;  Surgeon: Donnie Mesa, MD;  Location: [facility];  Service: General;  Laterality: Right;   NO PAST SURGERIES     FAMILY HISTORY Family History  Problem Relation Age of Onset   Diabetes Mother    Angina Mother     CAD Mother    Glaucoma Father    Diabetes Father    Heart attack Father    Kidney failure Father    Diabetes Sister    Diabetes Brother    Glaucoma Maternal Grandmother    Diabetes Maternal Grandmother    Glaucoma Maternal Grandfather    Diabetes Maternal Grandfather    Glaucoma Paternal Grandmother    Diabetes Paternal Grandmother    Glaucoma Paternal Grandfather    Diabetes Paternal Grandfather  SOCIAL HISTORY Social History   Tobacco Use   Smoking status: Former    Years: 2.00    Types: Cigarettes    Quit date: 02/05/2008    Years since quitting: 13.0   Smokeless tobacco: Never  Vaping Use   Vaping Use: Never used  Substance Use Topics   Alcohol use: No   Drug use: No       OPHTHALMIC EXAM:  Base Eye Exam     Visual Acuity (Snellen - Linear)       Right Left   Dist cc 20/150 20/40   Dist ph cc 20/100 -2 NI    Correction: Glasses         Tonometry (Tonopen, 1:28 PM)       Right Left   Pressure 13 13         Pupils       Dark Light Shape React APD   Right 4 3 Round Brisk None   Left 4 3 Round Brisk None         Visual Fields (Counting fingers)       Left Right    Full Full         Extraocular Movement       Right Left    Full, Ortho Full, Ortho         Neuro/Psych     Oriented x3: Yes   Mood/Affect: Normal         Dilation     Both eyes: 1.0% Mydriacyl, 2.5% Phenylephrine @ 1:27 PM           Slit Lamp and Fundus Exam     Slit Lamp Exam       Right Left   Lids/Lashes Dermatochalasis - upper lid Dermatochalasis - upper lid   Conjunctiva/Sclera White and quiet White and quiet   Cornea Clear Clear   Anterior Chamber Deep and quiet Deep and quiet   Iris Round and dilated, No NVI Round and dilated, No NVI   Lens Clear Clear   Anterior Vitreous Vitreous syneresis Vitreous syneresis         Fundus Exam       Right Left   Disc Pink and Sharp, no NVD Pink and Sharp, no NVD   C/D Ratio 0.3 0.3   Macula  Blunted foveal reflex, scattered MA/DBH, scattered exudates, +edema greatest centrally, +CWS Blunted foveal reflex, scattered MA/DBH, scattered exudates, +CWS   Vessels attenuated, Tortuous, AV crossing changes, ?early NVE attenuated, Tortuous, AV crossing changes   Periphery Attached, lattice degeneration with atrophic holes from 1030-1100 Attached, lattice degeneration with atrophic holes from 1030-1100           Refraction     Wearing Rx       Sphere Cylinder Axis   Right -2.25 Sphere    Left -4.75 +0.75 170            IMAGING AND PROCEDURES  Imaging and Procedures for 03/04/2021  OCT, Retina - OU - Both Eyes       Right Eye Quality was good. Central Foveal Thickness: 421. Progression has improved. Findings include abnormal foveal contour, intraretinal fluid, intraretinal hyper-reflective material, no SRF, vitreomacular adhesion (Interval improvement in IRF/IRHM/foveal contour).   Left Eye Quality was good. Central Foveal Thickness: 317. Progression has been stable. Findings include abnormal foveal contour, intraretinal hyper-reflective material, intraretinal fluid (Scattered cystic changes greatest centrally -- stable).   Notes *Images captured and stored on drive  Diagnosis / Impression:  +DME  OU (OD > OS)  Clinical management:  See below  Abbreviations: NFP - Normal foveal profile. CME - cystoid macular edema. PED - pigment epithelial detachment. IRF - intraretinal fluid. SRF - subretinal fluid. EZ - ellipsoid zone. ERM - epiretinal membrane. ORA - outer retinal atrophy. ORT - outer retinal tubulation. SRHM - subretinal hyper-reflective material. IRHM - intraretinal hyper-reflective material      Fluorescein Angiography Optos (Transit OD)       Right Eye Progression has no prior data. Early phase findings include delayed filling, vascular perfusion defect, microaneurysm. Mid/Late phase findings include microaneurysm, vascular perfusion defect, leakage (No NV).    Left Eye Progression has no prior data. Early phase findings include microaneurysm, vascular perfusion defect. Mid/Late phase findings include microaneurysm, leakage, vascular perfusion defect.   Notes **Images stored on drive**  Impression: Severe NPDR OU  Leaking MA OU, no NV OU, Vascular perfusion defect OU              ASSESSMENT/PLAN:    ICD-10-CM   1. Severe nonproliferative diabetic retinopathy of both eyes with macular edema associated with type 2 diabetes mellitus (HCC)  E11.3413 OCT, Retina - OU - Both Eyes    2. 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 Retinal hole of both eyes  H33.323     4. Essential hypertension  I10     5. Hypertensive retinopathy of both eyes  H35.033 Fluorescein Angiography Optos (Transit OD)      1. Severe Non-proliferative diabetic retinopathy, both eyes - The incidence, risk factors for progression, natural history and treatment options for diabetic retinopathy were discussed with patient.   - The need for close monitoring of blood glucose, blood pressure, and serum lipids, avoiding cigarette or any type of tobacco, and the need for long term follow up was also discussed with patient. - exam shows scattered MA, DBH OU; +edema OU (OD > OS) - OCT shows diabetic macular edema, both eyes  - The natural history, pathology, and characteristics of diabetic macular edema discussed with patient.  A generalized discussion of the major clinical trials concerning treatment of diabetic macular edema (ETDRS, DCT, SCORE, RISE / RIDE, and ongoing DRCR net studies) was completed.  This discussion included mention of the various approaches to treating diabetic macular edema (observation, laser photocoagulation, anti-VEGF injections with lucentis / Avastin / Eylea, steroid injections with Kenalog / Ozurdex, and intraocular surgery with vitrectomy).  The goal hemoglobin A1C of 6-7 was discussed, as well as importance of smoking cessation and  hypertension control.  Need for ongoing treatment and monitoring were specifically discussed with reference to chronic nature of diabetic macular edema. - recommend IVA #1 OD today, 02.03.23 for DME - pt wishes to proceed - RBA of procedure discussed, questions answered - informed consent obtained and signed - see procedure note - f/u 1 week, DFE, OCT, Optos FA (transit OD)  2,3. Lattice degeneration w/ atrophic holes, both eyes - OD: lattice with atrophic holes from 1030-1100 - OS: lattice with atrophic holes at 1100 - discussed findings, prognosis, and treatment options including observation - recommend laser retinopexy OU, OD first, but pts insurance requires prior authorization for procedure  4,5. Hypertensive retinopathy OU - discussed importance of tight BP control - monitor  Ophthalmic Meds Ordered this visit:  No orders of the defined types were placed in this encounter.    Return for Return Tuesday at 9:45 for Retinopexy OD, DFE, OCT.  There are no Patient Instructions on  file for this visit.  Explained the diagnoses, plan, and follow up with the patient and they expressed understanding.  Patient expressed understanding of the importance of proper follow up care.   This document serves as a record of services personally performed by Gardiner Sleeper, MD, PhD. It was created on their behalf by San Jetty. Owens Shark, OA an ophthalmic technician. The creation of this record is the provider's dictation and/or activities during the visit.    Electronically signed by: San Jetty. Owens Shark, [place] 02.03.2023 3:05 PM  Gardiner Sleeper, M.D., Ph.D. Diseases & Surgery of the Retina and Vitreous [facility]  I have reviewed the above documentation for accuracy and completeness, and I agree with the above. Gardiner Sleeper, M.D., Ph.D. 02/28/21 3:05 PM   Abbreviations: M myopia (nearsighted); A astigmatism; H hyperopia (farsighted); P presbyopia; Mrx spectacle prescription;   CTL contact lenses; OD right eye; OS left eye; OU both eyes  XT exotropia; ET esotropia; PEK punctate epithelial keratitis; PEE punctate epithelial erosions; DES dry eye syndrome; MGD meibomian gland dysfunction; ATs artificial tears; PFAT's preservative free artificial tears; [place] nuclear sclerotic cataract; PSC posterior subcapsular cataract; ERM epi-retinal membrane; PVD posterior vitreous detachment; RD retinal detachment; DM diabetes mellitus; DR diabetic retinopathy; NPDR non-proliferative diabetic retinopathy; PDR proliferative diabetic retinopathy; CSME clinically significant macular edema; DME diabetic macular edema; dbh dot blot hemorrhages; CWS cotton wool spot; POAG primary open angle glaucoma; C/D cup-to-disc ratio; HVF humphrey visual field; GVF goldmann visual field; OCT optical coherence tomography; IOP intraocular pressure; BRVO Branch retinal vein occlusion; CRVO central retinal vein occlusion; CRAO central retinal artery occlusion; BRAO branch retinal artery occlusion; RT retinal tear; SB scleral buckle; PPV pars plana vitrectomy; VH Vitreous hemorrhage; PRP panretinal laser photocoagulation; IVK intravitreal kenalog; VMT vitreomacular traction; MH Macular hole;  NVD neovascularization of the disc; NVE neovascularization elsewhere; AREDS age related eye disease study; ARMD age related macular degeneration; POAG primary open angle glaucoma; EBMD epithelial/anterior basement membrane dystrophy; ACIOL anterior chamber intraocular lens; IOL intraocular lens; PCIOL posterior chamber intraocular lens; Phaco/IOL phacoemulsification with intraocular lens placement; [place] photorefractive keratectomy; LASIK laser assisted in situ keratomileusis; HTN hypertension; DM diabetes mellitus; COPD chronic obstructive pulmonary disease

## 2021-03-05 ENCOUNTER — Encounter (INDEPENDENT_AMBULATORY_CARE_PROVIDER_SITE_OTHER): Payer: Self-pay | Admitting: Ophthalmology

## 2021-03-05 MED ORDER — BEVACIZUMAB CHEMO INJECTION 1.25MG/0.05ML SYRINGE FOR KALEIDOSCOPE
1.2500 mg | INTRAVITREAL | Status: AC | PRN
  Administered 2021-03-05: 1.25 mg via INTRAVITREAL

## 2021-03-05 NOTE — Telephone Encounter (Signed)
Patient would like to have a prescription for the [place] 3.  Says it is working well for him.   Got a sample before.  Needs prescription for sent to [facility] on [address] and Levemir to  be sent to the [facility] on [address] as well.

## 2021-03-05 NOTE — Progress Notes (Signed)
[facility] Note  03/04/2021     CHIEF COMPLAINT Patient presents for Retina Follow Up   HISTORY OF PRESENT ILLNESS: Shaun Cummings is a 46 y.o. male who presents to the clinic today for:   HPI     Retina Follow Up   Patient presents with  Diabetic Retinopathy.  In both eyes.  This started 4 days ago.  I, the attending physician,  performed the HPI with the patient and updated documentation appropriately.        Comments   Patient here for 4 days retina follow up for NPDR OU. Patient states vision about the same. Feels good about it. May have a little pressure in eyes.      Last edited by Bernarda Caffey, MD on 03/05/2021 12:34 AM.    Patient believes vision is a little better since first injection.   Referring physician: Shirleen Schirmer, PA-C  Iona Beard, MD [place],  [place] [address]  HISTORICAL INFORMATION:   Selected notes from the MEDICAL RECORD NUMBER Referred by Gwenlyn Perking / [facility] for concern of diabetic retinopathy LEE:  Ocular Hx- PMH-    CURRENT MEDICATIONS: No current outpatient medications on file. (Ophthalmic Drugs)   No current facility-administered medications for this visit. (Ophthalmic Drugs)   Current Outpatient Medications (Other)  Medication Sig   empagliflozin (JARDIANCE) 10 MG TABS tablet Take 1 tablet (10 mg total) by mouth daily before breakfast.   insulin detemir (LEVEMIR) 100 unit/ml SOLN Inject 10 Units into the skin at bedtime. (Patient taking differently: Inject 15 Units into the skin at bedtime. Per Dr. Lisabeth Devoid)   Insulin Pen Needle (PEN NEEDLES) 32G X 4 MM MISC 1 Units by Does not apply route daily.   rosuvastatin (CRESTOR) 20 MG tablet Take 1 tablet (20 mg total) by mouth daily.   valsartan (DIOVAN) 80 MG tablet Take 1 tablet (80 mg total) by mouth daily.   acetaminophen (TYLENOL) 325 MG tablet Take 2 tablets (650 mg total) by mouth every 6 (six) hours as needed for mild pain (or temp >  100).   amitriptyline (ELAVIL) 25 MG tablet Take 25 mg by mouth at bedtime. (Patient not taking: Reported on 02/28/2021)   ibuprofen (ADVIL) 600 MG tablet Take 600 mg by mouth every 6 (six) hours as needed.   losartan-hydrochlorothiazide (HYZAAR) 50-12.5 MG tablet Take 1 tablet by mouth daily. (Patient not taking: Reported on 02/28/2021)   No current facility-administered medications for this visit. (Other)   REVIEW OF SYSTEMS: ROS   Positive for: Endocrine, Eyes Negative for: Constitutional, Gastrointestinal, Neurological, Skin, Genitourinary, Musculoskeletal, HENT, Cardiovascular, Respiratory, Psychiatric, Allergic/Imm, Heme/Lymph Last edited by Leonie Douglas, COA on 03/04/2021  3:15 PM.     ALLERGIES Allergies  Allergen Reactions   Augmentin [Amoxicillin-Pot Clavulanate] Hives and Itching    Has patient had a PCN reaction causing immediate rash, facial/tongue/throat swelling, SOB or lightheadedness with hypotension: Yes Has patient had a PCN reaction causing severe rash involving mucus membranes or skin necrosis: No Has patient had a PCN reaction that required hospitalization: No Has patient had a PCN reaction occurring within the last 10 years: No If all of the above answers are "NO", then may proceed with Cephalosporin use.   Contrast Media [Iodinated Contrast Media] Hives    Pt broke out with 1 hive on forehead after CT injection-treated with 25mg  Benadryl   PAST MEDICAL HISTORY Past Medical History:  Diagnosis Date   Chest pain  Hyperlipidemia    Lesion of penis    foreskin   OSA (obstructive sleep apnea)    per pt dx osa and used cpap but after losing wt. from 415 pounds down to 244 pounds no longer needs cpap   Peripheral neuropathy    Phimosis    Scrotal abscess 05/08/2017   Type 2 diabetes mellitus ([place])    per pt no meds for two years pt was changed to levemir unable to afford but pt states is waiting on approval for a program he applied for that will pay for his meds  ([place])     Past Surgical History:  Procedure Laterality Date   CIRCUMCISION N/A 02/10/2016   Procedure: CIRCUMCISION ADULT;  Surgeon: Cleon Gustin, MD;  Location: [facility];  Service: Urology;  Laterality: N/A;   INCISION AND DRAINAGE ABSCESS Right 05/12/2017   Procedure: INCISION AND DRAINAGE RIGHT INGUINAL ABSCESS;  Surgeon: Erroll Luna, MD;  Location: [facility];  Service: General;  Laterality: Right;   INCISION AND DRAINAGE ABSCESS N/A 03/24/2018   Procedure: INCISION AND DRAINAGE POSTERIOR NECK ABSCESS;  Surgeon: Excell Seltzer, MD;  Location: [facility];  Service: General;  Laterality: N/A;   INCISION AND DRAINAGE ABSCESS Right 06/19/2020   Procedure: INCISION AND DRAINAGE ABSCESS, RIGHT GROIN;  Surgeon: Johnathan Hausen, MD;  Location: [facility];  Service: General;  Laterality: Right;   INCISION AND DRAINAGE PERIRECTAL ABSCESS Right 05/09/2017   Procedure: IRRIGATION AND DEBRIDEMENT PERINEAL ABSCESS;  Surgeon: Donnie Mesa, MD;  Location: [facility];  Service: General;  Laterality: Right;   NO PAST SURGERIES     FAMILY HISTORY Family History  Problem Relation Age of Onset   Diabetes Mother    Angina Mother    CAD Mother    Glaucoma Father    Diabetes Father    Heart attack Father    Kidney failure Father    Diabetes Sister    Diabetes Brother    Glaucoma Maternal Grandmother    Diabetes Maternal Grandmother    Glaucoma Maternal Grandfather    Diabetes Maternal Grandfather    Glaucoma Paternal Grandmother    Diabetes Paternal Grandmother    Glaucoma Paternal Grandfather    Diabetes Paternal Grandfather    SOCIAL HISTORY Social History   Tobacco Use   Smoking status: Former    Years: 2.00    Types: Cigarettes    Quit date: 02/05/2008    Years since quitting: 13.0   Smokeless tobacco: Never  Vaping Use   Vaping Use: Never used  Substance Use Topics   Alcohol use: No   Drug use: No       OPHTHALMIC EXAM:  Base Eye Exam     Visual  Acuity (Snellen - Linear)       Right Left   Dist cc 20/150 20/40   Dist ph cc 20/100 -2 NI    Correction: Glasses         Tonometry (Tonopen, 1:28 PM)       Right Left   Pressure 13 13         Pupils       Dark Light Shape React APD   Right 4 3 Round Brisk None   Left 4 3 Round Brisk None         Visual Fields (Counting fingers)       Left Right    Full Full         Extraocular Movement  Right Left    Full, Ortho Full, Ortho         Neuro/Psych     Oriented x3: Yes   Mood/Affect: Normal         Dilation     Both eyes: 1.0% Mydriacyl, 2.5% Phenylephrine @ 1:27 PM           Slit Lamp and Fundus Exam     Slit Lamp Exam       Right Left   Lids/Lashes Dermatochalasis - upper lid Dermatochalasis - upper lid   Conjunctiva/Sclera White and quiet White and quiet   Cornea Clear Clear   Anterior Chamber Deep and quiet Deep and quiet   Iris Round and dilated, No NVI Round and dilated, No NVI   Lens Clear Clear   Anterior Vitreous Vitreous syneresis Vitreous syneresis         Fundus Exam       Right Left   Disc Pink and Sharp, no NVD Pink and Sharp, no NVD   C/D Ratio 0.3 0.3   Macula Blunted foveal reflex, scattered MA/DBH, scattered exudates, +edema greatest centrally, +CWS Blunted foveal reflex, scattered MA/DBH, scattered exudates, +CWS   Vessels attenuated, Tortuous, AV crossing changes, ?early NVE attenuated, Tortuous, AV crossing changes   Periphery Attached, lattice degeneration with atrophic holes from 1030-1100 Attached, lattice degeneration with atrophic holes from 1030-1100           Refraction     Wearing Rx       Sphere Cylinder Axis   Right -2.25 Sphere    Left -4.75 +0.75 170            IMAGING AND PROCEDURES  Imaging and Procedures for 03/04/2021  OCT, Retina - OU - Both Eyes       Right Eye Quality was good. Central Foveal Thickness: 421. Progression has improved. Findings include abnormal foveal  contour, intraretinal fluid, intraretinal hyper-reflective material, no SRF, vitreomacular adhesion (Interval improvement in IRF/IRHM/foveal contour).   Left Eye Quality was good. Central Foveal Thickness: 317. Progression has been stable. Findings include abnormal foveal contour, intraretinal hyper-reflective material, intraretinal fluid (Scattered cystic changes greatest centrally -- stable).   Notes *Images captured and stored on drive  Diagnosis / Impression:  +DME OU (OD > OS) OD: Interval improvement in IRF/IRHM/foveal contour OS: Scattered cystic changes greatest centrally -- stable  Clinical management:  See below  Abbreviations: NFP - Normal foveal profile. CME - cystoid macular edema. PED - pigment epithelial detachment. IRF - intraretinal fluid. SRF - subretinal fluid. EZ - ellipsoid zone. ERM - epiretinal membrane. ORA - outer retinal atrophy. ORT - outer retinal tubulation. SRHM - subretinal hyper-reflective material. IRHM - intraretinal hyper-reflective material      Fluorescein Angiography Optos (Transit OD)       Right Eye Progression has no prior data. Early phase findings include delayed filling, vascular perfusion defect, microaneurysm. Mid/Late phase findings include microaneurysm, vascular perfusion defect, leakage (No NV).   Left Eye Progression has no prior data. Early phase findings include microaneurysm, vascular perfusion defect. Mid/Late phase findings include microaneurysm, leakage, vascular perfusion defect.   Notes **Images stored on drive**  Impression: Severe NPDR OU  - Leaking MA OU  - no NV OU - Vascular perfusion defect OU - delayed filling time        Intravitreal Injection, Pharmacologic Agent - OS - Left Eye       Time Out 03/04/2021. 3:06 PM. Confirmed correct patient, procedure, site, and patient consented.  Anesthesia Topical anesthesia was used. Anesthetic medications included Lidocaine 2%, Proparacaine 0.5%.    Procedure Preparation included 5% betadine to ocular surface. A supplied needle was used.   Injection: 1.25 mg Bevacizumab 1.25mg /0.8ml   Route: Intravitreal, Site: Left Eye   NDC: H061816, Lot: [id number] , Expiration date: 04/09/2021   Post-op Post injection exam found visual acuity of at least counting fingers. The patient tolerated the procedure well. There were no complications. The patient received written and verbal post procedure care education. Post injection medications were not given.            ASSESSMENT/PLAN:    ICD-10-CM   1. 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 Retinal hole of both eyes  H33.323     4. Essential hypertension  I10     5. Hypertensive retinopathy of both eyes  H35.033 Fluorescein Angiography Optos (Transit OD)      1. Severe Non-proliferative diabetic retinopathy, both eyes - s/p IVA OD #1, 02.03.23 for DME - exam shows scattered MA, DBH OU; +edema OU (OD > OS) - OCT shows OD - interval improvement in IRF/DME and foveal contour; OS persistent DME - FA 2.7.23 shows leaking MA OU, vascular perfusion defects OU; no NV OU - recommend IVA OS #1 today, 02.07.23 for DME - pt wishes to proceed - RBA of procedure discussed, questions answered - informed consent obtained and signed - see procedure note - f/u 1 week, DFE, OCT, laser retinopexy OD  2,3. Lattice degeneration w/ atrophic holes, both eyes - OD: lattice with atrophic holes from 1030-1100 - OS: lattice with atrophic holes at 1100 - discussed findings, prognosis, and treatment options including observation - recommend laser retinopexy OU, OD first, but pts insurance requires prior authorization for  procedure - f/u Tuesday for laser retinopexy OD  4,5. Hypertensive retinopathy OU - discussed importance of tight BP control - monitor  Ophthalmic Meds Ordered this visit:  Meds ordered this encounter  Medications   Bevacizumab (AVASTIN) SOLN 1.25 mg     Return for Return Tuesday at 9:45 for Retinopexy OD, DFE, OCT.  There are no Patient Instructions on file for this visit.  Explained the diagnoses, plan, and follow up with the patient and they expressed understanding.  Patient expressed understanding of the importance of proper follow up care.   This document serves as a record of services personally performed by Tuesday, MD, PhD. It was created on their behalf by Gardiner Sleeper, an ophthalmic technician. The creation of this record is the provider's dictation and/or activities during the visit.    Electronically signed by: Leonie Douglas COA, 03/05/21  12:45 AM   This document serves as a record of services personally performed by 15/08/23, MD, PhD. It was created on their behalf by Gardiner Sleeper. San Jetty, OA an ophthalmic technician. The creation of this record is the provider's dictation and/or activities during the visit.    Electronically signed by: Owens Shark. San Jetty, Owens Shark 02.03.2023 12:45 AM  15.03.2023, M.D., Ph.D. Diseases & Surgery of the Retina and Vitreous [facility]  I have reviewed the above documentation for accuracy and completeness, and I agree with  the above. Gardiner Sleeper, M.D., Ph.D. 02/28/21 12:45 AM   Abbreviations: M myopia (nearsighted); A astigmatism; H hyperopia (farsighted); P presbyopia; Mrx spectacle prescription;  CTL contact lenses; OD right eye; OS left eye; OU both eyes  XT exotropia; ET esotropia; PEK punctate epithelial keratitis; PEE punctate epithelial erosions; DES dry eye syndrome; MGD meibomian gland dysfunction; ATs artificial tears; PFAT's preservative free artificial tears; [place] nuclear sclerotic cataract;  PSC posterior subcapsular cataract; ERM epi-retinal membrane; PVD posterior vitreous detachment; RD retinal detachment; DM diabetes mellitus; DR diabetic retinopathy; NPDR non-proliferative diabetic retinopathy; PDR proliferative diabetic retinopathy; CSME clinically significant macular edema; DME diabetic macular edema; dbh dot blot hemorrhages; CWS cotton wool spot; POAG primary open angle glaucoma; C/D cup-to-disc ratio; HVF humphrey visual field; GVF goldmann visual field; OCT optical coherence tomography; IOP intraocular pressure; BRVO Branch retinal vein occlusion; CRVO central retinal vein occlusion; CRAO central retinal artery occlusion; BRAO branch retinal artery occlusion; RT retinal tear; SB scleral buckle; PPV pars plana vitrectomy; VH Vitreous hemorrhage; PRP panretinal laser photocoagulation; IVK intravitreal kenalog; VMT vitreomacular traction; MH Macular hole;  NVD neovascularization of the disc; NVE neovascularization elsewhere; AREDS age related eye disease study; ARMD age related macular degeneration; POAG primary open angle glaucoma; EBMD epithelial/anterior basement membrane dystrophy; ACIOL anterior chamber intraocular lens; IOL intraocular lens; PCIOL posterior chamber intraocular lens; Phaco/IOL phacoemulsification with intraocular lens placement; [place] photorefractive keratectomy; LASIK laser assisted in situ keratomileusis; HTN hypertension; DM diabetes mellitus; COPD chronic obstructive pulmonary disease

## 2021-03-06 NOTE — Progress Notes (Signed)
[facility] Note  03/11/2021     CHIEF COMPLAINT Patient presents for Retina Follow Up   HISTORY OF PRESENT ILLNESS: Shaun Cummings is a 46 y.o. male who presents to the clinic today for:   HPI     Retina Follow Up   Patient presents with  Diabetic Retinopathy.  In both eyes.  Severity is moderate.  Duration of 1 week.  Since onset it is stable.  I, the attending physician,  performed the HPI with the patient and updated documentation appropriately.        Comments   Pt here for 1 wk ret f/u NPDR OU, lattice degen OD. Pt states no VA change. Here for laser OD.       Last edited by Bernarda Caffey, MD on 03/11/2021 12:04 PM.     Patient here for laser retinopexy OD  Referring physician: Shirleen Schirmer, PA-C  Iona Beard, MD [place],  [place] [address]  HISTORICAL INFORMATION:   Selected notes from the MEDICAL RECORD NUMBER Referred by Gwenlyn Perking / [facility] for concern of diabetic retinopathy LEE:  Ocular Hx- PMH-    CURRENT MEDICATIONS: No current outpatient medications on file. (Ophthalmic Drugs)   No current facility-administered medications for this visit. (Ophthalmic Drugs)   Current Outpatient Medications (Other)  Medication Sig   Continuous Blood Gluc Sensor (FREESTYLE LIBRE 3 SENSOR) MISC 1 each by Does not apply route as directed. Place 1 sensor on the skin every 14 days. Use to check glucose continuously   empagliflozin (JARDIANCE) 10 MG TABS tablet Take 1 tablet (10 mg total) by mouth daily before breakfast.   insulin detemir (LEVEMIR) 100 unit/ml SOLN Inject 10 Units into the skin at bedtime. (Patient taking differently: Inject 15 Units into the skin at bedtime. Per Dr. Lisabeth Devoid)   Insulin Pen Needle (PEN NEEDLES) 32G X 4 MM MISC 1 Units by Does not apply route daily.   losartan-hydrochlorothiazide (HYZAAR) 50-12.5 MG tablet Take 1 tablet by mouth daily.   rosuvastatin (CRESTOR) 20 MG tablet Take 1 tablet (20 mg  total) by mouth daily.   valsartan (DIOVAN) 80 MG tablet Take 1 tablet (80 mg total) by mouth daily.   acetaminophen (TYLENOL) 325 MG tablet Take 2 tablets (650 mg total) by mouth every 6 (six) hours as needed for mild pain (or temp > 100).   amitriptyline (ELAVIL) 25 MG tablet Take 25 mg by mouth at bedtime. (Patient not taking: Reported on 02/28/2021)   ibuprofen (ADVIL) 600 MG tablet Take 600 mg by mouth every 6 (six) hours as needed.   No current facility-administered medications for this visit. (Other)   REVIEW OF SYSTEMS: ROS   Positive for: Endocrine, Eyes Negative for: Constitutional, Gastrointestinal, Neurological, Skin, Genitourinary, Musculoskeletal, HENT, Cardiovascular, Respiratory, Psychiatric, Allergic/Imm, Heme/Lymph Last edited by Kingsley Spittle, COT on 03/11/2021  9:18 AM.     ALLERGIES Allergies  Allergen Reactions   Augmentin [Amoxicillin-Pot Clavulanate] Hives and Itching    Has patient had a PCN reaction causing immediate rash, facial/tongue/throat swelling, SOB or lightheadedness with hypotension: Yes Has patient had a PCN reaction causing severe rash involving mucus membranes or skin necrosis: No Has patient had a PCN reaction that required hospitalization: No Has patient had a PCN reaction occurring within the last 10 years: No If all of the above answers are "NO", then may proceed with Cephalosporin use.   Contrast Media [Iodinated Contrast Media] Hives    Pt broke  out with 1 hive on forehead after CT injection-treated with 78m Benadryl   PAST MEDICAL HISTORY Past Medical History:  Diagnosis Date   Chest pain    Hyperlipidemia    Lesion of penis    foreskin   OSA (obstructive sleep apnea)    per pt dx osa and used cpap but after losing wt. from 415 pounds down to 244 pounds no longer needs cpap   Peripheral neuropathy    Phimosis    Scrotal abscess 05/08/2017   Type 2 diabetes mellitus ([place]    per pt no meds for two years pt was changed to levemir  unable to afford but pt states is waiting on approval for a program he applied for that will pay for his meds ([place]     Past Surgical History:  Procedure Laterality Date   CIRCUMCISION N/A 02/10/2016   Procedure: CIRCUMCISION ADULT;  Surgeon: PCleon Gustin MD;  [facility]  Service: Urology;  Laterality: N/A;   INCISION AND DRAINAGE ABSCESS Right 05/12/2017   Procedure: INCISION AND DRAINAGE RIGHT INGUINAL ABSCESS;  Surgeon: CErroll Luna MD;  [facility];  Service: General;  Laterality: Right;   INCISION AND DRAINAGE ABSCESS N/A 03/24/2018   Procedure: INCISION AND DRAINAGE POSTERIOR NECK ABSCESS;  Surgeon: HExcell Seltzer MD;  [facility];  Service: General;  Laterality: N/A;   INCISION AND DRAINAGE ABSCESS Right 06/19/2020   Procedure: INCISION AND DRAINAGE ABSCESS, RIGHT GROIN;  Surgeon: MJohnathan Hausen MD;  [facility];  Service: General;  Laterality: Right;   INCISION AND DRAINAGE PERIRECTAL ABSCESS Right 05/09/2017   Procedure: IRRIGATION AND DEBRIDEMENT PERINEAL ABSCESS;  Surgeon: TDonnie Mesa MD;  [facility]  Service: General;  Laterality: Right;   NO PAST SURGERIES     FAMILY HISTORY Family History  Problem Relation Age of Onset   Diabetes Mother    Angina Mother    CAD Mother    Glaucoma Father    Diabetes Father    Heart attack Father    Kidney failure Father    Diabetes Sister    Diabetes Brother    Glaucoma Maternal Grandmother    Diabetes Maternal Grandmother    Glaucoma Maternal Grandfather    Diabetes Maternal Grandfather    Glaucoma Paternal Grandmother    Diabetes Paternal Grandmother    Glaucoma Paternal Grandfather    Diabetes Paternal Grandfather    SOCIAL HISTORY Social History   Tobacco Use   Smoking status: Former    Years: 2.00    Types: Cigarettes    Quit date: 02/05/2008    Years since quitting: 13.1   Smokeless tobacco: Never  Vaping Use   Vaping Use: Never used   Substance Use Topics   Alcohol use: No   Drug use: No       OPHTHALMIC EXAM:  Base Eye Exam     Visual Acuity (Snellen - Linear)       Right Left   Dist cc 20/150 20/40 -1   Dist ph cc 20/100 -1 NI    Correction: Glasses         Tonometry (Tonopen, 9:26 AM)       Right Left   Pressure 19 21         Pupils       Dark Light Shape React APD   Right 4 3 Round Brisk None   Left 4 3 Round Brisk None  Visual Fields (Counting fingers)       Left Right    Full Full         Extraocular Movement       Right Left    Full, Ortho Full, Ortho         Neuro/Psych     Oriented x3: Yes   Mood/Affect: Normal         Dilation     Right eye: 1.0% Mydriacyl, 2.5% Phenylephrine @ 9:27 AM           Slit Lamp and Fundus Exam     Slit Lamp Exam       Right Left   Lids/Lashes Dermatochalasis - upper lid Dermatochalasis - upper lid   Conjunctiva/Sclera White and quiet White and quiet   Cornea Clear Clear   Anterior Chamber Deep and quiet Deep and quiet   Iris Round and dilated, No NVI Round and dilated, No NVI   Lens Clear Clear   Anterior Vitreous Vitreous syneresis Vitreous syneresis         Fundus Exam       Right Left   Disc Pink and Sharp, no NVD Pink and Sharp, no NVD   C/D Ratio 0.3 0.3   Macula Blunted foveal reflex, scattered MA/DBH, scattered exudates, +edema greatest centrally, +CWS Blunted foveal reflex, scattered MA/DBH, scattered exudates, +CWS   Vessels attenuated, Tortuous, AV crossing changes, ?early NVE attenuated, Tortuous, AV crossing changes   Periphery Attached, multiple patches of lattice degeneration with atrophic holes: from 1030-0200 superiorly; 0400 and 0730-0800 inferiorly Attached, lattice degeneration with atrophic holes from 1030-1100           Refraction     Wearing Rx       Sphere Cylinder Axis   Right -2.25 Sphere    Left -4.75 +0.75 170           IMAGING AND PROCEDURES  Imaging and  Procedures for 03/11/2021  OCT, Retina - OU - Both Eyes       Right Eye Quality was good. Central Foveal Thickness: 418. Progression has improved. Findings include abnormal foveal contour, intraretinal fluid, intraretinal hyper-reflective material, no SRF, vitreomacular adhesion (Mild Interval improvement in IRF/edema/IRHM).   Left Eye Quality was good. Central Foveal Thickness: 307. Progression has improved. Findings include abnormal foveal contour, intraretinal hyper-reflective material, intraretinal fluid (Mild interval improvement in IRF/IRHM).   Notes *Images captured and stored on drive  Diagnosis / Impression:  +DME OU (OD > OS) OD: Mild Interval improvement in IRF/edema/IRHM OS: Mild interval improvement in IRF/IRHM  Clinical management:  See below  Abbreviations: NFP - Normal foveal profile. CME - cystoid macular edema. PED - pigment epithelial detachment. IRF - intraretinal fluid. SRF - subretinal fluid. EZ - ellipsoid zone. ERM - epiretinal membrane. ORA - outer retinal atrophy. ORT - outer retinal tubulation. SRHM - subretinal hyper-reflective material. IRHM - intraretinal hyper-reflective material      Repair Retinal Breaks, Laser - OD - Right Eye       LASER PROCEDURE NOTE  Procedure:  Barrier laser retinopexy using slit lamp laser, RIGHT eye   Diagnosis:   Lattice degeneration w/ atrophic holes, RIGHT eye                     Patches of lattice: 1030-0200 superiorly; 0400 and 0730-0800 inferiorly  Surgeon: Bernarda Caffey, MD, PhD  Anesthesia: Topical  Informed consent obtained, operative eye marked, and time out performed prior to initiation of laser.  Laser settings:  Lumenis [id number] laser, slit lamp Lens: [place] PRP 165 Power: 290 mW Spot size: 200 microns Duration: 30 msec  # spots: 1193  Placement of laser: Using a Mainster PRP 165 contact lens at the slit lamp, laser was placed in three confluent rows around patches of lattice w/ atrophic holes  from 1030-0200 superiorly, and at 0400 and 0730-0800 inferiorly, anterior to equator.  Complications: None.  Patient tolerated the procedure well and received written and verbal post-procedure care information/education.            ASSESSMENT/PLAN:    ICD-10-CM   1. Severe nonproliferative diabetic retinopathy of both eyes with macular edema associated with type 2 diabetes mellitus (HCC)  E11.3413 OCT, Retina - OU - Both Eyes    2. Bilateral retinal lattice degeneration  H35.413 Repair Retinal Breaks, Laser - OD - Right Eye    3. Retinal hole of both eyes  H33.323 Repair Retinal Breaks, Laser - OD - Right Eye    4. Essential hypertension  I10     5. Hypertensive retinopathy of both eyes  H35.033       1. Severe Non-proliferative diabetic retinopathy, both eyes - s/p IVA OD #1 (02.03.23) for DME - s/p IVA OS #1 (02.07.23) for DME - exam shows scattered MA, DBH OU; +edema OU (OD > OS) - OCT shows OD: Mild Interval improvement in IRF/edema/IRHM; OS: Mild interval improvement in IRF/IRHM - FA 2.7.23 shows leaking MA OU, vascular perfusion defects OU; no NV OU - will be due for IVA OU #2 on 3.7.23  2,3. Lattice degeneration w/ atrophic holes, both eyes - OD: lattice with atrophic holes from 1030-0200 superiorly; 0400 and 0730-0800 inferiorly - OS: lattice with atrophic holes at 1100 - discussed findings, prognosis, and treatment options including observation - recommend laser retinopexy OD today, 02.14.23 - pt wishes to proceed with laser - RBA of procedure discussed, questions answered - informed consent obtained and signed - see procedure note - start Lotemax SM OD x5-7 days - f/u 2 weeks, laser retinopexy OS  4,5. Hypertensive retinopathy OU - discussed importance of tight BP control - monitor  Ophthalmic Meds Ordered this visit:  No orders of the defined types were placed in this encounter.    Return in about 2 weeks (around 03/25/2021) for f/u 02.28.22 at 10:00  lattice degeneration OU, DFE, OCT.  There are no Patient Instructions on file for this visit.  Explained the diagnoses, plan, and follow up with the patient and they expressed understanding.  Patient expressed understanding of the importance of proper follow up care.   This document serves as a record of services personally performed by Gardiner Sleeper, MD, PhD. It was created on their behalf by San Jetty. Owens Shark, OA an ophthalmic technician. The creation of this record is the provider's dictation and/or activities during the visit.    Electronically signed by: San Jetty. Owens Shark, [place] 02.09.2023 12:22 PM  Gardiner Sleeper, M.D., Ph.D. Diseases & Surgery of the Retina and Vitreous [facility]  I have reviewed the above documentation for accuracy and completeness, and I agree with the above. Gardiner Sleeper, M.D., Ph.D. 03/11/21 12:22 PM  Abbreviations: M myopia (nearsighted); A astigmatism; H hyperopia (farsighted); P presbyopia; Mrx spectacle prescription;  CTL contact lenses; OD right eye; OS left eye; OU both eyes  XT exotropia; ET esotropia; PEK punctate epithelial keratitis; PEE punctate epithelial erosions; DES dry eye syndrome; MGD meibomian gland dysfunction; ATs artificial tears;  PFAT's preservative free artificial tears; [place] nuclear sclerotic cataract; PSC posterior subcapsular cataract; ERM epi-retinal membrane; PVD posterior vitreous detachment; RD retinal detachment; DM diabetes mellitus; DR diabetic retinopathy; NPDR non-proliferative diabetic retinopathy; PDR proliferative diabetic retinopathy; CSME clinically significant macular edema; DME diabetic macular edema; dbh dot blot hemorrhages; CWS cotton wool spot; POAG primary open angle glaucoma; C/D cup-to-disc ratio; HVF humphrey visual field; GVF goldmann visual field; OCT optical coherence tomography; IOP intraocular pressure; BRVO Branch retinal vein occlusion; CRVO central retinal vein occlusion; CRAO central  retinal artery occlusion; BRAO branch retinal artery occlusion; RT retinal tear; SB scleral buckle; PPV pars plana vitrectomy; VH Vitreous hemorrhage; PRP panretinal laser photocoagulation; IVK intravitreal kenalog; VMT vitreomacular traction; MH Macular hole;  NVD neovascularization of the disc; NVE neovascularization elsewhere; AREDS age related eye disease study; ARMD age related macular degeneration; POAG primary open angle glaucoma; EBMD epithelial/anterior basement membrane dystrophy; ACIOL anterior chamber intraocular lens; IOL intraocular lens; PCIOL posterior chamber intraocular lens; Phaco/IOL phacoemulsification with intraocular lens placement; [place] photorefractive keratectomy; LASIK laser assisted in situ keratomileusis; HTN hypertension; DM diabetes mellitus; COPD chronic obstructive pulmonary disease

## 2021-03-07 ENCOUNTER — Other Ambulatory Visit: Payer: Self-pay | Admitting: Internal Medicine

## 2021-03-07 ENCOUNTER — Telehealth: Payer: Self-pay | Admitting: *Deleted

## 2021-03-07 MED ORDER — FREESTYLE LIBRE 3 SENSOR MISC: 1.0000 | 0 refills | Status: DC

## 2021-03-07 NOTE — Telephone Encounter (Signed)
Patient calling back about prescription. It has been three days.

## 2021-03-07 NOTE — Telephone Encounter (Signed)
Returned call to patient. States he called the [employer] and he qualifies for a free Libre 3. He is requesting 1 libre to be sent to [facility] on [address]. States he does not have a back up meter as he is unable to prick his fingers. He has not had a CBG reading in 3 days.

## 2021-03-10 ENCOUNTER — Encounter: Payer: 59 | Admitting: Pharmacist

## 2021-03-11 ENCOUNTER — Other Ambulatory Visit: Payer: Self-pay

## 2021-03-11 ENCOUNTER — Encounter (INDEPENDENT_AMBULATORY_CARE_PROVIDER_SITE_OTHER): Payer: Self-pay | Admitting: Ophthalmology

## 2021-03-11 ENCOUNTER — Ambulatory Visit (INDEPENDENT_AMBULATORY_CARE_PROVIDER_SITE_OTHER): Payer: 59 | Admitting: Ophthalmology

## 2021-03-11 ENCOUNTER — Other Ambulatory Visit: Payer: Self-pay | Admitting: Dietician

## 2021-03-11 DIAGNOSIS — E1139 Type 2 diabetes mellitus with other diabetic ophthalmic complication: Secondary | ICD-10-CM

## 2021-03-11 DIAGNOSIS — E113413 Type 2 diabetes mellitus with severe nonproliferative diabetic retinopathy with macular edema, bilateral: Secondary | ICD-10-CM

## 2021-03-11 DIAGNOSIS — I10 Essential (primary) hypertension: Secondary | ICD-10-CM | POA: Diagnosis not present

## 2021-03-11 DIAGNOSIS — H35413 Lattice degeneration of retina, bilateral: Secondary | ICD-10-CM | POA: Diagnosis not present

## 2021-03-11 DIAGNOSIS — Z794 Long term (current) use of insulin: Secondary | ICD-10-CM

## 2021-03-11 DIAGNOSIS — H35033 Hypertensive retinopathy, bilateral: Secondary | ICD-10-CM | POA: Diagnosis not present

## 2021-03-11 DIAGNOSIS — H33323 Round hole, bilateral: Secondary | ICD-10-CM

## 2021-03-11 MED ORDER — FREESTYLE LIBRE 3 SENSOR MISC: 1.0000 | 11 refills | Status: DC

## 2021-03-11 NOTE — Telephone Encounter (Signed)
Needs 2 sensors per month

## 2021-03-12 ENCOUNTER — Encounter: Payer: Self-pay | Admitting: Dietician

## 2021-03-17 NOTE — Progress Notes (Signed)
[facility] Note  03/25/2021     CHIEF COMPLAINT Patient presents for Retina Follow Up   HISTORY OF PRESENT ILLNESS: Shaun Cummings is a 46 y.o. male who presents to the clinic today for:   HPI     Retina Follow Up   Patient presents with  Other.  In both eyes.  This started 2 weeks ago.  I, the attending physician,  performed the HPI with the patient and updated documentation appropriately.        Comments   Patient here for 2 weeks retina follow up for laser OS, laser OD (03-11-21) Patient states vision about the same. Feels like OD has light pressure inside eye.       Last edited by Bernarda Caffey, MD on 03/25/2021 11:57 AM.    Patient here for laser retinopexy OS  Referring physician: Shirleen Schirmer, PA-C  Iona Beard, MD [place],  [place] [id number]  HISTORICAL INFORMATION:   Selected notes from the MEDICAL RECORD NUMBER Referred by Gwenlyn Perking / [facility] for concern of diabetic retinopathy LEE:  Ocular Hx- PMH-    CURRENT MEDICATIONS: No current outpatient medications on file. (Ophthalmic Drugs)   No current facility-administered medications for this visit. (Ophthalmic Drugs)   Current Outpatient Medications (Other)  Medication Sig   Continuous Blood Gluc Sensor (FREESTYLE LIBRE 2 SENSOR) MISC Place 1 sensor on the skin every 14 days. Use to check glucose continuously   empagliflozin (JARDIANCE) 10 MG TABS tablet Take 1 tablet (10 mg total) by mouth daily before breakfast.   insulin detemir (LEVEMIR) 100 unit/ml SOLN Inject 10 Units into the skin at bedtime. (Patient taking differently: Inject 15 Units into the skin at bedtime. Per Dr. Lisabeth Devoid)   Insulin Pen Needle (PEN NEEDLES) 32G X 4 MM MISC 1 Units by Does not apply route daily.   valsartan (DIOVAN) 80 MG tablet Take 1 tablet (80 mg total) by mouth daily.   acetaminophen (TYLENOL) 325 MG tablet Take 2 tablets (650 mg total) by mouth every 6 (six) hours as needed for  mild pain (or temp > 100).   amitriptyline (ELAVIL) 25 MG tablet Take 25 mg by mouth at bedtime. (Patient not taking: Reported on 02/28/2021)   ibuprofen (ADVIL) 600 MG tablet Take 600 mg by mouth every 6 (six) hours as needed.   losartan-hydrochlorothiazide (HYZAAR) 50-12.5 MG tablet Take 1 tablet by mouth daily.   rosuvastatin (CRESTOR) 20 MG tablet Take 1 tablet (20 mg total) by mouth daily.   No current facility-administered medications for this visit. (Other)   REVIEW OF SYSTEMS: ROS   Positive for: Endocrine, Eyes Negative for: Constitutional, Gastrointestinal, Neurological, Skin, Genitourinary, Musculoskeletal, HENT, Cardiovascular, Respiratory, Psychiatric, Allergic/Imm, Heme/Lymph Last edited by Theodore Demark, COA on 03/25/2021  9:43 AM.     ALLERGIES Allergies  Allergen Reactions   Augmentin [Amoxicillin-Pot Clavulanate] Hives and Itching    Has patient had a PCN reaction causing immediate rash, facial/tongue/throat swelling, SOB or lightheadedness with hypotension: Yes Has patient had a PCN reaction causing severe rash involving mucus membranes or skin necrosis: No Has patient had a PCN reaction that required hospitalization: No Has patient had a PCN reaction occurring within the last 10 years: No If all of the above answers are "NO", then may proceed with Cephalosporin use.   Contrast Media [Iodinated Contrast Media] Hives    Pt broke out with 1 hive on forehead after CT injection-treated with 25mg  Benadryl  PAST MEDICAL HISTORY Past Medical History:  Diagnosis Date   Chest pain    Hyperlipidemia    Lesion of penis    foreskin   OSA (obstructive sleep apnea)    per pt dx osa and used cpap but after losing wt. from 415 pounds down to 244 pounds no longer needs cpap   Peripheral neuropathy    Phimosis    Scrotal abscess 05/08/2017   Type 2 diabetes mellitus ([place])    per pt no meds for two years pt was changed to levemir unable to afford but pt states is waiting on  approval for a program he applied for that will pay for his meds ([place])     Past Surgical History:  Procedure Laterality Date   CIRCUMCISION N/A 02/10/2016   Procedure: CIRCUMCISION ADULT;  Surgeon: Cleon Gustin, MD;  Location: [facility];  Service: Urology;  Laterality: N/A;   INCISION AND DRAINAGE ABSCESS Right 05/12/2017   Procedure: INCISION AND DRAINAGE RIGHT INGUINAL ABSCESS;  Surgeon: Erroll Luna, MD;  Location: [facility];  Service: General;  Laterality: Right;   INCISION AND DRAINAGE ABSCESS N/A 03/24/2018   Procedure: INCISION AND DRAINAGE POSTERIOR NECK ABSCESS;  Surgeon: Excell Seltzer, MD;  Location: [facility];  Service: General;  Laterality: N/A;   INCISION AND DRAINAGE ABSCESS Right 06/19/2020   Procedure: INCISION AND DRAINAGE ABSCESS, RIGHT GROIN;  Surgeon: Johnathan Hausen, MD;  Location: [facility];  Service: General;  Laterality: Right;   INCISION AND DRAINAGE PERIRECTAL ABSCESS Right 05/09/2017   Procedure: IRRIGATION AND DEBRIDEMENT PERINEAL ABSCESS;  Surgeon: Donnie Mesa, MD;  Location: [facility];  Service: General;  Laterality: Right;   NO PAST SURGERIES     FAMILY HISTORY Family History  Problem Relation Age of Onset   Diabetes Mother    Angina Mother    CAD Mother    Glaucoma Father    Diabetes Father    Heart attack Father    Kidney failure Father    Diabetes Sister    Diabetes Brother    Glaucoma Maternal Grandmother    Diabetes Maternal Grandmother    Glaucoma Maternal Grandfather    Diabetes Maternal Grandfather    Glaucoma Paternal Grandmother    Diabetes Paternal Grandmother    Glaucoma Paternal Grandfather    Diabetes Paternal Grandfather    SOCIAL HISTORY Social History   Tobacco Use   Smoking status: Former    Years: 2.00    Types: Cigarettes    Quit date: 02/05/2008    Years since quitting: 13.1   Smokeless tobacco: Never  Vaping Use   Vaping Use: Never used  Substance Use Topics   Alcohol use: No   Drug  use: No       OPHTHALMIC EXAM:  Base Eye Exam     Visual Acuity (Snellen - Linear)       Right Left   Dist cc 20/150 20/40   Dist ph cc 20/100 NI    Correction: Glasses         Tonometry (Tonopen, 9:40 AM)       Right Left   Pressure 09 11         Pupils       Dark Light Shape React APD   Right 4 3 Round Brisk None   Left 4 3 Round Brisk None         Visual Fields (Counting fingers)       Left Right    Full  Full         Extraocular Movement       Right Left    Full, Ortho Full, Ortho         Neuro/Psych     Oriented x3: Yes   Mood/Affect: Normal         Dilation     Both eyes: 1.0% Mydriacyl, 2.5% Phenylephrine @ 9:40 AM           Slit Lamp and Fundus Exam     Slit Lamp Exam       Right Left   Lids/Lashes Dermatochalasis - upper lid Dermatochalasis - upper lid   Conjunctiva/Sclera White and quiet White and quiet   Cornea Clear Clear   Anterior Chamber Deep and quiet Deep and quiet   Iris Round and dilated, No NVI Round and dilated, No NVI   Lens Clear Clear   Anterior Vitreous Vitreous syneresis Vitreous syneresis         Fundus Exam       Right Left   Disc Pink and Sharp, no NVD Pink and Sharp, no NVD   C/D Ratio 0.3 0.3   Macula Blunted foveal reflex, scattered MA/DBH, scattered exudates, +edema greatest centrally, +CWS Blunted foveal reflex, scattered MA/DBH, scattered exudates, +CWS   Vessels attenuated, Tortuous, AV crossing changes, ?early NVE attenuated, Tortuous, AV crossing changes   Periphery Attached, multiple patches of lattice degeneration with atrophic holes: from 1030-0200 superiorly; 0400 and 0730-0800 inferiorly Attached, pigmented lattice degeneration with atrophic holes from 0400-0500 and 1030-1100           Refraction     Wearing Rx       Sphere Cylinder Axis   Right -2.25 Sphere    Left -4.75 +0.75 170           IMAGING AND PROCEDURES  Imaging and Procedures for 03/25/2021  OCT,  Retina - OU - Both Eyes       Right Eye Quality was good. Central Foveal Thickness: 419. Progression has been stable. Findings include abnormal foveal contour, intraretinal fluid, intraretinal hyper-reflective material, no SRF, vitreomacular adhesion (persistent IRF/edema/IRHM -- ?mild interval improvement).   Left Eye Quality was good. Central Foveal Thickness: 292. Progression has improved. Findings include abnormal foveal contour, intraretinal hyper-reflective material, intraretinal fluid (Mild interval improvement in IRF/IRHM).   Notes *Images captured and stored on drive  Diagnosis / Impression:  +DME OU (OD > OS) OD: persistent IRF/edema/IRHM -- ?mild interval improvement OS: Mild interval improvement in IRF/IRHM  Clinical management:  See below  Abbreviations: NFP - Normal foveal profile. CME - cystoid macular edema. PED - pigment epithelial detachment. IRF - intraretinal fluid. SRF - subretinal fluid. EZ - ellipsoid zone. ERM - epiretinal membrane. ORA - outer retinal atrophy. ORT - outer retinal tubulation. SRHM - subretinal hyper-reflective material. IRHM - intraretinal hyper-reflective material      Repair Retinal Breaks, Laser - OS - Left Eye       LASER PROCEDURE NOTE  Procedure:  Barrier laser retinopexy using slit lamp laser, LEFT eye   Diagnosis:   Lattice degeneration w/ atrophic holes, LEFT eye                     Patches of lattice: 1030-1100 superiorly; 0400-0500 inferiorly  Surgeon: Bernarda Caffey, MD, PhD  Anesthesia: Topical  Informed consent obtained, operative eye marked, and time out performed prior to initiation of laser.   Laser settings:  Lumenis Smart532 laser, slit lamp Lens: Mainster PRP  165 Power: 280 mW Spot size: 200 microns Duration: 30 msec  # spots: 527  Placement of laser: Using a Mainster PRP 165 contact lens at the slit lamp, laser was placed in three confluent rows around patches of lattice w/ atrophic holes from 0400-0500 and  1030-1100 oclock anterior to equator  Complications: None.  Patient tolerated the procedure well and received written and verbal post-procedure care information/education.            ASSESSMENT/PLAN:    ICD-10-CM   1. Severe nonproliferative diabetic retinopathy of both eyes with macular edema associated with type 2 diabetes mellitus (HCC)  E11.3413 OCT, Retina - OU - Both Eyes    2. Bilateral retinal lattice degeneration  H35.413 Repair Retinal Breaks, Laser - OS - Left Eye    3. Retinal hole of both eyes  H33.323 Repair Retinal Breaks, Laser - OS - Left Eye    4. Essential hypertension  I10     5. Hypertensive retinopathy of both eyes  H35.033      1. Severe Non-proliferative diabetic retinopathy, both eyes - s/p IVA OD #1 (02.03.23) for DME - s/p IVA OS #1 (02.07.23) for DME - exam shows scattered MA, DBH OU; +edema OU (OD > OS) - FA 2.7.23 shows leaking MA OU, vascular perfusion defects OU; no NV OU - OCT shows OD: persistent IRF/edema/IRHM -- ?mild interval improvement; OS: Mild interval improvement in IRF/IRHM - f/u March 16, DFE, OCT, possible injections  2,3. Lattice degeneration w/ atrophic holes, both eyes - OD: lattice with atrophic holes from 1030-0200 superiorly; 0400 and 0730-0800 inferiorly - OS: pigmented lattice with atrophic holes from 0400-0500 and 1030-1100 - s/p laser retinopexy OD (02.14.23) - recommend laser retinopexy OS today, 02.28.22 - pt wishes to proceed with laser - RBA of procedure discussed, questions answered - informed consent obtained and signed - see procedure note - start Lotemax SM OS x5-7 days - f/u Mar 16 -- DFE/OCT, possible injections  4,5. Hypertensive retinopathy OU - discussed importance of tight BP control - monitor  Ophthalmic Meds Ordered this visit:  No orders of the defined types were placed in this encounter.    Return for f/u March 16, NPDR OU, DFE, OCT.  There are no Patient Instructions on file for this  visit.  Explained the diagnoses, plan, and follow up with the patient and they expressed understanding.  Patient expressed understanding of the importance of proper follow up care.   This document serves as a record of services personally performed by Gardiner Sleeper, MD, PhD. It was created on their behalf by San Jetty. Owens Shark, OA an ophthalmic technician. The creation of this record is the provider's dictation and/or activities during the visit.    Electronically signed by: San Jetty. Owens Shark, [place] 02.20.2023 12:01 PM  Gardiner Sleeper, M.D., Ph.D. Diseases & Surgery of the Retina and Vitreous [facility]  I have reviewed the above documentation for accuracy and completeness, and I agree with the above. Gardiner Sleeper, M.D., Ph.D. 03/25/21 12:01 PM   Abbreviations: M myopia (nearsighted); A astigmatism; H hyperopia (farsighted); P presbyopia; Mrx spectacle prescription;  CTL contact lenses; OD right eye; OS left eye; OU both eyes  XT exotropia; ET esotropia; PEK punctate epithelial keratitis; PEE punctate epithelial erosions; DES dry eye syndrome; MGD meibomian gland dysfunction; ATs artificial tears; PFAT's preservative free artificial tears; [place] nuclear sclerotic cataract; PSC posterior subcapsular cataract; ERM epi-retinal membrane; PVD posterior vitreous detachment; RD retinal detachment;  DM diabetes mellitus; DR diabetic retinopathy; NPDR non-proliferative diabetic retinopathy; PDR proliferative diabetic retinopathy; CSME clinically significant macular edema; DME diabetic macular edema; dbh dot blot hemorrhages; CWS cotton wool spot; POAG primary open angle glaucoma; C/D cup-to-disc ratio; HVF humphrey visual field; GVF goldmann visual field; OCT optical coherence tomography; IOP intraocular pressure; BRVO Branch retinal vein occlusion; CRVO central retinal vein occlusion; CRAO central retinal artery occlusion; BRAO branch retinal artery occlusion; RT retinal tear; SB scleral  buckle; PPV pars plana vitrectomy; VH Vitreous hemorrhage; PRP panretinal laser photocoagulation; IVK intravitreal kenalog; VMT vitreomacular traction; MH Macular hole;  NVD neovascularization of the disc; NVE neovascularization elsewhere; AREDS age related eye disease study; ARMD age related macular degeneration; POAG primary open angle glaucoma; EBMD epithelial/anterior basement membrane dystrophy; ACIOL anterior chamber intraocular lens; IOL intraocular lens; PCIOL posterior chamber intraocular lens; Phaco/IOL phacoemulsification with intraocular lens placement; [place] photorefractive keratectomy; LASIK laser assisted in situ keratomileusis; HTN hypertension; DM diabetes mellitus; COPD chronic obstructive pulmonary disease

## 2021-03-18 ENCOUNTER — Other Ambulatory Visit: Payer: Self-pay | Admitting: Dietician

## 2021-03-18 ENCOUNTER — Ambulatory Visit (INDEPENDENT_AMBULATORY_CARE_PROVIDER_SITE_OTHER): Payer: 59 | Admitting: Dietician

## 2021-03-18 ENCOUNTER — Encounter: Payer: Self-pay | Admitting: Dietician

## 2021-03-18 DIAGNOSIS — E1139 Type 2 diabetes mellitus with other diabetic ophthalmic complication: Secondary | ICD-10-CM

## 2021-03-18 DIAGNOSIS — Z794 Long term (current) use of insulin: Secondary | ICD-10-CM

## 2021-03-18 NOTE — Progress Notes (Signed)
Medical Nutrition Therapy Via Telemedicine  Appt start time: 0925 a.m. end time:  1000 a.m.  Total time: 35 min Visit # 2   Assessment:  Primary concerns today: Patient states that he has recently started on 10 units of levemir and is wondering if it is enough due to tingling in his feet due to neuropathy. He states the tingling in feet is better, his diarrhea is  a bit better, he has been monitoring his blood sugar levels consistently with Continuous glucose monitoring. He is having trouble affording the [place] 3 so wants to change back to the [facility] 2.   Patient states he does not want further intervention to assist with healthier meal choices. He is still working on some changes. 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  Patient states he is getting injections in his eyes that is very painful and is scheduled to continue this for the next year.  His Goal is a1c<10%. 180-220 range. He does not agree with a target range of  70-180 mg/dL right now. He states the insulin and jardiance he is on now are sufficient. He would like to work on  taking his insulin more consistently.   ANTHROPOMETRICS: weight done today with a jacket on Estimated body mass index is 33.27 kg/m as calculated from the following:   Height as of 02/03/21: 6' (1.829 m).   Weight as of this encounter: 245 lb 4.8 oz (111.3 kg).  Wt Readings from Last 10 Encounters:  03/18/21 245 lb 4.8 oz (111.3 kg)  02/18/21 242 lb 3.2 oz (109.9 kg)  02/03/21 252 lb 6.4 oz (114.5 kg)  07/01/20 253 lb 6.4 oz (114.9 kg)  06/18/20 247 lb (112 kg)  06/10/20 245 lb (111.1 kg)  09/08/19 230 lb (104.3 kg)  03/28/18 250 lb 11.2 oz (113.7 kg)  03/24/18 247 lb 2.2 oz (112.1 kg)  03/14/18 257 lb 11.2 oz (116.9 kg)    WEIGHT HISTORY: Highest: 485# Lowest: 242# SLEEP: need to assess at future visit  MEDICATIONS: levemir and jardiance  BLOOD SUGAR:  Lab Results  Component Value Date   HGBA1C 13.4 (A) 02/03/2021   HGBA1C 10.0  (H) 06/18/2020   HGBA1C >14.0 03/09/2018     CGM Results from download:   % Time CGM active:   51 %   (Goal >70%)  Average glucose:   258 mg/dL for 14 days  Glucose management indicator:   9.5 %  Time in range (70-180 mg/dL):   4 %   (Goal >70%)  Time High (181-250 mg/dL):   46 %   (Goal < 25%)  Time Very High (>250 mg/dL):    50 %   (Goal < 5%)  Time Low (54-69 mg/dL):   0 %   (Goal <4%)  Time Very Low (<54 mg/dL):   0 %   (Goal <1%)  Coefficient of variation:   21.2 %   (Goal <36%)   Dietary intake: Not done in detail per patients request  Usual physical activity: none due to work schedule; 3rd shift   Estimated energy needs: 8144-8185 calories  Progress Towards Goal(s):  In progress.   Nutritional Diagnosis:  Knowledge deficit as related to unclear understanding of blood glucose and sensor glucose is improving as evidenced by patient interest in self-monitoring blood glucose levels.     Intervention:  Nutrition education about CGM report, usual target ranges.   Action Goal: Monitor blood glucose levels with Continuous glucose monitoring; make small diet changes (  reducing the amount of carbohydrates at breakfast).  Outcome goal: Regulate blood glucose levels.  Coordination of care: Communication with doctor to adjust insulin dosage  Teaching Method Utilized: Visual, Auditory,Hands on Handouts given during visit include:Continuous glucose monitoring and information  Barriers to learning/adherence to lifestyle change: none Demonstrated degree of understanding via:  Teach Back   Monitoring/Evaluation:  Dietary intake, exercise, blood glucose levels, and body weight in 6 weeks   Debera Lat, RD 03/18/2021 1:17 PM.

## 2021-03-18 NOTE — Telephone Encounter (Signed)
Patient requests a new prescription to be sent to his pharmacy for the [facility] 2.

## 2021-03-18 NOTE — Patient Instructions (Addendum)
If you keep having the nausea vomiting, Sweaty episodes. I suggest talking to both your eye doctor and your doctor here.   I will request a prescription for the [facility] 2.  I suggest considering to take your Levemir 15 units insulin in the morning if you are a morning person.   It is fine to take insulin at same time you take jardiance.   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 Your a1c will next be due 05/04/21.  We can follow up in 6 weeks   Butch Penny [phone]

## 2021-03-19 MED ORDER — FREESTYLE LIBRE 2 SENSOR MISC: 11 refills | Status: DC

## 2021-03-25 ENCOUNTER — Ambulatory Visit (INDEPENDENT_AMBULATORY_CARE_PROVIDER_SITE_OTHER): Payer: 59 | Admitting: Ophthalmology

## 2021-03-25 ENCOUNTER — Other Ambulatory Visit: Payer: Self-pay

## 2021-03-25 ENCOUNTER — Encounter (INDEPENDENT_AMBULATORY_CARE_PROVIDER_SITE_OTHER): Payer: Self-pay | Admitting: Ophthalmology

## 2021-03-25 DIAGNOSIS — E113413 Type 2 diabetes mellitus with severe nonproliferative diabetic retinopathy with macular edema, bilateral: Secondary | ICD-10-CM | POA: Diagnosis not present

## 2021-03-25 DIAGNOSIS — I10 Essential (primary) hypertension: Secondary | ICD-10-CM | POA: Diagnosis not present

## 2021-03-25 DIAGNOSIS — H35033 Hypertensive retinopathy, bilateral: Secondary | ICD-10-CM

## 2021-03-25 DIAGNOSIS — H35413 Lattice degeneration of retina, bilateral: Secondary | ICD-10-CM | POA: Diagnosis not present

## 2021-03-25 DIAGNOSIS — H33323 Round hole, bilateral: Secondary | ICD-10-CM | POA: Diagnosis not present

## 2021-03-26 ENCOUNTER — Encounter (INDEPENDENT_AMBULATORY_CARE_PROVIDER_SITE_OTHER): Payer: Self-pay

## 2021-04-01 NOTE — Progress Notes (Shared)
[facility] Note  04/10/2021     CHIEF COMPLAINT Patient presents for No chief complaint on file.   HISTORY OF PRESENT ILLNESS: Shaun Cummings is a 46 y.o. male who presents to the clinic today for:    Patient here for laser retinopexy OS  Referring physician: Shirleen Schirmer, PA-C  Iona Beard, MD [place],  [place] [address]  HISTORICAL INFORMATION:   Selected notes from the MEDICAL RECORD NUMBER Referred by Gwenlyn Perking / [facility] for concern of diabetic retinopathy LEE:  Ocular Hx- PMH-    CURRENT MEDICATIONS: No current outpatient medications on file. (Ophthalmic Drugs)   No current facility-administered medications for this visit. (Ophthalmic Drugs)   Current Outpatient Medications (Other)  Medication Sig   acetaminophen (TYLENOL) 325 MG tablet Take 2 tablets (650 mg total) by mouth every 6 (six) hours as needed for mild pain (or temp > 100).   amitriptyline (ELAVIL) 25 MG tablet Take 25 mg by mouth at bedtime. (Patient not taking: Reported on 02/28/2021)   Continuous Blood Gluc Sensor (FREESTYLE LIBRE 2 SENSOR) MISC Place 1 sensor on the skin every 14 days. Use to check glucose continuously   empagliflozin (JARDIANCE) 10 MG TABS tablet Take 1 tablet (10 mg total) by mouth daily before breakfast.   ibuprofen (ADVIL) 600 MG tablet Take 600 mg by mouth every 6 (six) hours as needed.   insulin detemir (LEVEMIR) 100 unit/ml SOLN Inject 10 Units into the skin at bedtime. (Patient taking differently: Inject 15 Units into the skin at bedtime. Per Dr. Lisabeth Devoid)   Insulin Pen Needle (PEN NEEDLES) 32G X 4 MM MISC 1 Units by Does not apply route daily.   losartan-hydrochlorothiazide (HYZAAR) 50-12.5 MG tablet Take 1 tablet by mouth daily.   rosuvastatin (CRESTOR) 20 MG tablet Take 1 tablet (20 mg total) by mouth daily.   valsartan (DIOVAN) 80 MG tablet Take 1 tablet (80 mg total) by mouth daily.   No current facility-administered  medications for this visit. (Other)   REVIEW OF SYSTEMS:   ALLERGIES Allergies  Allergen Reactions   Augmentin [Amoxicillin-Pot Clavulanate] Hives and Itching    Has patient had a PCN reaction causing immediate rash, facial/tongue/throat swelling, SOB or lightheadedness with hypotension: Yes Has patient had a PCN reaction causing severe rash involving mucus membranes or skin necrosis: No Has patient had a PCN reaction that required hospitalization: No Has patient had a PCN reaction occurring within the last 10 years: No If all of the above answers are "NO", then may proceed with Cephalosporin use.   Contrast Media [Iodinated Contrast Media] Hives    Pt broke out with 1 hive on forehead after CT injection-treated with 25mg  Benadryl   PAST MEDICAL HISTORY Past Medical History:  Diagnosis Date   Chest pain    Hyperlipidemia    Lesion of penis    foreskin   OSA (obstructive sleep apnea)    per pt dx osa and used cpap but after losing wt. from 415 pounds down to 244 pounds no longer needs cpap   Peripheral neuropathy    Phimosis    Scrotal abscess 05/08/2017   Type 2 diabetes mellitus ([place])    per pt no meds for two years pt was changed to levemir unable to afford but pt states is waiting on approval for a program he applied for that will pay for his meds ([place])     Past Surgical History:  Procedure Laterality Date  CIRCUMCISION N/A 02/10/2016   Procedure: CIRCUMCISION ADULT;  Surgeon: Cleon Gustin, MD;  Location: [facility];  Service: Urology;  Laterality: N/A;   INCISION AND DRAINAGE ABSCESS Right 05/12/2017   Procedure: INCISION AND DRAINAGE RIGHT INGUINAL ABSCESS;  Surgeon: Erroll Luna, MD;  Location: [facility];  Service: General;  Laterality: Right;   INCISION AND DRAINAGE ABSCESS N/A 03/24/2018   Procedure: INCISION AND DRAINAGE POSTERIOR NECK ABSCESS;  Surgeon: Excell Seltzer, MD;  Location: [facility];  Service: General;  Laterality: N/A;    INCISION AND DRAINAGE ABSCESS Right 06/19/2020   Procedure: INCISION AND DRAINAGE ABSCESS, RIGHT GROIN;  Surgeon: Johnathan Hausen, MD;  Location: [facility];  Service: General;  Laterality: Right;   INCISION AND DRAINAGE PERIRECTAL ABSCESS Right 05/09/2017   Procedure: IRRIGATION AND DEBRIDEMENT PERINEAL ABSCESS;  Surgeon: Donnie Mesa, MD;  Location: [facility];  Service: General;  Laterality: Right;   NO PAST SURGERIES     FAMILY HISTORY Family History  Problem Relation Age of Onset   Diabetes Mother    Angina Mother    CAD Mother    Glaucoma Father    Diabetes Father    Heart attack Father    Kidney failure Father    Diabetes Sister    Diabetes Brother    Glaucoma Maternal Grandmother    Diabetes Maternal Grandmother    Glaucoma Maternal Grandfather    Diabetes Maternal Grandfather    Glaucoma Paternal Grandmother    Diabetes Paternal Grandmother    Glaucoma Paternal Grandfather    Diabetes Paternal Grandfather    SOCIAL HISTORY Social History   Tobacco Use   Smoking status: Former    Years: 2.00    Types: Cigarettes    Quit date: 02/05/2008    Years since quitting: 13.1   Smokeless tobacco: Never  Vaping Use   Vaping Use: Never used  Substance Use Topics   Alcohol use: No   Drug use: No       OPHTHALMIC EXAM:  Not recorded    IMAGING AND PROCEDURES  Imaging and Procedures for 04/10/2021          ASSESSMENT/PLAN:    ICD-10-CM   1. Severe nonproliferative diabetic retinopathy of both eyes with macular edema associated with type 2 diabetes mellitus ([place])  T55.7322     2. 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 Retinal hole of both eyes  H33.323     4. Essential hypertension  I10     5. Hypertensive retinopathy of both eyes  H35.033      1. Severe Non-proliferative diabetic retinopathy, both eyes - s/p IVA OD #1 (02.03.23) for DME - s/p IVA OS #1 (02.07.23) for DME - exam shows scattered MA, DBH OU; +edema OU (OD > OS) - FA 2.7.23 shows  leaking MA OU, vascular perfusion defects OU; no NV OU - OCT shows OD: persistent IRF/edema/IRHM -- ?mild interval improvement; OS: Mild interval improvement in IRF/IRHM - recommend IVA OU #2 today, 03.16.23 - pt wishes to proceed with injections - RBA of procedure discussed, questions answered - informed consent obtained and signed - see procedure note - f/u March 16, DFE, OCT, possible injections  2,3. Lattice degeneration w/ atrophic holes, both eyes - OD: lattice with atrophic holes from 1030-0200 superiorly; 0400 and 0730-0800 inferiorly - OS: pigmented lattice with atrophic holes from 0400-0500 and 1030-1100 - s/p laser retinopexy OD (02.14.23) - s/p laser retinopexy OS (02.28.22) - pt wishes to proceed with laser -  RBA of procedure discussed, questions answered - informed consent obtained and signed - see procedure note - start Lotemax SM OS x5-7 days - f/u Mar 16 -- DFE/OCT, possible injections  4,5. Hypertensive retinopathy OU - discussed importance of tight BP control - monitor  Ophthalmic Meds Ordered this visit:  No orders of the defined types were placed in this encounter.    No follow-ups on file.  There are no Patient Instructions on file for this visit.  Explained the diagnoses, plan, and follow up with the patient and they expressed understanding.  Patient expressed understanding of the importance of proper follow up care.   This document serves as a record of services personally performed by Gardiner Sleeper, MD, PhD. It was created on their behalf by San Jetty. Owens Shark, OA an ophthalmic technician. The creation of this record is the provider's dictation and/or activities during the visit.    Electronically signed by: San Jetty. Marguerita Merles 03.07.2023 12:44 PM   Gardiner Sleeper, M.D., Ph.D. Diseases & Surgery of the Retina and Vitreous [facility]: M myopia (nearsighted); A astigmatism; H hyperopia (farsighted); P  presbyopia; Mrx spectacle prescription;  CTL contact lenses; OD right eye; OS left eye; OU both eyes  XT exotropia; ET esotropia; PEK punctate epithelial keratitis; PEE punctate epithelial erosions; DES dry eye syndrome; MGD meibomian gland dysfunction; ATs artificial tears; PFAT's preservative free artificial tears; Flushing nuclear sclerotic cataract; PSC posterior subcapsular cataract; ERM epi-retinal membrane; PVD posterior vitreous detachment; RD retinal detachment; DM diabetes mellitus; DR diabetic retinopathy; NPDR non-proliferative diabetic retinopathy; PDR proliferative diabetic retinopathy; CSME clinically significant macular edema; DME diabetic macular edema; dbh dot blot hemorrhages; CWS cotton wool spot; POAG primary open angle glaucoma; C/D cup-to-disc ratio; HVF humphrey visual field; GVF goldmann visual field; OCT optical coherence tomography; IOP intraocular pressure; BRVO Branch retinal vein occlusion; CRVO central retinal vein occlusion; CRAO central retinal artery occlusion; BRAO branch retinal artery occlusion; RT retinal tear; SB scleral buckle; PPV pars plana vitrectomy; VH Vitreous hemorrhage; PRP panretinal laser photocoagulation; IVK intravitreal kenalog; VMT vitreomacular traction; MH Macular hole;  NVD neovascularization of the disc; NVE neovascularization elsewhere; AREDS age related eye disease study; ARMD age related macular degeneration; POAG primary open angle glaucoma; EBMD epithelial/anterior basement membrane dystrophy; ACIOL anterior chamber intraocular lens; IOL intraocular lens; PCIOL posterior chamber intraocular lens; Phaco/IOL phacoemulsification with intraocular lens placement; [place] photorefractive keratectomy; LASIK laser assisted in situ keratomileusis; HTN hypertension; DM diabetes mellitus; COPD chronic obstructive pulmonary disease

## 2021-04-10 ENCOUNTER — Encounter (INDEPENDENT_AMBULATORY_CARE_PROVIDER_SITE_OTHER): Payer: 59 | Admitting: Ophthalmology

## 2021-04-10 DIAGNOSIS — H35413 Lattice degeneration of retina, bilateral: Secondary | ICD-10-CM

## 2021-04-10 DIAGNOSIS — H33323 Round hole, bilateral: Secondary | ICD-10-CM

## 2021-04-10 DIAGNOSIS — I10 Essential (primary) hypertension: Secondary | ICD-10-CM

## 2021-04-10 DIAGNOSIS — E113413 Type 2 diabetes mellitus with severe nonproliferative diabetic retinopathy with macular edema, bilateral: Secondary | ICD-10-CM

## 2021-04-10 DIAGNOSIS — H35033 Hypertensive retinopathy, bilateral: Secondary | ICD-10-CM

## 2021-04-30 ENCOUNTER — Encounter: Payer: Self-pay | Admitting: Student

## 2021-04-30 ENCOUNTER — Encounter: Payer: Self-pay | Admitting: Dietician

## 2021-04-30 ENCOUNTER — Ambulatory Visit (INDEPENDENT_AMBULATORY_CARE_PROVIDER_SITE_OTHER): Payer: 59 | Admitting: Dietician

## 2021-04-30 ENCOUNTER — Ambulatory Visit (INDEPENDENT_AMBULATORY_CARE_PROVIDER_SITE_OTHER): Payer: 59 | Admitting: Student

## 2021-04-30 VITALS — BP 184/105 | HR 77 | Temp 98.1°F | Ht 72.0 in | Wt 248.3 lb

## 2021-04-30 DIAGNOSIS — E1169 Type 2 diabetes mellitus with other specified complication: Secondary | ICD-10-CM

## 2021-04-30 DIAGNOSIS — Z794 Long term (current) use of insulin: Secondary | ICD-10-CM | POA: Diagnosis not present

## 2021-04-30 DIAGNOSIS — I10 Essential (primary) hypertension: Secondary | ICD-10-CM

## 2021-04-30 DIAGNOSIS — R3129 Other microscopic hematuria: Secondary | ICD-10-CM

## 2021-04-30 DIAGNOSIS — E1139 Type 2 diabetes mellitus with other diabetic ophthalmic complication: Secondary | ICD-10-CM | POA: Diagnosis not present

## 2021-04-30 DIAGNOSIS — R300 Dysuria: Secondary | ICD-10-CM | POA: Diagnosis not present

## 2021-04-30 DIAGNOSIS — E785 Hyperlipidemia, unspecified: Secondary | ICD-10-CM

## 2021-04-30 DIAGNOSIS — K921 Melena: Secondary | ICD-10-CM | POA: Diagnosis not present

## 2021-04-30 LAB — POCT GLYCOSYLATED HEMOGLOBIN (HGB A1C): Hemoglobin A1C: 9.6 % — AB (ref 4.0–5.6)

## 2021-04-30 LAB — GLUCOSE, CAPILLARY: Glucose-Capillary: 245 mg/dL — ABNORMAL HIGH (ref 70–99)

## 2021-04-30 MED ORDER — TRULICITY 0.75 MG/0.5ML ~~LOC~~ SOAJ: 0.7500 mg | SUBCUTANEOUS | 1 refills | Status: DC

## 2021-04-30 MED ORDER — AMLODIPINE BESYLATE 10 MG PO TABS: 10.0000 mg | ORAL_TABLET | Freq: Every day | ORAL | 2 refills | Status: DC

## 2021-04-30 MED ORDER — INSULIN DETEMIR 100 UNIT/ML FLEXPEN: 15.0000 [IU] | Freq: Every day | SUBCUTANEOUS | 2 refills | Status: DC

## 2021-04-30 MED ORDER — VALSARTAN 160 MG PO TABS: 160.0000 mg | ORAL_TABLET | Freq: Every day | ORAL | 11 refills | Status: DC

## 2021-04-30 NOTE — Assessment & Plan Note (Addendum)
A1c 13.42 months ago.  We accidentally check his A1c again which came down to 9.6.  Patient reports injecting 15 units of Levemir daily and taking Jardiance 10 mg daily.  He was taking metformin in the past but could not tolerate side effect due to abdominal cramping, diarrhea and bloody stool. ? ?Michela Pitcher that he has lost significant weights compared to before.  He is trying to eat well and be mindful of his food. ? ?Unfortunately did not bring his glucometer.  Said that he used to have [employer] but could not afford it due to the high cost.  He is currently checking his fasting CBG every other day and states that his CBGs usually in the 200s. ? ?- Continue Jardiance 10 mg and Levemir 15 units daily ?- Add Trulicity 1.03 mg weekly for weight loss benefit.  Titrate up as needed. ?- He is following in ophthalmology for his diabetic retinopathy ?- A1c in 3 months ?

## 2021-04-30 NOTE — Assessment & Plan Note (Addendum)
Patient report bloody stool in the past with taking metformin.  Currently denies bloody stool.  He however report pressure around his anus when he is sitting.  This has been going on for 1 year.  Endorses intermittent pressure-like sensation with urination but no dysuria.  He denies penile discharge, fever, chills or hematuria.  He endorses constipation and straining.  He is not sexually active since a year ago. ? ?Digital rectal exam was performed.  Prostate was smooth to palpation, no nodules palpated.  Prostate is nontender to palpation.  No external hemorrhoids seen.  His stool is brown, no blood seen. ? ?Low suspicion for prostatitis.  Given his history of hematochezia, constipation and straining, placed referral for GI for possible colonoscopy or at least anoscopy.  Patient is almost at the recommended age for colonoscopy. ? ?-GI referral placed ? ?

## 2021-04-30 NOTE — Patient Instructions (Addendum)
Shaun Cummings, ? ?It was a pleasure seeing you in the clinic today.  Here is a summary what we talked about: ? ?1.  Diabetes: Please continue Levemir 15 units daily and Jardiance.  I will add Trulicity 0.5 mg.  This is a weekly injection.  Try to check your fasting blood sugar as consistently as you can. ? ?2.  Hypertension: Your blood pressure is very high today.  I will increase Valsartan to 160 mg. I added Amlodipine 10 mg as well.  I will check your kidney function today. ? ?3.  Given your bloody stool and pressure when you sit, I will send you to a gastroenterology doctor. ? ?4.  I will check your cholesterol today. ? ?Please return in 2-3 weeks for blood pressure recheck and blood work ? ?Take care, ? ?Dr. Alfonse Spruce ?

## 2021-04-30 NOTE — Patient Instructions (Addendum)
Lab Results  ?Component Value Date  ? HGBA1C 9.6 (A) 04/30/2021  ? HGBA1C 13.4 (A) 02/03/2021  ? HGBA1C 10.0 (H) 06/18/2020  ? HGBA1C >14.0 03/09/2018  ? HGBA1C >14.0 (A) 09/08/2017  ?  ?Congrats on lowering your A1C!!! You met your goal of A1C < 10%!!!! ? ? Today you said your goal is  7-8%. This is an average blood sugar 180-190.  ? ?We talked about checking blood sugar more on one day f the week to enable you to better notice patterns in your blood sugars.  ? ?Follow up in 2-3 months or sooner if needed ? ?Butch Penny ?[phone] ? ?

## 2021-04-30 NOTE — Assessment & Plan Note (Addendum)
Blood pressure remains elevated in 180/105.  Reports adherence to valsartan 80 mg daily. ? ?- Add amlodipine 10 mg ?- Increase valsartan to 160 mg. ?- Repeat BMP ?- If blood pressure remains elevated after 2 medications, consider work-up for secondary hypertension ? ?Addendum ?BMP unremarkable ?

## 2021-04-30 NOTE — Progress Notes (Signed)
? ?  CC: Follow-up on blood pressure ? ?HPI: ? ?[name] is a 46 y.o. with past medical history of hypertension, uncontrolled diabetes who present today for follow-up on his blood pressure. ? ?Please see problem based charting for detail ? ?Past Medical History:  ?Diagnosis Date  ? Chest pain   ? Hyperlipidemia   ? Lesion of penis   ? foreskin  ? OSA (obstructive sleep apnea)   ? per pt dx osa and used cpap but after losing wt. from 415 pounds down to 244 pounds no longer needs cpap  ? Peripheral neuropathy   ? Phimosis   ? Scrotal abscess 05/08/2017  ? Type 2 diabetes mellitus ([place])   ? per pt no meds for two years pt was changed to levemir unable to afford but pt states is waiting on approval for a program he applied for that will pay for his meds [facility])    ? ?Review of Systems:  per HPI ? ?Physical Exam: ? ?Vitals:  ? 04/30/21 1002  ?BP: (!) 184/105  ?Pulse: 77  ?Temp: 98.1 ?F (36.7 ?C)  ?TempSrc: Oral  ?SpO2: 100%  ?Weight: 248 lb 4.8 oz (112.6 kg)  ?Height: 6' (1.829 m)  ? ?Physical Exam ?Constitutional:   ?   General: He is not in acute distress. ?HENT:  ?   Head: Normocephalic.  ?Eyes:  ?   General:     ?   Right eye: No discharge.     ?   Left eye: No discharge.  ?   Conjunctiva/sclera: Conjunctivae normal.  ?Cardiovascular:  ?   Rate and Rhythm: Normal rate and regular rhythm.  ?   Heart sounds: Normal heart sounds.  ?Pulmonary:  ?   Effort: Pulmonary effort is normal. No respiratory distress.  ?   Breath sounds: Normal breath sounds.  ?Genitourinary: ?   Comments: Digital rectal exam performed.  Prostate smooth to palpation.  No nodules palpated.  Prostate nontender to palpation.  No external hemorrhoids observed.  Stool is brown. ?Musculoskeletal:     ?   General: Normal range of motion.  ?Skin: ?   General: Skin is warm.  ?Neurological:  ?   General: No focal deficit present.  ?   Mental Status: He is alert.  ?Psychiatric:     ?   Mood and Affect: Mood normal.  ?  ? ?Assessment &  Plan:  ? ?See Encounters Tab for problem based charting. ? ?Patient discussed with Dr. Heber Wortham  ?

## 2021-04-30 NOTE — Progress Notes (Signed)
?  Medical Nutrition Therapy Via Telemedicine  ?Appt start time: 1115 a.m. end time:  1140 a.m.  ?Total time: 25 min ?Visit # 3  ? ?Assessment:  Primary concerns today: Patient states that he is pleased with his blood sugar control and would like it to be lower. He met his previous goal of a1C < 10% and today stated a new goal is A1C between 7-8%. He stopped using the Continuous glucose monitoring due to expense.  Patient states he is still working on some dietary changes. Patient states he had to quit the second job. He is doing better with taking his medicine consistently. He states that he missed his insulin only 3 times in the past 2 months.   ? ?ANTHROPOMETRICS: he states that he does not want to weight less than 220#.  ?Estimated body mass index is 33.68 kg/m? as calculated from the following: ?  Height as of an earlier encounter on 04/30/21: 6' (1.829 m). ?  Weight as of an earlier encounter on 04/30/21: 248 lb 4.8 oz (112.6 kg).  ?Wt Readings from Last 10 Encounters:  ?04/30/21 248 lb 4.8 oz (112.6 kg)  ?03/18/21 245 lb 4.8 oz (111.3 kg)  ?02/18/21 242 lb 3.2 oz (109.9 kg)  ?02/03/21 252 lb 6.4 oz (114.5 kg)  ?07/01/20 253 lb 6.4 oz (114.9 kg)  ?06/18/20 247 lb (112 kg)  ?06/10/20 245 lb (111.1 kg)  ?09/08/19 230 lb (104.3 kg)  ?03/28/18 250 lb 11.2 oz (113.7 kg)  ?03/24/18 247 lb 2.2 oz (112.1 kg)  ?  ?WEIGHT HISTORY: Highest: 485# Lowest: 242#  ?SLEEP: need to assess at future visit  ?MEDICATIONS: levemir and jardiance, Trulicity added today ?BLOOD SUGAR:  ?Did not bring his meter today.  ?Lab Results  ?Component Value Date  ? HGBA1C 9.6 (A) 04/30/2021  ? HGBA1C 13.4 (A) 02/03/2021  ? HGBA1C 10.0 (H) 06/18/2020  ? HGBA1C >14.0 03/09/2018  ? HGBA1C >14.0 (A) 09/08/2017  ?  ?Dietary intake: Not done in detail per patients request ? ?Usual physical activity: none due to work schedule ? ? ?Progress Towards Goal(s):  Some progress. ?  ?Nutritional Diagnosis:  ?Knowledge deficit as related to unclear understanding of  blood glucose and sensor glucose is improving as evidenced by patient interest in self-monitoring blood glucose levels.  ?   ?Intervention:  Nutrition education about doing a weekly profile using self monitoring of blood glucose. Advised on how to use Trulicity pena dn side effects as well as target ranges for blood sugars to achieve his new a1C goals.  ? ?Action Goal: Monitor blood glucose levels, take Trulicity as directed.  ?Outcome goal: Regulate blood glucose levels.  ?Coordination of care: none today ? ?Teaching Method Utilized: Visual, Auditory,Hands on ?Handouts given during visit include:Continuous glucose monitoring and information ? ?Barriers to learning/adherence to lifestyle change: none ?Demonstrated degree of understanding via:  Teach Back  ? ?Monitoring/Evaluation:  Dietary intake, exercise, blood glucose levels, and body weight in 2-3 months ?  ?Debera Lat, RD ?04/30/2021 ?11:42 AM. ? ?

## 2021-05-01 LAB — URINALYSIS, ROUTINE W REFLEX MICROSCOPIC
Bilirubin, UA: NEGATIVE
Ketones, UA: NEGATIVE
Leukocytes,UA: NEGATIVE
Nitrite, UA: NEGATIVE
Specific Gravity, UA: >=1.03 — AB (ref 1.005–1.030)
Urobilinogen, Ur: 0.2 mg/dL (ref 0.2–1.0)
pH, UA: 5.5 (ref 5.0–7.5)

## 2021-05-01 LAB — MICROSCOPIC EXAMINATION
Bacteria, UA: NONE SEEN
Casts: NONE SEEN /lpf
Epithelial Cells (non renal): NONE SEEN /hpf (ref 0–10)

## 2021-05-02 ENCOUNTER — Telehealth: Payer: Self-pay | Admitting: Cardiology

## 2021-05-02 LAB — BMP8+ANION GAP
Anion Gap: 15 mmol/L (ref 10.0–18.0)
BUN/Creatinine Ratio: 14 (ref 9–20)
BUN: 17 mg/dL (ref 6–24)
CO2: 26 mmol/L (ref 20–29)
Calcium: 8.9 mg/dL (ref 8.7–10.2)
Chloride: 100 mmol/L (ref 96–106)
Creatinine, Ser: 1.18 mg/dL (ref 0.76–1.27)
Glucose: 194 mg/dL — ABNORMAL HIGH (ref 70–99)
Potassium: 4 mmol/L (ref 3.5–5.2)
Sodium: 141 mmol/L (ref 134–144)
eGFR: 79 mL/min/{1.73_m2} (ref >59)

## 2021-05-02 LAB — LIPID PANEL
Chol/HDL Ratio: 8.4 ratio — ABNORMAL HIGH (ref 0.0–5.0)
Cholesterol, Total: 244 mg/dL — ABNORMAL HIGH (ref 100–199)
HDL: 29 mg/dL — ABNORMAL LOW (ref >39)
LDL Chol Calc (NIH): 174 mg/dL — ABNORMAL HIGH (ref 0–99)
Triglycerides: 217 mg/dL — ABNORMAL HIGH (ref 0–149)
VLDL Cholesterol Cal: 41 mg/dL — ABNORMAL HIGH (ref 5–40)

## 2021-05-02 MED ORDER — ROSUVASTATIN CALCIUM 10 MG PO TABS: 10.0000 mg | ORAL_TABLET | Freq: Every day | ORAL | 2 refills | Status: DC

## 2021-05-02 NOTE — Telephone Encounter (Signed)
Patient returning call.

## 2021-05-02 NOTE — Telephone Encounter (Signed)
[name], LPN  ?01/29/863  7:84 AM EDT Back to Top  ?  ?Spoke with the pt and he reports he is not on crestor and doesn't have a prescription for that. ?Pt states he will need this called into his pharmacy. ?Informed him that I will ask Dr. Johney Frame what dose she would like him on and call him back shortly thereafter. ?Pt verbalized understanding and agrees with this plan.  ? ? ? ?Spoke with Dr. Johney Frame about what dose of crestor she would like for this pt to start taking.  ? ?Verbal order given for the pt to start crestor 10 mg po daily. Confirmed the pharmacy of choice with the pt. ?Pt verbalized understanding and agrees with this plan. ?

## 2021-05-02 NOTE — Telephone Encounter (Signed)
-----   Message from Freada Bergeron, MD sent at 05/02/2021  8:00 AM EDT ----- ?His cholesterol looks like it has gone up. Has he started taking the crestor? ?

## 2021-05-06 DIAGNOSIS — R3129 Other microscopic hematuria: Secondary | ICD-10-CM | POA: Insufficient documentation

## 2021-05-06 NOTE — Addendum Note (Signed)
Addended byGaylan Gerold on: 05/06/2021 11:25 AM ? ? Modules accepted: Orders ? ?

## 2021-05-06 NOTE — Assessment & Plan Note (Signed)
Patient was taking statin in the past for primary prevention.  Repeat lipid panel showed elevated cholesterol.  He was started on 10 mg of Crestor daily per Dr. Johney Frame. ?

## 2021-05-06 NOTE — Assessment & Plan Note (Signed)
UA showed microscopic hematuria,.  Patient did have consistent hematuria in the past.  He does have history of smoking cigarettes and cigars. ? ?-Obtain CT hematuria work-up ?

## 2021-05-13 NOTE — Progress Notes (Signed)
Internal Medicine Clinic Attending  Case discussed with Dr. Nguyen  At the time of the visit.  We reviewed the resident's history and exam and pertinent patient test results.  I agree with the assessment, diagnosis, and plan of care documented in the resident's note. 

## 2021-05-21 ENCOUNTER — Ambulatory Visit (INDEPENDENT_AMBULATORY_CARE_PROVIDER_SITE_OTHER): Payer: 59 | Admitting: Student

## 2021-05-21 ENCOUNTER — Other Ambulatory Visit: Payer: Self-pay

## 2021-05-21 ENCOUNTER — Encounter: Payer: Self-pay | Admitting: Student

## 2021-05-21 VITALS — BP 153/91 | HR 75 | Temp 97.9°F | Ht 72.0 in | Wt 245.7 lb

## 2021-05-21 DIAGNOSIS — E1169 Type 2 diabetes mellitus with other specified complication: Secondary | ICD-10-CM | POA: Diagnosis not present

## 2021-05-21 DIAGNOSIS — I10 Essential (primary) hypertension: Secondary | ICD-10-CM | POA: Diagnosis not present

## 2021-05-21 DIAGNOSIS — E1139 Type 2 diabetes mellitus with other diabetic ophthalmic complication: Secondary | ICD-10-CM

## 2021-05-21 DIAGNOSIS — Z794 Long term (current) use of insulin: Secondary | ICD-10-CM

## 2021-05-21 DIAGNOSIS — F1290 Cannabis use, unspecified, uncomplicated: Secondary | ICD-10-CM

## 2021-05-21 DIAGNOSIS — E785 Hyperlipidemia, unspecified: Secondary | ICD-10-CM

## 2021-05-21 MED ORDER — GLIPIZIDE 5 MG PO TABS: 5.0000 mg | ORAL_TABLET | Freq: Every day | ORAL | 2 refills | Status: DC

## 2021-05-21 MED ORDER — EMPAGLIFLOZIN 25 MG PO TABS: 25.0000 mg | ORAL_TABLET | Freq: Every day | ORAL | 2 refills | Status: AC

## 2021-05-21 MED ORDER — EMPAGLIFLOZIN 25 MG PO TABS: 25.0000 mg | ORAL_TABLET | Freq: Every day | ORAL | 2 refills | Status: DC

## 2021-05-21 NOTE — Assessment & Plan Note (Addendum)
A1c of 9.6 4 weeks ago. ?He is currently taking Levemir 15 units daily, Jardiance 10 mg daily and Trulicity 7.00 mg weekly. ?Reports that he cannot tolerate Trulicity side effects of GI upset and belching. ?He does not want to stick his finger daily.  Has not check his blood sugar in the last few weeks.  States that he cannot afford freestyle libre due to price increase. ?Patient cannot tolerate metformin due to side effect. ? ?-I counseled patient on the importance of CBG monitoring while on insulin.  He said that he does not wish to check his blood sugar.  We will stop Levemir to avoid hypoglycemia events. ?-Start glipizide 5 mg daily ?-Increase Jardiance to 25 mg daily ?-Holding Trulicity due to GI side effects. ?-A1c in 3 months ?

## 2021-05-21 NOTE — Assessment & Plan Note (Signed)
Reports smoking a large amount of marijuana daily.  Also smokes cigars at night. ? ?I counseled patient on the risk of smoking including lung cancer and lung disease. ? ?Patient is not motivated to quit at this time. ?

## 2021-05-21 NOTE — Assessment & Plan Note (Addendum)
10 years ASCVD of 47%.  He was started on Crestor 10 mg but has stopped taking due to side effects of cold sweat and numbness.  Said that the side effects went away immediately after stopped taking Crestor. ? ?Patient report strong genetics of cardiovascular disease.  Michela Pitcher that his father had a heart attack in the 58s and also has diabetes and hypertension.  States that his mom has heart stent placed as well. ? ?We had a discussion about the importance of lowering cholesterol to avoid a cardiovascular event.  He is not very concerned about this.  I offer PCSK9 as an alternative if he cannot tolerate a statin.  He said that he will need to research more about this medication before starting. ?

## 2021-05-21 NOTE — Assessment & Plan Note (Addendum)
Initial blood pressure 187/98.  Recheck 153/91 after appropriate relaxation. ?Patient reports taking valsartan 160 mg but has stopped amlodipine due to medication confusion. ? ?Patient report history of sleep apnea on CPAP about 10 years ago.  After his weight loss, he no longer has sleep apnea symptoms.  STOP-BANG score of 3 indicating intermediate risk.  Patient declined repeat sleep study. ? ?He continues to smoke marijuana and cigars.  He is not motivated to quit.  Counseled on the risk ? ?-Resume amlodipine 10 mg ?-Continue valsartan 160 mg ? ?

## 2021-05-21 NOTE — Patient Instructions (Addendum)
Shaun Cummings, ? ?It was nice seeing you in the clinic today. ? ?1.  Please start taking the amlodipine 10 mg daily together with valsartan.  Please let us know if you having symptoms of lightheadedness, dizziness or weakness. ? ?2.  I will stop your Levemir insulin and start glipizide 5 mg daily.  Please take it 30 minutes before breakfast.  ? ?I also increase the Jardiance to 25 mg. ? ?You can hold Trulicity for now. ? ?3.  You may benefit from a medication called PCSK9 inhibitor to lower your cholesterol. ? ?Please return in 3 months for A1c recheck ? ?Dr. Alfonse Spruce ?

## 2021-05-21 NOTE — Progress Notes (Addendum)
? ?CC: Follow-up on blood pressure ? ?HPI: ? ?[name] is a 46 y.o. with past medical history of hypertension, type 2 diabetes, hyperlipidemia who presents to the clinic for blood pressure recheck. ? ?Please see problem based charting for detail ? ?Past Medical History:  ?Diagnosis Date  ? Chest pain   ? Hyperlipidemia   ? Lesion of penis   ? foreskin  ? OSA (obstructive sleep apnea)   ? per pt dx osa and used cpap but after losing wt. from 415 pounds down to 244 pounds no longer needs cpap  ? Peripheral neuropathy   ? Phimosis   ? Scrotal abscess 05/08/2017  ? Type 2 diabetes mellitus ([place])   ? per pt no meds for two years pt was changed to levemir unable to afford but pt states is waiting on approval for a program he applied for that will pay for his meds [facility])    ? ?Review of Systems:  per HPI ? ?Physical Exam: ? ?Vitals:  ? 05/21/21 1050 05/21/21 1132  ?BP: (!) 187/98 (!) 153/91  ?Pulse: 80 75  ?Temp: 97.9 ?F (36.6 ?C)   ?TempSrc: Oral   ?SpO2: 98%   ?Weight: 245 lb 11.2 oz (111.4 kg)   ?Height: 6' (1.829 m)   ? ?Physical Exam ?Constitutional:   ?   General: He is not in acute distress. ?   Appearance: He is not ill-appearing.  ?HENT:  ?   Head: Normocephalic.  ?Eyes:  ?   General:     ?   Right eye: No discharge.     ?   Left eye: No discharge.  ?   Conjunctiva/sclera: Conjunctivae normal.  ?Cardiovascular:  ?   Rate and Rhythm: Normal rate and regular rhythm.  ?Pulmonary:  ?   Effort: Pulmonary effort is normal. No respiratory distress.  ?   Breath sounds: Normal breath sounds. No wheezing.  ?Musculoskeletal:     ?   General: Normal range of motion.  ?Skin: ?   General: Skin is warm.  ?Neurological:  ?   General: No focal deficit present.  ?   Mental Status: He is alert.  ?Psychiatric:     ?   Mood and Affect: Mood normal.  ?  ? ?Assessment & Plan:  ? ?See Encounters Tab for problem based charting. ? ?Hypertension ?Initial blood pressure 187/98.  Recheck 153/91 after appropriate  relaxation. ?Patient reports taking valsartan 160 mg but has stopped amlodipine due to medication confusion. ? ?Patient report history of sleep apnea on CPAP about 10 years ago.  After his weight loss, he no longer has sleep apnea symptoms.  STOP-BANG score of 3 indicating intermediate risk.  Patient declined repeat sleep study. ? ?He continues to smoke marijuana and cigars.  He is not motivated to quit.  Counseled on the risk ? ?-Resume amlodipine 10 mg ?-Continue valsartan 160 mg ? ? ?Diabetes (Shaun Cummings) ?A1c of 9.6 4 weeks ago. ?He is currently taking Levemir 15 units daily, Jardiance 10 mg daily and Trulicity 3.74 mg weekly. ?Reports that he cannot tolerate Trulicity side effects of GI upset and belching. ?He does not want to stick his finger daily.  Has not check his blood sugar in the last few weeks.  States that he cannot afford freestyle libre due to price increase. ?Patient cannot tolerate metformin due to side effect. ? ?-I counseled patient on the importance of CBG monitoring while on insulin.  He said that he does not wish to check  his blood sugar.  We will stop Levemir to avoid hypoglycemia events. ?-Start glipizide 5 mg daily ?-Increase Jardiance to 25 mg daily ?-Holding Trulicity due to GI side effects. ?-A1c in 3 months ? ?Marijuana use, continuous ?Reports smoking a large amount of marijuana daily.  Also smokes cigars at night. ? ?I counseled patient on the risk of smoking including lung cancer and lung disease. ? ?Patient is not motivated to quit at this time. ? ?Hyperlipidemia associated with type 2 diabetes mellitus (Shaun Cummings) ?10 years ASCVD of 47%.  He was started on Crestor 10 mg but has stopped taking due to side effects of cold sweat and numbness.  Said that the side effects went away immediately after stopped taking Crestor. ? ?Patient report strong genetics of cardiovascular disease.  Michela Pitcher that his father had a heart attack in the 68s and also has diabetes and hypertension.  States that his mom has  heart stent placed as well. ? ?We had a discussion about the importance of lowering cholesterol to avoid a cardiovascular event.  He is not very concerned about this.  I offer PCSK9 as an alternative if he cannot tolerate a statin.  He said that he will need to research more about this medication before starting.  ? ?Patient discussed with Dr. Jimmye Norman  ?

## 2021-05-22 ENCOUNTER — Encounter: Payer: Self-pay | Admitting: Gastroenterology

## 2021-05-22 NOTE — Progress Notes (Signed)
Internal Medicine Clinic Attending  Case discussed with Dr. Nguyen  At the time of the visit.  We reviewed the resident's history and exam and pertinent patient test results.  I agree with the assessment, diagnosis, and plan of care documented in the resident's note. 

## 2021-06-10 ENCOUNTER — Telehealth: Payer: Self-pay | Admitting: Gastroenterology

## 2021-06-10 ENCOUNTER — Other Ambulatory Visit: Payer: Self-pay | Admitting: Student

## 2021-06-10 ENCOUNTER — Encounter (HOSPITAL_COMMUNITY): Payer: Self-pay

## 2021-06-10 ENCOUNTER — Telehealth: Payer: Self-pay | Admitting: *Deleted

## 2021-06-10 ENCOUNTER — Ambulatory Visit (HOSPITAL_COMMUNITY)
Admission: RE | Admit: 2021-06-10 | Discharge: 2021-06-10 | Disposition: A | Discharge status: Home / Self Care | Payer: 59 | Source: Ambulatory Visit | Attending: Internal Medicine | Admitting: Internal Medicine

## 2021-06-10 DIAGNOSIS — R3129 Other microscopic hematuria: Secondary | ICD-10-CM

## 2021-06-10 MED ORDER — DIPHENHYDRAMINE HCL 50 MG PO TABS: 50.0000 mg | ORAL_TABLET | Freq: Once | ORAL | 0 refills | Status: DC

## 2021-06-10 MED ORDER — PREDNISONE 50 MG PO TABS: ORAL_TABLET | ORAL | 0 refills | Status: DC

## 2021-06-10 NOTE — Telephone Encounter (Signed)
Called pt and informed him that Dr. Ardis Hughs does not need his CT results prior to his appt. Pt will keep appt as scheduled. Pt verbalized understanding and had no concerns at the end of the call. ?

## 2021-06-10 NOTE — Telephone Encounter (Signed)
Patient her states CT will not do scheduled CT for patient today as he has an allergy to the Contrast.  CT cancelled  Dr. Lisabeth Devoid consulted .  Will reschedule patient for CT and give instructions for doing the Contrast..  Shaun Cummings to reschedule CT for patient. ?

## 2021-06-10 NOTE — Telephone Encounter (Signed)
This patient is scheduled to see Dr. Ardis Hughs on Monday, 5/22, for a new patient appointment.  His PCP ordered a CT hematuria work up to be done prior to this appointment.  Patient is allergic to the dye used, so they did not do the CT today and rescheduled it for Thursday.  Patient states he cannot miss two days of work in the same week as he doesn't get paid if he doesn't work.  His question is whether or not Dr. Ardis Hughs needs this CT for the upcoming appointment.  If so, he will need to reschedule with Dr. Ardis Hughs, or if not, he will see him on Monday as scheduled.  Please advise.  Thank you. ?

## 2021-06-11 NOTE — Telephone Encounter (Signed)
Call to patient  states that he spoke to Dr. Ardis Hughs who told patient that if he needs the CT his office would order if test is needed. CT ordered by [facility] doctor has been cancelled.   Has an appointment to see Dr. Ardis Hughs. ?

## 2021-06-12 ENCOUNTER — Ambulatory Visit (HOSPITAL_COMMUNITY): Payer: 59

## 2021-06-16 ENCOUNTER — Encounter: Payer: Self-pay | Admitting: Gastroenterology

## 2021-06-16 ENCOUNTER — Ambulatory Visit: Payer: 59 | Admitting: Internal Medicine

## 2021-06-16 ENCOUNTER — Ambulatory Visit (INDEPENDENT_AMBULATORY_CARE_PROVIDER_SITE_OTHER): Payer: 59 | Admitting: Gastroenterology

## 2021-06-16 VITALS — BP 164/86 | HR 79 | Ht 70.5 in | Wt 245.4 lb

## 2021-06-16 DIAGNOSIS — R1013 Epigastric pain: Secondary | ICD-10-CM

## 2021-06-16 NOTE — Progress Notes (Signed)
HPI: This is a very pleasant 47 year old man who was referred to me by Iona Beard, MD  to evaluate abdominal discomforts, gassiness.    Chief complaint is epigastric discomfort, bloating, gassiness, diarrhea.  These started a month or so ago  He has poorly controlled diabetes, hemoglobin A1c January 2023 was 13.  Hemoglobin A1c 04/2021 was 9.6.  Trulicity most common side effects are nausea, diarrhea, vomiting, abdominal pain, appetite decreased, dyspepsia.  He started Trulicity a month or so before his symptoms came on.  He stopped the Trulicity 2 weeks ago and has been significantly much better since stopping that medicine.  He cut his Jardiance back a bit as well and the new abdominal symptoms have completely totally resolved.  He still does have very mild lower abdominal discomforts at times.  These are chronic, at least 10 years.   Review of systems: Pertinent positive and negative review of systems were noted in the above HPI section. All other review negative.   Past Medical History:  Diagnosis Date   Chest pain    HTN (hypertension)    Hyperlipidemia    Lesion of penis    foreskin   OSA (obstructive sleep apnea)    per pt dx osa and used cpap but after losing wt. from 415 pounds down to 244 pounds no longer needs cpap   Peripheral neuropathy    Phimosis    Scrotal abscess 05/08/2017   Type 2 diabetes mellitus ([place])    per pt no meds for two years pt was changed to levemir unable to afford but pt states is waiting on approval for a program he applied for that will pay for his meds ([place])      Past Surgical History:  Procedure Laterality Date   CIRCUMCISION N/A 02/10/2016   Procedure: CIRCUMCISION ADULT;  Surgeon: Cleon Gustin, MD;  Location: [facility];  Service: Urology;  Laterality: N/A;   INCISION AND DRAINAGE ABSCESS Right 05/12/2017   Procedure: INCISION AND DRAINAGE RIGHT INGUINAL ABSCESS;  Surgeon: Erroll Luna, MD;   Location: [place];  Service: General;  Laterality: Right;   INCISION AND DRAINAGE ABSCESS N/A 03/24/2018   Procedure: INCISION AND DRAINAGE POSTERIOR NECK ABSCESS;  Surgeon: Excell Seltzer, MD;  Location: [place];  Service: General;  Laterality: N/A;   INCISION AND DRAINAGE ABSCESS Right 06/19/2020   Procedure: INCISION AND DRAINAGE ABSCESS, RIGHT GROIN;  Surgeon: Johnathan Hausen, MD;  Location: [facility];  Service: General;  Laterality: Right;   INCISION AND DRAINAGE PERIRECTAL ABSCESS Right 05/09/2017   Procedure: IRRIGATION AND DEBRIDEMENT PERINEAL ABSCESS;  Surgeon: Donnie Mesa, MD;  Location: [place];  Service: General;  Laterality: Right;    Current Outpatient Medications  Medication Instructions   amLODipine (NORVASC) 10 mg, Oral, Daily   empagliflozin (JARDIANCE) 25 mg, Oral, Daily before breakfast   glipiZIDE (GLUCOTROL) 5 mg, Oral, Daily before breakfast   valsartan (DIOVAN) 160 mg, Oral, Daily    Allergies as of 06/16/2021 - Review Complete 06/16/2021  Allergen Reaction Noted   Augmentin [amoxicillin-pot clavulanate] Hives and Itching 09/12/2013   Contrast media [iodinated contrast media] Hives 03/20/2018    Family History  Problem Relation Age of Onset   Diabetes Mother    Angina Mother    CAD Mother    Glaucoma Father    Diabetes Father    Heart attack Father    Kidney failure Father    Diabetes Sister    Diabetes Brother    Glaucoma Maternal  Grandmother    Diabetes Maternal Grandmother    Glaucoma Maternal Grandfather    Diabetes Maternal Grandfather    Glaucoma Paternal Grandmother    Diabetes Paternal Grandmother    Glaucoma Paternal Grandfather    Diabetes Paternal Grandfather     Social History   Socioeconomic History   Marital status: Married    Spouse name: Not on file   Number of children: 2   Years of education: college    Highest education level: Not on file  Occupational History   Occupation: [employer]   Tobacco Use   Smoking status:  Former    Years: 2.00    Types: Cigarettes    Quit date: 02/05/2008    Years since quitting: 13.3   Smokeless tobacco: Never  Vaping Use   Vaping Use: Never used  Substance and Sexual Activity   Alcohol use: No   Drug use: No   Sexual activity: Not on file  Other Topics Concern   Not on file  Social History Narrative   Lives with wife and 2 kids, age 60 and 50.    Social Determinants of Health   Financial Resource Strain: Not on file  Food Insecurity: Not on file  Transportation Needs: Not on file  Physical Activity: Not on file  Stress: Not on file  Social Connections: Not on file  Intimate Partner Violence: Not on file     Physical Exam: BP (!) 164/86   Ht 5' 10.5" (1.791 m)   Wt 245 lb 6 oz (111.3 kg)   BMI 34.71 kg/m  Constitutional: generally well-appearing Psychiatric: alert and oriented x3 Eyes: extraocular movements intact Mouth: oral pharynx moist, no lesions Neck: supple no lymphadenopathy Cardiovascular: heart regular rate and rhythm Lungs: clear to auscultation bilaterally Abdomen: soft, nontender, nondistended, no obvious ascites, no peritoneal signs, normal bowel sounds Extremities: no lower extremity edema bilaterally Skin: no lesions on visible extremities   Assessment and plan: 46 y.o. male with significant GI side effects from diabetes medicines  Trulicity is well known to cause a variety of bothersome GI side effects.  Since he stopped taking that medicine his symptoms have greatly greatly improved.  He then cut back his Jardiance by one half and the symptoms completely resolved.  I do not think he needs any testing at this point from a GI perspective.  Seems pretty clear-cut that he was having side effects from diabetes medicines.  He will return to see me in 3 months time and we will discuss his other, chronic lower GI discomforts.  Hopefully those will resolve as well.  If not he might need some type of work-up.   Please see the "Patient  Instructions" section for addition details about the plan.   Owens Loffler, MD [facility] 06/16/2021, 11:28 AM  Cc: Iona Beard, MD  Total time on date of encounter was 40  minutes (this included time spent preparing to see the patient reviewing records; obtaining and/or reviewing separately obtained history; performing a medically appropriate exam and/or evaluation; counseling and educating the patient and family if present; ordering medications, tests or procedures if applicable; and documenting clinical information in the health record).

## 2021-06-16 NOTE — Patient Instructions (Signed)
If you are age 44 or younger, your body mass index should be between 19-25. Your Body mass index is 34.71 kg/m. If this is out of the aformentioned range listed, please consider follow up with your Primary Care Provider.  ________________________________________________________  The [place] [facility] providers would like to encourage you to use [facility] to communicate with providers for non-urgent requests or questions.  Due to long hold times on the telephone, sending your provider a message by [facility] may be a faster and more efficient way to get a response.  Please allow 48 business hours for a response.  Please remember that this is for non-urgent requests.  _______________________________________________________  Shaun Cummings will need a follow up appointment in 3 months (August 2023).  We will contact you to schedule this appointment.  Thank you for entrusting me with your care and choosing [facility].  Dr Ardis Hughs

## 2021-07-09 ENCOUNTER — Encounter: Payer: Self-pay | Admitting: Dietician

## 2021-07-09 ENCOUNTER — Other Ambulatory Visit: Payer: Self-pay | Admitting: Dietician

## 2021-07-09 ENCOUNTER — Ambulatory Visit (INDEPENDENT_AMBULATORY_CARE_PROVIDER_SITE_OTHER): Payer: 59 | Admitting: Dietician

## 2021-07-09 DIAGNOSIS — E1139 Type 2 diabetes mellitus with other diabetic ophthalmic complication: Secondary | ICD-10-CM

## 2021-07-09 DIAGNOSIS — Z794 Long term (current) use of insulin: Secondary | ICD-10-CM | POA: Diagnosis not present

## 2021-07-09 LAB — GLUCOSE, CAPILLARY: Glucose-Capillary: 201 mg/dL — ABNORMAL HIGH (ref 70–99)

## 2021-07-09 IMAGING — CT CT PELVIS W/O CM
2 of 3 series · 17 of 46 positions shown, 19 images · non-contrast
Comparison: None.

CLINICAL DATA: Groin abscess

EXAM:
CT PELVIS WITHOUT CONTRAST
TECHNIQUE: Multidetector CT imaging of the pelvis was performed following the
standard protocol without intravenous contrast.

[Series 2: axial st · axial · 0.96mm/px · z∈[+998,+1378]mm · 14 of 88 slices shown, 16 images]
[im 6/88  soft-tissue]
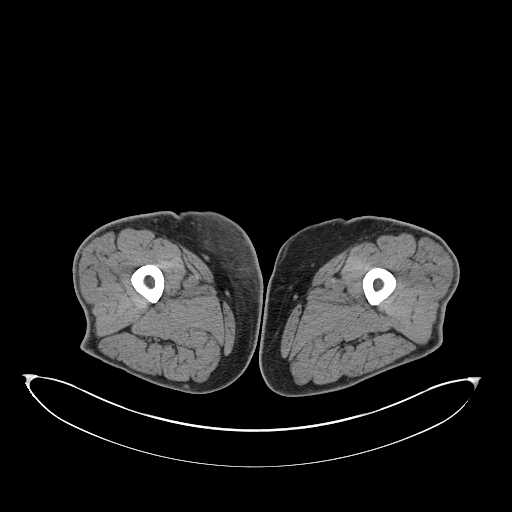
[im 6/88  bone]
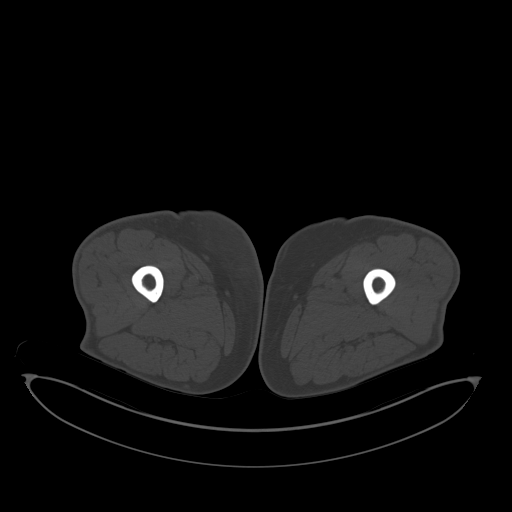
[im 12/88  soft-tissue]
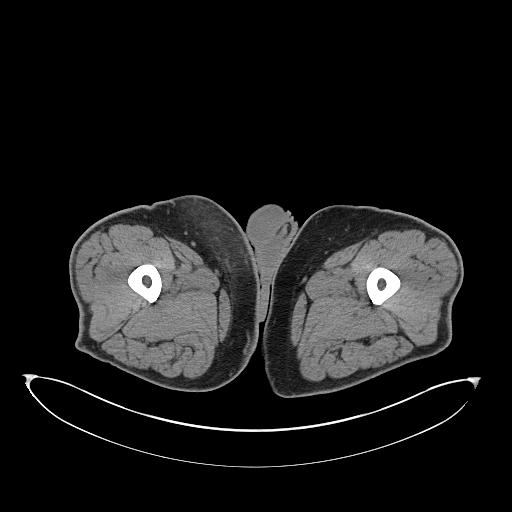
[im 17/88  soft-tissue]
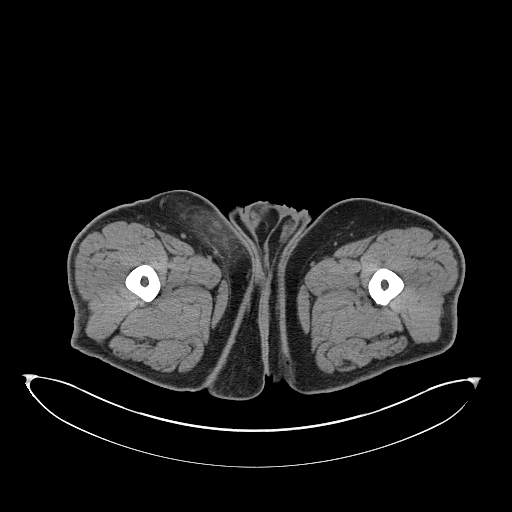
[im 23/88  soft-tissue]
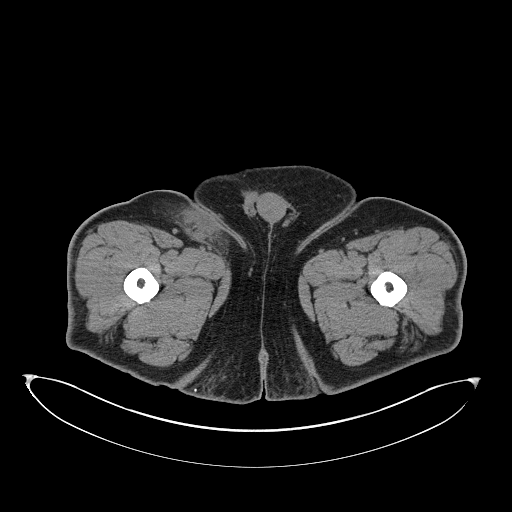
[im 29/88  soft-tissue]
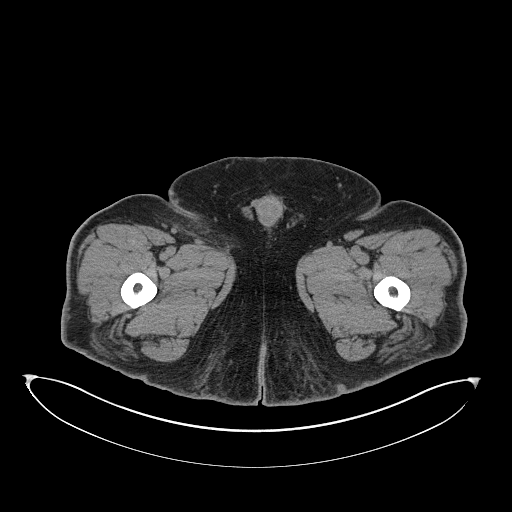
[im 34/88  soft-tissue]
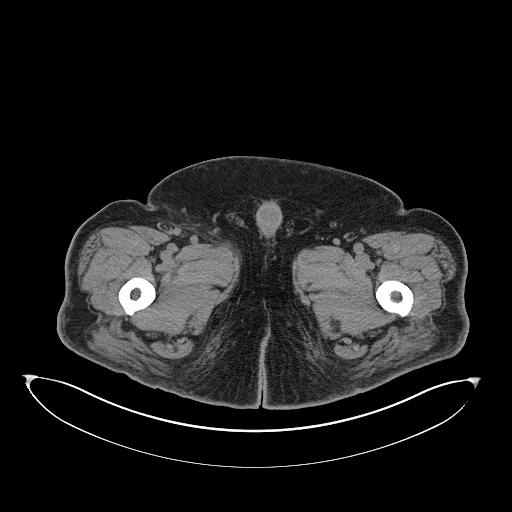
[im 40/88  soft-tissue]
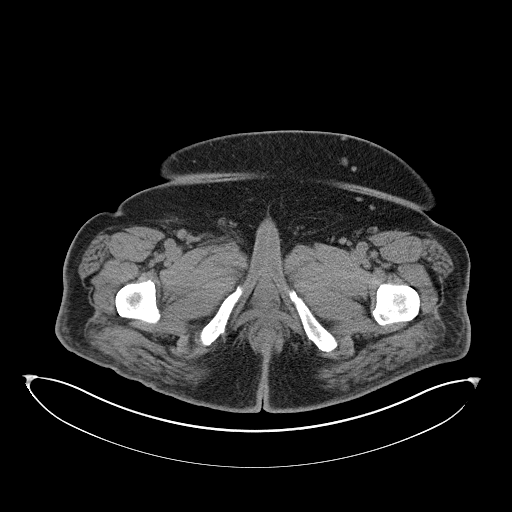
[im 48/88  soft-tissue]
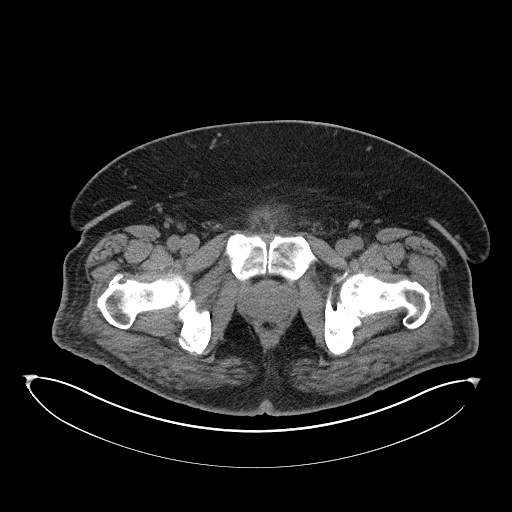
[im 54/88  soft-tissue]
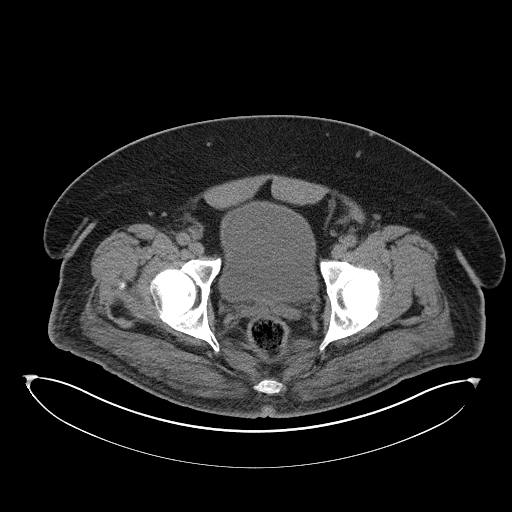
[im 54/88  bone]
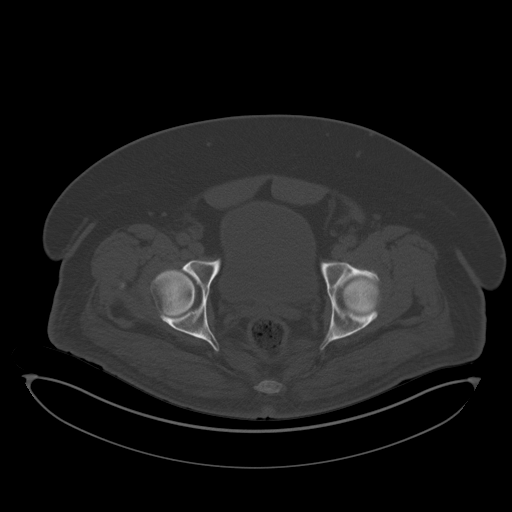
[im 59/88  soft-tissue]
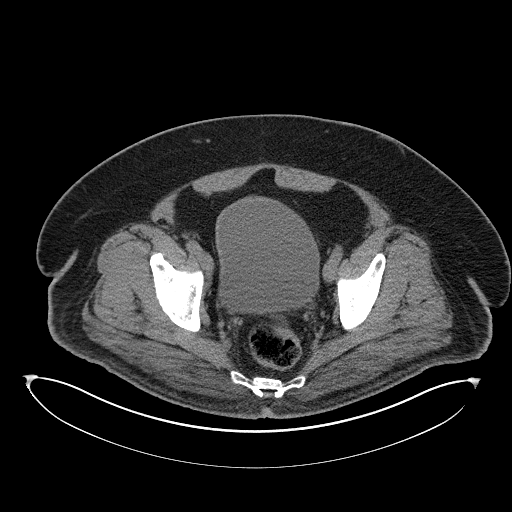
[im 65/88  soft-tissue]
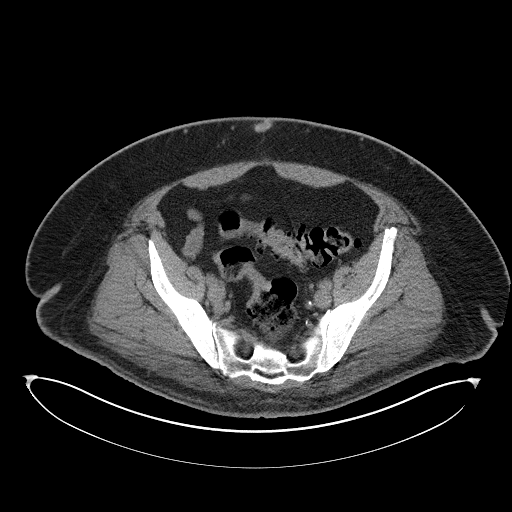
[im 71/88  soft-tissue]
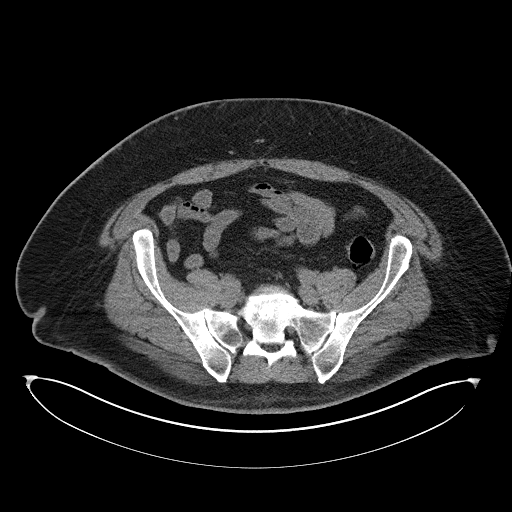
[im 76/88  soft-tissue]
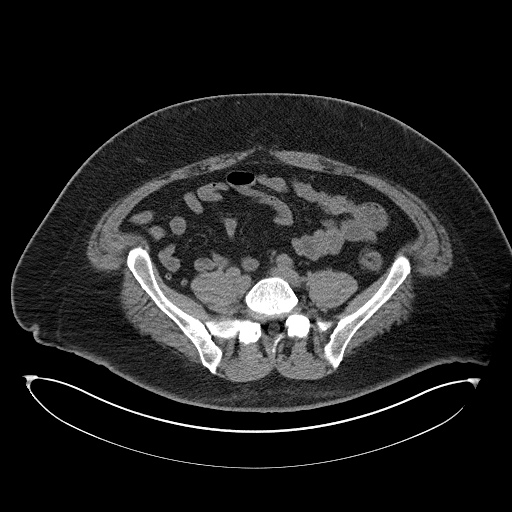
[im 82/88  soft-tissue]
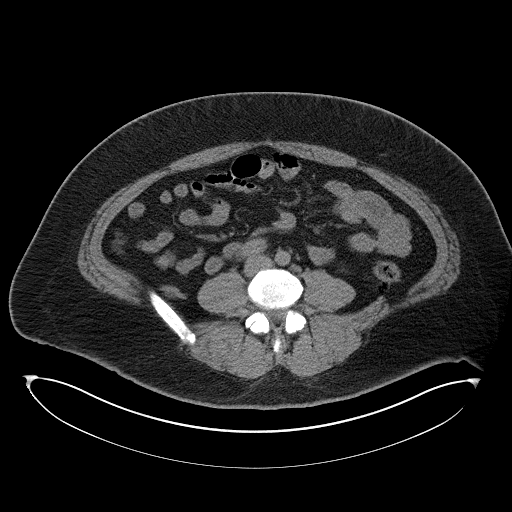

[Series 8: coronal st · coronal · 0.84mm/px · 3 of 169 slices shown]
[im 57/169  soft-tissue]
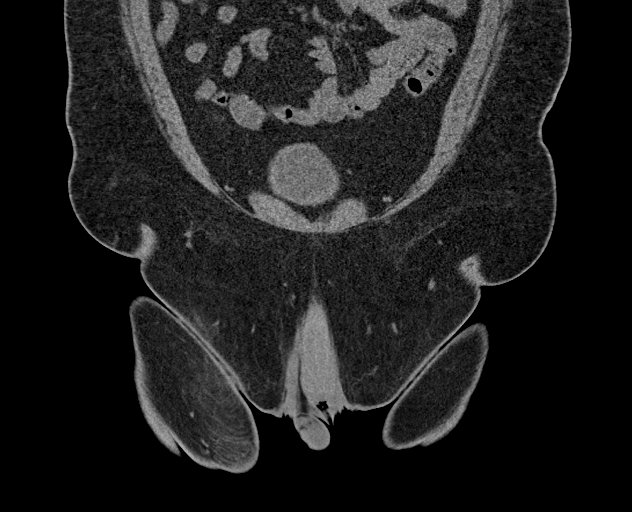
[im 75/169  soft-tissue]
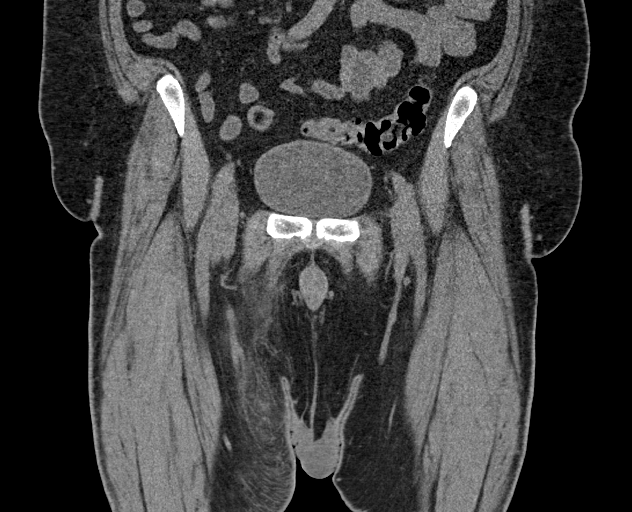
[im 94/169  soft-tissue]
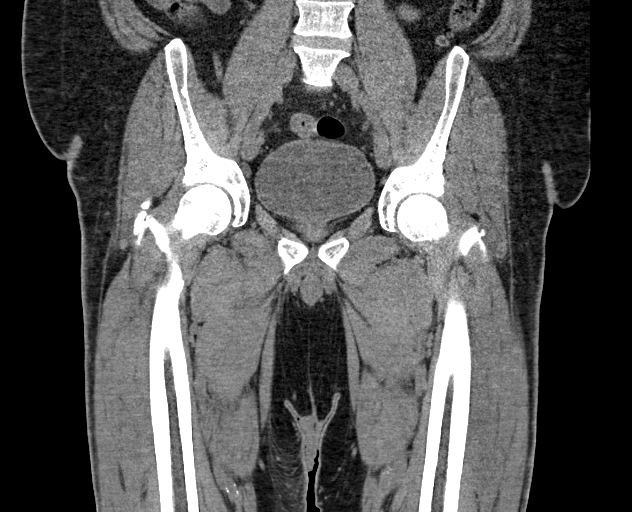

[17 of 46 positions shown; findings below may reference images not displayed]

FINDINGS: Urinary Tract:  No abnormality visualized.

Bowel:  Unremarkable visualized pelvic bowel loops.

Vascular/Lymphatic: Calcific atherosclerosis.  No lymphadenopathy.

Reproductive:  Enlarged prostate, 5.5 cm in transverse dimension.

Other: Subcutaneous phlegmon in the anteromedial right thigh
measures 3.3 x 2.3 cm. Mild surrounding inflammatory stranding
extending into the mid right thigh medially. No scrotal abnormality.

Musculoskeletal: Small fat containing ventral abdominal hernia.
Bones are normal.
IMPRESSION: 1. Subcutaneous phlegmon in the anteromedial right thigh, measuring
3.3 x 2.3 cm.

Aortic Atherosclerosis ([id number]).

## 2021-07-09 MED ORDER — CONTOUR NEXT TEST VI STRP: ORAL_STRIP | 12 refills | Status: DC

## 2021-07-09 MED ORDER — MICROLET NEXT LANCING DEVICE MISC: 11 refills | Status: DC

## 2021-07-09 NOTE — Progress Notes (Addendum)
Medical Nutrition Therapy  Appt start time: 1015 a.m. end time:  1100 a.m.  Total time: 45 min Visit #  4  Assessment:  Primary concerns today: Patient  stated a goal is for A1C between 7-8% .  Patient states he is still working on some dietary changes Patient states he stopped going to retinal specialist due to cost and painful procedures. He stopped all of his diabetes medicines 2-3 weeks ago due to swelling in his feet and ankles. He states the swelling is somewhat improved over what it was and it is pitting edema in feet today. He has not been checking his blood sugars and do snot have a meter. His weight is increased, he says his appetite has increased some after stopping Trulicity, but some weight may also be from the swelling.   He was pleasantly surprised that his blood sugar was not higher today. It was 201 2 hours after eating a balanced breakfast.   Visual foot check today showed: Patient has swelling in feet bilaterally which he says is improved from what it was 2 weeks ago. He states the swelling went up to his calves. He also has significant callus formation on the first and fifth metatarsals bilaterally. He was advised how to care for these and to wear shoes with wider toe boxes and to not smoke as well as check feet daily for sores, redness, etc.  ANTHROPOMETRICS: he states that he does not want to weight less than 220#.  Estimated body mass index is 36.33 kg/m as calculated from the following:   Height as of 06/16/21: 5' 10.5" (1.791 m).   Weight as of this encounter: 256 lb 12.8 oz (116.5 kg).   Wt Readings from Last 10 Encounters:  07/09/21 256 lb 12.8 oz (116.5 kg)  06/16/21 245 lb 6 oz (111.3 kg)  05/21/21 245 lb 11.2 oz (111.4 kg)  04/30/21 248 lb 4.8 oz (112.6 kg)  03/18/21 245 lb 4.8 oz (111.3 kg)  02/18/21 242 lb 3.2 oz (109.9 kg)  02/03/21 252 lb 6.4 oz (114.5 kg)  07/01/20 253 lb 6.4 oz (114.9 kg)  06/18/20 247 lb (112 kg)  06/10/20 245 lb (111.1 kg)     SLEEP: need to assess at future visit   Lab Results  Component Value Date   HGBA1C 9.6 (A) 04/30/2021   HGBA1C 13.4 (A) 02/03/2021   HGBA1C 10.0 (H) 06/18/2020   HGBA1C >14.0 03/09/2018   HGBA1C >14.0 (A) 09/08/2017    Dietary intake:  Breakfast: 1-2 eggs, 2 slices whole wheat toast, 1 strip bacon, coffee  Usual physical activity: he reports a lot of sitting   Progress Towards Goal(s):  Some progress.   Nutritional Diagnosis:  Knowledge deficit as related to unclear understanding of blood glucose and diabetes knowledge is improving as evidenced by patient interest in self-monitoring blood glucose levels.     Intervention:  Nutrition education about doing a weekly profile using self monitoring of blood glucose, foot care, diabetes medicines, blood sugar targets for achieving his goal a1c.  Action Goal: Monitor blood glucose levels, take glipizide as directed.  Outcome goal: Regulate blood glucose levels.  Coordination of care: suggest glipizide extended release. Request diabetes testing supply prescritions Teaching Method Utilized: Visual, Auditory,Hands on Handouts given during visit include:Contour meter and lancets and 20 strips  Barriers to learning/adherence to lifestyle change: none Demonstrated degree of understanding via:  Teach Back   Monitoring/Evaluation:  Dietary intake, exercise, blood glucose levels, and body weight in 110months  Butch Penny Robina Hamor, RD 07/09/2021 11:13 AM.

## 2021-07-09 NOTE — Patient Instructions (Addendum)
Restart glipizide/glucotrol.  Please check your blood sugar as we discussed to know what your blood sugar are doing and how the glipizide is working.   Keep in mind that moving lowers blood sugar.   You are going to make a doctor appointment for swelling and questions about medicines.   Please consider purchasing/wearing shoes with wider toeboxes to see if that helps reduce the calluses (that come from pressure)   Butch Penny [phone]    Dr. Sherlynn Stalls is retinal specialist who may be able to help you with your eyes.

## 2021-07-09 NOTE — Telephone Encounter (Signed)
Patient request prescriptions for diabetes testing supplies

## 2021-07-15 ENCOUNTER — Telehealth: Payer: Self-pay | Admitting: Dietician

## 2021-07-15 NOTE — Telephone Encounter (Signed)
Left a message for patient to call the office and schedule an appointment with a doctor for the swelling in his feet per his PCP, Dr. Lisabeth Devoid. Will also send him a letter.

## 2021-07-17 ENCOUNTER — Encounter: Payer: Self-pay | Admitting: Dietician

## 2021-08-26 ENCOUNTER — Encounter: Payer: 59 | Admitting: Student

## 2021-08-26 ENCOUNTER — Encounter: Payer: Self-pay | Admitting: Student

## 2021-09-04 ENCOUNTER — Encounter: Payer: 59 | Admitting: Dietician

## 2021-09-10 ENCOUNTER — Ambulatory Visit: Payer: 59 | Admitting: Gastroenterology

## 2021-11-20 ENCOUNTER — Encounter: Payer: Self-pay | Admitting: *Deleted

## 2021-12-16 ENCOUNTER — Ambulatory Visit (INDEPENDENT_AMBULATORY_CARE_PROVIDER_SITE_OTHER): Payer: 59 | Admitting: Student

## 2021-12-16 VITALS — BP 192/112 | HR 90 | Temp 98.0°F | Ht 70.0 in | Wt 251.0 lb

## 2021-12-16 DIAGNOSIS — R3129 Other microscopic hematuria: Secondary | ICD-10-CM

## 2021-12-16 DIAGNOSIS — Z87891 Personal history of nicotine dependence: Secondary | ICD-10-CM

## 2021-12-16 DIAGNOSIS — I10 Essential (primary) hypertension: Secondary | ICD-10-CM | POA: Diagnosis not present

## 2021-12-16 DIAGNOSIS — E785 Hyperlipidemia, unspecified: Secondary | ICD-10-CM | POA: Diagnosis not present

## 2021-12-16 DIAGNOSIS — Z0000 Encounter for general adult medical examination without abnormal findings: Secondary | ICD-10-CM

## 2021-12-16 DIAGNOSIS — E1139 Type 2 diabetes mellitus with other diabetic ophthalmic complication: Secondary | ICD-10-CM

## 2021-12-16 DIAGNOSIS — K921 Melena: Secondary | ICD-10-CM | POA: Diagnosis not present

## 2021-12-16 DIAGNOSIS — I209 Angina pectoris, unspecified: Secondary | ICD-10-CM | POA: Diagnosis not present

## 2021-12-16 DIAGNOSIS — Z794 Long term (current) use of insulin: Secondary | ICD-10-CM | POA: Diagnosis not present

## 2021-12-16 DIAGNOSIS — E1169 Type 2 diabetes mellitus with other specified complication: Secondary | ICD-10-CM

## 2021-12-16 LAB — POCT GLYCOSYLATED HEMOGLOBIN (HGB A1C): Hemoglobin A1C: 11.5 % — AB (ref 4.0–5.6)

## 2021-12-16 LAB — GLUCOSE, CAPILLARY: Glucose-Capillary: 231 mg/dL — ABNORMAL HIGH (ref 70–99)

## 2021-12-16 MED ORDER — AMLODIPINE BESYLATE 10 MG PO TABS: 10.0000 mg | ORAL_TABLET | Freq: Every day | ORAL | 2 refills | Status: DC

## 2021-12-16 NOTE — Progress Notes (Unsigned)
Established Patient Office Visit  Subjective   Patient ID: Shaun Cummings, male    DOB: 1975-07-08  Age: 46 y.o. MRN: [id number]  Chief Complaint  Patient presents with   Diabetes   Follow-up    Shaun Cummings is a 46 y.o. person living with a history listed below who present to clinic for follow up of DM, HTN, HLD. Please refer to problem based charting for further details and assessment and plan of current problem and chronic medical conditions.     Patient Active Problem List   Diagnosis Date Noted   Diabetic retinopathy ([place]) 12/17/2021   Healthcare maintenance 12/17/2021   Angina pectoris ([place]) 12/17/2021   Marijuana use, continuous 05/21/2021   Microscopic hematuria 05/06/2021   Hematochezia 04/30/2021   Blurred vision 02/04/2021   Abscess of right groin 06/18/2020   Infected epithelial inclusion cyst 03/14/2018   Hypertension 03/09/2018   Peripheral neuropathy 05/21/2017   Hyperlipidemia associated with type 2 diabetes mellitus ([place]) 09/18/2013   Obesity (BMI 30-39.9) 09/18/2013   Diabetes ([place]) 09/15/2013      ROS: negative as per HPI     Objective:     BP (!) 192/112 (BP Location: Right Arm, Patient Position: Sitting, Cuff Size: Small)   Pulse 90   Temp 98 F (36.7 C) (Oral)   Ht 5' 10" (1.778 m)   Wt 251 lb (113.9 kg)   SpO2 98%   BMI 36.01 kg/m  BP Readings from Last 3 Encounters:  12/16/21 (!) 192/112  06/16/21 (!) 164/86  05/21/21 (!) 153/91      Physical Exam Constitutional:      Comments: Chronically ill appearing  HENT:     Head: Normocephalic and atraumatic.     Mouth/Throat:     Mouth: Mucous membranes are moist.     Pharynx: Oropharynx is clear.  Eyes:     Extraocular Movements: Extraocular movements intact.     Pupils: Pupils are equal, round, and reactive to light.  Cardiovascular:     Rate and Rhythm: Normal rate and regular rhythm.  Pulmonary:     Effort: Pulmonary effort is normal.     Breath sounds: No rhonchi or  rales.  Abdominal:     General: Abdomen is flat. Bowel sounds are normal. There is no distension.     Palpations: Abdomen is soft.     Tenderness: There is no abdominal tenderness.  Musculoskeletal:        General: Normal range of motion.     Right lower leg: Edema (2+) present.     Left lower leg: Edema (2+) present.  Skin:    Capillary Refill: Capillary refill takes less than 2 seconds.     Comments: Ecchymosis of the right third toe around nail. No Sings of infection, multiple calloses of feet  Neurological:     General: No focal deficit present.     Mental Status: He is alert and oriented to person, place, and time.  Psychiatric:        Mood and Affect: Mood normal.        Behavior: Behavior normal.     Results for orders placed or performed in visit on 12/16/21  Microscopic Examination   Urine  Result Value Ref Range   WBC, UA 0-5 0 - 5 /hpf   RBC, Urine >30 (A) 0 - 2 /hpf   Epithelial Cells (non renal) 0-10 0 - 10 /hpf   Casts Present (A) None seen /lpf   Cast Type Hyaline casts  N/A   Bacteria, UA None seen None seen/Few  Glucose, capillary  Result Value Ref Range   Glucose-Capillary 231 (H) 70 - 99 mg/dL  Urinalysis, Reflex Microscopic  Result Value Ref Range   Specific Gravity, UA 1.022 1.005 - 1.030   pH, UA 6.5 5.0 - 7.5   Color, UA Yellow Yellow   Appearance Ur Clear Clear   Leukocytes,UA Negative Negative   Protein,UA 4+ (A) Negative/Trace   Glucose, UA 2+ (A) Negative   Ketones, UA Trace (A) Negative   RBC, UA 2+ (A) Negative   Bilirubin, UA Negative Negative   Urobilinogen, Ur 0.2 0.2 - 1.0 mg/dL   Nitrite, UA Negative Negative   Microscopic Examination See below:   Microalbumin / Creatinine Urine Ratio  Result Value Ref Range   Creatinine, Urine WILL FOLLOW    Microalbumin, Urine WILL FOLLOW    Microalb/Creat Ratio WILL FOLLOW   POC Hbg A1C  Result Value Ref Range   Hemoglobin A1C 11.5 (A) 4.0 - 5.6 %   HbA1c POC (<> result, manual entry)      HbA1c, POC (prediabetic range)     HbA1c, POC (controlled diabetic range)      Last metabolic panel Lab Results  Component Value Date   GLUCOSE 194 (H) 04/30/2021   NA 141 04/30/2021   K 4.0 04/30/2021   CL 100 04/30/2021   CO2 26 04/30/2021   BUN 17 04/30/2021   CREATININE 1.18 04/30/2021   EGFR 79 04/30/2021   CALCIUM 8.9 04/30/2021   PROT 7.0 06/10/2020   ALBUMIN 3.5 06/10/2020   BILITOT 0.4 06/10/2020   ALKPHOS 73 06/10/2020   AST 15 06/10/2020   ALT 14 06/10/2020   ANIONGAP 8 06/19/2020   Last hemoglobin A1c Lab Results  Component Value Date   HGBA1C 11.5 (A) 12/16/2021      The 10-year ASCVD risk score (Arnett DK, et al., 2019) is: 21.9%    Assessment & Plan:   Problem List Items Addressed This Visit       Cardiovascular and Mediastinum   Hypertension    Blood pressure very elevated today 214/114 initially and 192/112 on repeat.  He has stopped taking all of his blood pressure since his last visit.  He noted having night sweats while taking the valsartan.  This.  Night sweats have resolved.  He does not recall being told to restart his amlodipine.  He is very resistant to starting new medications.  Discussed the risks of uncontrolled hypertension.  Patient reports he understands the risks and is agreeable to restarting amlodipine 10 mg daily  -restart amlodipine 10 mg daily -Follow-up blood pressure in 2 weeks.      Relevant Medications   amLODipine (NORVASC) 10 MG tablet   Angina pectoris (HCC)    Reports 1 episodes of CP radiating to under the left arm after walking up a large hill with son. No further CP since then. Notes LE edema bilaterally with R leg pain that improved after he stopped smoking 2 days ago.   - check echo -needs to follow up with cardiology      Relevant Medications   amLODipine (NORVASC) 10 MG tablet     Endocrine   Diabetes ([place]) - Primary    A1c elevated 11.5% from 9.6%. Stop taking Trulicity and Jardiance due to GI effects.   He saw GI in May 2023 to discuss GI discomfort, bloating, and diarrhea.  This was attributed to to Trulicity and has now resolved. States he  also stopped Jardiance at that time. Still tolerating glipizide not taking it regularly . Insulin stopped due to noncompliance with fingerstick's and concern for hypoglycemia. Checking fasting glucoses 2-3 times a week. CBG ranging between 190-210.  Also kicked the door last week and now has bruising under the third toe nail. No signs of infection. He is not having pain. Discussed the importance of good glycemic control and that he is at increased risk of severe infection, losing vision, kidney damage, and vision. He understands but does not want to restart jardiance. Or do daily cbg checks. Made goal to take glipizide regularly as this is the only medication he feels he can tolerate.  -restart glipizide 5 mg daily - he will make an appointment to the diabetic educator,  - check urine microalbumin/cr -follow up in 1-2 weeks, he will bring his meter to next visit.       Relevant Orders   POC Hbg A1C (Completed)   Ambulatory referral to Ophthalmology   Microalbumin / Creatinine Urine Ratio (Completed)   Hyperlipidemia associated with type 2 diabetes mellitus ([place])    Was on rosuvastatin. Cannot remember why he stopped taking this. Has strong family history and many risk factors for ASCVD. He would reconsider restarting statin if he tolerated hypertension medication but does not want to start more than 1 medication at a time. Will discuss this further at next visit.       Relevant Medications   amLODipine (NORVASC) 10 MG tablet     Genitourinary   Microscopic hematuria    No gross hematuria, says he is allergic to contrast and they could not do CT hematuria study. Will repeat UA today. Will arrange for pretreatment for contrast reaction. Will need referral to urology after he gets CT.      Relevant Orders   Urinalysis, Reflex Microscopic (Completed)      Other   Hematochezia    Saw Gi in May. Saw Dr. Ardis Hughs no GI procedures recommended regarding hematochezia, unclear if patient actually discussed with is GI. Has not had any more episodes of hematochezia. Will continue to monitor and discuss colonoscopy at age 88.       Healthcare maintenance    Declined influenza vaccine today.       Return in about 2 weeks (around 12/30/2021).    Iona Beard, MD

## 2021-12-16 NOTE — Patient Instructions (Addendum)
Blood pressure Please start amlodipine 10 mg daily You blood pressure is very high and this puts you a risk of stroke and acute coronary events  Lowering you blood pressure is very important to help reduce the risk   Diabetes  Please increase your glipizide to 10 mg daily You are at risk of losing your vision and limbs from uncontrolled sugars Please check your blood sugar in the morning before eating Make a follow up with Butch Penny  Keep an eye on you toenail for signs of redness, drainage, pain, warmth and swelling and follow up if this occurs I will make a new referral to ophthalmology   Leg pain and swelling I will order an echocardiogram to evaluate you heart Please also follow up with your cardiologist  We will check urine studies today and I will call with results  Follow up in 1-2 weeks

## 2021-12-17 ENCOUNTER — Encounter: Payer: Self-pay | Admitting: Student

## 2021-12-17 DIAGNOSIS — I209 Angina pectoris, unspecified: Secondary | ICD-10-CM | POA: Insufficient documentation

## 2021-12-17 DIAGNOSIS — E11319 Type 2 diabetes mellitus with unspecified diabetic retinopathy without macular edema: Secondary | ICD-10-CM | POA: Insufficient documentation

## 2021-12-17 DIAGNOSIS — Z0000 Encounter for general adult medical examination without abnormal findings: Secondary | ICD-10-CM | POA: Insufficient documentation

## 2021-12-17 HISTORY — DX: Angina pectoris, unspecified: I20.9

## 2021-12-17 LAB — MICROSCOPIC EXAMINATION
Bacteria, UA: NONE SEEN
RBC, Urine: >30 /hpf — AB (ref 0–2)

## 2021-12-17 LAB — URINALYSIS, ROUTINE W REFLEX MICROSCOPIC
Bilirubin, UA: NEGATIVE
Leukocytes,UA: NEGATIVE
Nitrite, UA: NEGATIVE
Specific Gravity, UA: 1.022 (ref 1.005–1.030)
Urobilinogen, Ur: 0.2 mg/dL (ref 0.2–1.0)
pH, UA: 6.5 (ref 5.0–7.5)

## 2021-12-17 LAB — MICROALBUMIN / CREATININE URINE RATIO
Creatinine, Urine: 67.8 mg/dL
Microalb/Creat Ratio: 10120 mg/g creat — ABNORMAL HIGH (ref 0–29)
Microalbumin, Urine: 6861.4 ug/mL

## 2021-12-17 NOTE — Assessment & Plan Note (Signed)
Was seeing Dr. Shanon Rosser for diabetic retinopathy and glaucoma. Was getting laser treatment and feel unhappy with the amount of pain the procedure caused and that they would not give him glasses. Continues to have vision loss likely due to uncontrolled diabetes and hypertension. Stressed the importance to getting better control of this to preserve his vision and follow up with ophthalmology. He refuses to see Dr. Katy Fitch again and would like a referral to another provider.   -referral to ophthalmology - see details for DM and HTN

## 2021-12-17 NOTE — Assessment & Plan Note (Signed)
Saw Gi in May. Saw Dr. Ardis Hughs no GI procedures recommended regarding hematochezia, unclear if patient actually discussed with is GI. Has not had any more episodes of hematochezia. Will continue to monitor and discuss colonoscopy at age 46.

## 2021-12-17 NOTE — Assessment & Plan Note (Signed)
Was on rosuvastatin. Cannot remember why he stopped taking this. Has strong family history and many risk factors for ASCVD. He would reconsider restarting statin if he tolerated hypertension medication but does not want to start more than 1 medication at a time. Will discuss this further at next visit.

## 2021-12-17 NOTE — Assessment & Plan Note (Signed)
Declined influenza vaccine today. 

## 2021-12-17 NOTE — Assessment & Plan Note (Signed)
A1c elevated 11.5% from 9.6%. Stop taking Trulicity and Jardiance due to GI effects.  He saw GI in May 2023 to discuss GI discomfort, bloating, and diarrhea.  This was attributed to to Trulicity and has now resolved. States he also stopped Jardiance at that time. Still tolerating glipizide not taking it regularly . Insulin stopped due to noncompliance with fingerstick's and concern for hypoglycemia. Checking fasting glucoses 2-3 times a week. CBG ranging between 190-210.  Also kicked the door last week and now has bruising under the third toe nail. No signs of infection. He is not having pain. Discussed the importance of good glycemic control and that he is at increased risk of severe infection, losing vision, kidney damage, and vision. He understands but does not want to restart jardiance. Or do daily cbg checks. Made goal to take glipizide regularly as this is the only medication he feels he can tolerate.  -restart glipizide 5 mg daily - he will make an appointment to the diabetic educator,  - check urine microalbumin/cr -follow up in 1-2 weeks, he will bring his meter to next visit.

## 2021-12-17 NOTE — Progress Notes (Signed)
Internal Medicine Clinic Attending ? ?Case discussed with Dr. Liang  At the time of the visit.  We reviewed the resident?s history and exam and pertinent patient test results.  I agree with the assessment, diagnosis, and plan of care documented in the resident?s note. ? ?

## 2021-12-17 NOTE — Assessment & Plan Note (Signed)
Reports 1 episodes of CP radiating to under the left arm after walking up a large hill with son. No further CP since then. Notes LE edema bilaterally with R leg pain that improved after he stopped smoking 2 days ago.   - check echo -needs to follow up with cardiology

## 2021-12-17 NOTE — Assessment & Plan Note (Signed)
Blood pressure very elevated today 214/114 initially and 192/112 on repeat.  He has stopped taking all of his blood pressure since his last visit.  He noted having night sweats while taking the valsartan.  This.  Night sweats have resolved.  He does not recall being told to restart his amlodipine.  He is very resistant to starting new medications.  Discussed the risks of uncontrolled hypertension.  Patient reports he understands the risks and is agreeable to restarting amlodipine 10 mg daily  -restart amlodipine 10 mg daily -Follow-up blood pressure in 2 weeks.

## 2021-12-17 NOTE — Assessment & Plan Note (Signed)
No gross hematuria, says he is allergic to contrast and they could not do CT hematuria study. Will repeat UA today. Will arrange for pretreatment for contrast reaction. Will need referral to urology after he gets CT.

## 2021-12-22 ENCOUNTER — Other Ambulatory Visit: Payer: Self-pay | Admitting: Student

## 2021-12-22 MED ORDER — PREDNISONE 50 MG PO TABS: 50.0000 mg | ORAL_TABLET | ORAL | 0 refills | Status: DC

## 2021-12-22 MED ORDER — DIPHENHYDRAMINE HCL 50 MG PO TABS: 50.0000 mg | ORAL_TABLET | Freq: Once | ORAL | 0 refills | Status: DC

## 2021-12-30 ENCOUNTER — Encounter: Payer: Self-pay | Admitting: Dietician

## 2021-12-30 ENCOUNTER — Encounter: Payer: Self-pay | Admitting: Student

## 2021-12-30 ENCOUNTER — Ambulatory Visit (INDEPENDENT_AMBULATORY_CARE_PROVIDER_SITE_OTHER): Payer: 59 | Admitting: Dietician

## 2021-12-30 ENCOUNTER — Ambulatory Visit (INDEPENDENT_AMBULATORY_CARE_PROVIDER_SITE_OTHER): Payer: 59 | Admitting: Student

## 2021-12-30 VITALS — BP 167/102 | HR 78 | Wt 247.5 lb

## 2021-12-30 DIAGNOSIS — Z87891 Personal history of nicotine dependence: Secondary | ICD-10-CM | POA: Diagnosis not present

## 2021-12-30 DIAGNOSIS — R600 Localized edema: Secondary | ICD-10-CM

## 2021-12-30 DIAGNOSIS — E1139 Type 2 diabetes mellitus with other diabetic ophthalmic complication: Secondary | ICD-10-CM

## 2021-12-30 DIAGNOSIS — E1169 Type 2 diabetes mellitus with other specified complication: Secondary | ICD-10-CM

## 2021-12-30 DIAGNOSIS — E785 Hyperlipidemia, unspecified: Secondary | ICD-10-CM

## 2021-12-30 DIAGNOSIS — I10 Essential (primary) hypertension: Secondary | ICD-10-CM | POA: Diagnosis not present

## 2021-12-30 DIAGNOSIS — Z794 Long term (current) use of insulin: Secondary | ICD-10-CM | POA: Diagnosis not present

## 2021-12-30 DIAGNOSIS — M7989 Other specified soft tissue disorders: Secondary | ICD-10-CM

## 2021-12-30 DIAGNOSIS — Z7984 Long term (current) use of oral hypoglycemic drugs: Secondary | ICD-10-CM

## 2021-12-30 LAB — GLUCOSE, CAPILLARY: Glucose-Capillary: 162 mg/dL — ABNORMAL HIGH (ref 70–99)

## 2021-12-30 MED ORDER — FREESTYLE LIBRE 3 SENSOR MISC: 1.0000 [IU] | 11 refills | Status: DC

## 2021-12-30 MED ORDER — AMLODIPINE-OLMESARTAN 10-20 MG PO TABS: 1.0000 | ORAL_TABLET | Freq: Every day | ORAL | 2 refills | Status: DC

## 2021-12-30 MED ORDER — EMPAGLIFLOZIN 10 MG PO TABS: 10.0000 mg | ORAL_TABLET | Freq: Every day | ORAL | 3 refills | Status: DC

## 2021-12-30 NOTE — Progress Notes (Signed)
  Medical Nutrition Therapy  Appt start time: 1115 a.m. end time:  1150 a.m.  Total time: 35 min Visit #  5 Last visit 06/2021  Assessment:  Primary concerns today: Patient  stated a goal is for A1C between 7-8% eventually but for now, around 9%.  .  Patient states he is still working on some dietary changes and feels he is doing well with this. He states he could use more physical activity.  He would like to use Continuous glucose monitoring If it is affordable with his new insurance. A sample Dexcom G7 was given to patient today and he started it with his phone here in the office with it being in warm up mode when he left.    He was pleasantly surprised that his blood sugar was not higher today. It was 162 3-4 hours after eating breakfast. Next A1c is 03/18/22 or after.   ANTHROPOMETRICS: he states that he does not want to weight less than 220#. It is stable currently.  Estimated body mass index is 35.51 kg/m as calculated from the following:   Height as of 12/16/21: 5\' 10"  (1.778 m).   Weight as of an earlier encounter on 12/30/21: 247 lb 8 oz (112.3 kg).   Wt Readings from Last 10 Encounters:  12/30/21 247 lb 8 oz (112.3 kg)  12/16/21 251 lb (113.9 kg)  07/09/21 256 lb 12.8 oz (116.5 kg)  06/16/21 245 lb 6 oz (111.3 kg)  05/21/21 245 lb 11.2 oz (111.4 kg)  04/30/21 248 lb 4.8 oz (112.6 kg)  03/18/21 245 lb 4.8 oz (111.3 kg)  02/18/21 242 lb 3.2 oz (109.9 kg)  02/03/21 252 lb 6.4 oz (114.5 kg)  07/01/20 253 lb 6.4 oz (114.9 kg)    SLEEP: need to assess at future visit   Lab Results  Component Value Date   HGBA1C 11.5 (A) 12/16/2021   HGBA1C 9.6 (A) 04/30/2021   HGBA1C 13.4 (A) 02/03/2021   HGBA1C 10.0 (H) 06/18/2020   HGBA1C >14.0 03/09/2018    Dietary intake:  Breakfast: 1-2 eggs, 2 slices whole wheat toast, black coffee with 1 teaspoon sugar  Lunch is largest meal, sometimes out  Dinner- spaghetti homemade Beverages- mostly water, 1 soda a month now  Usual physical  activity: he reports a lot of sitting   Progress Towards Goal(s):  Some progress.   Nutritional Diagnosis:  Knowledge deficit as related to understanding of blood glucose and diabetes knowledge is improving as evidenced by patient interest in self-monitoring blood glucose levels and discussed lifestyle changes to help reduce A1C.      Intervention:  Nutrition education about diabetes medicines, blood sugar targets for achieving his goal a1c.  Action Goal: Monitor blood glucose levels, take glipizide and jardiance as directed.  Outcome goal: Regulate blood glucose levels.  Coordination of care: suggest glipizide extended release. Request diabetes testing supply prescriptions and referral to exercise program Teaching Method Utilized: Visual, Auditory,Hands on Handouts given during visit include:Contour meter and lancets and 20 strips  Barriers to learning/adherence to lifestyle change: none Demonstrated degree of understanding via:  Teach Back   Monitoring/Evaluation:  Dietary intake, exercise, blood glucose levels, and body weight in 2 months   Debera Lat, RD 12/30/2021 1:10 PM.

## 2021-12-30 NOTE — Patient Instructions (Addendum)
Your blood sugar is improving.   Have fun with the Dexcom G7 Continuous glucose monitoring   Have fun with the exercise class if you end up going. I suggest asking about your client going with you.  You should hear form them by phone.    Keep working on your meal plan- decreasing carbohydrates- starches and sugars- like eating  berries for fruit.   Please make a follow up visit in February.    Butch Penny [phone]

## 2021-12-30 NOTE — Patient Instructions (Addendum)
It was a pleasure seeing you in clinic today  Please start amlodipine-olmesartan 10-20 mg daily for blood pressure  Please start Jardiance for diabetes and continue your glipizide Check your blood sugar and bring you meter to your next visit   I recommend you start a cholesterol lowering medication to prevent major events such as stroke and heart attacks  I will sent a referral to the foot doctor for the calluses  We will order a [place] of you right leg to look into the leg swelling   Please follow up in 2 weeks

## 2021-12-31 DIAGNOSIS — R600 Localized edema: Secondary | ICD-10-CM | POA: Insufficient documentation

## 2021-12-31 HISTORY — DX: Localized edema: R60.0

## 2021-12-31 NOTE — Progress Notes (Signed)
Established Patient Office Visit  Subjective   Patient ID: Shaun Cummings, male    DOB: 1975-05-19  Age: 46 y.o. MRN: [id number]  Chief Complaint  Patient presents with   Hypertension   Foot Pain    Shaun Cummings is a 46 y.o. person living with a history listed below who presents to clinic for diabetes and hypertension follow up. Please refer to problem based charting for further details and assessment and plan of current problem and chronic medical conditions.     Patient Active Problem List   Diagnosis Date Noted   Leg edema, right 12/31/2021   Diabetic retinopathy (Shaun Cummings) 12/17/2021   Healthcare maintenance 12/17/2021   Angina pectoris ([place]) 12/17/2021   Marijuana use, continuous 05/21/2021   Microscopic hematuria 05/06/2021   Hematochezia 04/30/2021   Blurred vision 02/04/2021   Infected epithelial inclusion cyst 03/14/2018   Hypertension 03/09/2018   Peripheral neuropathy 05/21/2017   Hyperlipidemia associated with type 2 diabetes mellitus (Shaun Cummings) 09/18/2013   Obesity (BMI 30-39.9) 09/18/2013   Diabetes (Shaun Cummings) 09/15/2013      ROS: negative as per HPI    Objective:     BP (!) 167/102 (BP Location: Right Arm, Patient Position: Supine, Cuff Size: Large)   Pulse 78   Wt 247 lb 8 oz (112.3 kg)   SpO2 100%   BMI 35.51 kg/m  BP Readings from Last 3 Encounters:  12/30/21 (!) 167/102  12/16/21 (!) 192/112  06/16/21 (!) 164/86      Physical Exam Constitutional:      General: He is not in acute distress. HENT:     Mouth/Throat:     Mouth: Mucous membranes are moist.     Pharynx: Oropharynx is clear.  Cardiovascular:     Rate and Rhythm: Normal rate and regular rhythm.     Heart sounds: No murmur heard.    Comments: Diminished DP and PT pulses bilaterally Pulmonary:     Effort: Pulmonary effort is normal.     Breath sounds: No rhonchi or rales.  Abdominal:     General: Abdomen is flat. Bowel sounds are normal. There is no distension.     Palpations: Abdomen  is soft.     Tenderness: There is no abdominal tenderness.  Musculoskeletal:        General: Normal range of motion.     Comments: RLE edema 2, no pain with palpation, no ereythema  Skin:    General: Skin is warm and dry.     Capillary Refill: Capillary refill takes less than 2 seconds.     Comments: calluses of the bilateral fore foot, bruising of the right third toe nail seem improved from last visit. No obvious wounds.   Neurological:     General: No focal deficit present.     Mental Status: He is alert and oriented to person, place, and time.  Psychiatric:        Mood and Affect: Mood normal.        Behavior: Behavior normal.     Results for orders placed or performed in visit on 12/30/21  Glucose, capillary  Result Value Ref Range   Glucose-Capillary 162 (H) 70 - 99 mg/dL    Last hemoglobin A1c Lab Results  Component Value Date   HGBA1C 11.5 (A) 12/16/2021      The 10-year ASCVD risk score (Arnett DK, et al., 2019) is: 17.5%    Assessment & Plan:   Problem List Items Addressed This Visit  Cardiovascular and Mediastinum   Hypertension    Bp remains elevated. He is feeling well with no acute complaints. Is taking amlodipine 5 mg daily as discussed. He is open to restarting an ARB which I think would be beneficial given his albuminuria. Will started on amlodipine-olmesartan 10-20mg  to reduce pill burden. Follow up in 2 weeks for BP recheck.       Relevant Medications   amlodipine-olmesartan (AZOR) 10-20 MG tablet     Endocrine   Diabetes (HCC) - Primary    Glucose of 162 today. States fast blood sugar generally below 180 since last visit. He did not bring his meter in today. Has visit with Shaun Cummings today. Would like to discuss restarting CGM. Poor sensation on monofilament testing today.  Multiple pre-ulcerative callus on both feet. He has been using a razor to trim this at home and thinks he may have nicked himself. This seems to have healed without signs of  infection. Discussed avoiding this in the future and will send him to podiatry to help with this. Instructed to check feet daily. Seems motivated to improve his glycemic control and would be willing to restart SGLT-2 after we discussed it was likely the GLP1 that was causing his GI symptoms.   -Continue glipizide 5 mg daily - Start jardiance 10 mg daily - referral to podiatry - follow up with Shaun Cummings       Relevant Medications   amlodipine-olmesartan (AZOR) 10-20 MG tablet   empagliflozin (JARDIANCE) 10 MG TABS tablet   Other Relevant Orders   Ambulatory referral to Podiatry   Amb Referral To Provider Referral Exercise Program (P.R.E.P)   Hyperlipidemia associated with type 2 diabetes mellitus ([place])    Offered statin again given his elevated ASCVD risk. Patient understand but defers as he does not want to take so many medications. Will continue discussing with him.       Relevant Medications   amlodipine-olmesartan (AZOR) 10-20 MG tablet   empagliflozin (JARDIANCE) 10 MG TABS tablet     Other   Leg edema, right    Right lower extremity edema for the last several week. Reports some pain in lateral leg that resolved after he stopped smoking about 2 weeks ago however he continue to have swelling. States he did have some pain with walking but not anymore. Diminished distal pulses in both feet. ABI's check in clinic were normal. Will order DVT study on the right leg.       Other Visit Diagnoses     Right leg swelling       Relevant Orders   VAS [place] LOWER EXTREMITY VENOUS (DVT)   POCT ABI Screening Pilot No Charge (Completed)       Return in about 2 weeks (around 01/13/2022).    Shaun Beard, MD

## 2021-12-31 NOTE — Assessment & Plan Note (Signed)
Right lower extremity edema for the last several week. Reports some pain in lateral leg that resolved after he stopped smoking about 2 weeks ago however he continue to have swelling. States he did have some pain with walking but not anymore. Diminished distal pulses in both feet. ABI's check in clinic were normal. Will order DVT study on the right leg.

## 2021-12-31 NOTE — Assessment & Plan Note (Signed)
Offered statin again given his elevated ASCVD risk. Patient understand but defers as he does not want to take so many medications. Will continue discussing with him.

## 2021-12-31 NOTE — Assessment & Plan Note (Signed)
Glucose of 162 today. States fast blood sugar generally below 180 since last visit. He did not bring his meter in today. Has visit with Butch Penny today. Would like to discuss restarting CGM. Poor sensation on monofilament testing today.  Multiple pre-ulcerative callus on both feet. He has been using a razor to trim this at home and thinks he may have nicked himself. This seems to have healed without signs of infection. Discussed avoiding this in the future and will send him to podiatry to help with this. Instructed to check feet daily. Seems motivated to improve his glycemic control and would be willing to restart SGLT-2 after we discussed it was likely the GLP1 that was causing his GI symptoms.   -Continue glipizide 5 mg daily - Start jardiance 10 mg daily - referral to podiatry - follow up with Butch Penny

## 2021-12-31 NOTE — Assessment & Plan Note (Signed)
Bp remains elevated. He is feeling well with no acute complaints. Is taking amlodipine 5 mg daily as discussed. He is open to restarting an ARB which I think would be beneficial given his albuminuria. Will started on amlodipine-olmesartan 10-20mg  to reduce pill burden. Follow up in 2 weeks for BP recheck.

## 2022-01-01 ENCOUNTER — Telehealth: Payer: Self-pay

## 2022-01-01 NOTE — Telephone Encounter (Signed)
Prior Authorization for patient Shaun Cummings) came through on cover my meds was submitted with last office and labs awaiting approval or denial

## 2022-01-01 NOTE — Telephone Encounter (Signed)
Decision:Approved [employer] (Key[employer]ard Hamper) Rx #: [id number] Jardiance 10MG  tablets   Form [employer] Electronic PA Form [phone] NCPDP) Created COVID-19 Clinical Trial [employer]PPORT[employer] A clinical trial for COVID-19 is looking for volunteers in your area. Ensure this clinical trial is representative of communities with the highest risk of COVID-19.  Message from plan: Your PA request has been approved. Additional information will be provided in the approval communication. (Message [phone])

## 2022-01-01 NOTE — Progress Notes (Signed)
Internal Medicine Clinic Attending ? ?Case discussed with Dr. Liang  At the time of the visit.  We reviewed the resident?s history and exam and pertinent patient test results.  I agree with the assessment, diagnosis, and plan of care documented in the resident?s note. ? ?

## 2022-01-02 ENCOUNTER — Inpatient Hospital Stay (HOSPITAL_BASED_OUTPATIENT_CLINIC_OR_DEPARTMENT_OTHER)
Admission: RE | Admit: 2022-01-02 | Discharge: 2022-01-02 | Disposition: A | Discharge status: Home / Self Care | Payer: 59 | Source: Ambulatory Visit | Attending: Student in an Organized Health Care Education/Training Program | Admitting: Student in an Organized Health Care Education/Training Program

## 2022-01-02 ENCOUNTER — Telehealth: Payer: Self-pay | Admitting: *Deleted

## 2022-01-02 DIAGNOSIS — N179 Acute kidney failure, unspecified: Secondary | ICD-10-CM | POA: Diagnosis not present

## 2022-01-02 DIAGNOSIS — M7989 Other specified soft tissue disorders: Secondary | ICD-10-CM

## 2022-01-02 DIAGNOSIS — I161 Hypertensive emergency: Secondary | ICD-10-CM | POA: Diagnosis not present

## 2022-01-02 NOTE — Telephone Encounter (Signed)
Contacted regarding PREP Class referral. Left voice message to return call for more information. 

## 2022-01-04 ENCOUNTER — Other Ambulatory Visit: Payer: Self-pay

## 2022-01-04 ENCOUNTER — Inpatient Hospital Stay (HOSPITAL_COMMUNITY)
Admission: EM | Admit: 2022-01-04 | Discharge: 2022-01-16 | DRG: 321 | Disposition: A | Discharge status: Home / Self Care | Payer: 59 | Attending: Internal Medicine | Admitting: Internal Medicine

## 2022-01-04 ENCOUNTER — Emergency Department (HOSPITAL_COMMUNITY): Payer: 59

## 2022-01-04 DIAGNOSIS — K219 Gastro-esophageal reflux disease without esophagitis: Secondary | ICD-10-CM | POA: Diagnosis present

## 2022-01-04 DIAGNOSIS — I10 Essential (primary) hypertension: Secondary | ICD-10-CM | POA: Diagnosis not present

## 2022-01-04 DIAGNOSIS — I5023 Acute on chronic systolic (congestive) heart failure: Secondary | ICD-10-CM | POA: Diagnosis present

## 2022-01-04 DIAGNOSIS — Z79899 Other long term (current) drug therapy: Secondary | ICD-10-CM

## 2022-01-04 DIAGNOSIS — N186 End stage renal disease: Secondary | ICD-10-CM | POA: Diagnosis not present

## 2022-01-04 DIAGNOSIS — N471 Phimosis: Secondary | ICD-10-CM | POA: Diagnosis present

## 2022-01-04 DIAGNOSIS — K429 Umbilical hernia without obstruction or gangrene: Secondary | ICD-10-CM | POA: Diagnosis not present

## 2022-01-04 DIAGNOSIS — Z825 Family history of asthma and other chronic lower respiratory diseases: Secondary | ICD-10-CM

## 2022-01-04 DIAGNOSIS — G4733 Obstructive sleep apnea (adult) (pediatric): Secondary | ICD-10-CM | POA: Diagnosis present

## 2022-01-04 DIAGNOSIS — I25110 Atherosclerotic heart disease of native coronary artery with unstable angina pectoris: Secondary | ICD-10-CM | POA: Diagnosis not present

## 2022-01-04 DIAGNOSIS — Z951 Presence of aortocoronary bypass graft: Secondary | ICD-10-CM | POA: Diagnosis not present

## 2022-01-04 DIAGNOSIS — I700 Atherosclerosis of aorta: Secondary | ICD-10-CM | POA: Diagnosis present

## 2022-01-04 DIAGNOSIS — E1122 Type 2 diabetes mellitus with diabetic chronic kidney disease: Secondary | ICD-10-CM | POA: Diagnosis not present

## 2022-01-04 DIAGNOSIS — R109 Unspecified abdominal pain: Secondary | ICD-10-CM

## 2022-01-04 DIAGNOSIS — I719 Aortic aneurysm of unspecified site, without rupture: Secondary | ICD-10-CM | POA: Diagnosis not present

## 2022-01-04 DIAGNOSIS — R7989 Other specified abnormal findings of blood chemistry: Secondary | ICD-10-CM

## 2022-01-04 DIAGNOSIS — E1165 Type 2 diabetes mellitus with hyperglycemia: Secondary | ICD-10-CM | POA: Diagnosis present

## 2022-01-04 DIAGNOSIS — N200 Calculus of kidney: Secondary | ICD-10-CM | POA: Diagnosis not present

## 2022-01-04 DIAGNOSIS — I132 Hypertensive heart and chronic kidney disease with heart failure and with stage 5 chronic kidney disease, or end stage renal disease: Secondary | ICD-10-CM | POA: Diagnosis not present

## 2022-01-04 DIAGNOSIS — Z833 Family history of diabetes mellitus: Secondary | ICD-10-CM

## 2022-01-04 DIAGNOSIS — I2510 Atherosclerotic heart disease of native coronary artery without angina pectoris: Secondary | ICD-10-CM | POA: Diagnosis not present

## 2022-01-04 DIAGNOSIS — Z87891 Personal history of nicotine dependence: Secondary | ICD-10-CM

## 2022-01-04 DIAGNOSIS — R040 Epistaxis: Secondary | ICD-10-CM | POA: Diagnosis present

## 2022-01-04 DIAGNOSIS — N1411 Contrast-induced nephropathy: Secondary | ICD-10-CM | POA: Diagnosis not present

## 2022-01-04 DIAGNOSIS — Z6836 Body mass index (BMI) 36.0-36.9, adult: Secondary | ICD-10-CM

## 2022-01-04 DIAGNOSIS — Z955 Presence of coronary angioplasty implant and graft: Secondary | ICD-10-CM

## 2022-01-04 DIAGNOSIS — R079 Chest pain, unspecified: Secondary | ICD-10-CM | POA: Diagnosis not present

## 2022-01-04 DIAGNOSIS — I161 Hypertensive emergency: Secondary | ICD-10-CM

## 2022-01-04 DIAGNOSIS — I513 Intracardiac thrombosis, not elsewhere classified: Secondary | ICD-10-CM | POA: Diagnosis present

## 2022-01-04 DIAGNOSIS — Z880 Allergy status to penicillin: Secondary | ICD-10-CM

## 2022-01-04 DIAGNOSIS — E876 Hypokalemia: Secondary | ICD-10-CM | POA: Diagnosis not present

## 2022-01-04 DIAGNOSIS — E872 Acidosis, unspecified: Secondary | ICD-10-CM | POA: Diagnosis present

## 2022-01-04 DIAGNOSIS — I5021 Acute systolic (congestive) heart failure: Secondary | ICD-10-CM | POA: Diagnosis not present

## 2022-01-04 DIAGNOSIS — I255 Ischemic cardiomyopathy: Secondary | ICD-10-CM | POA: Diagnosis not present

## 2022-01-04 DIAGNOSIS — T508X5A Adverse effect of diagnostic agents, initial encounter: Secondary | ICD-10-CM | POA: Diagnosis not present

## 2022-01-04 DIAGNOSIS — N170 Acute kidney failure with tubular necrosis: Secondary | ICD-10-CM | POA: Diagnosis not present

## 2022-01-04 DIAGNOSIS — I214 Non-ST elevation (NSTEMI) myocardial infarction: Secondary | ICD-10-CM | POA: Diagnosis not present

## 2022-01-04 DIAGNOSIS — N1832 Chronic kidney disease, stage 3b: Secondary | ICD-10-CM | POA: Diagnosis not present

## 2022-01-04 DIAGNOSIS — I7143 Infrarenal abdominal aortic aneurysm, without rupture: Secondary | ICD-10-CM | POA: Diagnosis present

## 2022-01-04 DIAGNOSIS — E871 Hypo-osmolality and hyponatremia: Secondary | ICD-10-CM | POA: Diagnosis present

## 2022-01-04 DIAGNOSIS — I739 Peripheral vascular disease, unspecified: Secondary | ICD-10-CM | POA: Diagnosis not present

## 2022-01-04 DIAGNOSIS — Z01810 Encounter for preprocedural cardiovascular examination: Secondary | ICD-10-CM | POA: Diagnosis not present

## 2022-01-04 DIAGNOSIS — Z91041 Radiographic dye allergy status: Secondary | ICD-10-CM

## 2022-01-04 DIAGNOSIS — R1033 Periumbilical pain: Secondary | ICD-10-CM | POA: Diagnosis not present

## 2022-01-04 DIAGNOSIS — J9811 Atelectasis: Secondary | ICD-10-CM | POA: Diagnosis not present

## 2022-01-04 DIAGNOSIS — Z8249 Family history of ischemic heart disease and other diseases of the circulatory system: Secondary | ICD-10-CM

## 2022-01-04 DIAGNOSIS — Z841 Family history of disorders of kidney and ureter: Secondary | ICD-10-CM

## 2022-01-04 DIAGNOSIS — M47816 Spondylosis without myelopathy or radiculopathy, lumbar region: Secondary | ICD-10-CM | POA: Diagnosis not present

## 2022-01-04 DIAGNOSIS — K5730 Diverticulosis of large intestine without perforation or abscess without bleeding: Secondary | ICD-10-CM | POA: Diagnosis not present

## 2022-01-04 DIAGNOSIS — H35039 Hypertensive retinopathy, unspecified eye: Secondary | ICD-10-CM | POA: Diagnosis present

## 2022-01-04 DIAGNOSIS — E1142 Type 2 diabetes mellitus with diabetic polyneuropathy: Secondary | ICD-10-CM | POA: Diagnosis not present

## 2022-01-04 DIAGNOSIS — E785 Hyperlipidemia, unspecified: Secondary | ICD-10-CM | POA: Diagnosis not present

## 2022-01-04 DIAGNOSIS — I7409 Other arterial embolism and thrombosis of abdominal aorta: Secondary | ICD-10-CM | POA: Diagnosis not present

## 2022-01-04 DIAGNOSIS — N179 Acute kidney failure, unspecified: Secondary | ICD-10-CM | POA: Diagnosis not present

## 2022-01-04 DIAGNOSIS — Z83511 Family history of glaucoma: Secondary | ICD-10-CM

## 2022-01-04 DIAGNOSIS — Z7984 Long term (current) use of oral hypoglycemic drugs: Secondary | ICD-10-CM

## 2022-01-04 DIAGNOSIS — N183 Chronic kidney disease, stage 3 unspecified: Secondary | ICD-10-CM | POA: Diagnosis not present

## 2022-01-04 DIAGNOSIS — R739 Hyperglycemia, unspecified: Secondary | ICD-10-CM | POA: Diagnosis not present

## 2022-01-04 DIAGNOSIS — R1030 Lower abdominal pain, unspecified: Secondary | ICD-10-CM | POA: Diagnosis not present

## 2022-01-04 HISTORY — DX: Hypertensive emergency: I16.1

## 2022-01-04 HISTORY — DX: Other specified abnormal findings of blood chemistry: R79.89

## 2022-01-04 LAB — TROPONIN I (HIGH SENSITIVITY)
Troponin I (High Sensitivity): 19 ng/L — ABNORMAL HIGH (ref <18)
Troponin I (High Sensitivity): 22 ng/L — ABNORMAL HIGH (ref <18)

## 2022-01-04 LAB — COMPREHENSIVE METABOLIC PANEL
ALT: 14 U/L (ref 0–44)
AST: 17 U/L (ref 15–41)
Albumin: 3.1 g/dL — ABNORMAL LOW (ref 3.5–5.0)
Alkaline Phosphatase: 81 U/L (ref 38–126)
Anion gap: 14 (ref 5–15)
BUN: 24 mg/dL — ABNORMAL HIGH (ref 6–20)
CO2: 22 mmol/L (ref 22–32)
Calcium: 9 mg/dL (ref 8.9–10.3)
Chloride: 102 mmol/L (ref 98–111)
Creatinine, Ser: 1.9 mg/dL — ABNORMAL HIGH (ref 0.61–1.24)
GFR, Estimated: 44 mL/min — ABNORMAL LOW (ref >60)
Glucose, Bld: 242 mg/dL — ABNORMAL HIGH (ref 70–99)
Potassium: 3.4 mmol/L — ABNORMAL LOW (ref 3.5–5.1)
Sodium: 138 mmol/L (ref 135–145)
Total Bilirubin: 0.3 mg/dL (ref 0.3–1.2)
Total Protein: 7.4 g/dL (ref 6.5–8.1)

## 2022-01-04 LAB — CBC
HCT: 38.8 % — ABNORMAL LOW (ref 39.0–52.0)
Hemoglobin: 13.5 g/dL (ref 13.0–17.0)
MCH: 29.5 pg (ref 26.0–34.0)
MCHC: 34.8 g/dL (ref 30.0–36.0)
MCV: 84.9 fL (ref 80.0–100.0)
Platelets: 426 10*3/uL — ABNORMAL HIGH (ref 150–400)
RBC: 4.57 MIL/uL (ref 4.22–5.81)
RDW: 12.6 % (ref 11.5–15.5)
WBC: 17.4 10*3/uL — ABNORMAL HIGH (ref 4.0–10.5)
nRBC: 0 % (ref 0.0–0.2)

## 2022-01-04 LAB — LIPASE, BLOOD: Lipase: 32 U/L (ref 11–51)

## 2022-01-04 MED ORDER — NALOXONE HCL 0.4 MG/ML IJ SOLN: 0.4000 mg | INTRAMUSCULAR | Status: DC | PRN

## 2022-01-04 MED ORDER — CLEVIDIPINE BUTYRATE 0.5 MG/ML IV EMUL
0.0000 mg/h | INTRAVENOUS | Status: DC
  Administered 2022-01-05: 1 mg/h via INTRAVENOUS
  Filled 2022-01-04: qty 100
  Filled 2022-01-04: qty 50

## 2022-01-04 MED ORDER — SODIUM CHLORIDE 0.9 % IV SOLN: 25.0000 mg | INTRAVENOUS | Status: DC | PRN

## 2022-01-04 MED ORDER — DIPHENHYDRAMINE HCL 50 MG/ML IJ SOLN
50.0000 mg | Freq: Once | INTRAMUSCULAR | Status: AC
  Administered 2022-01-04: 50 mg via INTRAVENOUS
  Filled 2022-01-04: qty 1

## 2022-01-04 MED ORDER — METHYLPREDNISOLONE SODIUM SUCC 125 MG IJ SOLR
125.0000 mg | Freq: Once | INTRAMUSCULAR | Status: AC
  Administered 2022-01-04: 125 mg via INTRAVENOUS
  Filled 2022-01-04: qty 2

## 2022-01-04 MED ORDER — DOCUSATE SODIUM 100 MG PO CAPS
100.0000 mg | ORAL_CAPSULE | Freq: Two times a day (BID) | ORAL | Status: DC | PRN
  Administered 2022-01-05 – 2022-01-06 (×2): 100 mg via ORAL
  Filled 2022-01-04 (×2): qty 1

## 2022-01-04 MED ORDER — LACTATED RINGERS IV BOLUS
1000.0000 mL | Freq: Once | INTRAVENOUS | Status: AC
  Administered 2022-01-04: 1000 mL via INTRAVENOUS

## 2022-01-04 MED ORDER — HYDROMORPHONE HCL 1 MG/ML IJ SOLN
1.0000 mg | Freq: Once | INTRAMUSCULAR | Status: AC
  Administered 2022-01-04: 1 mg via INTRAVENOUS
  Filled 2022-01-04: qty 1

## 2022-01-04 MED ORDER — HEPARIN SODIUM (PORCINE) 5000 UNIT/ML IJ SOLN: 5000.0000 [IU] | Freq: Three times a day (TID) | INTRAMUSCULAR | Status: DC

## 2022-01-04 MED ORDER — POLYETHYLENE GLYCOL 3350 17 G PO PACK: 17.0000 g | PACK | Freq: Every day | ORAL | Status: DC | PRN

## 2022-01-04 MED ORDER — ESMOLOL HCL-SODIUM CHLORIDE 2000 MG/100ML IV SOLN
25.0000 ug/kg/min | INTRAVENOUS | Status: DC
  Administered 2022-01-04: 25 ug/kg/min via INTRAVENOUS
  Administered 2022-01-05 (×5): 125 ug/kg/min via INTRAVENOUS
  Administered 2022-01-05: 150 ug/kg/min via INTRAVENOUS
  Filled 2022-01-04 (×9): qty 100

## 2022-01-04 MED ORDER — HYDROMORPHONE HCL 1 MG/ML IJ SOLN
0.5000 mg | INTRAMUSCULAR | Status: DC | PRN
  Administered 2022-01-05: 0.5 mg via INTRAVENOUS
  Filled 2022-01-04: qty 1

## 2022-01-04 MED ORDER — LABETALOL HCL 5 MG/ML IV SOLN
20.0000 mg | Freq: Once | INTRAVENOUS | Status: AC
  Administered 2022-01-04: 20 mg via INTRAVENOUS
  Filled 2022-01-04: qty 4

## 2022-01-04 MED ORDER — FENTANYL CITRATE PF 50 MCG/ML IJ SOSY
50.0000 ug | PREFILLED_SYRINGE | Freq: Once | INTRAMUSCULAR | Status: AC
  Administered 2022-01-04: 50 ug via INTRAVENOUS
  Filled 2022-01-04: qty 1

## 2022-01-04 MED ORDER — IOHEXOL 350 MG/ML SOLN
100.0000 mL | Freq: Once | INTRAVENOUS | Status: AC | PRN
  Administered 2022-01-04: 100 mL via INTRAVENOUS

## 2022-01-04 MED ORDER — INSULIN ASPART 100 UNIT/ML IJ SOLN
0.0000 [IU] | INTRAMUSCULAR | Status: DC
  Administered 2022-01-05 (×2): 8 [IU] via SUBCUTANEOUS
  Administered 2022-01-05: 5 [IU] via SUBCUTANEOUS

## 2022-01-04 MED ORDER — AMLODIPINE BESYLATE 10 MG PO TABS
10.0000 mg | ORAL_TABLET | Freq: Every day | ORAL | Status: DC
  Administered 2022-01-05 – 2022-01-13 (×9): 10 mg via ORAL
  Filled 2022-01-04 (×9): qty 1

## 2022-01-04 MED ORDER — LACTATED RINGERS IV SOLN: INTRAVENOUS | Status: DC

## 2022-01-04 MED ORDER — LABETALOL HCL 5 MG/ML IV SOLN: 10.0000 mg | Freq: Once | INTRAVENOUS | Status: DC

## 2022-01-04 MED ORDER — ONDANSETRON HCL 4 MG/2ML IJ SOLN
4.0000 mg | Freq: Once | INTRAMUSCULAR | Status: AC
  Administered 2022-01-04: 4 mg via INTRAVENOUS
  Filled 2022-01-04: qty 2

## 2022-01-04 MED ORDER — ACETAMINOPHEN 325 MG PO TABS
650.0000 mg | ORAL_TABLET | Freq: Four times a day (QID) | ORAL | Status: DC | PRN
  Administered 2022-01-05 – 2022-01-16 (×7): 650 mg via ORAL
  Filled 2022-01-04 (×8): qty 2

## 2022-01-04 MED ORDER — HYDRALAZINE HCL 50 MG PO TABS
50.0000 mg | ORAL_TABLET | Freq: Three times a day (TID) | ORAL | Status: DC
  Administered 2022-01-05 (×2): 50 mg via ORAL
  Filled 2022-01-04: qty 1
  Filled 2022-01-04: qty 2

## 2022-01-04 NOTE — ED Notes (Signed)
In CT

## 2022-01-04 NOTE — ED Provider Triage Note (Signed)
Emergency Medicine Provider Triage Evaluation Note  Shaun Cummings , a 46 y.o. male  was evaluated in triage.  Pt complains of abdominal pain which radiates into the back rated 10 out of 10 in severity described as a ripping or tearing.  Patient is significantly diaphoretic at this time.  Patient denies nausea, vomiting, abdominal pain, shortness of breath.  Pain began approximately 1 and half hours prior to emergency department arrival.  Review of Systems  Positive: As above Negative: As above  Physical Exam  There were no vitals taken for this visit. Gen:   Awake, patient diaphoretic, appears uncomfortable Resp:  Normal effort  MSK:   Moves extremities without difficulty  Other:    Medical Decision Making  Medically screening exam initiated at 8:02 PM.  Appropriate orders placed.  Shaun Cummings was informed that the remainder of the evaluation will be completed by another provider, this initial triage assessment does not replace that evaluation, and the importance of remaining in the ED until their evaluation is complete.  Patient getting room. Concern for possible dissection.    Dorothyann Peng, PA-C 01/04/22 2019

## 2022-01-04 NOTE — ED Notes (Signed)
Vascular md at bedside

## 2022-01-04 NOTE — ED Triage Notes (Signed)
Pt arrived complaining of sudden onset of chest pain that radiates to back.  Pt is diaphoretic and unable to sit still while triaging   Pt brought back to room 16 by RN and report given to Darliss Ridgel and MD notified

## 2022-01-04 NOTE — ED Provider Notes (Signed)
[facility] EMERGENCY DEPARTMENT Provider Note   CSN: [id number] Arrival date & time: 01/04/22  1953     History  Chief Complaint  Patient presents with   Chest Pain    Shaun Cummings is a 46 y.o. male with past medical history significant for diabetes, hypertension presenting with pain that started approximately an hour and a half prior to arrival.  He reports that it started in his abdomen and moved up to his chest radiating to his back.  He endorses diaphoresis.  He denies numbness, tingling, lightheadedness, dizziness.  He endorses feeling hot intermittently.  He states he tried to make himself vomit to see if it would help, and it did not.  He was very recently diagnosed with hypertension and started taking his medication.  He is not sure what is called.  He denies fever, falls.  He endorses feeling thirsty.     Home Medications Prior to Admission medications   Medication Sig Start Date End Date Taking? Authorizing Provider  amlodipine-olmesartan (AZOR) 10-20 MG tablet Take 1 tablet by mouth daily. 12/30/21 03/30/22 Yes Iona Beard, MD  empagliflozin (JARDIANCE) 10 MG TABS tablet Take 1 tablet (10 mg total) by mouth daily before breakfast. 12/30/21 12/25/22 Yes Iona Beard, MD  glipiZIDE (GLUCOTROL) 5 MG tablet Take 1 tablet (5 mg total) by mouth daily before breakfast. 05/21/21 02/15/22 Yes Gaylan Gerold, DO  predniSONE (DELTASONE) 50 MG tablet Take 1 tablet (50 mg total) by mouth as directed. Please take 1 tablet 13 hours, 7 hours, and 1 hour prior to CT hematuria scan to prevent contrast reaction Patient not taking: Reported on 01/04/2022 12/22/21   Iona Beard, MD  Continuous Blood Gluc Sensor (FREESTYLE LIBRE 3 SENSOR) MISC 1 Units by Does not apply route every 14 (fourteen) days. Place 1 sensor on the skin every 14 days. Use to check glucose continuously 12/30/21   Iona Beard, MD      Allergies    Augmentin [amoxicillin-pot clavulanate] and Contrast  media [iodinated contrast media]    Review of Systems   Review of Systems  Cardiovascular:  Positive for chest pain.    Physical Exam Updated Vital Signs BP (!) 195/108   Pulse 73   Temp (!) 97.5 F (36.4 C)   Resp 14   Ht 5\' 11"  (1.803 m)   Wt 112 kg   SpO2 98%   BMI 34.45 kg/m  Physical Exam Constitutional:      Appearance: He is obese. He is ill-appearing and diaphoretic.  HENT:     Head: Normocephalic and atraumatic.  Cardiovascular:     Rate and Rhythm: Normal rate and regular rhythm.     Pulses:          Radial pulses are 2+ on the right side and 2+ on the left side.       Dorsalis pedis pulses are 2+ on the right side and 2+ on the left side.     Heart sounds: Heart sounds are distant. No murmur heard. Pulmonary:     Effort: Pulmonary effort is normal.     Breath sounds: Normal breath sounds.  Abdominal:     Palpations: Abdomen is soft.     Tenderness: There is abdominal tenderness (epigastric). There is no guarding or rebound.  Musculoskeletal:     Right lower leg: No edema.     Left lower leg: No edema.  Neurological:     Mental Status: He is alert.     ED Results /  Procedures / Treatments   Labs (all labs ordered are listed, but only abnormal results are displayed) Labs Reviewed  CBC - Abnormal; Notable for the following components:      Result Value   WBC 17.4 (*)    HCT 38.8 (*)    Platelets 426 (*)    All other components within normal limits  COMPREHENSIVE METABOLIC PANEL - Abnormal; Notable for the following components:   Potassium 3.4 (*)    Glucose, Bld 242 (*)    BUN 24 (*)    Creatinine, Ser 1.90 (*)    Albumin 3.1 (*)    GFR, Estimated 44 (*)    All other components within normal limits  TROPONIN I (HIGH SENSITIVITY) - Abnormal; Notable for the following components:   Troponin I (High Sensitivity) 19 (*)    All other components within normal limits  TROPONIN I (HIGH SENSITIVITY) - Abnormal; Notable for the following components:    Troponin I (High Sensitivity) 22 (*)    All other components within normal limits  LIPASE, BLOOD  HIV ANTIBODY (ROUTINE TESTING W REFLEX)  CBC  CREATININE, SERUM  CBC  BASIC METABOLIC PANEL  MAGNESIUM  RAPID URINE DRUG SCREEN, HOSP PERFORMED    EKG None  Radiology DG Chest 2 View  Result Date: 01/04/2022 CLINICAL DATA:  Chest pain EXAM: CHEST - 2 VIEW COMPARISON:  CT from earlier in the same day. FINDINGS: The heart size and mediastinal contours are within normal limits. Both lungs are clear. The visualized skeletal structures are unremarkable. IMPRESSION: No active cardiopulmonary disease. Electronically Signed   By: Inez Catalina M.D.   On: 01/04/2022 22:48   CT Angio Chest/Abd/Pel for Dissection W and/or Wo Contrast  Result Date: 01/04/2022 CLINICAL DATA:  Acute aortic syndrome suspected. EXAM: CT ANGIOGRAPHY CHEST, ABDOMEN AND PELVIS TECHNIQUE: Non-contrast CT of the chest was initially obtained. Multidetector CT imaging through the chest, abdomen and pelvis was performed using the standard protocol during bolus administration of intravenous contrast. Multiplanar reconstructed images and MIPs were obtained and reviewed to evaluate the vascular anatomy. RADIATION DOSE REDUCTION: This exam was performed according to the departmental dose-optimization program which includes automated exposure control, adjustment of the mA and/or kV according to patient size and/or use of iterative reconstruction technique. CONTRAST:  189mL OMNIPAQUE IOHEXOL 350 MG/ML SOLN COMPARISON:  06/18/2020. FINDINGS: CTA CHEST FINDINGS Cardiovascular: The heart is mildly enlarged and there is no pericardial effusion. Scattered coronary artery calcifications are present. The aorta is normal in caliber without evidence aneurysm or dissection. The pulmonary trunk is normal in caliber. Mediastinum/Nodes: No mediastinal, hilar, or axillary lymphadenopathy. The thyroid gland, trachea, and esophagus are within normal limits.  Lungs/Pleura: Dependent atelectasis is noted bilaterally. No effusion or pneumothorax. Musculoskeletal: No chest wall abnormality. No acute osseous abnormality. Review of the MIP images confirms the above findings. CTA ABDOMEN AND PELVIS FINDINGS VASCULAR Aorta: Aortic atherosclerosis with mural thrombus. There is a small penetrating ulcer on the left in the infrarenal abdominal aorta measuring 3 mm, coronal image 78. No intramural hematoma. Normal caliber aorta without aneurysm, dissection, vasculitis or significant stenosis. Celiac: Patent without evidence of aneurysm, dissection, vasculitis or significant stenosis. SMA: Patent without evidence of aneurysm, dissection, vasculitis or significant stenosis. Renals: Both renal arteries are patent without evidence of aneurysm, dissection, vasculitis, fibromuscular dysplasia or significant stenosis. IMA: Patent. Inflow: Patent without evidence of aneurysm, dissection, vasculitis or significant stenosis. Veins: No obvious venous abnormality within the limitations of this arterial phase study. Review of the MIP  images confirms the above findings. NON-VASCULAR Hepatobiliary: No focal liver abnormality is seen. No gallstones, gallbladder wall thickening, or biliary dilatation. Pancreas: Unremarkable. No pancreatic ductal dilatation or surrounding inflammatory changes. Spleen: Normal in size without focal abnormality. Adrenals/Urinary Tract: The adrenal glands are within normal limits. Nonobstructive left renal calculi. No hydronephrosis bilaterally. The bladder is unremarkable. Stomach/Bowel: Stomach is within normal limits. Appendix appears normal. No evidence of bowel wall thickening, distention, or inflammatory changes. No effusion or pneumothorax. Scattered diverticula along the colon without evidence of diverticulitis. Lymphatic: No abdominal or pelvic lymphadenopathy. Reproductive: Mildly enlarged prostate gland. Other: No abdominopelvic ascites.  Fat containing  umbilical hernia. Musculoskeletal: Degenerative changes are present in the lumbar spine. No acute osseous abnormality. Review of the MIP images confirms the above findings. IMPRESSION: 1. No evidence aortic aneurysm or dissection. 2. 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 No mural hematoma. 3. Nonobstructive left renal calculi. 4. Cardiomegaly with coronary artery calcifications. 5. Colonic diverticulosis without diverticulitis. Electronically Signed   By: Brett Fairy M.D.   On: 01/04/2022 21:57    Procedures Procedures    Medications Ordered in ED Medications  diphenhydrAMINE (BENADRYL) 25 mg in sodium chloride 0.9 % 50 mL IVPB (has no administration in time range)  esmolol (BREVIBLOC) 2000 mg / 100 mL (20 mg/mL) infusion (25 mcg/kg/min  112 kg Intravenous New Bag/Given 01/04/22 2241)  clevidipine (CLEVIPREX) infusion 0.5 mg/mL (has no administration in time range)  amLODipine (NORVASC) tablet 10 mg (has no administration in time range)  hydrALAZINE (APRESOLINE) tablet 50 mg (has no administration in time range)  docusate sodium (COLACE) capsule 100 mg (has no administration in time range)  polyethylene glycol (MIRALAX / GLYCOLAX) packet 17 g (has no administration in time range)  lactated ringers infusion (has no administration in time range)  acetaminophen (TYLENOL) tablet 650 mg (has no administration in time range)  HYDROmorphone (DILAUDID) injection 0.5 mg (has no administration in time range)  naloxone (NARCAN) injection 0.4 mg (has no administration in time range)  insulin aspart (novoLOG) injection 0-15 Units (has no administration in time range)  fentaNYL (SUBLIMAZE) injection 50 mcg (50 mcg Intravenous Given 01/04/22 2050)  diphenhydrAMINE (BENADRYL) injection 50 mg (50 mg Intravenous Given 01/04/22 2050)  methylPREDNISolone sodium succinate (SOLU-MEDROL) 125 mg/2 mL injection  125 mg (125 mg Intravenous Given 01/04/22 2050)  HYDROmorphone (DILAUDID) injection 1 mg (1 mg Intravenous Given 01/04/22 2202)  iohexol (OMNIPAQUE) 350 MG/ML injection 100 mL (100 mLs Intravenous Contrast Given 01/04/22 2131)  lactated ringers bolus 1,000 mL (0 mLs Intravenous Stopped 01/04/22 2241)  labetalol (NORMODYNE) injection 20 mg (20 mg Intravenous Given 01/04/22 2202)  HYDROmorphone (DILAUDID) injection 1 mg (1 mg Intravenous Given 01/04/22 2258)  ondansetron (ZOFRAN) injection 4 mg (4 mg Intravenous Given 01/04/22 2257)    ED Course/ Medical Decision Making/ A&P                           Medical Decision Making Amount and/or Complexity of Data Reviewed Labs: ordered. Decision-making details documented in ED Course. Radiology: ordered and independent interpretation performed. Decision-making details documented in ED Course. ECG/medicine tests: ordered and independent interpretation performed. Decision-making details documented in ED Course.  Risk Prescription drug management. Parenteral controlled substances. Decision regarding hospitalization.    Patient presents as above.  In the setting of his hypertension, chest and upper abdominal pain that radiates to his  back, diaphoresis, and concern for dissection.  Patient's allergies note that he previously had 1 hive on his forehead following administration of IV contrast that was treated with Benadryl.  I discussed this patient with the radiologist, Dr. Amedeo Gory, who recommends IV Solu-Medrol, IV Benadryl, and scan.  The risks of missing a dissection are significantly greater than the risks of allergic reaction.  I discussed this with the patient and his partner at bedside who expressed understanding and agreement.  Differential also includes ACS, pancreatitis, hypertensive urgency.  EKG, labs ordered.  Patient given IV fentanyl, IV Benadryl, IV Solu-Medrol.  Patient continued to be in pain and hypertensive.  IV labetalol, IV Dilaudid  ordered.  EKG showed sinus rhythm, slight ST depression in lateral leads, slightly peaked T waves in V3, no ST elevations.  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  Patient has cardiomegaly.  Diverticulosis, no signs of acute diverticulitis.  Pancreas unremarkable.  Patient continued to be hypertensive, esmolol drip ordered. I discussed the patient with on-call provider for vascular surgery.  Plan to medically manage with blood pressure control.  I discussed the patient with critical care.  Patient will be admitted to their service for blood pressure control, pain management, further evaluation and management of the penetrating ulcer in his infrarenal abdominal aorta.        Final Clinical Impression(s) / ED Diagnoses Final diagnoses:  AKI (acute kidney injury) [facility])    Rx / [place] Orders ED Discharge Orders     None         Luster Landsberg, MD 01/04/22 2350    Elgie Congo, MD 01/05/22 0210

## 2022-01-04 NOTE — H&P (Signed)
NAME:  Shaun Cummings, MRN:  [id number], DOB:  05/19/1975, LOS: 0 ADMISSION DATE:  01/04/2022, CONSULTATION DATE:  12/10 REFERRING MD:  Nechama Guard, CHIEF COMPLAINT:  chest pain   History of Present Illness:  Shaun Cummings, is a 46 y.o. 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 No mural hematoma.  No evidence of aortic aneurysm or dissection.  Creatinine 1.9.  Vascular surgery was consulted in the emergency department.  Recommends medical management goal systolic blood pressure less than 120.  Started on esmolol drip.  PCCM was consulted for admission.  Pertinent  Medical History  HTN, HLD, OSA, DM2  Significant Hospital Events: Including procedures, antibiotic start and stop dates in addition to other pertinent events   12/10 presented to [facility]. 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 rate 19, height _0  (1.803 m), weight 112 kg, SpO2 98 %.        Intake/Output Summary (Last 24 hours) at 01/04/2022 2243 Last data filed at 01/04/2022 2241 Gross per 24 hour  Intake 1000 ml  Output --  Net 1000 ml   Filed Weights   01/04/22 2009  Weight: 112 kg    Examination: General: In bed, NAD, appears comfortable HEENT: MM pink/moist, anicteric, atraumatic Neuro: RASS 0, PERRL 328m GCS 15 CV: S1S2, NSR, no m/r/g appreciated PULM:  clear in the upper lobes, clear in the lower lobes, trachea midline, chest  expansion symmetric GI: soft, bsx4 active, non-tender   Extremities: warm/dry, no pretibial edema, capillary refill less than 3 seconds  Skin:  no rashes or lesions noted  Labs/imaging WBC 17.4 Platelets 426 Troponin 19>22 K 3.4 Glucose 242 Creatinine 1.9, BUN 24 Albumin 3.1 Lipase normal limits Chest x-ray: No pneumothorax, no significant pleural effusion, no infiltrate Twelve-lead: Sinus tach, some T wave changes in lateral leads CTA chest abdomen pelvis: small 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 No mural hematoma.  No evidence of aortic aneurysm or dissection  Resolved Hospital Problem list     Assessment & Plan:  Hypertensive emergency 73m68menetrating ulcer in the infrarenal abdominal aorta  HX HTN Troponin elevation, suspect demand Chest pain, secondary to HTN and ulcer BP on admission 206/111, troponin 19>22, creatinine 1.9.  Ulcer seen on CTA.  Suspect leukocytosis inflammatory process from ulcer.  Patient recently seen 11/21 for uncontrolled hypertension not taking blood pressure medicines.  Blood pressure 214/114 on evaluation at outpatient clinic.  Suspect penetrating ulcer secondary to persistent uncontrolled hypertension.  Former smoker.  Quit smoking 3 weeks ago. -Admit to ICU with continuous blood pressure SpO2 monitoring -Goal systolic less than 120371n esmolol drip, start Cleviprex drip -Continue amlodipine 73m4mily, start p.o. hydralazine 50mg39m -Appreciate vascular surgery assistance.  Plan for rescan in 48 to 72 hours. -Check UDS -Monitor neuroexam -PRN EKG, trend troponin -Continue to encourage smoking cessation  AKI Suspect secondary to infrarenal ulcer vs dehydration. Creatinine 1.9,  BUN 24. -s/p 1L LR bolus. LR at 50 for 24 hours. -Ensure renal perfusion. Goal MAP 65 or greater. -Avoid neprotoxic drugs as possible. -Strict I&O's -Follow up AM creatinine  Hypokalemia K 3.4 -recheck in AM post LR  infusion  DM2 Glucose 242.  A1c 11.5 in November. -Blood Glucose goal 140-180. -SSI  OSA -CPAP   Best Practice (right click and "Reselect all SmartList Selections" daily)   Diet/type: Regular consistency (see orders) DVT prophylaxis: prophylactic heparin  GI prophylaxis: N/A Lines: N/A Foley:  N/A Code Status:  full code Last date of multidisciplinary goals of care discussion [Full scope 12/10]  Labs   CBC: Recent Labs  Lab 01/04/22 2037  WBC 17.4*  HGB 13.5  HCT 38.8*  MCV 84.9  PLT 426*    Basic Metabolic Panel: Recent Labs  Lab 01/04/22 2037  NA 138  K 3.4*  CL 102  CO2 22  GLUCOSE 242*  BUN 24*  CREATININE 1.90*  CALCIUM 9.0   GFR: Estimated Creatinine Clearance: 63.2 mL/min (A) (by C-G formula based on SCr of 1.9 mg/dL (H)). Recent Labs  Lab 01/04/22 2037  WBC 17.4*    Liver Function Tests: Recent Labs  Lab 01/04/22 2037  AST 17  ALT 14  ALKPHOS 81  BILITOT 0.3  PROT 7.4  ALBUMIN 3.1*   Recent Labs  Lab 01/04/22 2037  LIPASE 32   No results for input(s): "AMMONIA" in the last 168 hours.  ABG    Component Value Date/Time   TCO2 25 02/10/2016 1307     Coagulation Profile: No results for input(s): "INR", "PROTIME" in the last 168 hours.  Cardiac Enzymes: No results for input(s): "CKTOTAL", "CKMB", "CKMBINDEX", "TROPONINI" in the last 168 hours.  HbA1C: Hemoglobin A1C  Date/Time Value Ref Range Status  12/16/2021 09:28 AM 11.5 (A) 4.0 - 5.6 % Final  04/30/2021 10:27 AM 9.6 (A) 4.0 - 5.6 % Final   HbA1c POC (<> result, manual entry)  Date/Time Value Ref Range Status  03/09/2018 04:18 PM >14.0 4.0 - 5.6 % Final    Comment:    CBG Critical High  512   results reported to Adline Peals, [facility] 03-09-2018 1608   T Fulcher, PBT  09/08/2017 12:11 PM >14.0 (A) 4.0 - 5.6 % Final   Hgb A1c MFr Bld  Date/Time Value Ref Range Status  06/18/2020 04:26 PM 10.0 (H) 4.8 - 5.6 % Final    Comment:    (NOTE)         Prediabetes: 5.7 -  6.4         Diabetes: >6.4         Glycemic control for adults with diabetes: <7.0     CBG: Recent Labs  Lab 12/30/21 1148  GLUCAP 162*    Review of Systems:   Positives in bold  Gen: fever, chills, weight change, fatigue, night sweats HEENT:  blurred vision, double vision, hearing loss, tinnitus, sinus congestion, rhinorrhea, sore throat, neck stiffness, dysphagia PULM:  shortness of breath, cough, sputum production, hemoptysis, wheezing CV: chest pain, edema, orthopnea, paroxysmal nocturnal dyspnea, palpitations GI:  abdominal pain, nausea, vomiting, diarrhea, hematochezia, melena, constipation, change in bowel habits GU: dysuria, hematuria, polyuria, oliguria, urethral discharge Endocrine: hot or cold intolerance, polyuria, polyphagia or appetite change Derm: rash, dry skin, scaling or peeling skin change Heme: easy bruising, bleeding, bleeding gums Neuro: headache, numbness, weakness, slurred speech, loss of memory or consciousness   Past Medical History:  He,  has a past medical history  of Chest pain, HTN (hypertension), Hyperlipidemia, Lesion of penis, OSA (obstructive sleep apnea), Peripheral neuropathy, Phimosis, Scrotal abscess (05/08/2017), and Type 2 diabetes mellitus ([place]).   Surgical History:   Past Surgical History:  Procedure Laterality Date   CIRCUMCISION N/A 02/10/2016   Procedure: CIRCUMCISION ADULT;  Surgeon: Cleon Gustin, MD;  Location: [facility];  Service: Urology;  Laterality: N/A;   INCISION AND DRAINAGE ABSCESS Right 05/12/2017   Procedure: INCISION AND DRAINAGE RIGHT INGUINAL ABSCESS;  Surgeon: Erroll Luna, MD;  Location: [place];  Service: General;  Laterality: Right;   INCISION AND DRAINAGE ABSCESS N/A 03/24/2018   Procedure: INCISION AND DRAINAGE POSTERIOR NECK ABSCESS;  Surgeon: Excell Seltzer, MD;  Location: [place];  Service: General;  Laterality: N/A;   INCISION AND DRAINAGE ABSCESS Right 06/19/2020   Procedure:  INCISION AND DRAINAGE ABSCESS, RIGHT GROIN;  Surgeon: Johnathan Hausen, MD;  Location: [facility];  Service: General;  Laterality: Right;   INCISION AND DRAINAGE PERIRECTAL ABSCESS Right 05/09/2017   Procedure: IRRIGATION AND DEBRIDEMENT PERINEAL ABSCESS;  Surgeon: Donnie Mesa, MD;  Location: [place];  Service: General;  Laterality: Right;     Social History:   reports that he quit smoking about 13 years ago. His smoking use included cigarettes. He has never used smokeless tobacco. He reports that he does not drink alcohol and does not use drugs.   Family History:  His family history includes Angina in his mother; Asthma in his son; CAD in his mother; Diabetes in his brother, father, maternal grandfather, maternal grandmother, mother, paternal grandfather, and paternal grandmother; Glaucoma in his father, maternal grandfather, maternal grandmother, paternal grandfather, and paternal grandmother; Heart attack in his father; Heart disease in his maternal grandfather, maternal grandmother, and mother; Kidney failure in his father.   Allergies Allergies  Allergen Reactions   Augmentin [Amoxicillin-Pot Clavulanate] Hives and Itching    Has patient had a PCN reaction causing immediate rash, facial/tongue/throat swelling, SOB or lightheadedness with hypotension: Yes Has patient had a PCN reaction causing severe rash involving mucus membranes or skin necrosis: No Has patient had a PCN reaction that required hospitalization: No Has patient had a PCN reaction occurring within the last 10 years: No If all of the above answers are "NO", then may proceed with Cephalosporin use.   Contrast Media [Iodinated Contrast Media] Hives    Pt broke out with 1 hive on forehead after CT injection-treated with 22m Benadryl     Home Medications  Prior to Admission medications   Medication Sig Start Date End Date Taking? Authorizing Provider  predniSONE (DELTASONE) 50 MG tablet Take 1 tablet (50 mg total) by mouth as  directed. Please take 1 tablet 13 hours, 7 hours, and 1 hour prior to CT hematuria scan to prevent contrast reaction 12/22/21   LIona Beard MD  amlodipine-olmesartan (AZOR) 10-20 MG tablet Take 1 tablet by mouth daily. 12/30/21 03/30/22  LIona Beard MD  Continuous Blood Gluc Sensor (FREESTYLE LIBRE 3 SENSOR) MISC 1 Units by Does not apply route every 14 (fourteen) days. Place 1 sensor on the skin every 14 days. Use to check glucose continuously 12/30/21   LIona Beard MD  empagliflozin (JARDIANCE) 10 MG TABS tablet Take 1 tablet (10 mg total) by mouth daily before breakfast. 12/30/21 12/25/22  LIona Beard MD  glipiZIDE (GLUCOTROL) 5 MG tablet Take 1 tablet (5 mg total) by mouth daily before breakfast. 05/21/21 02/15/22  NGaylan Gerold DO     Critical care time: 32 minutes  The patient is critically ill with multiple organ systems failure and requires high complexity decision making for assessment and support, frequent evaluation and titration of therapies, application of advanced monitoring technologies and extensive interpretation of multiple databases.    Critical Care Time devoted to patient care services described in this note is 32 minutes. This time reflects time of care of this [place] NP. This critical care time does not reflect procedure time but could involve care discussion time with the PCCM attending.  Redmond School., MSN, APRN, AGACNP-BC [facility]  01/04/2022 , 11:44 PM  Please see [email or web address] for pager details  If no response, please call [phone] After hours, please call Elink at [phone]

## 2022-01-04 NOTE — ED Notes (Signed)
Pt transported to CT with RN

## 2022-01-04 NOTE — Consult Note (Signed)
Hospital Consult    Reason for Consult:  infrarenal aortic PAU Referring Physician:  Dr. Nechama Guard MRN #:  [id number]  History of Present Illness: This is a 46 y.o. male presents to department with acute abdominal pain in the epigastric region radiating directly to his back with concern for acute aortic syndrome.  He is now status post CT scan.  Pain remains a concern but is somewhat improved with medicine.  Blood pressure on arrival well over 409 mmHg systolic now in the 811 systolic range.  He has never had similar pain.  He does not have any known history of vascular disease does have known hypertension and missed his meds also has hyperlipidemia and a family history of vascular disease in his mother.  He is a former smoker just quit 3 weeks ago.  Past Medical History:  Diagnosis Date   Chest pain    HTN (hypertension)    Hyperlipidemia    Lesion of penis    foreskin   OSA (obstructive sleep apnea)    per pt dx osa and used cpap but after losing wt. from 415 pounds down to 244 pounds no longer needs cpap   Peripheral neuropathy    Phimosis    Scrotal abscess 05/08/2017   Type 2 diabetes mellitus ([place])    per pt no meds for two years pt was changed to levemir unable to afford but pt states is waiting on approval for a program he applied for that will pay for his meds ([place])      Past Surgical History:  Procedure Laterality Date   CIRCUMCISION N/A 02/10/2016   Procedure: CIRCUMCISION ADULT;  Surgeon: Cleon Gustin, MD;  Location: [facility];  Service: Urology;  Laterality: N/A;   INCISION AND DRAINAGE ABSCESS Right 05/12/2017   Procedure: INCISION AND DRAINAGE RIGHT INGUINAL ABSCESS;  Surgeon: Erroll Luna, MD;  Location: [place];  Service: General;  Laterality: Right;   INCISION AND DRAINAGE ABSCESS N/A 03/24/2018   Procedure: INCISION AND DRAINAGE POSTERIOR NECK ABSCESS;  Surgeon: Excell Seltzer, MD;  Location: [place];  Service: General;   Laterality: N/A;   INCISION AND DRAINAGE ABSCESS Right 06/19/2020   Procedure: INCISION AND DRAINAGE ABSCESS, RIGHT GROIN;  Surgeon: Johnathan Hausen, MD;  Location: [facility];  Service: General;  Laterality: Right;   INCISION AND DRAINAGE PERIRECTAL ABSCESS Right 05/09/2017   Procedure: IRRIGATION AND DEBRIDEMENT PERINEAL ABSCESS;  Surgeon: Donnie Mesa, MD;  Location: [place];  Service: General;  Laterality: Right;    Allergies  Allergen Reactions   Augmentin [Amoxicillin-Pot Clavulanate] Hives and Itching    Has patient had a PCN reaction causing immediate rash, facial/tongue/throat swelling, SOB or lightheadedness with hypotension: Yes Has patient had a PCN reaction causing severe rash involving mucus membranes or skin necrosis: No Has patient had a PCN reaction that required hospitalization: No Has patient had a PCN reaction occurring within the last 10 years: No If all of the above answers are "NO", then may proceed with Cephalosporin use.   Contrast Media [Iodinated Contrast Media] Hives    Pt broke out with 1 hive on forehead after CT injection-treated with 25mg  Benadryl    Prior to Admission medications   Medication Sig Start Date End Date Taking? Authorizing Provider  predniSONE (DELTASONE) 50 MG tablet Take 1 tablet (50 mg total) by mouth as directed. Please take 1 tablet 13 hours, 7 hours, and 1 hour prior to CT hematuria scan to prevent contrast reaction 12/22/21  Iona Beard, MD  amlodipine-olmesartan (AZOR) 10-20 MG tablet Take 1 tablet by mouth daily. 12/30/21 03/30/22  Iona Beard, MD  Continuous Blood Gluc Sensor (FREESTYLE LIBRE 3 SENSOR) MISC 1 Units by Does not apply route every 14 (fourteen) days. Place 1 sensor on the skin every 14 days. Use to check glucose continuously 12/30/21   Iona Beard, MD  empagliflozin (JARDIANCE) 10 MG TABS tablet Take 1 tablet (10 mg total) by mouth daily before breakfast. 12/30/21 12/25/22  Iona Beard, MD  glipiZIDE (GLUCOTROL) 5 MG  tablet Take 1 tablet (5 mg total) by mouth daily before breakfast. 05/21/21 02/15/22  Gaylan Gerold, DO    Social History   Socioeconomic History   Marital status: Married    Spouse name: Not on file   Number of children: 2   Years of education: college    Highest education level: Not on file  Occupational History   Occupation: [employer]   Tobacco Use   Smoking status: Former    Years: 2.00    Types: Cigarettes    Quit date: 02/05/2008    Years since quitting: 13.9   Smokeless tobacco: Never  Vaping Use   Vaping Use: Never used  Substance and Sexual Activity   Alcohol use: No   Drug use: No   Sexual activity: Not on file  Other Topics Concern   Not on file  Social History Narrative   Lives with wife and 2 kids, age 3 and 5.    Social Determinants of Health   Financial Resource Strain: Not on file  Food Insecurity: Not on file  Transportation Needs: Not on file  Physical Activity: Not on file  Stress: Not on file  Social Connections: Not on file  Intimate Partner Violence: Not on file     Family History  Problem Relation Age of Onset   Diabetes Mother    Angina Mother    CAD Mother    Heart disease Mother    Glaucoma Father    Diabetes Father    Heart attack Father    Kidney failure Father    Diabetes Brother    Glaucoma Maternal Grandmother    Diabetes Maternal Grandmother    Heart disease Maternal Grandmother    Glaucoma Maternal Grandfather    Diabetes Maternal Grandfather    Heart disease Maternal Grandfather    Glaucoma Paternal Grandmother    Diabetes Paternal Grandmother    Glaucoma Paternal Grandfather    Diabetes Paternal Grandfather    Asthma Son     Review of Systems  Constitutional: Negative.   HENT: Negative.    Eyes: Negative.   Respiratory: Negative.    Cardiovascular: Negative.   Gastrointestinal:  Positive for abdominal pain.  Musculoskeletal: Negative.   Skin: Negative.   Neurological: Negative.   Endo/Heme/Allergies:  Negative.   Psychiatric/Behavioral: Negative.        Physical Examination  Vitals:   01/04/22 2003 01/04/22 2045  BP: (!) 170/105 (!) 202/107  Pulse: 82 69  Resp: 19 12  Temp: (!) 97.5 F (36.4 C)   SpO2: 96% 98%   Body mass index is 34.45 kg/m.  Physical Exam HENT:     Head: Normocephalic.  Cardiovascular:     Rate and Rhythm: Normal rate.     Pulses:          Radial pulses are 2+ on the right side and 2+ on the left side.       Femoral pulses are 2+ on the right  side and 2+ on the left side.      Dorsalis pedis pulses are 2+ on the right side and 2+ on the left side.  Abdominal:     Palpations: Abdomen is soft.     Tenderness: There is abdominal tenderness.  Musculoskeletal:        General: Normal range of motion.     Cervical back: Normal range of motion.     Right lower leg: No edema.  Skin:    General: Skin is warm and dry.     Capillary Refill: Capillary refill takes less than 2 seconds.  Neurological:     General: No focal deficit present.     Mental Status: He is alert.  Psychiatric:        Mood and Affect: Mood normal.      CBC    Component Value Date/Time   WBC 17.4 (H) 01/04/2022 2037   RBC 4.57 01/04/2022 2037   HGB 13.5 01/04/2022 2037   HCT 38.8 (L) 01/04/2022 2037   PLT 426 (H) 01/04/2022 2037   MCV 84.9 01/04/2022 2037   MCH 29.5 01/04/2022 2037   MCHC 34.8 01/04/2022 2037   RDW 12.6 01/04/2022 2037   LYMPHSABS 3.5 06/18/2020 1226   MONOABS 1.1 (H) 06/18/2020 1226   EOSABS 0.2 06/18/2020 1226   BASOSABS 0.1 06/18/2020 1226    BMET    Component Value Date/Time   NA 138 01/04/2022 2037   NA 141 04/30/2021 1114   K 3.4 (L) 01/04/2022 2037   CL 102 01/04/2022 2037   CO2 22 01/04/2022 2037   GLUCOSE 242 (H) 01/04/2022 2037   BUN 24 (H) 01/04/2022 2037   BUN 17 04/30/2021 1114   CREATININE 1.90 (H) 01/04/2022 2037   CREATININE 0.91 09/15/2013 1727   CALCIUM 9.0 01/04/2022 2037   GFRNONAA 44 (L) 01/04/2022 2037   GFRAA >60  09/08/2019 1328    COAGS: No results found for: "INR", "PROTIME"   Non-Invasive Vascular Imaging:   CT IMPRESSION: 1. No evidence aortic aneurysm or dissection. 2. 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 No mural hematoma. 3. Nonobstructive left renal calculi. 4. Cardiomegaly with coronary artery calcifications. 5. Colonic diverticulosis without diverticulitis.  ASSESSMENT/PLAN: This is a 46 y.o. male here with elevated blood pressure and CT evidence of small penetrating aortic ulceration in the infrarenal abdominal aorta.  There was some evidence of aortic mural thrombus as far back as 2019 and the CT scan appears to be evolving from that scan unable to develop with chronicity but certainly given his pain concerning for acute change.  As long as pain is controlled with blood pressure management we can consider rescanning in 4 to 6 weeks as an outpatient but if pain is not not controlled we will likely need to rescan in 48 to 72 hours.  This was discussed with the patient and his family at bedside and they demonstrate good understanding.  Vascular surgery to follow.  Leilanni Halvorson C. Donzetta Matters, MD [facility] [place] Office: [phone] Pager: [phone]

## 2022-01-05 ENCOUNTER — Inpatient Hospital Stay (HOSPITAL_COMMUNITY): Payer: 59

## 2022-01-05 DIAGNOSIS — I161 Hypertensive emergency: Secondary | ICD-10-CM

## 2022-01-05 DIAGNOSIS — I5021 Acute systolic (congestive) heart failure: Secondary | ICD-10-CM | POA: Diagnosis not present

## 2022-01-05 DIAGNOSIS — R739 Hyperglycemia, unspecified: Secondary | ICD-10-CM

## 2022-01-05 DIAGNOSIS — I214 Non-ST elevation (NSTEMI) myocardial infarction: Secondary | ICD-10-CM | POA: Diagnosis not present

## 2022-01-05 DIAGNOSIS — I719 Aortic aneurysm of unspecified site, without rupture: Secondary | ICD-10-CM

## 2022-01-05 DIAGNOSIS — I7143 Infrarenal abdominal aortic aneurysm, without rupture: Secondary | ICD-10-CM | POA: Diagnosis not present

## 2022-01-05 DIAGNOSIS — R7989 Other specified abnormal findings of blood chemistry: Secondary | ICD-10-CM | POA: Diagnosis not present

## 2022-01-05 HISTORY — DX: Non-ST elevation (NSTEMI) myocardial infarction: I21.4

## 2022-01-05 LAB — RAPID URINE DRUG SCREEN, HOSP PERFORMED
Amphetamines: NOT DETECTED
Barbiturates: NOT DETECTED
Benzodiazepines: NOT DETECTED
Cocaine: NOT DETECTED
Opiates: NOT DETECTED
Tetrahydrocannabinol: POSITIVE — AB

## 2022-01-05 LAB — HEPARIN LEVEL (UNFRACTIONATED): Heparin Unfractionated: 0.16 IU/mL — ABNORMAL LOW (ref 0.30–0.70)

## 2022-01-05 LAB — GLUCOSE, CAPILLARY
Glucose-Capillary: 255 mg/dL — ABNORMAL HIGH (ref 70–99)
Glucose-Capillary: 277 mg/dL — ABNORMAL HIGH (ref 70–99)
Glucose-Capillary: 234 mg/dL — ABNORMAL HIGH (ref 70–99)
Glucose-Capillary: 293 mg/dL — ABNORMAL HIGH (ref 70–99)
Glucose-Capillary: 215 mg/dL — ABNORMAL HIGH (ref 70–99)
Glucose-Capillary: 200 mg/dL — ABNORMAL HIGH (ref 70–99)
Glucose-Capillary: 164 mg/dL — ABNORMAL HIGH (ref 70–99)
Glucose-Capillary: 230 mg/dL — ABNORMAL HIGH (ref 70–99)

## 2022-01-05 LAB — CBC
HCT: 35.8 % — ABNORMAL LOW (ref 39.0–52.0)
Hemoglobin: 12.6 g/dL — ABNORMAL LOW (ref 13.0–17.0)
MCH: 29.3 pg (ref 26.0–34.0)
MCHC: 35.2 g/dL (ref 30.0–36.0)
MCV: 83.3 fL (ref 80.0–100.0)
Platelets: 334 10*3/uL (ref 150–400)
RBC: 4.3 MIL/uL (ref 4.22–5.81)
RDW: 12.4 % (ref 11.5–15.5)
WBC: 17.6 10*3/uL — ABNORMAL HIGH (ref 4.0–10.5)
nRBC: 0 % (ref 0.0–0.2)

## 2022-01-05 LAB — ECHOCARDIOGRAM COMPLETE
Area-P 1/2: 5.66 cm2
Height: 71 in
S' Lateral: 4.7 cm
Single Plane A4C EF: 37.5 %
Weight: 3788.38 oz

## 2022-01-05 LAB — MRSA NEXT GEN BY PCR, NASAL: MRSA by PCR Next Gen: DETECTED — AB

## 2022-01-05 LAB — HIV ANTIBODY (ROUTINE TESTING W REFLEX): HIV Screen 4th Generation wRfx: NONREACTIVE

## 2022-01-05 LAB — TROPONIN I (HIGH SENSITIVITY)
Troponin I (High Sensitivity): 1719 ng/L (ref <18)
Troponin I (High Sensitivity): 4399 ng/L (ref <18)
Troponin I (High Sensitivity): 7294 ng/L (ref <18)

## 2022-01-05 LAB — CBG MONITORING, ED: Glucose-Capillary: 262 mg/dL — ABNORMAL HIGH (ref 70–99)

## 2022-01-05 LAB — BASIC METABOLIC PANEL
Anion gap: 12 (ref 5–15)
BUN: 23 mg/dL — ABNORMAL HIGH (ref 6–20)
CO2: 23 mmol/L (ref 22–32)
Calcium: 8.9 mg/dL (ref 8.9–10.3)
Chloride: 100 mmol/L (ref 98–111)
Creatinine, Ser: 1.74 mg/dL — ABNORMAL HIGH (ref 0.61–1.24)
GFR, Estimated: 49 mL/min — ABNORMAL LOW (ref >60)
Glucose, Bld: 277 mg/dL — ABNORMAL HIGH (ref 70–99)
Potassium: 3.4 mmol/L — ABNORMAL LOW (ref 3.5–5.1)
Sodium: 135 mmol/L (ref 135–145)

## 2022-01-05 LAB — MAGNESIUM: Magnesium: 1.8 mg/dL (ref 1.7–2.4)

## 2022-01-05 MED ORDER — SODIUM CHLORIDE 0.9 % WEIGHT BASED INFUSION
3.0000 mL/kg/h | INTRAVENOUS | Status: AC
  Administered 2022-01-05: 3 mL/kg/h via INTRAVENOUS

## 2022-01-05 MED ORDER — HEPARIN (PORCINE) 25000 UT/250ML-% IV SOLN
1600.0000 [IU]/h | INTRAVENOUS | Status: DC
  Administered 2022-01-05 – 2022-01-06 (×2): 1300 [IU]/h via INTRAVENOUS
  Filled 2022-01-05 (×2): qty 250

## 2022-01-05 MED ORDER — ORAL CARE MOUTH RINSE: 15.0000 mL | OROMUCOSAL | Status: DC | PRN

## 2022-01-05 MED ORDER — HYDRALAZINE HCL 50 MG PO TABS
75.0000 mg | ORAL_TABLET | Freq: Three times a day (TID) | ORAL | Status: DC
  Administered 2022-01-05 – 2022-01-12 (×20): 75 mg via ORAL
  Filled 2022-01-05 (×21): qty 1

## 2022-01-05 MED ORDER — DIPHENHYDRAMINE HCL 50 MG/ML IJ SOLN
50.0000 mg | Freq: Once | INTRAMUSCULAR | Status: AC
  Administered 2022-01-06: 50 mg via INTRAVENOUS
  Filled 2022-01-05: qty 1

## 2022-01-05 MED ORDER — MAGNESIUM SULFATE 2 GM/50ML IV SOLN
2.0000 g | Freq: Once | INTRAVENOUS | Status: AC
  Administered 2022-01-05: 2 g via INTRAVENOUS
  Filled 2022-01-05: qty 50

## 2022-01-05 MED ORDER — INSULIN GLARGINE-YFGN 100 UNIT/ML ~~LOC~~ SOLN
5.0000 [IU] | Freq: Every day | SUBCUTANEOUS | Status: DC
  Administered 2022-01-05 – 2022-01-07 (×2): 5 [IU] via SUBCUTANEOUS
  Filled 2022-01-05 (×4): qty 0.05

## 2022-01-05 MED ORDER — POTASSIUM CHLORIDE CRYS ER 20 MEQ PO TBCR
40.0000 meq | EXTENDED_RELEASE_TABLET | Freq: Once | ORAL | Status: AC
  Administered 2022-01-05: 40 meq via ORAL
  Filled 2022-01-05: qty 2

## 2022-01-05 MED ORDER — HYDROMORPHONE HCL 1 MG/ML IJ SOLN
1.0000 mg | INTRAMUSCULAR | Status: AC | PRN
  Administered 2022-01-05 (×2): 1 mg via INTRAVENOUS
  Filled 2022-01-05 (×2): qty 1

## 2022-01-05 MED ORDER — CARVEDILOL 12.5 MG PO TABS: 12.5000 mg | ORAL_TABLET | Freq: Two times a day (BID) | ORAL | Status: DC

## 2022-01-05 MED ORDER — CARVEDILOL 3.125 MG PO TABS: 3.1250 mg | ORAL_TABLET | Freq: Two times a day (BID) | ORAL | Status: DC

## 2022-01-05 MED ORDER — SODIUM CHLORIDE 0.9 % IV SOLN: 250.0000 mL | INTRAVENOUS | Status: DC | PRN

## 2022-01-05 MED ORDER — SODIUM CHLORIDE 0.9% FLUSH: 3.0000 mL | INTRAVENOUS | Status: DC | PRN

## 2022-01-05 MED ORDER — INSULIN ASPART 100 UNIT/ML IJ SOLN
0.0000 [IU] | Freq: Three times a day (TID) | INTRAMUSCULAR | Status: DC
  Administered 2022-01-05: 3 [IU] via SUBCUTANEOUS
  Administered 2022-01-05: 8 [IU] via SUBCUTANEOUS
  Administered 2022-01-06: 3 [IU] via SUBCUTANEOUS
  Administered 2022-01-06: 8 [IU] via SUBCUTANEOUS
  Administered 2022-01-07 (×2): 3 [IU] via SUBCUTANEOUS
  Administered 2022-01-07: 5 [IU] via SUBCUTANEOUS
  Administered 2022-01-08: 3 [IU] via SUBCUTANEOUS
  Administered 2022-01-08: 5 [IU] via SUBCUTANEOUS
  Administered 2022-01-08 – 2022-01-09 (×2): 3 [IU] via SUBCUTANEOUS
  Administered 2022-01-09: 8 [IU] via SUBCUTANEOUS
  Administered 2022-01-09 – 2022-01-11 (×4): 3 [IU] via SUBCUTANEOUS
  Administered 2022-01-11: 5 [IU] via SUBCUTANEOUS
  Administered 2022-01-11: 2 [IU] via SUBCUTANEOUS
  Administered 2022-01-12 – 2022-01-13 (×5): 3 [IU] via SUBCUTANEOUS
  Administered 2022-01-13: 2 [IU] via SUBCUTANEOUS
  Administered 2022-01-14: 3 [IU] via SUBCUTANEOUS
  Administered 2022-01-14 – 2022-01-15 (×2): 5 [IU] via SUBCUTANEOUS
  Administered 2022-01-15 (×2): 3 [IU] via SUBCUTANEOUS
  Administered 2022-01-16 (×2): 5 [IU] via SUBCUTANEOUS

## 2022-01-05 MED ORDER — HEPARIN BOLUS VIA INFUSION
4000.0000 [IU] | Freq: Once | INTRAVENOUS | Status: AC
  Administered 2022-01-05: 4000 [IU] via INTRAVENOUS
  Filled 2022-01-05: qty 4000

## 2022-01-05 MED ORDER — SODIUM CHLORIDE 0.9 % WEIGHT BASED INFUSION
1.0000 mL/kg/h | INTRAVENOUS | Status: DC
  Administered 2022-01-05 – 2022-01-06 (×3): 1 mL/kg/h via INTRAVENOUS

## 2022-01-05 MED ORDER — ROSUVASTATIN CALCIUM 20 MG PO TABS
20.0000 mg | ORAL_TABLET | Freq: Every day | ORAL | Status: DC
  Administered 2022-01-05: 20 mg via ORAL
  Filled 2022-01-05 (×2): qty 1

## 2022-01-05 MED ORDER — SODIUM CHLORIDE 0.9% FLUSH
3.0000 mL | Freq: Two times a day (BID) | INTRAVENOUS | Status: DC
  Administered 2022-01-05 – 2022-01-16 (×16): 3 mL via INTRAVENOUS

## 2022-01-05 MED ORDER — INSULIN ASPART 100 UNIT/ML IJ SOLN
0.0000 [IU] | Freq: Every day | INTRAMUSCULAR | Status: DC
  Administered 2022-01-05 – 2022-01-12 (×3): 2 [IU] via SUBCUTANEOUS
  Administered 2022-01-14: 3 [IU] via SUBCUTANEOUS

## 2022-01-05 MED ORDER — ASPIRIN 81 MG PO CHEW
81.0000 mg | CHEWABLE_TABLET | Freq: Every day | ORAL | Status: DC
  Administered 2022-01-05: 81 mg via ORAL
  Filled 2022-01-05: qty 1

## 2022-01-05 MED ORDER — CARVEDILOL 12.5 MG PO TABS
12.5000 mg | ORAL_TABLET | Freq: Two times a day (BID) | ORAL | Status: DC
  Administered 2022-01-05 – 2022-01-16 (×22): 12.5 mg via ORAL
  Filled 2022-01-05 (×22): qty 1

## 2022-01-05 MED ORDER — DIPHENHYDRAMINE HCL 25 MG PO CAPS: 50.0000 mg | ORAL_CAPSULE | Freq: Once | ORAL | Status: AC

## 2022-01-05 MED ORDER — CHLORHEXIDINE GLUCONATE CLOTH 2 % EX PADS
6.0000 | MEDICATED_PAD | Freq: Every day | CUTANEOUS | Status: DC
  Administered 2022-01-05 – 2022-01-12 (×7): 6 via TOPICAL

## 2022-01-05 MED ORDER — CARVEDILOL 6.25 MG PO TABS: 6.2500 mg | ORAL_TABLET | Freq: Two times a day (BID) | ORAL | Status: DC

## 2022-01-05 MED ORDER — MUPIROCIN 2 % EX OINT
1.0000 | TOPICAL_OINTMENT | Freq: Two times a day (BID) | CUTANEOUS | Status: AC
  Administered 2022-01-05 – 2022-01-09 (×10): 1 via NASAL
  Filled 2022-01-05 (×3): qty 22

## 2022-01-05 MED ORDER — ASPIRIN 81 MG PO CHEW
81.0000 mg | CHEWABLE_TABLET | ORAL | Status: AC
  Administered 2022-01-06: 81 mg via ORAL
  Filled 2022-01-05: qty 1

## 2022-01-05 MED ORDER — PREDNISONE 20 MG PO TABS
50.0000 mg | ORAL_TABLET | Freq: Four times a day (QID) | ORAL | Status: AC
  Administered 2022-01-05 – 2022-01-06 (×3): 50 mg via ORAL
  Filled 2022-01-05 (×3): qty 1

## 2022-01-05 NOTE — Progress Notes (Signed)
[facility] ADULT ICU REPLACEMENT PROTOCOL   The patient does apply for the [facility] Adult ICU Electrolyte Replacment Protocol based on the criteria listed below:   1.Exclusion criteria: TCTS, ECMO, Dialysis, and Myasthenia Gravis patients 2. Is GFR >/= 30 ml/min? Yes.    Patient's GFR today is 49 3. Is SCr </= 2? Yes.   Patient's SCr is 1.74 mg/dL 4. Did SCr increase >/= 0.5 in 24 hours? No. 5.Pt's weight >40kg  Yes.   6. Abnormal electrolyte(s): K+3.4, Mag 1.8  7. Electrolytes replaced per protocol 8.  Call MD STAT for K+ </= 2.5, Phos </= 1, or Mag </= 1 Physician:  Dr. Willaim Bane, [place] Grumbling 01/05/2022 4:29 AM

## 2022-01-05 NOTE — Progress Notes (Addendum)
  Progress Note    01/05/2022 8:40 AM * No surgery found *  Subjective:  pain in abd and back has completely resolved   Vitals:   01/05/22 0815 01/05/22 0830  BP: 132/79 119/77  Pulse: 65 69  Resp: (!) 7 15  Temp:    SpO2: 100% 97%   Physical Exam: Lungs:  non labored Extremities:  palpable DP pulses Abdomen:  soft, NT Neurologic: A&O  CBC    Component Value Date/Time   WBC 17.6 (H) 01/05/2022 0321   RBC 4.30 01/05/2022 0321   HGB 12.6 (L) 01/05/2022 0321   HCT 35.8 (L) 01/05/2022 0321   PLT 334 01/05/2022 0321   MCV 83.3 01/05/2022 0321   MCH 29.3 01/05/2022 0321   MCHC 35.2 01/05/2022 0321   RDW 12.4 01/05/2022 0321   LYMPHSABS 3.5 06/18/2020 1226   MONOABS 1.1 (H) 06/18/2020 1226   EOSABS 0.2 06/18/2020 1226   BASOSABS 0.1 06/18/2020 1226    BMET    Component Value Date/Time   NA 135 01/05/2022 0321   NA 141 04/30/2021 1114   K 3.4 (L) 01/05/2022 0321   CL 100 01/05/2022 0321   CO2 23 01/05/2022 0321   GLUCOSE 277 (H) 01/05/2022 0321   BUN 23 (H) 01/05/2022 0321   BUN 17 04/30/2021 1114   CREATININE 1.74 (H) 01/05/2022 0321   CREATININE 0.91 09/15/2013 1727   CALCIUM 8.9 01/05/2022 0321   GFRNONAA 49 (L) 01/05/2022 0321   GFRAA >60 09/08/2019 1328    INR No results found for: "INR"   Intake/Output Summary (Last 24 hours) at 01/05/2022 0840 Last data filed at 01/05/2022 0800 Gross per 24 hour  Intake 1746.37 ml  Output --  Net 1746.37 ml     Assessment/Plan:  46 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 regimen today per primary team Plan will be to repeat CTA in about 4-6 weeks as long as pain does not return   Dagoberto Ligas, PA-C [facility] [phone] 01/05/2022 8:40 AM  I have independently interviewed and examined patient and agree with PA assessment and plan above.   Abdominal pain is fully resolved more than likely not related to penetrating aortic ulcer.  Okay for heparinization and discussed plans for cardiac catheterization with Dr. Angelena Form.  Given change in appearance of PACU we will plan to repeat CT scan in 4 to 6 weeks as outpatient.  Janayla Marik C. Donzetta Matters, MD [facility] Office: [phone] Pager: [phone]

## 2022-01-05 NOTE — Progress Notes (Signed)
[place] Progress Note Patient Name: Shaun Cummings DOB: September 22, 1975 MRN: [id number]   Date of Service  01/05/2022  HPI/Events of Note  Received query from bedside RN regarding the gtts.   BP now at 119/73, HR 65 on esmolol gtt at 145mg/kg/min (max upto 300) and cleviprex gtt at 359mhr (max 2160mr).    eICU Interventions  Advised RN to titrate cleviprex to off first if goal parameters are met.          VanElsie Lincoln/11/2021, 3:38 AM

## 2022-01-05 NOTE — Progress Notes (Signed)
ANTICOAGULATION CONSULT NOTE - Initial Consult  Pharmacy Consult for heparin gtt Indication: chest pain/ACS  Allergies  Allergen Reactions   Augmentin [Amoxicillin-Pot Clavulanate] Hives and Itching    Has patient had a PCN reaction causing immediate rash, facial/tongue/throat swelling, SOB or lightheadedness with hypotension: Yes Has patient had a PCN reaction causing severe rash involving mucus membranes or skin necrosis: No Has patient had a PCN reaction that required hospitalization: No Has patient had a PCN reaction occurring within the last 10 years: No If all of the above answers are "NO", then may proceed with Cephalosporin use.   Contrast Media [Iodinated Contrast Media] Hives    Pt broke out with 1 hive on forehead after CT injection-treated with 25mg  Benadryl    Patient Measurements: Height: 5\' 11"  (180.3 cm) Weight: 107.4 kg (236 lb 12.4 oz) IBW/kg (Calculated) : 75.3 Heparin Dosing Weight: 99.5 kg  Vital Signs: Temp: 98.1 F (36.7 C) (12/11 1204) Temp Source: Oral (12/11 1204) BP: 136/82 (12/11 1200) Pulse Rate: 68 (12/11 1204)  Labs: Recent Labs    01/04/22 2037 01/04/22 2209 01/05/22 0321 01/05/22 0654 01/05/22 0910  HGB 13.5  --  12.6*  --   --   HCT 38.8*  --  35.8*  --   --   PLT 426*  --  334  --   --   CREATININE 1.90*  --  1.74*  --   --   TROPONINIHS 19*   < > 1,719* 4,399* 7,294*   < > = values in this interval not displayed.    Estimated Creatinine Clearance: 67.5 mL/min (A) (by C-G formula based on SCr of 1.74 mg/dL (H)).   Medical History: Past Medical History:  Diagnosis Date   Chest pain    HTN (hypertension)    Hyperlipidemia    Lesion of penis    foreskin   OSA (obstructive sleep apnea)    per pt dx osa and used cpap but after losing wt. from 415 pounds down to 244 pounds no longer needs cpap   Peripheral neuropathy    Phimosis    Scrotal abscess 05/08/2017   Type 2 diabetes mellitus ([place])    per pt no meds for two years pt  was changed to levemir unable to afford but pt states is waiting on approval for a program he applied for that will pay for his meds ([place])      Assessment: 46 yo M with c/f NSTEMI with elevated troponins and EKG changes. Aortic findings are incidental, ok for bolus and normal goal per CCM, interventional cards, and vasc teams.   Goal of Therapy:  Heparin level 0.3-0.7 units/ml Monitor platelets by anticoagulation protocol: Yes   Plan:  Heparin 4000 u IV x1 as bolus then 1300 units/hr 6hr HL at 1800 Daily HL, CBC F/u cath plans   Wilson Singer, PharmD Clinical Pharmacist 01/05/2022 12:13 PM

## 2022-01-05 NOTE — Progress Notes (Signed)
[facility] Progress Note Patient Name: Shaun Cummings DOB: [birth year] MRN: [id number]   Date of Service  01/05/2022  HPI/Events of Note  46/M with HTN, presents with chest/abdominal pain and shortness of breath.   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   BP 159/104, HR 72, RR 12, O2 sats 96%.  Pt is awake and alert.   eICU Interventions  Continue with esmolol and clevidipne gtts to achieve goal.  Continue oral agents.  Goal is to keep SBP <120, HR 60-80.  Insulin for glucose control.  Dilaudid increased to 1mg  PRN.  Vascular surgery consulted.  SCDs for DVT prophylaxis.      Intervention Category Evaluation Type: New Patient Evaluation  Elsie Lincoln 01/05/2022, 1:40 AM

## 2022-01-05 NOTE — H&P (View-Only) (Signed)
Cardiology Consultation   Patient ID: Olumide Dolinger MRN: [id number]; DOB: 06-Oct-1975  Admit date: 01/04/2022 Date of Consult: 01/05/2022  PCP:  Iona Beard, MD   [place] Prov[facility] Cardiologist:  Freada Bergeron, MD     Patient Profile:   Shaun Cummings is a 46 y.o. male with a hx of HTN, HLD, DM, tobacco use and OSA who is being seen 01/05/2022 for the evaluation of elevated troponin at the request of Dr. Carlis Abbott.  History of Present Illness:   Shaun Cummings is a 46 year old male with past medical history noted above.  He was seen by Dr. Johney Frame as an outpatient 06/2020 as a referral from PCP for chest pain.  Did report family history of premature CAD with mother having PCI in her 59s and father having MI in his 24s.  Visit his chest pain was found to be atypical but was risk stratified with a calcium score noted at 59 which was 92nd percentile compared to gender and race.  He was started on Crestor 10 mg daily as well as valsartan 80 mg daily.  He presented to the ED on 12/10 with complaints of acute abdominal pain in the epigastric region radiating to his back with concerns for acute aortic syndrome. Reports he was sitting watching TV when he developed pain in his abd with radiation in the epigastric area. Presented to the ED with symptoms. Reports he had an episode a month prior which was similar while walking the college campus of his son's school.   Labs in the ED showed sodium 138, potassium 3.4, creatinine 1.9, high-sensitivity troponin 19>> 22>>1719>>4399>>7294, WBC 17.4, hemoglobin 13.5>>12.6.  Blood pressure over 681 systolic.  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  EKG on admission showed sinus rhythm, 85 bpm nonspecific changes.  Seen by Dr. Donzetta Matters, VVS with recommendations for pain control as  well as blood pressure management.  He was admitted to PCCM, still on esmolol and Cleviprex drip.  Also started on oral hydralazine.  Admitted to ICU.  He was able to be weaned from Cleviprex drip.  When additional high-sensitivity troponin cycled and noted at 1700 and nineteen 2D echocardiogram was ordered.  Echo 12/11 with LVEF of 35 to 27%, grade 1 diastolic dysfunction, global hypokinesis but also severe focal hypokinesis of the basal to mid inferior wall, moderately dilated left atrium, mildly dilated right atrium, no significant valvular disease.  Repeat EKG 12/11 shows sinus rhythm, 66 bpm, T wave inversion in lateral leads, prolonged QT 478, isolated Q-wave in lead III.   Cardiology now asked to evaluate. In talking with the patient, he reports following with a PCP. Recently had his BP meds adjusted to amlodipine-olmesartan 10-20mg  daily. Says he has not noticed a significant improvement in BP still runs quite high at home. 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 Stopped smoking 3 weeks ago.   Past Medical History:  Diagnosis Date   Chest pain    HTN (hypertension)    Hyperlipidemia    Lesion of penis    foreskin   OSA (obstructive sleep apnea)    per pt dx osa and used cpap but after losing wt. from 415 pounds down to 244 pounds no longer needs cpap   Peripheral neuropathy    Phimosis    Scrotal abscess 05/08/2017   Type 2 diabetes mellitus ([place])  per pt no meds for two years pt was changed to levemir unable to afford but pt states is waiting on approval for a program he applied for that will pay for his meds ([place])      Past Surgical History:  Procedure Laterality Date   CIRCUMCISION N/A 02/10/2016   Procedure: CIRCUMCISION ADULT;  Surgeon: Cleon Gustin, MD;  Location: [facility];  Service: Urology;  Laterality: N/A;   INCISION AND DRAINAGE ABSCESS Right 05/12/2017   Procedure: INCISION AND DRAINAGE RIGHT INGUINAL  ABSCESS;  Surgeon: Erroll Luna, MD;  Location: [place];  Service: General;  Laterality: Right;   INCISION AND DRAINAGE ABSCESS N/A 03/24/2018   Procedure: INCISION AND DRAINAGE POSTERIOR NECK ABSCESS;  Surgeon: Excell Seltzer, MD;  Location: [place];  Service: General;  Laterality: N/A;   INCISION AND DRAINAGE ABSCESS Right 06/19/2020   Procedure: INCISION AND DRAINAGE ABSCESS, RIGHT GROIN;  Surgeon: Johnathan Hausen, MD;  Location: [facility];  Service: General;  Laterality: Right;   INCISION AND DRAINAGE PERIRECTAL ABSCESS Right 05/09/2017   Procedure: IRRIGATION AND DEBRIDEMENT PERINEAL ABSCESS;  Surgeon: Donnie Mesa, MD;  Location: [facility];  Service: General;  Laterality: Right;     Home Medications:  Prior to Admission medications   Medication Sig Start Date End Date Taking? Authorizing Provider  amlodipine-olmesartan (AZOR) 10-20 MG tablet Take 1 tablet by mouth daily. 12/30/21 03/30/22 Yes Iona Beard, MD  empagliflozin (JARDIANCE) 10 MG TABS tablet Take 1 tablet (10 mg total) by mouth daily before breakfast. 12/30/21 12/25/22 Yes Iona Beard, MD  glipiZIDE (GLUCOTROL) 5 MG tablet Take 1 tablet (5 mg total) by mouth daily before breakfast. 05/21/21 02/15/22 Yes Gaylan Gerold, DO  predniSONE (DELTASONE) 50 MG tablet Take 1 tablet (50 mg total) by mouth as directed. Please take 1 tablet 13 hours, 7 hours, and 1 hour prior to CT hematuria scan to prevent contrast reaction Patient not taking: Reported on 01/04/2022 12/22/21   Iona Beard, MD  Continuous Blood Gluc Sensor (FREESTYLE LIBRE 3 SENSOR) MISC 1 Units by Does not apply route every 14 (fourteen) days. Place 1 sensor on the skin every 14 days. Use to check glucose continuously 12/30/21   Iona Beard, MD    Inpatient Medications: Scheduled Meds:  amLODipine  10 mg Oral Daily   carvedilol  6.25 mg Oral BID WC   Chlorhexidine Gluconate Cloth  6 each Topical Daily   hydrALAZINE  50 mg Oral Q8H   insulin aspart  0-15 Units  Subcutaneous TID WC   insulin aspart  0-5 Units Subcutaneous QHS   insulin glargine-yfgn  5 Units Subcutaneous Daily   mupirocin ointment  1 Application Nasal BID   Continuous Infusions:  diphenhydrAMINE     esmolol 150 mcg/kg/min (01/05/22 1129)   PRN Meds: acetaminophen, diphenhydrAMINE, docusate sodium, HYDROmorphone (DILAUDID) injection, naLOXone (NARCAN)  injection, mouth rinse, polyethylene glycol  Allergies:    Allergies  Allergen Reactions   Augmentin [Amoxicillin-Pot Clavulanate] Hives and Itching    Has patient had a PCN reaction causing immediate rash, facial/tongue/throat swelling, SOB or lightheadedness with hypotension: Yes Has patient had a PCN reaction causing severe rash involving mucus membranes or skin necrosis: No Has patient had a PCN reaction that required hospitalization: No Has patient had a PCN reaction occurring within the last 10 years: No If all of the above answers are "NO", then may proceed with Cephalosporin use.   Contrast Media [Iodinated Contrast Media] Hives    Pt broke out with  1 hive on forehead after CT injection-treated with 25mg  Benadryl    Social History:   Social History   Socioeconomic History   Marital status: Married    Spouse name: Not on file   Number of children: 2   Years of education: college    Highest education level: Not on file  Occupational History   Occupation: [employer]   Tobacco Use   Smoking status: Former    Years: 2.00    Types: Cigarettes    Quit date: 02/05/2008    Years since quitting: 13.9   Smokeless tobacco: Never  Vaping Use   Vaping Use: Never used  Substance and Sexual Activity   Alcohol use: No   Drug use: No   Sexual activity: Not on file  Other Topics Concern   Not on file  Social History Narrative   Lives with wife and 2 kids, age 65 and 52.    Social Determinants of Health   Financial Resource Strain: Not on file  Food Insecurity: Not on file  Transportation Needs: Not on file   Physical Activity: Not on file  Stress: Not on file  Social Connections: Not on file  Intimate Partner Violence: Not on file    Family History:    Family History  Problem Relation Age of Onset   Diabetes Mother    Angina Mother    CAD Mother    Heart disease Mother    Glaucoma Father    Diabetes Father    Heart attack Father    Kidney failure Father    Diabetes Brother    Glaucoma Maternal Grandmother    Diabetes Maternal Grandmother    Heart disease Maternal Grandmother    Glaucoma Maternal Grandfather    Diabetes Maternal Grandfather    Heart disease Maternal Grandfather    Glaucoma Paternal Grandmother    Diabetes Paternal Grandmother    Glaucoma Paternal Grandfather    Diabetes Paternal Grandfather    Asthma Son      ROS:  Please see the history of present illness.   All other ROS reviewed and negative.     Physical Exam/Data:   Vitals:   01/05/22 1015 01/05/22 1030 01/05/22 1045 01/05/22 1100  BP: 126/76 134/76 126/74 133/82  Pulse: 73 70 68 69  Resp: 14 14 (!) 21 18  Temp:      TempSrc:      SpO2: 96% 98% 94% 98%  Weight:      Height:        Intake/Output Summary (Last 24 hours) at 01/05/2022 1134 Last data filed at 01/05/2022 1100 Gross per 24 hour  Intake 2080.23 ml  Output 800 ml  Net 1280.23 ml      01/05/2022    5:00 AM 01/04/2022    8:09 PM 12/30/2021   10:48 AM  Last 3 Weights  Weight (lbs) 236 lb 12.4 oz 247 lb 247 lb 8 oz  Weight (kg) 107.4 kg 112.038 kg 112.265 kg     Body mass index is 33.02 kg/m.  General:  Well nourished, well developed, in no acute distress HEENT: normal Neck: no JVD Vascular: No carotid bruits; Distal pulses 2+ bilaterally Cardiac:  normal S1, S2; RRR; no murmur  Lungs:  clear to auscultation bilaterally, no wheezing, rhonchi or rales  Abd: soft, nontender, no hepatomegaly  Ext: no edema Musculoskeletal:  No deformities, BUE and BLE strength normal and equal Skin: warm and dry  Neuro:  CNs 2-12  intact, no focal abnormalities  noted Psych:  Normal affect   EKG:  The EKG was personally reviewed and demonstrates:    12/10 sinus rhythm, 85 bpm nonspecific changes  12/11 sinus rhythm, 66 bpm, T wave inversion in lateral leads, prolonged QT 478, isolated Q-wave in lead III.   Telemetry:  Telemetry was personally reviewed and demonstrates:  Sinus Rhythm  Relevant CV Studies:  Echo: 01/05/2022  IMPRESSIONS     1. Left ventricular ejection fraction, by estimation, is 35 to 40%. The  left ventricle has moderately decreased function. The left ventricle  demonstrates regional wall motion abnormalities (see scoring  diagram/findings for description). The left  ventricular internal cavity size was mildly dilated. Left ventricular  diastolic parameters are consistent with Grade I diastolic dysfunction  (impaired relaxation). There is hypokinesis of the left ventricular,  septal wall, anterior wall and lateral wall.   There is severe hypokinesis of the left ventricular, basal-mid inferior  wall.   2. Right ventricular systolic function is normal. The right ventricular  size is normal.   3. Left atrial size was moderately dilated.   4. Right atrial size was mildly dilated.   5. The mitral valve is normal in structure. Trivial mitral valve  regurgitation. No evidence of mitral stenosis.   6. The aortic valve is grossly normal. Aortic valve regurgitation is not  visualized. No aortic stenosis is present.   7. The inferior vena cava is normal in size with greater than 50%  respiratory variability, suggesting right atrial pressure of 3 mmHg.   Comparison(s): No prior Echocardiogram.   Conclusion(s)/Recommendation(s): Reduced LVEF, with global hypokinesis but  also severe focal hypokinesis of basal to mid inferior wall. Results will  be communicated to primary team.   FINDINGS   Left Ventricle: Left ventricular ejection fraction, by estimation, is 35  to 40%. The left ventricle has  moderately decreased function. The left  ventricle demonstrates regional wall motion abnormalities. Severe  hypokinesis of the left ventricular,  basal-mid inferior wall. The left ventricular internal cavity size was  mildly dilated. There is borderline left ventricular hypertrophy. Left  ventricular diastolic parameters are consistent with Grade I diastolic  dysfunction (impaired relaxation).   Right Ventricle: The right ventricular size is normal. No increase in  right ventricular wall thickness. Right ventricular systolic function is  normal.   Left Atrium: Left atrial size was moderately dilated.   Right Atrium: Right atrial size was mildly dilated.   Pericardium: There is no evidence of pericardial effusion.   Mitral Valve: The mitral valve is normal in structure. Trivial mitral  valve regurgitation. No evidence of mitral valve stenosis.   Tricuspid Valve: The tricuspid valve is normal in structure. Tricuspid  valve regurgitation is trivial. No evidence of tricuspid stenosis.   Aortic Valve: The aortic valve is grossly normal. Aortic valve  regurgitation is not visualized. No aortic stenosis is present.   Pulmonic Valve: The pulmonic valve was grossly normal. Pulmonic valve  regurgitation is not visualized. No evidence of pulmonic stenosis.   Aorta: The aortic root, ascending aorta and aortic arch are all  structurally normal, with no evidence of dilitation or obstruction.   Venous: The inferior vena cava is normal in size with greater than 50%  respiratory variability, suggesting right atrial pressure of 3 mmHg.   IAS/Shunts: The atrial septum is grossly normal.    Laboratory Data:  High Sensitivity Troponin:   Recent Labs  Lab 01/04/22 2037 01/04/22 2209 01/05/22 0321 01/05/22 0654 01/05/22 0910  TROPONINIHS 19* 22*  1,719* 4,399* 7,294*     Chemistry Recent Labs  Lab 01/04/22 2037 01/05/22 0321  NA 138 135  K 3.4* 3.4*  CL 102 100  CO2 22 23  GLUCOSE  242* 277*  BUN 24* 23*  CREATININE 1.90* 1.74*  CALCIUM 9.0 8.9  MG  --  1.8  GFRNONAA 44* 49*  ANIONGAP 14 12    Recent Labs  Lab 01/04/22 2037  PROT 7.4  ALBUMIN 3.1*  AST 17  ALT 14  ALKPHOS 81  BILITOT 0.3   Lipids No results for input(s): "CHOL", "TRIG", "HDL", "LABVLDL", "LDLCALC", "CHOLHDL" in the last 168 hours.  Hematology Recent Labs  Lab 01/04/22 2037 01/05/22 0321  WBC 17.4* 17.6*  RBC 4.57 4.30  HGB 13.5 12.6*  HCT 38.8* 35.8*  MCV 84.9 83.3  MCH 29.5 29.3  MCHC 34.8 35.2  RDW 12.6 12.4  PLT 426* 334   Thyroid No results for input(s): "TSH", "FREET4" in the last 168 hours.  BNPNo results for input(s): "BNP", "PROBNP" in the last 168 hours.  DDimer No results for input(s): "DDIMER" in the last 168 hours.   Radiology/Studies:  ECHOCARDIOGRAM COMPLETE  Result Date: 01/05/2022    ECHOCARDIOGRAM REPORT   Patient Name:   Shaun Cummings Date of Exam: 01/05/2022 Medical Rec #:  [id number]     Height:       71.0 in Accession #:    [id number]    Weight:       236.8 lb Date of Birth:  03/26/1975    BSA:          2.265 m Patient Age:    89 years      BP:           132/87 mmHg Patient Gender: M             HR:           68 bpm. Exam Location:  Inpatient Procedure: 2D Echo, Color Doppler and Cardiac Doppler STAT ECHO Indications:    abdominal aorta dissection  History:        Patient has no prior history of Echocardiogram examinations.                 Signs/Symptoms:elevated troponin; Risk Factors:Diabetes,                 Hypertension, Dyslipidemia, Sleep Apnea and Former Smoker.  Sonographer:    Johny Chess RDCS Referring Phys: [id number] VANESSA YAP IMPRESSIONS  1. Left ventricular ejection fraction, by estimation, is 35 to 40%. The left ventricle has moderately decreased function. The left ventricle demonstrates regional wall motion abnormalities (see scoring diagram/findings for description). The left ventricular internal cavity size was mildly dilated. Left  ventricular diastolic parameters are consistent with Grade I diastolic dysfunction (impaired relaxation). There is hypokinesis of the left ventricular, septal wall, anterior wall and lateral wall.  There is severe hypokinesis of the left ventricular, basal-mid inferior wall.  2. Right ventricular systolic function is normal. The right ventricular size is normal.  3. Left atrial size was moderately dilated.  4. Right atrial size was mildly dilated.  5. The mitral valve is normal in structure. Trivial mitral valve regurgitation. No evidence of mitral stenosis.  6. The aortic valve is grossly normal. Aortic valve regurgitation is not visualized. No aortic stenosis is present.  7. The inferior vena cava is normal in size with greater than 50% respiratory variability, suggesting right atrial pressure of 3 mmHg. Comparison(s): No prior Echocardiogram. Conclusion(s)/Recommendation(s): Reduced  LVEF, with global hypokinesis but also severe focal hypokinesis of basal to mid inferior wall. Results will be communicated to primary team. FINDINGS  Left Ventricle: Left ventricular ejection fraction, by estimation, is 35 to 40%. The left ventricle has moderately decreased function. The left ventricle demonstrates regional wall motion abnormalities. Severe hypokinesis of the left ventricular, basal-mid inferior wall. The left ventricular internal cavity size was mildly dilated. There is borderline left ventricular hypertrophy. Left ventricular diastolic parameters are consistent with Grade I diastolic dysfunction (impaired relaxation). Right Ventricle: The right ventricular size is normal. No increase in right ventricular wall thickness. Right ventricular systolic function is normal. Left Atrium: Left atrial size was moderately dilated. Right Atrium: Right atrial size was mildly dilated. Pericardium: There is no evidence of pericardial effusion. Mitral Valve: The mitral valve is normal in structure. Trivial mitral valve  regurgitation. No evidence of mitral valve stenosis. Tricuspid Valve: The tricuspid valve is normal in structure. Tricuspid valve regurgitation is trivial. No evidence of tricuspid stenosis. Aortic Valve: The aortic valve is grossly normal. Aortic valve regurgitation is not visualized. No aortic stenosis is present. Pulmonic Valve: The pulmonic valve was grossly normal. Pulmonic valve regurgitation is not visualized. No evidence of pulmonic stenosis. Aorta: The aortic root, ascending aorta and aortic arch are all structurally normal, with no evidence of dilitation or obstruction. Venous: The inferior vena cava is normal in size with greater than 50% respiratory variability, suggesting right atrial pressure of 3 mmHg. IAS/Shunts: The atrial septum is grossly normal.  LEFT VENTRICLE PLAX 2D LVIDd:         5.75 cm      Diastology LVIDs:         4.70 cm      LV e' medial:    5.91 cm/s LV PW:         1.10 cm      LV E/e' medial:  13.8 LV IVS:        1.20 cm      LV e' lateral:   7.15 cm/s LVOT diam:     2.30 cm      LV E/e' lateral: 11.4 LV SV:         70 LV SV Index:   31 LVOT Area:     4.15 cm  LV Volumes (MOD) LV vol d, MOD A4C: 176.0 ml LV vol s, MOD A4C: 110.0 ml LV SV MOD A4C:     176.0 ml RIGHT VENTRICLE             IVC RV Basal diam:  2.70 cm     IVC diam: 2.10 cm RV S prime:     13.40 cm/s TAPSE (M-mode): 2.8 cm LEFT ATRIUM             Index        RIGHT ATRIUM           Index LA diam:        4.20 cm 1.85 cm/m   RA Area:     18.10 cm LA Vol (A2C):   68.1 ml 30.06 ml/m  RA Volume:   49.60 ml  21.89 ml/m LA Vol (A4C):   85.8 ml 37.87 ml/m LA Biplane Vol: 82.3 ml 36.33 ml/m  AORTIC VALVE LVOT Vmax:   75.90 cm/s LVOT Vmean:  51.400 cm/s LVOT VTI:    0.168 m  AORTA Ao Asc diam: 3.40 cm MITRAL VALVE MV Area (PHT): 5.66 cm     SHUNTS MV Decel Time: 134 msec  Systemic VTI:  0.17 m MV E velocity: 81.30 cm/s   Systemic Diam: 2.30 cm MV A velocity: 101.00 cm/s MV E/A ratio:  0.80 Shaun Dresser MD  Electronically signed by Shaun Dresser MD Signature Date/Time: 01/05/2022/8:38:23 AM    Final    DG Chest 2 View  Result Date: 01/04/2022 CLINICAL DATA:  Chest pain EXAM: CHEST - 2 VIEW COMPARISON:  CT from earlier in the same day. FINDINGS: The heart size and mediastinal contours are within normal limits. Both lungs are clear. The visualized skeletal structures are unremarkable. IMPRESSION: No active cardiopulmonary disease. Electronically Signed   By: Inez Catalina M.D.   On: 01/04/2022 22:48   CT Angio Chest/Abd/Pel for Dissection W and/or Wo Contrast  Result Date: 01/04/2022 CLINICAL DATA:  Acute aortic syndrome suspected. EXAM: CT ANGIOGRAPHY CHEST, ABDOMEN AND PELVIS TECHNIQUE: Non-contrast CT of the chest was initially obtained. Multidetector CT imaging through the chest, abdomen and pelvis was performed using the standard protocol during bolus administration of intravenous contrast. Multiplanar reconstructed images and MIPs were obtained and reviewed to evaluate the vascular anatomy. RADIATION DOSE REDUCTION: This exam was performed according to the departmental dose-optimization program which includes automated exposure control, adjustment of the mA and/or kV according to patient size and/or use of iterative reconstruction technique. CONTRAST:  131mL OMNIPAQUE IOHEXOL 350 MG/ML SOLN COMPARISON:  06/18/2020. FINDINGS: CTA CHEST FINDINGS Cardiovascular: The heart is mildly enlarged and there is no pericardial effusion. Scattered coronary artery calcifications are present. The aorta is normal in caliber without evidence aneurysm or dissection. The pulmonary trunk is normal in caliber. Mediastinum/Nodes: No mediastinal, hilar, or axillary lymphadenopathy. The thyroid gland, trachea, and esophagus are within normal limits. Lungs/Pleura: Dependent atelectasis is noted bilaterally. No effusion or pneumothorax. Musculoskeletal: No chest wall abnormality. No acute osseous abnormality. Review of the  MIP images confirms the above findings. CTA ABDOMEN AND PELVIS FINDINGS VASCULAR Aorta: Aortic atherosclerosis with mural thrombus. There is a small penetrating ulcer on the left in the infrarenal abdominal aorta measuring 3 mm, coronal image 78. No intramural hematoma. Normal caliber aorta without aneurysm, dissection, vasculitis or significant stenosis. Celiac: Patent without evidence of aneurysm, dissection, vasculitis or significant stenosis. SMA: Patent without evidence of aneurysm, dissection, vasculitis or significant stenosis. Renals: Both renal arteries are patent without evidence of aneurysm, dissection, vasculitis, fibromuscular dysplasia or significant stenosis. IMA: Patent. Inflow: Patent without evidence of aneurysm, dissection, vasculitis or significant stenosis. Veins: No obvious venous abnormality within the limitations of this arterial phase study. Review of the MIP images confirms the above findings. NON-VASCULAR Hepatobiliary: No focal liver abnormality is seen. No gallstones, gallbladder wall thickening, or biliary dilatation. Pancreas: Unremarkable. No pancreatic ductal dilatation or surrounding inflammatory changes. Spleen: Normal in size without focal abnormality. Adrenals/Urinary Tract: The adrenal glands are within normal limits. Nonobstructive left renal calculi. No hydronephrosis bilaterally. The bladder is unremarkable. Stomach/Bowel: Stomach is within normal limits. Appendix appears normal. No evidence of bowel wall thickening, distention, or inflammatory changes. No effusion or pneumothorax. Scattered diverticula along the colon without evidence of diverticulitis. Lymphatic: No abdominal or pelvic lymphadenopathy. Reproductive: Mildly enlarged prostate gland. Other: No abdominopelvic ascites.  Fat containing umbilical hernia. Musculoskeletal: Degenerative changes are present in the lumbar spine. No acute osseous abnormality. Review of the MIP images confirms the above findings.  IMPRESSION: 1. No evidence aortic aneurysm or dissection. 2. 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 No mural hematoma. 3. Nonobstructive  left renal calculi. 4. Cardiomegaly with coronary artery calcifications. 5. Colonic diverticulosis without diverticulitis. Electronically Signed   By: Brett Fairy M.D.   On: 01/04/2022 21:57   VAS [place] LOWER EXTREMITY VENOUS (DVT)  Result Date: 01/02/2022  Lower Venous DVT Study Patient Name:  Shaun Cummings  Date of Exam:   01/02/2022 Medical Rec #: [id number]      Accession #:    [id number] Date of Birth: November 22, 1975     Patient Gender: M Patient Age:   37 years Exam Location:  [facility] Procedure:      VAS [place] LOWER EXTREMITY VENOUS (DVT) Referring Phys: Lalla Brothers --------------------------------------------------------------------------------  Indications: Swelling. Other Indications: History of pain and swelling in right leg lateral side. Performing Technologist: Oda Cogan RDMS, RVT  Examination Guidelines: A complete evaluation includes B-mode imaging, spectral Doppler, color Doppler, and power Doppler as needed of all accessible portions of each vessel. Bilateral testing is considered an integral part of a complete examination. Limited examinations for reoccurring indications may be performed as noted. The reflux portion of the exam is performed with the patient in reverse Trendelenburg.  +---------+---------------+---------+-----------+----------+--------------+ RIGHT    CompressibilityPhasicitySpontaneityPropertiesThrombus Aging +---------+---------------+---------+-----------+----------+--------------+ CFV      Full           Yes      Yes                                 +---------+---------------+---------+-----------+----------+--------------+ SFJ      Full                                                         +---------+---------------+---------+-----------+----------+--------------+ FV Prox  Full                                                        +---------+---------------+---------+-----------+----------+--------------+ FV Mid   Full                                                        +---------+---------------+---------+-----------+----------+--------------+ FV DistalFull                                                        +---------+---------------+---------+-----------+----------+--------------+ PFV      Full                                                        +---------+---------------+---------+-----------+----------+--------------+ POP      Full           Yes      Yes                                 +---------+---------------+---------+-----------+----------+--------------+  PTV      Full                                                        +---------+---------------+---------+-----------+----------+--------------+ PERO     Full                                                        +---------+---------------+---------+-----------+----------+--------------+   +----+---------------+---------+-----------+----------+--------------+ LEFTCompressibilityPhasicitySpontaneityPropertiesThrombus Aging +----+---------------+---------+-----------+----------+--------------+ CFV Full           Yes      Yes                                 +----+---------------+---------+-----------+----------+--------------+ SFJ Full                                                        +----+---------------+---------+-----------+----------+--------------+     Summary: RIGHT: - There is no evidence of deep vein thrombosis in the lower extremity.  - No cystic structure found in the popliteal fossa.  LEFT: - No evidence of common femoral vein obstruction.  *See table(s) above for measurements and observations. Electronically signed by Servando Snare MD on 01/02/2022  at 5:16:22 PM.    Final      Assessment and Plan:   Shaun Cummings is a 46 y.o. male with a hx of HTN, HLD, DM and OSA who is being seen 01/05/2022 for the evaluation of elevated troponin at the request of Dr. Carlis Abbott.  NSTEMI (demand vs ACS) HFrEF -- hsTn initially 19, continues to trend up with last value 7294. EKG with TWI in lateral leads, q waves in lead III.  -- CT a/p with coronary calcifications noted -- Echo notes LVEF of 35-40%, global hypokinesis but more severe in basal-inferior wall -- he denies any anginal symptoms PTA, does have strong family hx of CAD with mother having PCI and father MI 49's -- ideally would heparinize but will review with MD given abd aortic ulcer -- will need further work up with ischemic evaluation with cardiac cath if MD agrees -- serial EKGs, continue to cycle troponin -- GDMT: now on coreg, hydralazine. Unable to add ARB/ANRi presently with elevated Cr. Will resume jardiance   Hypertensive emergency - Systolic BP greater than 253 on admission, initially treated with Cleviprex and esmolol drip.  Has been weaned from Cleviprex. Esmolol drip to be weaned -- Continue amlodipine, hydralazine, Coreg. Increase hydralazine to 75mg  TID, coreg to 12.5mg  BID  Penetrating abdominal aortic ulcer --Initially presented with abdominal/back pain.  Mural thrombus of abdominal aorta with small penetrating ulcer in the infrarenal abdominal aorta measuring 3 mm with no mural hematoma. -- Evaluated by [facility] with recommendations for pain management and blood pressure control, repeat scan 4 to 6 weeks as long as no recurrent pain.  AKI in the setting of hypertensive emergency -- Cr 1.90>>1.74  -- follow BMET   DM -- Hgb A1c 11.5 11/2021 -- on SSI while  inpatient -- jardiance/glipizide outpatient  Prior tobacco use -- reports cessation 3 weeks ago  Risk Assessment/Risk Scores:    TIMI Risk Score for Unstable Angina or Non-ST Elevation MI:   The patient's TIMI risk  score is 2, which indicates a 8% risk of all cause mortality, new or recurrent myocardial infarction or need for urgent revascularization in the next 14 days.{  For questions or updates, please contact [place] Please consult [email or web address] for contact info under    Signed, Reino Bellis, NP  01/05/2022 11:34 AM   Addendum: discussed with MD, will check with VVS to ensure ok to heparinize as it appears area of ulceration/thrombus could be chronic finding, low concern for bleeding. Will proceed with cardiac cath tomorrow to allow to wean esmolol.  -- will order contrast dye prophylaxis   I have personally seen and examined this patient. I agree with the assessment and plan as outlined above.  46 yo male with HTN, HLD, DM and sleep apnea who is admitted with hypertensive urgency and epigastric pain. CTA with no evidence of aortic dissection. 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 Echo with LVEF=35-40$. EKG with non-specific changes. Troponin elevated over 7000.  No pain today.  Vascular surgery has seen him and there is no urgent concern about the ulcer in the plaque in his abd aorta.  My exam: I agree with above, in addition: RRR, no murmurs. Lungs clear. Ext: no LE edema Labs reviewed.  Contrast allergy noted Echo reviewed by me Plan: NSTEMI: Cardiac cath is indicated. I have reviewed this with the patient and his wife. Will make him NPO at midnight. Cardiac cath tomorrow. His blood pressure is much better on esmolol drip. Hopefully, this can be weaned off today. I would start IV heparin (plan discussed with Vascular surgery team). Continue ASA, statin and beta blocker.    I have reviewed the risks, indications, and alternatives to cardiac catheterization, possible angioplasty, and stenting with the patient. Risks include but are not limited to bleeding, infection, vascular injury, stroke, myocardial infection, arrhythmia, kidney injury,  radiation-related injury in the case of prolonged fluoroscopy use, emergency cardiac surgery, and death. The patient understands the risks of serious complication is 1-2 in 3888 with diagnostic cardiac cath and 1-2% or less with angioplasty/stenting.   Lauree Chandler, MD, [facility] 01/05/2022 12:24 PM

## 2022-01-05 NOTE — Consult Note (Addendum)
Cardiology Consultation   Patient ID: Shaun Cummings MRN: [id number]; DOB: 17-Sep-1975  Admit date: 01/04/2022 Date of Consult: 01/05/2022  PCP:  Iona Beard, MD   [facility] Cardiologist:  Freada Bergeron, MD     Patient Profile:   Shaun Cummings is a 46 y.o. male with a hx of HTN, HLD, DM, tobacco use and OSA who is being seen 01/05/2022 for the evaluation of elevated troponin at the request of Dr. Carlis Abbott.  History of Present Illness:   Mr. Shaun Cummings is a 46 year old male with past medical history noted above.  He was seen by Dr. Johney Frame as an outpatient 06/2020 as a referral from PCP for chest pain.  Did report family history of premature CAD with mother having PCI in her 10s and father having MI in his 48s.  Visit his chest pain was found to be atypical but was risk stratified with a calcium score noted at 15 which was 92nd percentile compared to gender and race.  He was started on Crestor 10 mg daily as well as valsartan 80 mg daily.  He presented to the ED on 12/10 with complaints of acute abdominal pain in the epigastric region radiating to his back with concerns for acute aortic syndrome. Reports he was sitting watching TV when he developed pain in his abd with radiation in the epigastric area. Presented to the ED with symptoms. Reports he had an episode a month prior which was similar while walking the college campus of his son's school.   Labs in the ED showed sodium 138, potassium 3.4, creatinine 1.9, high-sensitivity troponin 19>> 22>>1719>>4399>>7294, WBC 17.4, hemoglobin 13.5>>12.6.  Blood pressure over 660 systolic.  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  EKG on admission showed sinus rhythm, 85 bpm nonspecific changes.  Seen by Dr. Donzetta Matters, VVS with recommendations for pain control as  well as blood pressure management.  He was admitted to PCCM, still on esmolol and Cleviprex drip.  Also started on oral hydralazine.  Admitted to ICU.  He was able to be weaned from Cleviprex drip.  When additional high-sensitivity troponin cycled and noted at 1700 and nineteen 2D echocardiogram was ordered.  Echo 12/11 with LVEF of 35 to 63%, grade 1 diastolic dysfunction, global hypokinesis but also severe focal hypokinesis of the basal to mid inferior wall, moderately dilated left atrium, mildly dilated right atrium, no significant valvular disease.  Repeat EKG 12/11 shows sinus rhythm, 66 bpm, T wave inversion in lateral leads, prolonged QT 478, isolated Q-wave in lead III.   Cardiology now asked to evaluate. In talking with the patient, he reports following with a PCP. Recently had his BP meds adjusted to amlodipine-olmesartan 10-20mg  daily. Says he has not noticed a significant improvement in BP still runs quite high at home. 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 Stopped smoking 3 weeks ago.   Past Medical History:  Diagnosis Date   Chest pain    HTN (hypertension)    Hyperlipidemia    Lesion of penis    foreskin   OSA (obstructive sleep apnea)    per pt dx osa and used cpap but after losing wt. from 415 pounds down to 244 pounds no longer needs cpap   Peripheral neuropathy    Phimosis    Scrotal abscess 05/08/2017   Type 2 diabetes mellitus ([place])  per pt no meds for two years pt was changed to levemir unable to afford but pt states is waiting on approval for a program he applied for that will pay for his meds ([place])      Past Surgical History:  Procedure Laterality Date   CIRCUMCISION N/A 02/10/2016   Procedure: CIRCUMCISION ADULT;  Surgeon: Cleon Gustin, MD;  Location: [facility];  Service: Urology;  Laterality: N/A;   INCISION AND DRAINAGE ABSCESS Right 05/12/2017   Procedure: INCISION AND DRAINAGE RIGHT INGUINAL  ABSCESS;  Surgeon: Erroll Luna, MD;  Location: [facility];  Service: General;  Laterality: Right;   INCISION AND DRAINAGE ABSCESS N/A 03/24/2018   Procedure: INCISION AND DRAINAGE POSTERIOR NECK ABSCESS;  Surgeon: Excell Seltzer, MD;  Location: [facility];  Service: General;  Laterality: N/A;   INCISION AND DRAINAGE ABSCESS Right 06/19/2020   Procedure: INCISION AND DRAINAGE ABSCESS, RIGHT GROIN;  Surgeon: Johnathan Hausen, MD;  Location: [facility];  Service: General;  Laterality: Right;   INCISION AND DRAINAGE PERIRECTAL ABSCESS Right 05/09/2017   Procedure: IRRIGATION AND DEBRIDEMENT PERINEAL ABSCESS;  Surgeon: Donnie Mesa, MD;  Location: [place];  Service: General;  Laterality: Right;     Home Medications:  Prior to Admission medications   Medication Sig Start Date End Date Taking? Authorizing Provider  amlodipine-olmesartan (AZOR) 10-20 MG tablet Take 1 tablet by mouth daily. 12/30/21 03/30/22 Yes Iona Beard, MD  empagliflozin (JARDIANCE) 10 MG TABS tablet Take 1 tablet (10 mg total) by mouth daily before breakfast. 12/30/21 12/25/22 Yes Iona Beard, MD  glipiZIDE (GLUCOTROL) 5 MG tablet Take 1 tablet (5 mg total) by mouth daily before breakfast. 05/21/21 02/15/22 Yes Gaylan Gerold, DO  predniSONE (DELTASONE) 50 MG tablet Take 1 tablet (50 mg total) by mouth as directed. Please take 1 tablet 13 hours, 7 hours, and 1 hour prior to CT hematuria scan to prevent contrast reaction Patient not taking: Reported on 01/04/2022 12/22/21   Iona Beard, MD  Continuous Blood Gluc Sensor (FREESTYLE LIBRE 3 SENSOR) MISC 1 Units by Does not apply route every 14 (fourteen) days. Place 1 sensor on the skin every 14 days. Use to check glucose continuously 12/30/21   Iona Beard, MD    Inpatient Medications: Scheduled Meds:  amLODipine  10 mg Oral Daily   carvedilol  6.25 mg Oral BID WC   Chlorhexidine Gluconate Cloth  6 each Topical Daily   hydrALAZINE  50 mg Oral Q8H   insulin aspart  0-15 Units  Subcutaneous TID WC   insulin aspart  0-5 Units Subcutaneous QHS   insulin glargine-yfgn  5 Units Subcutaneous Daily   mupirocin ointment  1 Application Nasal BID   Continuous Infusions:  diphenhydrAMINE     esmolol 150 mcg/kg/min (01/05/22 1129)   PRN Meds: acetaminophen, diphenhydrAMINE, docusate sodium, HYDROmorphone (DILAUDID) injection, naLOXone (NARCAN)  injection, mouth rinse, polyethylene glycol  Allergies:    Allergies  Allergen Reactions   Augmentin [Amoxicillin-Pot Clavulanate] Hives and Itching    Has patient had a PCN reaction causing immediate rash, facial/tongue/throat swelling, SOB or lightheadedness with hypotension: Yes Has patient had a PCN reaction causing severe rash involving mucus membranes or skin necrosis: No Has patient had a PCN reaction that required hospitalization: No Has patient had a PCN reaction occurring within the last 10 years: No If all of the above answers are "NO", then may proceed with Cephalosporin use.   Contrast Media [Iodinated Contrast Media] Hives    Pt broke out with  1 hive on forehead after CT injection-treated with 25mg  Benadryl    Social History:   Social History   Socioeconomic History   Marital status: Married    Spouse name: Not on file   Number of children: 2   Years of education: college    Highest education level: Not on file  Occupational History   Occupation: [employer]   Tobacco Use   Smoking status: Former    Years: 2.00    Types: Cigarettes    Quit date: 02/05/2008    Years since quitting: 13.9   Smokeless tobacco: Never  Vaping Use   Vaping Use: Never used  Substance and Sexual Activity   Alcohol use: No   Drug use: No   Sexual activity: Not on file  Other Topics Concern   Not on file  Social History Narrative   Lives with wife and 2 kids, age 31 and 65.    Social Determinants of Health   Financial Resource Strain: Not on file  Food Insecurity: Not on file  Transportation Needs: Not on file   Physical Activity: Not on file  Stress: Not on file  Social Connections: Not on file  Intimate Partner Violence: Not on file    Family History:    Family History  Problem Relation Age of Onset   Diabetes Mother    Angina Mother    CAD Mother    Heart disease Mother    Glaucoma Father    Diabetes Father    Heart attack Father    Kidney failure Father    Diabetes Brother    Glaucoma Maternal Grandmother    Diabetes Maternal Grandmother    Heart disease Maternal Grandmother    Glaucoma Maternal Grandfather    Diabetes Maternal Grandfather    Heart disease Maternal Grandfather    Glaucoma Paternal Grandmother    Diabetes Paternal Grandmother    Glaucoma Paternal Grandfather    Diabetes Paternal Grandfather    Asthma Son      ROS:  Please see the history of present illness.   All other ROS reviewed and negative.     Physical Exam/Data:   Vitals:   01/05/22 1015 01/05/22 1030 01/05/22 1045 01/05/22 1100  BP: 126/76 134/76 126/74 133/82  Pulse: 73 70 68 69  Resp: 14 14 (!) 21 18  Temp:      TempSrc:      SpO2: 96% 98% 94% 98%  Weight:      Height:        Intake/Output Summary (Last 24 hours) at 01/05/2022 1134 Last data filed at 01/05/2022 1100 Gross per 24 hour  Intake 2080.23 ml  Output 800 ml  Net 1280.23 ml      01/05/2022    5:00 AM 01/04/2022    8:09 PM 12/30/2021   10:48 AM  Last 3 Weights  Weight (lbs) 236 lb 12.4 oz 247 lb 247 lb 8 oz  Weight (kg) 107.4 kg 112.038 kg 112.265 kg     Body mass index is 33.02 kg/m.  General:  Well nourished, well developed, in no acute distress HEENT: normal Neck: no JVD Vascular: No carotid bruits; Distal pulses 2+ bilaterally Cardiac:  normal S1, S2; RRR; no murmur  Lungs:  clear to auscultation bilaterally, no wheezing, rhonchi or rales  Abd: soft, nontender, no hepatomegaly  Ext: no edema Musculoskeletal:  No deformities, BUE and BLE strength normal and equal Skin: warm and dry  Neuro:  CNs 2-12  intact, no focal abnormalities  noted Psych:  Normal affect   EKG:  The EKG was personally reviewed and demonstrates:    12/10 sinus rhythm, 85 bpm nonspecific changes  12/11 sinus rhythm, 66 bpm, T wave inversion in lateral leads, prolonged QT 478, isolated Q-wave in lead III.   Telemetry:  Telemetry was personally reviewed and demonstrates:  Sinus Rhythm  Relevant CV Studies:  Echo: 01/05/2022  IMPRESSIONS     1. Left ventricular ejection fraction, by estimation, is 35 to 40%. The  left ventricle has moderately decreased function. The left ventricle  demonstrates regional wall motion abnormalities (see scoring  diagram/findings for description). The left  ventricular internal cavity size was mildly dilated. Left ventricular  diastolic parameters are consistent with Grade I diastolic dysfunction  (impaired relaxation). There is hypokinesis of the left ventricular,  septal wall, anterior wall and lateral wall.   There is severe hypokinesis of the left ventricular, basal-mid inferior  wall.   2. Right ventricular systolic function is normal. The right ventricular  size is normal.   3. Left atrial size was moderately dilated.   4. Right atrial size was mildly dilated.   5. The mitral valve is normal in structure. Trivial mitral valve  regurgitation. No evidence of mitral stenosis.   6. The aortic valve is grossly normal. Aortic valve regurgitation is not  visualized. No aortic stenosis is present.   7. The inferior vena cava is normal in size with greater than 50%  respiratory variability, suggesting right atrial pressure of 3 mmHg.   Comparison(s): No prior Echocardiogram.   Conclusion(s)/Recommendation(s): Reduced LVEF, with global hypokinesis but  also severe focal hypokinesis of basal to mid inferior wall. Results will  be communicated to primary team.   FINDINGS   Left Ventricle: Left ventricular ejection fraction, by estimation, is 35  to 40%. The left ventricle has  moderately decreased function. The left  ventricle demonstrates regional wall motion abnormalities. Severe  hypokinesis of the left ventricular,  basal-mid inferior wall. The left ventricular internal cavity size was  mildly dilated. There is borderline left ventricular hypertrophy. Left  ventricular diastolic parameters are consistent with Grade I diastolic  dysfunction (impaired relaxation).   Right Ventricle: The right ventricular size is normal. No increase in  right ventricular wall thickness. Right ventricular systolic function is  normal.   Left Atrium: Left atrial size was moderately dilated.   Right Atrium: Right atrial size was mildly dilated.   Pericardium: There is no evidence of pericardial effusion.   Mitral Valve: The mitral valve is normal in structure. Trivial mitral  valve regurgitation. No evidence of mitral valve stenosis.   Tricuspid Valve: The tricuspid valve is normal in structure. Tricuspid  valve regurgitation is trivial. No evidence of tricuspid stenosis.   Aortic Valve: The aortic valve is grossly normal. Aortic valve  regurgitation is not visualized. No aortic stenosis is present.   Pulmonic Valve: The pulmonic valve was grossly normal. Pulmonic valve  regurgitation is not visualized. No evidence of pulmonic stenosis.   Aorta: The aortic root, ascending aorta and aortic arch are all  structurally normal, with no evidence of dilitation or obstruction.   Venous: The inferior vena cava is normal in size with greater than 50%  respiratory variability, suggesting right atrial pressure of 3 mmHg.   IAS/Shunts: The atrial septum is grossly normal.    Laboratory Data:  High Sensitivity Troponin:   Recent Labs  Lab 01/04/22 2037 01/04/22 2209 01/05/22 0321 01/05/22 0654 01/05/22 0910  TROPONINIHS 19* 22*  1,719* 4,399* 7,294*     Chemistry Recent Labs  Lab 01/04/22 2037 01/05/22 0321  NA 138 135  K 3.4* 3.4*  CL 102 100  CO2 22 23  GLUCOSE  242* 277*  BUN 24* 23*  CREATININE 1.90* 1.74*  CALCIUM 9.0 8.9  MG  --  1.8  GFRNONAA 44* 49*  ANIONGAP 14 12    Recent Labs  Lab 01/04/22 2037  PROT 7.4  ALBUMIN 3.1*  AST 17  ALT 14  ALKPHOS 81  BILITOT 0.3   Lipids No results for input(s): "CHOL", "TRIG", "HDL", "LABVLDL", "LDLCALC", "CHOLHDL" in the last 168 hours.  Hematology Recent Labs  Lab 01/04/22 2037 01/05/22 0321  WBC 17.4* 17.6*  RBC 4.57 4.30  HGB 13.5 12.6*  HCT 38.8* 35.8*  MCV 84.9 83.3  MCH 29.5 29.3  MCHC 34.8 35.2  RDW 12.6 12.4  PLT 426* 334   Thyroid No results for input(s): "TSH", "FREET4" in the last 168 hours.  BNPNo results for input(s): "BNP", "PROBNP" in the last 168 hours.  DDimer No results for input(s): "DDIMER" in the last 168 hours.   Radiology/Studies:  ECHOCARDIOGRAM COMPLETE  Result Date: 01/05/2022    ECHOCARDIOGRAM REPORT   Patient Name:   RAHIL PASSEY Date of Exam: 01/05/2022 Medical Rec #:  [id number]     Height:       71.0 in Accession #:    [id number]    Weight:       236.8 lb Date of Birth:  [birth year]    BSA:          2.265 m Patient Age:    52 years      BP:           132/87 mmHg Patient Gender: M             HR:           68 bpm. Exam Location:  Inpatient Procedure: 2D Echo, Color Doppler and Cardiac Doppler STAT ECHO Indications:    abdominal aorta dissection  History:        Patient has no prior history of Echocardiogram examinations.                 Signs/Symptoms:elevated troponin; Risk Factors:Diabetes,                 Hypertension, Dyslipidemia, Sleep Apnea and Former Smoker.  Sonographer:    Johny Chess RDCS Referring Phys: [id number] VANESSA YAP IMPRESSIONS  1. Left ventricular ejection fraction, by estimation, is 35 to 40%. The left ventricle has moderately decreased function. The left ventricle demonstrates regional wall motion abnormalities (see scoring diagram/findings for description). The left ventricular internal cavity size was mildly dilated. Left  ventricular diastolic parameters are consistent with Grade I diastolic dysfunction (impaired relaxation). There is hypokinesis of the left ventricular, septal wall, anterior wall and lateral wall.  There is severe hypokinesis of the left ventricular, basal-mid inferior wall.  2. Right ventricular systolic function is normal. The right ventricular size is normal.  3. Left atrial size was moderately dilated.  4. Right atrial size was mildly dilated.  5. The mitral valve is normal in structure. Trivial mitral valve regurgitation. No evidence of mitral stenosis.  6. The aortic valve is grossly normal. Aortic valve regurgitation is not visualized. No aortic stenosis is present.  7. The inferior vena cava is normal in size with greater than 50% respiratory variability, suggesting right atrial pressure of 3 mmHg. Comparison(s): No prior Echocardiogram. Conclusion(s)/Recommendation(s): Reduced  LVEF, with global hypokinesis but also severe focal hypokinesis of basal to mid inferior wall. Results will be communicated to primary team. FINDINGS  Left Ventricle: Left ventricular ejection fraction, by estimation, is 35 to 40%. The left ventricle has moderately decreased function. The left ventricle demonstrates regional wall motion abnormalities. Severe hypokinesis of the left ventricular, basal-mid inferior wall. The left ventricular internal cavity size was mildly dilated. There is borderline left ventricular hypertrophy. Left ventricular diastolic parameters are consistent with Grade I diastolic dysfunction (impaired relaxation). Right Ventricle: The right ventricular size is normal. No increase in right ventricular wall thickness. Right ventricular systolic function is normal. Left Atrium: Left atrial size was moderately dilated. Right Atrium: Right atrial size was mildly dilated. Pericardium: There is no evidence of pericardial effusion. Mitral Valve: The mitral valve is normal in structure. Trivial mitral valve  regurgitation. No evidence of mitral valve stenosis. Tricuspid Valve: The tricuspid valve is normal in structure. Tricuspid valve regurgitation is trivial. No evidence of tricuspid stenosis. Aortic Valve: The aortic valve is grossly normal. Aortic valve regurgitation is not visualized. No aortic stenosis is present. Pulmonic Valve: The pulmonic valve was grossly normal. Pulmonic valve regurgitation is not visualized. No evidence of pulmonic stenosis. Aorta: The aortic root, ascending aorta and aortic arch are all structurally normal, with no evidence of dilitation or obstruction. Venous: The inferior vena cava is normal in size with greater than 50% respiratory variability, suggesting right atrial pressure of 3 mmHg. IAS/Shunts: The atrial septum is grossly normal.  LEFT VENTRICLE PLAX 2D LVIDd:         5.75 cm      Diastology LVIDs:         4.70 cm      LV e' medial:    5.91 cm/s LV PW:         1.10 cm      LV E/e' medial:  13.8 LV IVS:        1.20 cm      LV e' lateral:   7.15 cm/s LVOT diam:     2.30 cm      LV E/e' lateral: 11.4 LV SV:         70 LV SV Index:   31 LVOT Area:     4.15 cm  LV Volumes (MOD) LV vol d, MOD A4C: 176.0 ml LV vol s, MOD A4C: 110.0 ml LV SV MOD A4C:     176.0 ml RIGHT VENTRICLE             IVC RV Basal diam:  2.70 cm     IVC diam: 2.10 cm RV S prime:     13.40 cm/s TAPSE (M-mode): 2.8 cm LEFT ATRIUM             Index        RIGHT ATRIUM           Index LA diam:        4.20 cm 1.85 cm/m   RA Area:     18.10 cm LA Vol (A2C):   68.1 ml 30.06 ml/m  RA Volume:   49.60 ml  21.89 ml/m LA Vol (A4C):   85.8 ml 37.87 ml/m LA Biplane Vol: 82.3 ml 36.33 ml/m  AORTIC VALVE LVOT Vmax:   75.90 cm/s LVOT Vmean:  51.400 cm/s LVOT VTI:    0.168 m  AORTA Ao Asc diam: 3.40 cm MITRAL VALVE MV Area (PHT): 5.66 cm     SHUNTS MV Decel Time: 134 msec  Systemic VTI:  0.17 m MV E velocity: 81.30 cm/s   Systemic Diam: 2.30 cm MV A velocity: 101.00 cm/s MV E/A ratio:  0.80 Buford Dresser MD  Electronically signed by Buford Dresser MD Signature Date/Time: 01/05/2022/8:38:23 AM    Final    DG Chest 2 View  Result Date: 01/04/2022 CLINICAL DATA:  Chest pain EXAM: CHEST - 2 VIEW COMPARISON:  CT from earlier in the same day. FINDINGS: The heart size and mediastinal contours are within normal limits. Both lungs are clear. The visualized skeletal structures are unremarkable. IMPRESSION: No active cardiopulmonary disease. Electronically Signed   By: Inez Catalina M.D.   On: 01/04/2022 22:48   CT Angio Chest/Abd/Pel for Dissection W and/or Wo Contrast  Result Date: 01/04/2022 CLINICAL DATA:  Acute aortic syndrome suspected. EXAM: CT ANGIOGRAPHY CHEST, ABDOMEN AND PELVIS TECHNIQUE: Non-contrast CT of the chest was initially obtained. Multidetector CT imaging through the chest, abdomen and pelvis was performed using the standard protocol during bolus administration of intravenous contrast. Multiplanar reconstructed images and MIPs were obtained and reviewed to evaluate the vascular anatomy. RADIATION DOSE REDUCTION: This exam was performed according to the departmental dose-optimization program which includes automated exposure control, adjustment of the mA and/or kV according to patient size and/or use of iterative reconstruction technique. CONTRAST:  138mL OMNIPAQUE IOHEXOL 350 MG/ML SOLN COMPARISON:  06/18/2020. FINDINGS: CTA CHEST FINDINGS Cardiovascular: The heart is mildly enlarged and there is no pericardial effusion. Scattered coronary artery calcifications are present. The aorta is normal in caliber without evidence aneurysm or dissection. The pulmonary trunk is normal in caliber. Mediastinum/Nodes: No mediastinal, hilar, or axillary lymphadenopathy. The thyroid gland, trachea, and esophagus are within normal limits. Lungs/Pleura: Dependent atelectasis is noted bilaterally. No effusion or pneumothorax. Musculoskeletal: No chest wall abnormality. No acute osseous abnormality. Review of the  MIP images confirms the above findings. CTA ABDOMEN AND PELVIS FINDINGS VASCULAR Aorta: Aortic atherosclerosis with mural thrombus. There is a small penetrating ulcer on the left in the infrarenal abdominal aorta measuring 3 mm, coronal image 78. No intramural hematoma. Normal caliber aorta without aneurysm, dissection, vasculitis or significant stenosis. Celiac: Patent without evidence of aneurysm, dissection, vasculitis or significant stenosis. SMA: Patent without evidence of aneurysm, dissection, vasculitis or significant stenosis. Renals: Both renal arteries are patent without evidence of aneurysm, dissection, vasculitis, fibromuscular dysplasia or significant stenosis. IMA: Patent. Inflow: Patent without evidence of aneurysm, dissection, vasculitis or significant stenosis. Veins: No obvious venous abnormality within the limitations of this arterial phase study. Review of the MIP images confirms the above findings. NON-VASCULAR Hepatobiliary: No focal liver abnormality is seen. No gallstones, gallbladder wall thickening, or biliary dilatation. Pancreas: Unremarkable. No pancreatic ductal dilatation or surrounding inflammatory changes. Spleen: Normal in size without focal abnormality. Adrenals/Urinary Tract: The adrenal glands are within normal limits. Nonobstructive left renal calculi. No hydronephrosis bilaterally. The bladder is unremarkable. Stomach/Bowel: Stomach is within normal limits. Appendix appears normal. No evidence of bowel wall thickening, distention, or inflammatory changes. No effusion or pneumothorax. Scattered diverticula along the colon without evidence of diverticulitis. Lymphatic: No abdominal or pelvic lymphadenopathy. Reproductive: Mildly enlarged prostate gland. Other: No abdominopelvic ascites.  Fat containing umbilical hernia. Musculoskeletal: Degenerative changes are present in the lumbar spine. No acute osseous abnormality. Review of the MIP images confirms the above findings.  IMPRESSION: 1. No evidence aortic aneurysm or dissection. 2. 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 No mural hematoma. 3. Nonobstructive  left renal calculi. 4. Cardiomegaly with coronary artery calcifications. 5. Colonic diverticulosis without diverticulitis. Electronically Signed   By: Brett Fairy M.D.   On: 01/04/2022 21:57   VAS [place] LOWER EXTREMITY VENOUS (DVT)  Result Date: 01/02/2022  Lower Venous DVT Study Patient Name:  AZRIEL JAKOB  Date of Exam:   01/02/2022 Medical Rec #: [id number]      Accession #:    [id number] Date of Birth: 01-17-1976     Patient Gender: M Patient Age:   25 years Exam Location:  [facility] Procedure:      VAS [place] LOWER EXTREMITY VENOUS (DVT) Referring Phys: Lalla Brothers --------------------------------------------------------------------------------  Indications: Swelling. Other Indications: History of pain and swelling in right leg lateral side. Performing Technologist: Oda Cogan RDMS, RVT  Examination Guidelines: A complete evaluation includes B-mode imaging, spectral Doppler, color Doppler, and power Doppler as needed of all accessible portions of each vessel. Bilateral testing is considered an integral part of a complete examination. Limited examinations for reoccurring indications may be performed as noted. The reflux portion of the exam is performed with the patient in reverse Trendelenburg.  +---------+---------------+---------+-----------+----------+--------------+ RIGHT    CompressibilityPhasicitySpontaneityPropertiesThrombus Aging +---------+---------------+---------+-----------+----------+--------------+ CFV      Full           Yes      Yes                                 +---------+---------------+---------+-----------+----------+--------------+ SFJ      Full                                                         +---------+---------------+---------+-----------+----------+--------------+ FV Prox  Full                                                        +---------+---------------+---------+-----------+----------+--------------+ FV Mid   Full                                                        +---------+---------------+---------+-----------+----------+--------------+ FV DistalFull                                                        +---------+---------------+---------+-----------+----------+--------------+ PFV      Full                                                        +---------+---------------+---------+-----------+----------+--------------+ POP      Full           Yes      Yes                                 +---------+---------------+---------+-----------+----------+--------------+  PTV      Full                                                        +---------+---------------+---------+-----------+----------+--------------+ PERO     Full                                                        +---------+---------------+---------+-----------+----------+--------------+   +----+---------------+---------+-----------+----------+--------------+ LEFTCompressibilityPhasicitySpontaneityPropertiesThrombus Aging +----+---------------+---------+-----------+----------+--------------+ CFV Full           Yes      Yes                                 +----+---------------+---------+-----------+----------+--------------+ SFJ Full                                                        +----+---------------+---------+-----------+----------+--------------+     Summary: RIGHT: - There is no evidence of deep vein thrombosis in the lower extremity.  - No cystic structure found in the popliteal fossa.  LEFT: - No evidence of common femoral vein obstruction.  *See table(s) above for measurements and observations. Electronically signed by Servando Snare MD on 01/02/2022  at 5:16:22 PM.    Final      Assessment and Plan:   Huxton Glaus is a 47 y.o. male with a hx of HTN, HLD, DM and OSA who is being seen 01/05/2022 for the evaluation of elevated troponin at the request of Dr. Carlis Abbott.  NSTEMI (demand vs ACS) HFrEF -- hsTn initially 19, continues to trend up with last value 7294. EKG with TWI in lateral leads, q waves in lead III.  -- CT a/p with coronary calcifications noted -- Echo notes LVEF of 35-40%, global hypokinesis but more severe in basal-inferior wall -- he denies any anginal symptoms PTA, does have strong family hx of CAD with mother having PCI and father MI 84's -- ideally would heparinize but will review with MD given abd aortic ulcer -- will need further work up with ischemic evaluation with cardiac cath if MD agrees -- serial EKGs, continue to cycle troponin -- GDMT: now on coreg, hydralazine. Unable to add ARB/ANRi presently with elevated Cr. Will resume jardiance   Hypertensive emergency - Systolic BP greater than 818 on admission, initially treated with Cleviprex and esmolol drip.  Has been weaned from Cleviprex. Esmolol drip to be weaned -- Continue amlodipine, hydralazine, Coreg. Increase hydralazine to 75mg  TID, coreg to 12.5mg  BID  Penetrating abdominal aortic ulcer --Initially presented with abdominal/back pain.  Mural thrombus of abdominal aorta with small penetrating ulcer in the infrarenal abdominal aorta measuring 3 mm with no mural hematoma. -- Evaluated by VVS with recommendations for pain management and blood pressure control, repeat scan 4 to 6 weeks as long as no recurrent pain.  AKI in the setting of hypertensive emergency -- Cr 1.90>>1.74  -- follow BMET   DM -- Hgb A1c 11.5 11/2021 -- on SSI while  inpatient -- jardiance/glipizide outpatient  Prior tobacco use -- reports cessation 3 weeks ago  Risk Assessment/Risk Scores:    TIMI Risk Score for Unstable Angina or Non-ST Elevation MI:   The patient's TIMI risk  score is 2, which indicates a 8% risk of all cause mortality, new or recurrent myocardial infarction or need for urgent revascularization in the next 14 days.{  For questions or updates, please contact [place] Please consult [email or web address] for contact info under    Signed, Reino Bellis, NP  01/05/2022 11:34 AM   Addendum: discussed with MD, will check with VVS to ensure ok to heparinize as it appears area of ulceration/thrombus could be chronic finding, low concern for bleeding. Will proceed with cardiac cath tomorrow to allow to wean esmolol.  -- will order contrast dye prophylaxis   I have personally seen and examined this patient. I agree with the assessment and plan as outlined above.  46 yo male with HTN, HLD, DM and sleep apnea who is admitted with hypertensive urgency and epigastric pain. CTA with no evidence of aortic dissection. 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 Echo with LVEF=35-40$. EKG with non-specific changes. Troponin elevated over 7000.  No pain today.  Vascular surgery has seen him and there is no urgent concern about the ulcer in the plaque in his abd aorta.  My exam: I agree with above, in addition: RRR, no murmurs. Lungs clear. Ext: no LE edema Labs reviewed.  Contrast allergy noted Echo reviewed by me Plan: NSTEMI: Cardiac cath is indicated. I have reviewed this with the patient and his wife. Will make him NPO at midnight. Cardiac cath tomorrow. His blood pressure is much better on esmolol drip. Hopefully, this can be weaned off today. I would start IV heparin (plan discussed with Vascular surgery team). Continue ASA, statin and beta blocker.    I have reviewed the risks, indications, and alternatives to cardiac catheterization, possible angioplasty, and stenting with the patient. Risks include but are not limited to bleeding, infection, vascular injury, stroke, myocardial infection, arrhythmia, kidney injury,  radiation-related injury in the case of prolonged fluoroscopy use, emergency cardiac surgery, and death. The patient understands the risks of serious complication is 1-2 in 2244 with diagnostic cardiac cath and 1-2% or less with angioplasty/stenting.   Lauree Chandler, MD, [facility] 01/05/2022 12:24 PM

## 2022-01-05 NOTE — Progress Notes (Signed)
RT discussed wearing CPAP with the patient and patient stated that he no longer needs CPAP since his weight loss.  RT will continue to monitor in the event that patient changes his mind.

## 2022-01-05 NOTE — Progress Notes (Signed)
Case discussed with Dr. Angelena Form. Concern for NSTEMI. Planning for [facility] tomorrow. Ok for heparin gtt per his discussion with VS.  Julian Hy, DO 01/05/22 12:15 PM [facility]

## 2022-01-05 NOTE — Progress Notes (Signed)
  Echocardiogram 2D Echocardiogram has been performed.  Shaun Cummings 01/05/2022, 8:27 AM

## 2022-01-05 NOTE — Progress Notes (Signed)
NAME:  Shaun Cummings, MRN:  [id number], DOB:  1975/12/22, LOS: 1 ADMISSION DATE:  01/04/2022, CONSULTATION DATE:  12/10 REFERRING MD:  Nechama Guard, CHIEF COMPLAINT:  chest pain   History of Present Illness:  Shaun Cummings, is a 46 y.o. male, who presented to the [facility] ED with a chief complaint of chest pain  They have a pertinent past medical history of HTN, HLD, OSA, DM2  ED course was notable for blood pressure of 206/111, troponin 19, CTA chest abdomen pelvis with mall 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 No mural hematoma.  No evidence of aortic aneurysm or dissection.  Creatinine 1.9.  Vascular surgery was consulted in the emergency department.  Recommends medical management goal systolic blood pressure less than 120.  Started on esmolol drip.  PCCM was consulted for admission.  Pertinent  Medical History  HTN, HLD, OSA, DM2  Significant Hospital Events: Including procedures, antibiotic start and stop dates in addition to other pertinent events   12/10 presented to [facility]. CTA chest abd pelvis-  CTA chest abdomen pelvis with mall 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, VVS consult, PCCM consult  Interim History / Subjective:  Denies pain today. Foot numbness better than a month ago, not worse.   Objective   Blood pressure 112/73, pulse 65, temperature 97.6 F (36.4 C), temperature source Oral, resp. rate 15, height 5\' 11"  (1.803 m), weight 107.4 kg, SpO2 95 %.        Intake/Output Summary (Last 24 hours) at 01/05/2022 0725 Last data filed at 01/05/2022 0600 Gross per 24 hour  Intake 1562.69 ml  Output --  Net 1562.69 ml    Filed Weights   01/04/22 2009 01/05/22 0500  Weight: 112 kg 107.4 kg    Examination: General: middle aged man lying in bed in NAD HEENT: [place]/AT eyes anicteric Neuro: awake and alert, moving all extremities, answering questions appropriately. CV: S1S2, RRR PULM: CTAB, breathing comfortably on  RA GI: soft, NT Extremities: warm, no cyanosis Skin:  warm, dry, no rashes  EKG personally reviewed> anterolateral TWI, progressed compared to 12/10 EKG Trop  1719  WBC 17.6 H/H 12.6/35.8 BUN 23 Cr 1.74 Echo:  LVEF 35-40% with hypokinesis of the LV septal, anterior, & lateral walls. Severe hypokinesis oif the LV basal mid inferior wall. G1DD. LA moderately dilated, RA mildly dilated. Normal RV function.   Resolved Hospital Problem list     Assessment & Plan:  Hypertensive emergency 33mm penetrating ulcer in the infrarenal abdominal aorta  History of HTN -con't esmolol -goal SBP <120, HR<60 -con't amlodipine, hydralazine q8h -adding coreg 6.25mg  BID  Troponin elevation with anterolateral TWI progressing on EKG Chest pain- worry about ACS vs secondary to HTN and ulcer Acute HFrEF; concern is for ICM with EKG changes and trop elevation -strict HR & BP control -repeat troponin level -Cardiology consulted -Not an ideal candidate for heparin infusion with an acute aortic syndrome.   AKI potentially due to hypertensive emergency -strict BP control; need to maintain MAP >65 -renally dose meds, avoid nephrotoxic meds -strict I/O -monitor  Hypokalemia -K+ repletion  DM2- uncontrolled. A1c 11.5 in November. -received steroids for contrast exposure -SSI PRN -adding glargine 8 units daily -hold PTA jardiance and glipizide -goal BG 140-180 -carb modified diet  OSA -CPAP QHS  Former tobacco abuse-- <1ppd x 34 yrs -recommend ongoing cessation -recommend OP referral for lung cancer screening at age 54  [place] (right click and "Reselect all SmartList Selections"  daily)   Diet/type: Regular consistency (see orders) DVT prophylaxis: prophylactic heparin  GI prophylaxis: N/A Lines: N/A Foley:  N/A Code Status:  full code Last date of multidisciplinary goals of care discussion [Full scope 12/10]  Labs   CBC: Recent Labs  Lab 01/04/22 2037 01/05/22 0321  WBC  17.4* 17.6*  HGB 13.5 12.6*  HCT 38.8* 35.8*  MCV 84.9 83.3  PLT 426* 334     Basic Metabolic Panel: Recent Labs  Lab 01/04/22 2037 01/05/22 0321  NA 138 135  K 3.4* 3.4*  CL 102 100  CO2 22 23  GLUCOSE 242* 277*  BUN 24* 23*  CREATININE 1.90* 1.74*  CALCIUM 9.0 8.9  MG  --  1.8    GFR: Estimated Creatinine Clearance: 67.5 mL/min (A) (by C-G formula based on SCr of 1.74 mg/dL (H)). Recent Labs  Lab 01/04/22 2037 01/05/22 0321  WBC 17.4* 17.6*      This patient is critically ill with multiple organ system failure which requires frequent high complexity decision making, assessment, support, evaluation, and titration of therapies. This was completed through the application of advanced monitoring technologies and extensive interpretation of multiple databases. During this encounter critical care time was devoted to patient care services described in this note for 36 minutes.  Julian Hy, DO 01/05/22 9:32 AM [facility]  For contact information, see Amion. If no response to pager, please call PCCM consult pager. After hours, 7PM- 7AM, please call [facility].

## 2022-01-05 NOTE — Progress Notes (Signed)
[facility] Progress Note Patient Name: Shaun Cummings DOB: [birth year] MRN: [id number]   Date of Service  01/05/2022  HPI/Events of Note  Notified of elevated troponin of 1,719.   eICU Interventions  Case discussed with Dr. Ree Shay.  Ordered 2d echo stat and if there is wall motion abnormalities, pt may need anticoagulation.  Cardiology consult.  Repeat EKG.  Trend troponin.      Intervention Category Intermediate Interventions: Other:  Elsie Lincoln 01/05/2022, 6:39 AM

## 2022-01-05 NOTE — Inpatient Diabetes Management (Addendum)
Inpatient Diabetes Program Recommendations  AACE/ADA: New Consensus Statement on Inpatient Glycemic Control (2015)  Target Ranges:  Prepandial:   less than 140 mg/dL      Peak postprandial:   less than 180 mg/dL (1-2 hours)      Critically ill patients:  140 - 180 mg/dL   Lab Results  Component Value Date   GLUCAP 234 (H) 01/05/2022   HGBA1C 11.5 (A) 12/16/2021    Review of Glycemic Control  Latest Reference Range & Units 01/05/22 00:08 01/05/22 01:41 01/05/22 03:20 01/05/22 08:07  Glucose-Capillary 70 - 99 mg/dL 262 (H) 255 (H) 277 (H) 234 (H)   Diabetes history: DM  Outpatient Diabetes medications:  Glucotrol 5 mg with breakfast, Jardiance 10 mg q AM Current orders for Inpatient glycemic control:  Novolog 0-15 units tid with meals and HS Semglee 5 units daily  Inpatient Diabetes Program Recommendations:    Note elevated CBG's.  A1C on 12/16/21 was 11.5%.  Likely needs insulin at d/c.  Will discuss with patient.   Thanks,  Adah Perl, RN, BC-ADM Inpatient Diabetes Coordinator Pager [phone]  (8a-5p)  10:45 a- Spoke with patient at bedside regarding DM management.  He states that he was previously on insulin however was able to stop taking it due to blood sugars improving. He then lost insurance and was unable to take medications as ordered.  He is aware that his A1C is 11.5% but states that he talked to his MD and told him that he wants to try to get it down without insulin.  I explained to patient that this might be difficult but he states he thinks he can do it.  He just started on Dexcom G7 sensor and states that he had to order new one the day after it was started due to malfunction.  He received the new one in the mail yesterday but has not put on.  We discussed the long term complications of uncontrolled DM.   May consider titrating basal insulin up based on fasting CBG's.  Will follow.

## 2022-01-05 NOTE — Progress Notes (Signed)
Troponin 7,294 reported to Dr Carlis Abbott

## 2022-01-06 ENCOUNTER — Encounter (HOSPITAL_COMMUNITY)
Admission: EM | Disposition: A | Discharge status: Home / Self Care | Payer: Self-pay | Source: Home / Self Care | Attending: Internal Medicine

## 2022-01-06 ENCOUNTER — Encounter (HOSPITAL_COMMUNITY): Payer: Self-pay | Admitting: Cardiovascular Disease

## 2022-01-06 DIAGNOSIS — I25110 Atherosclerotic heart disease of native coronary artery with unstable angina pectoris: Secondary | ICD-10-CM | POA: Diagnosis not present

## 2022-01-06 DIAGNOSIS — I214 Non-ST elevation (NSTEMI) myocardial infarction: Secondary | ICD-10-CM

## 2022-01-06 DIAGNOSIS — I2510 Atherosclerotic heart disease of native coronary artery without angina pectoris: Secondary | ICD-10-CM | POA: Diagnosis not present

## 2022-01-06 DIAGNOSIS — I719 Aortic aneurysm of unspecified site, without rupture: Secondary | ICD-10-CM | POA: Diagnosis not present

## 2022-01-06 DIAGNOSIS — I161 Hypertensive emergency: Secondary | ICD-10-CM | POA: Diagnosis not present

## 2022-01-06 DIAGNOSIS — N179 Acute kidney failure, unspecified: Secondary | ICD-10-CM | POA: Diagnosis not present

## 2022-01-06 DIAGNOSIS — R7989 Other specified abnormal findings of blood chemistry: Secondary | ICD-10-CM | POA: Diagnosis not present

## 2022-01-06 HISTORY — PX: LEFT HEART CATH AND CORONARY ANGIOGRAPHY: CATH118249

## 2022-01-06 LAB — CBC
HCT: 33.3 % — ABNORMAL LOW (ref 39.0–52.0)
Hemoglobin: 11.3 g/dL — ABNORMAL LOW (ref 13.0–17.0)
MCH: 29.1 pg (ref 26.0–34.0)
MCHC: 33.9 g/dL (ref 30.0–36.0)
MCV: 85.8 fL (ref 80.0–100.0)
Platelets: 338 K/uL (ref 150–400)
RBC: 3.88 MIL/uL — ABNORMAL LOW (ref 4.22–5.81)
RDW: 12.9 % (ref 11.5–15.5)
WBC: 24.6 K/uL — ABNORMAL HIGH (ref 4.0–10.5)
nRBC: 0 % (ref 0.0–0.2)

## 2022-01-06 LAB — BASIC METABOLIC PANEL
Anion gap: 10 (ref 5–15)
BUN: 32 mg/dL — ABNORMAL HIGH (ref 6–20)
CO2: 19 mmol/L — ABNORMAL LOW (ref 22–32)
Calcium: 8.4 mg/dL — ABNORMAL LOW (ref 8.9–10.3)
Chloride: 105 mmol/L (ref 98–111)
Creatinine, Ser: 1.95 mg/dL — ABNORMAL HIGH (ref 0.61–1.24)
GFR, Estimated: 43 mL/min — ABNORMAL LOW (ref >60)
Glucose, Bld: 236 mg/dL — ABNORMAL HIGH (ref 70–99)
Potassium: 4.4 mmol/L (ref 3.5–5.1)
Sodium: 134 mmol/L — ABNORMAL LOW (ref 135–145)

## 2022-01-06 LAB — HEPARIN LEVEL (UNFRACTIONATED): Heparin Unfractionated: 0.17 [IU]/mL — ABNORMAL LOW (ref 0.30–0.70)

## 2022-01-06 LAB — POCT ACTIVATED CLOTTING TIME: Activated Clotting Time: 185 seconds

## 2022-01-06 LAB — GLUCOSE, CAPILLARY
Glucose-Capillary: 252 mg/dL — ABNORMAL HIGH (ref 70–99)
Glucose-Capillary: 191 mg/dL — ABNORMAL HIGH (ref 70–99)
Glucose-Capillary: 184 mg/dL — ABNORMAL HIGH (ref 70–99)
Glucose-Capillary: 227 mg/dL — ABNORMAL HIGH (ref 70–99)

## 2022-01-06 SURGERY — LEFT HEART CATH AND CORONARY ANGIOGRAPHY
Anesthesia: LOCAL

## 2022-01-06 MED ORDER — HEPARIN (PORCINE) IN NACL 1000-0.9 UT/500ML-% IV SOLN
INTRAVENOUS | Status: DC | PRN
  Administered 2022-01-06 (×2): 500 mL

## 2022-01-06 MED ORDER — HEPARIN SODIUM (PORCINE) 1000 UNIT/ML IJ SOLN
INTRAMUSCULAR | Status: DC | PRN
  Administered 2022-01-06: 5000 [IU] via INTRAVENOUS

## 2022-01-06 MED ORDER — HYDROMORPHONE HCL 1 MG/ML IJ SOLN
1.0000 mg | INTRAMUSCULAR | Status: AC | PRN
  Administered 2022-01-06 (×2): 1 mg via INTRAVENOUS
  Filled 2022-01-06: qty 1

## 2022-01-06 MED ORDER — SODIUM CHLORIDE 0.9% FLUSH
3.0000 mL | Freq: Two times a day (BID) | INTRAVENOUS | Status: DC
  Administered 2022-01-06 – 2022-01-14 (×16): 3 mL via INTRAVENOUS

## 2022-01-06 MED ORDER — SODIUM CHLORIDE 0.9% FLUSH
3.0000 mL | INTRAVENOUS | Status: DC | PRN
  Administered 2022-01-06: 3 mL via INTRAVENOUS

## 2022-01-06 MED ORDER — ONDANSETRON HCL 4 MG/2ML IJ SOLN
4.0000 mg | Freq: Four times a day (QID) | INTRAMUSCULAR | Status: DC | PRN
  Administered 2022-01-12 – 2022-01-13 (×2): 4 mg via INTRAVENOUS
  Filled 2022-01-06 (×2): qty 2

## 2022-01-06 MED ORDER — IOHEXOL 350 MG/ML SOLN
INTRAVENOUS | Status: DC | PRN
  Administered 2022-01-06: 105 mL

## 2022-01-06 MED ORDER — MIDAZOLAM HCL 2 MG/2ML IJ SOLN
INTRAMUSCULAR | Status: DC | PRN
  Administered 2022-01-06 (×2): 1 mg via INTRAVENOUS
  Administered 2022-01-06: 2 mg via INTRAVENOUS

## 2022-01-06 MED ORDER — NITROGLYCERIN 1 MG/10 ML FOR IR/CATH LAB
INTRA_ARTERIAL | Status: AC
  Filled 2022-01-06: qty 10

## 2022-01-06 MED ORDER — NITROGLYCERIN 1 MG/10 ML FOR IR/CATH LAB
INTRA_ARTERIAL | Status: DC | PRN
  Administered 2022-01-06: 200 ug via INTRA_ARTERIAL

## 2022-01-06 MED ORDER — HEPARIN BOLUS VIA INFUSION
3000.0000 [IU] | Freq: Once | INTRAVENOUS | Status: AC
  Administered 2022-01-06: 3000 [IU] via INTRAVENOUS
  Filled 2022-01-06: qty 3000

## 2022-01-06 MED ORDER — VERAPAMIL HCL 2.5 MG/ML IV SOLN
INTRAVENOUS | Status: AC
  Filled 2022-01-06: qty 2

## 2022-01-06 MED ORDER — HYDROMORPHONE HCL 1 MG/ML IJ SOLN
INTRAMUSCULAR | Status: AC
  Filled 2022-01-06: qty 1

## 2022-01-06 MED ORDER — LIDOCAINE HCL (PF) 1 % IJ SOLN
INTRAMUSCULAR | Status: DC | PRN
  Administered 2022-01-06: 5 mL
  Administered 2022-01-06: 2 mL

## 2022-01-06 MED ORDER — HYDRALAZINE HCL 20 MG/ML IJ SOLN: 10.0000 mg | INTRAMUSCULAR | Status: AC | PRN

## 2022-01-06 MED ORDER — MIDAZOLAM HCL 2 MG/2ML IJ SOLN
INTRAMUSCULAR | Status: AC
  Filled 2022-01-06: qty 2

## 2022-01-06 MED ORDER — HEPARIN SODIUM (PORCINE) 1000 UNIT/ML IJ SOLN
INTRAMUSCULAR | Status: AC
  Filled 2022-01-06: qty 10

## 2022-01-06 MED ORDER — LIDOCAINE HCL (PF) 1 % IJ SOLN
INTRAMUSCULAR | Status: AC
  Filled 2022-01-06: qty 30

## 2022-01-06 MED ORDER — ASPIRIN 81 MG PO CHEW
81.0000 mg | CHEWABLE_TABLET | Freq: Every day | ORAL | Status: DC
  Administered 2022-01-07 – 2022-01-16 (×10): 81 mg via ORAL
  Filled 2022-01-06 (×10): qty 1

## 2022-01-06 MED ORDER — FENTANYL CITRATE (PF) 100 MCG/2ML IJ SOLN
INTRAMUSCULAR | Status: DC | PRN
  Administered 2022-01-06 (×2): 25 ug via INTRAVENOUS

## 2022-01-06 MED ORDER — HEPARIN (PORCINE) IN NACL 1000-0.9 UT/500ML-% IV SOLN
INTRAVENOUS | Status: AC
  Filled 2022-01-06: qty 1000

## 2022-01-06 MED ORDER — VERAPAMIL HCL 2.5 MG/ML IV SOLN
INTRAVENOUS | Status: DC | PRN
  Administered 2022-01-06 (×3): 10 mL via INTRA_ARTERIAL

## 2022-01-06 MED ORDER — HEPARIN (PORCINE) 25000 UT/250ML-% IV SOLN
2400.0000 [IU]/h | INTRAVENOUS | Status: DC
  Administered 2022-01-06: 1600 [IU]/h via INTRAVENOUS
  Administered 2022-01-07: 2100 [IU]/h via INTRAVENOUS
  Administered 2022-01-07: 1600 [IU]/h via INTRAVENOUS
  Administered 2022-01-08 (×2): 2100 [IU]/h via INTRAVENOUS
  Administered 2022-01-09 – 2022-01-10 (×2): 2200 [IU]/h via INTRAVENOUS
  Administered 2022-01-10 – 2022-01-13 (×6): 2400 [IU]/h via INTRAVENOUS
  Filled 2022-01-06 (×16): qty 250

## 2022-01-06 MED ORDER — INSULIN ASPART 100 UNIT/ML IJ SOLN
INTRAMUSCULAR | Status: AC
  Filled 2022-01-06: qty 1

## 2022-01-06 MED ORDER — ACETAMINOPHEN 325 MG PO TABS
650.0000 mg | ORAL_TABLET | ORAL | Status: DC | PRN
  Administered 2022-01-06 – 2022-01-12 (×4): 650 mg via ORAL
  Filled 2022-01-06 (×4): qty 2

## 2022-01-06 MED ORDER — ATORVASTATIN CALCIUM 80 MG PO TABS
80.0000 mg | ORAL_TABLET | Freq: Every day | ORAL | Status: DC
  Administered 2022-01-06 – 2022-01-16 (×11): 80 mg via ORAL
  Filled 2022-01-06 (×11): qty 1

## 2022-01-06 MED ORDER — SODIUM CHLORIDE 0.9 % IV SOLN: 250.0000 mL | INTRAVENOUS | Status: DC | PRN

## 2022-01-06 MED ORDER — FENTANYL CITRATE (PF) 100 MCG/2ML IJ SOLN
INTRAMUSCULAR | Status: AC
  Filled 2022-01-06: qty 2

## 2022-01-06 MED ORDER — SODIUM CHLORIDE 0.9 % IV SOLN: INTRAVENOUS | Status: AC

## 2022-01-06 MED ORDER — LABETALOL HCL 5 MG/ML IV SOLN: 10.0000 mg | INTRAVENOUS | Status: AC | PRN

## 2022-01-06 MED ORDER — DIAZEPAM 5 MG PO TABS
5.0000 mg | ORAL_TABLET | ORAL | Status: DC | PRN
  Administered 2022-01-06 – 2022-01-15 (×6): 5 mg via ORAL
  Filled 2022-01-06 (×6): qty 1

## 2022-01-06 SURGICAL SUPPLY — 18 items
CATH INFINITI 4FR JL3.5 (CATHETERS) IMPLANT
CATH INFINITI 5 FR JL3.5 (CATHETERS) IMPLANT
CATH INFINITI 5FR JL4 (CATHETERS) IMPLANT
CATH INFINITI JR4 5F (CATHETERS) IMPLANT
CATH LAUNCHER 5F EBU3.5 (CATHETERS) IMPLANT
CATH OPTITORQUE TIG 4.0 5F (CATHETERS) IMPLANT
DEVICE RAD COMP TR BAND LRG (VASCULAR PRODUCTS) IMPLANT
GLIDESHEATH SLEND SS 6F .021 (SHEATH) IMPLANT
GUIDEWIRE INQWIRE 1.5J.035X260 (WIRE) IMPLANT
INQWIRE 1.5J .035X260CM (WIRE) ×1
KIT HEART LEFT (KITS) ×2 IMPLANT
PACK CARDIAC CATHETERIZATION (CUSTOM PROCEDURE TRAY) ×2 IMPLANT
SHEATH PINNACLE 5F 10CM (SHEATH) IMPLANT
SYR MEDRAD MARK 7 150ML (SYRINGE) ×2 IMPLANT
TRANSDUCER W/STOPCOCK (MISCELLANEOUS) ×2 IMPLANT
TUBING CIL FLEX 10 FLL-RA (TUBING) ×2 IMPLANT
WIRE EMERALD 3MM-J .035X150CM (WIRE) IMPLANT
WIRE HI TORQ VERSACORE-J 145CM (WIRE) IMPLANT

## 2022-01-06 NOTE — Progress Notes (Signed)
SITE AREA: right groin/femoral  SITE PRIOR TO REMOVAL:  LEVEL 0  PRESSURE APPLIED FOR: approximately 20 minutes  MANUAL: yes  PATIENT STATUS DURING PULL: stable, resting with eyes closed  POST PULL SITE:  LEVEL 0  POST PULL INSTRUCTIONS GIVEN: yes  POST PULL PULSES PRESENT: bilateral pedal pulses palpable at +2  DRESSING APPLIED: gauze with tegaderm  BEDREST BEGINS @ 1040  COMMENTS: Sheath removed by Wilburn Cornelia, RN, this RN present

## 2022-01-06 NOTE — Consult Note (Addendum)
[address]       [place] [address]             [phone]       PCP is Iona Beard, MD Referring Provider is Dr. Claiborne Billings, MD Cardiologist: Dr. Gwyndolyn Kaufman, MD  Chief Complaint  Patient presents with   Chest Pain  Reason for consultation: Multivessel coronary artery disease   History of Presenting Illness: This is a 46 year old male with a family history of coronary artery disease (mother with PCI and multiple in her 24's, father had MI in his 36's ), past medical history of DM, hypertension, hyperlipidemia, tobacco abuse, peripheral neuropathy, and OSA who presented to [name] on 01/04/2022 with complaints of pain that started in his abdomen and moved up to his chest and radiated to his back. He also had diaphoresis and fatigue but denied dizziness, syncope, shortness of breath,He was very hypertensive on arrival (systolic BP over 213). He was put on Esmolol and Clevidipine drips. 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 Vascular surgery was consulted. Per Dr. Donzetta Matters, there was some evidence of aortic mural thrombus as far back as 2019. As long as  pain is controlled with BP management, would re scan in 4-6 weeks or if pain not well controlled, re scan in 48-72 hours. Echo done 01/05/2022 showed LVEF 35-40%, hypokinesis of the LV septal wall, anterior and lateral walls, trivial MR and TR.  EKG on admission showed sinus rhythm, 85 bpm nonspecific changes. Initial Troponin I (high sensitivity) was 19 and max'd at 7294. Patient ruled in for a NSTEMI. Cardiology was then consulted. Creatinine was 1.9 on admission and today is 1.95. Patient had a cardiac catheterization 01/06/2022 that showed proximal LAD stenosis 70%, mid Circumflex 2 lesion 70% stenosed, OM2 95% stenosed, proximal to  mid RCA with 95% stenosis. Cardiothoracic consultation has been requested with Dr. Cyndia Bent. At the time of my exam, patient without chest pain, VSS.  Past Medical History: Past Medical History:  Diagnosis Date   Chest pain    HTN (hypertension)    Hyperlipidemia    Lesion of penis    foreskin   OSA (obstructive sleep apnea)    per pt dx osa and used cpap but after losing wt. from 415 pounds down to 244 pounds no longer needs cpap   Peripheral neuropathy    Phimosis    Scrotal abscess 05/08/2017   Type 2 diabetes mellitus ([place])    per pt no meds for two years pt was changed to levemir unable to afford but pt states is waiting on approval for a program he applied for that will pay for his meds ([employer])      Past Surgical History: Past Surgical History:  Procedure Laterality Date   CIRCUMCISION N/A 02/10/2016   Procedure: CIRCUMCISION ADULT;  Surgeon: Cleon Gustin, MD;  Location: [facility];  Service: Urology;  Laterality: N/A;   INCISION AND DRAINAGE ABSCESS Right 05/12/2017   Procedure: INCISION AND DRAINAGE RIGHT INGUINAL ABSCESS;  Surgeon: Erroll Luna, MD;  Location: [facility];  Service: General;  Laterality: Right;   INCISION AND DRAINAGE ABSCESS N/A 03/24/2018   Procedure: INCISION AND DRAINAGE POSTERIOR NECK ABSCESS;  Surgeon: Excell Seltzer, MD;  Location: [facility];  Service: General;  Laterality: N/A;  INCISION AND DRAINAGE ABSCESS Right 06/19/2020   Procedure: INCISION AND DRAINAGE ABSCESS, RIGHT GROIN;  Surgeon: Johnathan Hausen, MD;  Location: [facility];  Service: General;  Laterality: Right;   INCISION AND DRAINAGE PERIRECTAL ABSCESS Right 05/09/2017   Procedure: IRRIGATION AND DEBRIDEMENT PERINEAL ABSCESS;  Surgeon: Donnie Mesa, MD;  Location: [facility];  Service: General;  Laterality: Right;    Family History: Family History  Problem Relation Age of Onset   Diabetes Mother    Angina Mother    CAD Mother    Heart disease Mother     Glaucoma Father    Diabetes Father    Heart attack Father    Kidney failure Father    Diabetes Brother    Glaucoma Maternal Grandmother    Diabetes Maternal Grandmother    Heart disease Maternal Grandmother    Glaucoma Maternal Grandfather    Diabetes Maternal Grandfather    Heart disease Maternal Grandfather    Glaucoma Paternal Grandmother    Diabetes Paternal Grandmother    Glaucoma Paternal Grandfather    Diabetes Paternal Grandfather    Asthma Son     Social History Social History   Tobacco Use   Smoking status: Former    Years: 2.00    Types: Cigarettes    Quit date: 3 weeks ago    Years since quitting:    Smokeless tobacco: Never  Vaping Use   Vaping Use: Never used  Substance Use Topics   Alcohol use: No   Drug use: No  He is married, has 2 children ages 87 and 80. 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  Current Facility-Administered Medications  Medication Dose Route Frequency Provider Last Rate Last Admin   0.9 %  sodium chloride infusion   Intravenous Continuous Troy Sine, MD 100 mL/hr at 01/06/22 1011 New Bag at 01/06/22 1011   0.9 %  sodium chloride infusion  250 mL Intravenous PRN Troy Sine, MD       acetaminophen (TYLENOL) tablet 650 mg  650 mg Oral Q6H PRN Estill Cotta, NP   650 mg at 01/06/22 3382   acetaminophen (TYLENOL) tablet 650 mg  650 mg Oral Q4H PRN Troy Sine, MD       amLODipine (NORVASC) tablet 10 mg  10 mg Oral Daily Estill Cotta, NP   10 mg at 01/05/22 0853   [START ON 01/07/2022] aspirin chewable tablet 81 mg  81 mg Oral Daily Troy Sine, MD       atorvastatin (LIPITOR) tablet 80 mg  80 mg Oral Daily Troy Sine, MD       carvedilol (COREG) tablet 12.5 mg  12.5 mg Oral BID WC Wilson Singer I, RPH   12.5 mg at 01/05/22 1717   Chlorhexidine Gluconate Cloth 2 % PADS 6 each  6 each Topical Daily Hunsucker, Bonna Gains, MD   6 each at 01/05/22 0144   diazepam (VALIUM) tablet 5 mg  5 mg Oral Q4H PRN Troy Sine, MD       diphenhydrAMINE (BENADRYL) 25 mg in sodium chloride 0.9 % 50 mL IVPB  25 mg Intravenous Q4H PRN Cherlynn June B, PA-C       docusate sodium (COLACE) capsule 100 mg  100 mg Oral BID PRN Estill Cotta, NP   100 mg at 01/05/22 2238   esmolol (BREVIBLOC) 2000 mg / 100 mL (20 mg/mL) infusion  25-300 mcg/kg/min Intravenous Continuous Hunsucker, Bonna Gains, MD   Stopped at 01/05/22  1642   heparin ADULT infusion 100 units/mL (25000 units/261mL)  1,600 Units/hr Intravenous Continuous Laren Everts, RPH 16 mL/hr at 01/06/22 0600 1,600 Units/hr at 01/06/22 0600   hydrALAZINE (APRESOLINE) injection 10 mg  10 mg Intravenous Q20 Min PRN Troy Sine, MD       hydrALAZINE (APRESOLINE) tablet 75 mg  75 mg Oral Q8H Reino Bellis B, NP   75 mg at 01/06/22 0521   insulin aspart (novoLOG) injection 0-15 Units  0-15 Units Subcutaneous TID WC Wilson Singer I, RPH   8 Units at 01/06/22 1035   insulin aspart (novoLOG) injection 0-5 Units  0-5 Units Subcutaneous QHS Wilson Singer I, [facility]   2 Units at 01/05/22 2255   insulin glargine-yfgn (SEMGLEE) injection 5 Units  5 Units Subcutaneous Daily Wilson Singer I, RPH   5 Units at 01/05/22 1018   labetalol (NORMODYNE) injection 10 mg  10 mg Intravenous Q10 min PRN Troy Sine, MD       mupirocin ointment (BACTROBAN) 2 % 1 Application  1 Application Nasal BID Hunsucker, Bonna Gains, MD   1 Application at 63/01/60 2238   naloxone [facility]) injection 0.4 mg  0.4 mg Intravenous PRN Estill Cotta, NP       ondansetron [facility]) injection 4 mg  4 mg Intravenous Q6H PRN Troy Sine, MD       Oral care mouth rinse  15 mL Mouth Rinse PRN Hunsucker, Bonna Gains, MD       polyethylene glycol (MIRALAX / GLYCOLAX) packet 17 g  17 g Oral Daily PRN Estill Cotta, NP       sodium chloride flush (NS) 0.9 % injection 3 mL  3 mL Intravenous Q12H Reino Bellis B, NP   3 mL at 01/05/22 2240   sodium chloride flush (NS) 0.9 % injection 3 mL   3 mL Intravenous Q12H Troy Sine, MD       sodium chloride flush (NS) 0.9 % injection 3 mL  3 mL Intravenous PRN Troy Sine, MD        Allergies: Allergies  Allergen Reactions   Augmentin [Amoxicillin-Pot Clavulanate] Hives and Itching    Has patient had a PCN reaction causing immediate rash, facial/tongue/throat swelling, SOB or lightheadedness with hypotension: Yes Has patient had a PCN reaction causing severe rash involving mucus membranes or skin necrosis: No Has patient had a PCN reaction that required hospitalization: No Has patient had a PCN reaction occurring within the last 10 years: No If all of the above answers are "NO", then may proceed with Cephalosporin use.   Contrast Media [Iodinated Contrast Media] Hives    Pt broke out with 1 hive on forehead after CT injection-treated with 25mg  Benadryl    Review of Systems: Cardiac Review of Systems: Y or N  Chest Pain [ y on admission ] Resting SOB [ N ] Exertional SOB [ N ] Syncope [ N ] General Review of Systems: [Y] = yes [ N]=no  Constitional:   fatigue [ Y ]; nausea [ N]; fever [N ]; or chills [ ] ;  Eye : Amaurosis fugax[ N ];  Resp:  wheezing[N ]; hemoptysis[N ];  GI: vomiting[N ]; melena[N ]; hematochezia Aqua.Slicker ];  GU: hematuria[ N];  Skin: peripheral edema[ Y-more so in RLE but no DVT]; ;  Musculosketetal: back pain[ Y-on admission ];  Heme/Lymph:  anemia[N ];  Neuro: TIA[ N];  stroke[ N];  Endocrine: diabetes[Y ]; thyroid dysfunction[ N];    BP  139/74   Pulse 75   Temp 98 F (36.7 C) (Oral)   Resp 19   Ht 5\' 11"  (1.803 m)   Wt 107.4 kg   SpO2 96%   BMI 33.02 kg/m   Physical Exam: General appearance: alert, cooperative, and no distress Neurologic: intact Heart: RRR, no mrumur Neck: No carotid bruit Lungs: clear to auscultation bilaterally Abdomen: Soft, non tender, bowel sounds present Extremities: No LE edema  Diagnostic Studies and Lab Results: Results for orders placed or performed during  the hospital encounter of 01/04/22 (from the past 48 hour(s))  CBC     Status: Abnormal   Collection Time: 01/04/22  8:37 PM  Result Value Ref Range   WBC 17.4 (H) 4.0 - 10.5 K/uL   RBC 4.57 4.22 - 5.81 MIL/uL   Hemoglobin 13.5 13.0 - 17.0 g/dL   HCT 38.8 (L) 39.0 - 52.0 %   MCV 84.9 80.0 - 100.0 fL   MCH 29.5 26.0 - 34.0 pg   MCHC 34.8 30.0 - 36.0 g/dL   RDW 12.6 11.5 - 15.5 %   Platelets 426 (H) 150 - 400 K/uL   nRBC 0.0 0.0 - 0.2 %    Comment: Performed at [facility], [address], [place], [place] [address]  Troponin I (High Sensitivity)     Status: Abnormal   Collection Time: 01/04/22  8:37 PM  Result Value Ref Range   Troponin I (High Sensitivity) 19 (H) <18 ng/L    Comment: (NOTE) Elevated high sensitivity troponin I (hsTnI) values and significant  changes across serial measurements may suggest ACS but many other  chronic and acute conditions are known to elevate hsTnI results.  Refer to the "Links" section for chest pain algorithms and additional  guidance. Performed at [facility], [place] [address] [place], [place] [address]   Comprehensive metabolic panel     Status: Abnormal   Collection Time: 01/04/22  8:37 PM  Result Value Ref Range   Sodium 138 135 - 145 mmol/L   Potassium 3.4 (L) 3.5 - 5.1 mmol/L   Chloride 102 98 - 111 mmol/L   CO2 22 22 - 32 mmol/L   Glucose, Bld 242 (H) 70 - 99 mg/dL    Comment: Glucose reference range applies only to samples taken after fasting for at least 8 hours.   BUN 24 (H) 6 - 20 mg/dL   Creatinine, Ser 1.90 (H) 0.61 - 1.24 mg/dL   Calcium 9.0 8.9 - 10.3 mg/dL   Total Protein 7.4 6.5 - 8.1 g/dL   Albumin 3.1 (L) 3.5 - 5.0 g/dL   AST 17 15 - 41 U/L   ALT 14 0 - 44 U/L   Alkaline Phosphatase 81 38 - 126 U/L   Total Bilirubin 0.3 0.3 - 1.2 mg/dL   GFR, Estimated 44 (L) >60 mL/min    Comment: (NOTE) Calculated using the CKD-EPI Creatinine Equation (2021)    Anion gap 14 5 - 15    Comment: Performed at [facility] [address], [place], [place] [address]  Lipase, blood     Status: None   Collection Time: 01/04/22  8:37 PM  Result Value Ref Range   Lipase 32 11 - 51 U/L    Comment: Performed at [facility], [place] [address], [place], [place] [address]  Troponin I (High Sensitivity)     Status: Abnormal   Collection Time: 01/04/22 10:09 PM  Result Value Ref Range  Troponin I (High Sensitivity) 22 (H) <18 ng/L    Comment: (NOTE) Elevated high sensitivity troponin I (hsTnI) values and significant  changes across serial measurements may suggest ACS but many other  chronic and acute conditions are known to elevate hsTnI results.  Refer to the "Links" section for chest pain algorithms and additional  guidance. Performed at [facility], [place] [address], [place], [place] [address]   Rapid urine drug screen (hospital performed)     Status: Abnormal   Collection Time: 01/04/22 11:43 PM  Result Value Ref Range   Opiates NONE DETECTED NONE DETECTED   Cocaine NONE DETECTED NONE DETECTED   Benzodiazepines NONE DETECTED NONE DETECTED   Amphetamines NONE DETECTED NONE DETECTED   Tetrahydrocannabinol POSITIVE (A) NONE DETECTED   Barbiturates NONE DETECTED NONE DETECTED    Comment: (NOTE) DRUG SCREEN FOR MEDICAL PURPOSES ONLY.  IF CONFIRMATION IS NEEDED FOR ANY PURPOSE, NOTIFY LAB WITHIN 5 DAYS.  LOWEST DETECTABLE LIMITS FOR URINE DRUG SCREEN Drug Class                     Cutoff (ng/mL) Amphetamine and metabolites    1000 Barbiturate and metabolites    200 Benzodiazepine                 200 Opiates and metabolites        300 Cocaine and metabolites        300 THC                            50 Performed at [facility], [place] [address] [place],  [address]   CBG monitoring, ED     Status: Abnormal   Collection Time: 01/05/22 12:08 AM  Result Value Ref Range   Glucose-Capillary 262 (H) 70 - 99 mg/dL    Comment: Glucose reference range applies only to samples  taken after fasting for at least 8 hours.  Glucose, capillary     Status: Abnormal   Collection Time: 01/05/22  1:41 AM  Result Value Ref Range   Glucose-Capillary 255 (H) 70 - 99 mg/dL    Comment: Glucose reference range applies only to samples taken after fasting for at least 8 hours.  MRSA Next Gen by PCR, Nasal     Status: Abnormal   Collection Time: 01/05/22  1:53 AM   Specimen: Nasal Mucosa; Nasal Swab  Result Value Ref Range   MRSA by PCR Next Gen DETECTED (A) NOT DETECTED    Comment: RESULT CALLED TO, READ BACK BY AND VERIFIED WITH: J SANTIAGO,RN@[id number]  01/05/22 [place] (NOTE) The GeneXpert MRSA Assay (FDA approved for NASAL specimens only), is one component of a comprehensive MRSA colonization surveillance program. It is not intended to diagnose MRSA infection nor to guide or monitor treatment for MRSA infections. Test performance is not FDA approved in patients less than 45 years old. Performed at [facility], [place] [address], [place], [place] [address]   Glucose, capillary     Status: Abnormal   Collection Time: 01/05/22  3:20 AM  Result Value Ref Range   Glucose-Capillary 277 (H) 70 - 99 mg/dL    Comment: Glucose reference range applies only to samples taken after fasting for at least 8 hours.  HIV Antibody (routine testing w rflx)     Status: None   Collection Time: 01/05/22  3:21 AM  Result Value Ref Range   HIV Screen 4th  Generation wRfx Non Reactive Non Reactive    Comment: Performed at [facility], [place] [address] [place], [place] [address]  CBC     Status: Abnormal   Collection Time: 01/05/22  3:21 AM  Result Value Ref Range   WBC 17.6 (H) 4.0 - 10.5 K/uL   RBC 4.30 4.22 - 5.81 MIL/uL   Hemoglobin 12.6 (L) 13.0 - 17.0 g/dL   HCT 35.8 (L) 39.0 - 52.0 %   MCV 83.3 80.0 - 100.0 fL   MCH 29.3 26.0 - 34.0 pg   MCHC 35.2 30.0 - 36.0 g/dL   RDW 12.4 11.5 - 15.5 %   Platelets 334 150 - 400 K/uL   nRBC 0.0 0.0 - 0.2 %    Comment: Performed at [facility], [place] [address] [place], [place] [address]  Basic metabolic panel     Status: Abnormal   Collection Time: 01/05/22  3:21 AM  Result Value Ref Range   Sodium 135 135 - 145 mmol/L   Potassium 3.4 (L) 3.5 - 5.1 mmol/L   Chloride 100 98 - 111 mmol/L   CO2 23 22 - 32 mmol/L   Glucose, Bld 277 (H) 70 - 99 mg/dL    Comment: Glucose reference range applies only to samples taken after fasting for at least 8 hours.   BUN 23 (H) 6 - 20 mg/dL   Creatinine, Ser 1.74 (H) 0.61 - 1.24 mg/dL   Calcium 8.9 8.9 - 10.3 mg/dL   GFR, Estimated 49 (L) >60 mL/min    Comment: (NOTE) Calculated using the CKD-EPI Creatinine Equation (2021)    Anion gap 12 5 - 15    Comment: Performed at [facility] [address], [place], [place] [address]  Magnesium     Status: None   Collection Time: 01/05/22  3:21 AM  Result Value Ref Range   Magnesium 1.8 1.7 - 2.4 mg/dL    Comment: Performed at [facility] [address] [place], [place] [address]  Troponin I (High Sensitivity)     Status: Abnormal   Collection Time: 01/05/22  3:21 AM  Result Value Ref Range   Troponin I (High Sensitivity) 1,719 (HH) <18 ng/L    Comment: CRITICAL RESULT CALLED TO, READ BACK BY AND VERIFIED WITH GINGER HINSON RN 01/05/22 6629 M [place] (NOTE) Elevated high sensitivity troponin I (hsTnI) values and significant  changes across serial measurements may suggest ACS but many other  chronic and acute conditions are known to elevate hsTnI results.  Refer to the "Links" section for chest pain algorithms and additional  guidance. Performed at [facility], [place] [address], [place], [place] [address]   Troponin I (High Sensitivity)     Status: Abnormal   Collection Time: 01/05/22  6:54 AM  Result Value Ref Range   Troponin I (High Sensitivity) 4,399 (HH) <18 ng/L    Comment: CRITICAL VALUE NOTED. VALUE IS CONSISTENT WITH PREVIOUSLY REPORTED/CALLED VALUE (NOTE) Elevated high sensitivity troponin I (hsTnI)  values and significant  changes across serial measurements may suggest ACS but many other  chronic and acute conditions are known to elevate hsTnI results.  Refer to the "Links" section for chest pain algorithms and additional  guidance. Performed at [facility], [place] [address], [place], [place] [address]   Glucose, capillary     Status: Abnormal   Collection Time: 01/05/22  8:07 AM  Result Value Ref Range   Glucose-Capillary 234 (H)  70 - 99 mg/dL    Comment: Glucose reference range applies only to samples taken after fasting for at least 8 hours.  Troponin I (High Sensitivity)     Status: Abnormal   Collection Time: 01/05/22  9:10 AM  Result Value Ref Range   Troponin I (High Sensitivity) 7,294 (HH) <18 ng/L    Comment: CRITICAL VALUE NOTED. VALUE IS CONSISTENT WITH PREVIOUSLY REPORTED/CALLED VALUE (NOTE) Elevated high sensitivity troponin I (hsTnI) values and significant  changes across serial measurements may suggest ACS but many other  chronic and acute conditions are known to elevate hsTnI results.  Refer to the "Links" section for chest pain algorithms and additional  guidance. Performed at [facility], [place] [address] [place], [place] [address]   Glucose, capillary     Status: Abnormal   Collection Time: 01/05/22 12:02 PM  Result Value Ref Range   Glucose-Capillary 293 (H) 70 - 99 mg/dL    Comment: Glucose reference range applies only to samples taken after fasting for at least 8 hours.  Glucose, capillary     Status: Abnormal   Collection Time: 01/05/22  3:10 PM  Result Value Ref Range   Glucose-Capillary 215 (H) 70 - 99 mg/dL    Comment: Glucose reference range applies only to samples taken after fasting for at least 8 hours.  Glucose, capillary     Status: Abnormal   Collection Time: 01/05/22  5:15 PM  Result Value Ref Range   Glucose-Capillary 200 (H) 70 - 99 mg/dL    Comment: Glucose reference range applies only to samples taken after fasting for at  least 8 hours.  Glucose, capillary     Status: Abnormal   Collection Time: 01/05/22  8:07 PM  Result Value Ref Range   Glucose-Capillary 164 (H) 70 - 99 mg/dL    Comment: Glucose reference range applies only to samples taken after fasting for at least 8 hours.  Heparin level (unfractionated)     Status: Abnormal   Collection Time: 01/05/22  9:12 PM  Result Value Ref Range   Heparin Unfractionated 0.16 (L) 0.30 - 0.70 IU/mL    Comment: (NOTE) The clinical reportable range upper limit is being lowered to >1.10 to align with the FDA approved guidance for the current laboratory assay.  If heparin results are below expected values, and patient dosage has  been confirmed, suggest follow up testing of antithrombin III levels. Performed at [facility], [place] [address], [place], [place] [address]   Glucose, capillary     Status: Abnormal   Collection Time: 01/05/22 10:37 PM  Result Value Ref Range   Glucose-Capillary 230 (H) 70 - 99 mg/dL    Comment: Glucose reference range applies only to samples taken after fasting for at least 8 hours.  Heparin level (unfractionated)     Status: Abnormal   Collection Time: 01/06/22  3:02 AM  Result Value Ref Range   Heparin Unfractionated 0.17 (L) 0.30 - 0.70 IU/mL    Comment: (NOTE) The clinical reportable range upper limit is being lowered to >1.10 to align with the FDA approved guidance for the current laboratory assay.  If heparin results are below expected values, and patient dosage has  been confirmed, suggest follow up testing of antithrombin III levels. Performed at [facility], [place] [address], [place], [place] [address]   CBC     Status: Abnormal   Collection Time: 01/06/22  3:02 AM  Result Value Ref Range   WBC  24.6 (H) 4.0 - 10.5 K/uL   RBC 3.88 (L) 4.22 - 5.81 MIL/uL   Hemoglobin 11.3 (L) 13.0 - 17.0 g/dL   HCT 33.3 (L) 39.0 - 52.0 %   MCV 85.8 80.0 - 100.0 fL   MCH 29.1 26.0 - 34.0 pg   MCHC 33.9 30.0 - 36.0 g/dL    RDW 12.9 11.5 - 15.5 %   Platelets 338 150 - 400 K/uL   nRBC 0.0 0.0 - 0.2 %    Comment: Performed at [facility], [place] [address] [place], [place] [address]  Basic metabolic panel     Status: Abnormal   Collection Time: 01/06/22  4:40 AM  Result Value Ref Range   Sodium 134 (L) 135 - 145 mmol/L   Potassium 4.4 3.5 - 5.1 mmol/L   Chloride 105 98 - 111 mmol/L   CO2 19 (L) 22 - 32 mmol/L   Glucose, Bld 236 (H) 70 - 99 mg/dL    Comment: Glucose reference range applies only to samples taken after fasting for at least 8 hours.   BUN 32 (H) 6 - 20 mg/dL   Creatinine, Ser 1.95 (H) 0.61 - 1.24 mg/dL   Calcium 8.4 (L) 8.9 - 10.3 mg/dL   GFR, Estimated 43 (L) >60 mL/min    Comment: (NOTE) Calculated using the CKD-EPI Creatinine Equation (2021)    Anion gap 10 5 - 15    Comment: Performed at [facility] [address], [place], [place] [address]  Glucose, capillary     Status: Abnormal   Collection Time: 01/06/22 10:15 AM  Result Value Ref Range   Glucose-Capillary 252 (H) 70 - 99 mg/dL    Comment: Glucose reference range applies only to samples taken after fasting for at least 8 hours.  Glucose, capillary     Status: Abnormal   Collection Time: 01/06/22 12:34 PM  Result Value Ref Range   Glucose-Capillary 191 (H) 70 - 99 mg/dL    Comment: Glucose reference range applies only to samples taken after fasting for at least 8 hours.    Cardiac Catheterization: 01/06/2022: Dominance: Right Left Anterior Descending  Prox LAD lesion is 70% stenosed.  Dist LAD lesion is 50% stenosed.    Left Circumflex  Mid Cx-1 lesion is 50% stenosed.  Mid Cx-2 lesion is 70% stenosed.    First Obtuse Marginal Branch  Vessel is small in size.  1st Mrg lesion is 50% stenosed.    Second Obtuse Marginal Branch  2nd Mrg lesion is 95% stenosed.    Fourth Obtuse Marginal Branch  Vessel is small in size.    Right Coronary Artery  Ost RCA to Prox RCA lesion is 75% stenosed.  Prox RCA lesion  is 75% stenosed.  Prox RCA to Mid RCA lesion is 95% stenosed.  Mid RCA lesion is 50% stenosed.      Coronary Diagrams  Diagnostic Dominance: Right    ECHOCARDIOGRAM COMPLETE  Result Date: 01/05/2022    ECHOCARDIOGRAM REPORT   Patient Name:   Shaun Cummings Date of Exam: 01/05/2022 Medical Rec #:  [id number]     Height:       71.0 in Accession #:    [id number]    Weight:       236.8 lb Date of Birth:  12/02/1975    BSA:          2.265 m Patient Age:    65 years      BP:  132/87 mmHg Patient Gender: M             HR:           68 bpm. Exam Location:  Inpatient Procedure: 2D Echo, Color Doppler and Cardiac Doppler STAT ECHO Indications:    abdominal aorta dissection  History:        Patient has no prior history of Echocardiogram examinations.                 Signs/Symptoms:elevated troponin; Risk Factors:Diabetes,                 Hypertension, Dyslipidemia, Sleep Apnea and Former Smoker.  Sonographer:    Johny Chess RDCS Referring Phys: [id number] VANESSA YAP IMPRESSIONS  1. Left ventricular ejection fraction, by estimation, is 35 to 40%. The left ventricle has moderately decreased function. The left ventricle demonstrates regional wall motion abnormalities (see scoring diagram/findings for description). The left ventricular internal cavity size was mildly dilated. Left ventricular diastolic parameters are consistent with Grade I diastolic dysfunction (impaired relaxation). There is hypokinesis of the left ventricular, septal wall, anterior wall and lateral wall.  There is severe hypokinesis of the left ventricular, basal-mid inferior wall.  2. Right ventricular systolic function is normal. The right ventricular size is normal.  3. Left atrial size was moderately dilated.  4. Right atrial size was mildly dilated.  5. The mitral valve is normal in structure. Trivial mitral valve regurgitation. No evidence of mitral stenosis.  6. The aortic valve is grossly normal. Aortic valve regurgitation is  not visualized. No aortic stenosis is present.  7. The inferior vena cava is normal in size with greater than 50% respiratory variability, suggesting right atrial pressure of 3 mmHg. Comparison(s): No prior Echocardiogram. Conclusion(s)/Recommendation(s): Reduced LVEF, with global hypokinesis but also severe focal hypokinesis of basal to mid inferior wall. Results will be communicated to primary team. FINDINGS  Left Ventricle: Left ventricular ejection fraction, by estimation, is 35 to 40%. The left ventricle has moderately decreased function. The left ventricle demonstrates regional wall motion abnormalities. Severe hypokinesis of the left ventricular, basal-mid inferior wall. The left ventricular internal cavity size was mildly dilated. There is borderline left ventricular hypertrophy. Left ventricular diastolic parameters are consistent with Grade I diastolic dysfunction (impaired relaxation). Right Ventricle: The right ventricular size is normal. No increase in right ventricular wall thickness. Right ventricular systolic function is normal. Left Atrium: Left atrial size was moderately dilated. Right Atrium: Right atrial size was mildly dilated. Pericardium: There is no evidence of pericardial effusion. Mitral Valve: The mitral valve is normal in structure. Trivial mitral valve regurgitation. No evidence of mitral valve stenosis. Tricuspid Valve: The tricuspid valve is normal in structure. Tricuspid valve regurgitation is trivial. No evidence of tricuspid stenosis. Aortic Valve: The aortic valve is grossly normal. Aortic valve regurgitation is not visualized. No aortic stenosis is present. Pulmonic Valve: The pulmonic valve was grossly normal. Pulmonic valve regurgitation is not visualized. No evidence of pulmonic stenosis. Aorta: The aortic root, ascending aorta and aortic arch are all structurally normal, with no evidence of dilitation or obstruction. Venous: The inferior vena cava is normal in size with  greater than 50% respiratory variability, suggesting right atrial pressure of 3 mmHg. IAS/Shunts: The atrial septum is grossly normal.  LEFT VENTRICLE PLAX 2D LVIDd:         5.75 cm      Diastology LVIDs:         4.70 cm      LV  e' medial:    5.91 cm/s LV PW:         1.10 cm      LV E/e' medial:  13.8 LV IVS:        1.20 cm      LV e' lateral:   7.15 cm/s LVOT diam:     2.30 cm      LV E/e' lateral: 11.4 LV SV:         70 LV SV Index:   31 LVOT Area:     4.15 cm  LV Volumes (MOD) LV vol d, MOD A4C: 176.0 ml LV vol s, MOD A4C: 110.0 ml LV SV MOD A4C:     176.0 ml RIGHT VENTRICLE             IVC RV Basal diam:  2.70 cm     IVC diam: 2.10 cm RV S prime:     13.40 cm/s TAPSE (M-mode): 2.8 cm LEFT ATRIUM             Index        RIGHT ATRIUM           Index LA diam:        4.20 cm 1.85 cm/m   RA Area:     18.10 cm LA Vol (A2C):   68.1 ml 30.06 ml/m  RA Volume:   49.60 ml  21.89 ml/m LA Vol (A4C):   85.8 ml 37.87 ml/m LA Biplane Vol: 82.3 ml 36.33 ml/m  AORTIC VALVE LVOT Vmax:   75.90 cm/s LVOT Vmean:  51.400 cm/s LVOT VTI:    0.168 m  AORTA Ao Asc diam: 3.40 cm MITRAL VALVE MV Area (PHT): 5.66 cm     SHUNTS MV Decel Time: 134 msec     Systemic VTI:  0.17 m MV E velocity: 81.30 cm/s   Systemic Diam: 2.30 cm MV A velocity: 101.00 cm/s MV E/A ratio:  0.80 Buford Dresser MD Electronically signed by Buford Dresser MD Signature Date/Time: 01/05/2022/8:38:23 AM    Final    DG Chest 2 View  Result Date: 01/04/2022 CLINICAL DATA:  Chest pain EXAM: CHEST - 2 VIEW COMPARISON:  CT from earlier in the same day. FINDINGS: The heart size and mediastinal contours are within normal limits. Both lungs are clear. The visualized skeletal structures are unremarkable. IMPRESSION: No active cardiopulmonary disease. Electronically Signed   By: Inez Catalina M.D.   On: 01/04/2022 22:48   CT Angio Chest/Abd/Pel for Dissection W and/or Wo Contrast  Result Date: 01/04/2022 CLINICAL DATA:  Acute aortic syndrome  suspected. EXAM: CT ANGIOGRAPHY CHEST, ABDOMEN AND PELVIS TECHNIQUE: Non-contrast CT of the chest was initially obtained. Multidetector CT imaging through the chest, abdomen and pelvis was performed using the standard protocol during bolus administration of intravenous contrast. Multiplanar reconstructed images and MIPs were obtained and reviewed to evaluate the vascular anatomy. RADIATION DOSE REDUCTION: This exam was performed according to the departmental dose-optimization program which includes automated exposure control, adjustment of the mA and/or kV according to patient size and/or use of iterative reconstruction technique. CONTRAST:  166mL OMNIPAQUE IOHEXOL 350 MG/ML SOLN COMPARISON:  06/18/2020. FINDINGS: CTA CHEST FINDINGS Cardiovascular: The heart is mildly enlarged and there is no pericardial effusion. Scattered coronary artery calcifications are present. The aorta is normal in caliber without evidence aneurysm or dissection. The pulmonary trunk is normal in caliber. Mediastinum/Nodes: No mediastinal, hilar, or axillary lymphadenopathy. The thyroid gland, trachea, and esophagus are within normal limits. Lungs/Pleura: Dependent atelectasis is noted  bilaterally. No effusion or pneumothorax. Musculoskeletal: No chest wall abnormality. No acute osseous abnormality. Review of the MIP images confirms the above findings. CTA ABDOMEN AND PELVIS FINDINGS VASCULAR Aorta: Aortic atherosclerosis with mural thrombus. There is a small penetrating ulcer on the left in the infrarenal abdominal aorta measuring 3 mm, coronal image 78. No intramural hematoma. Normal caliber aorta without aneurysm, dissection, vasculitis or significant stenosis. Celiac: Patent without evidence of aneurysm, dissection, vasculitis or significant stenosis. SMA: Patent without evidence of aneurysm, dissection, vasculitis or significant stenosis. Renals: Both renal arteries are patent without evidence of aneurysm, dissection, vasculitis,  fibromuscular dysplasia or significant stenosis. IMA: Patent. Inflow: Patent without evidence of aneurysm, dissection, vasculitis or significant stenosis. Veins: No obvious venous abnormality within the limitations of this arterial phase study. Review of the MIP images confirms the above findings. NON-VASCULAR Hepatobiliary: No focal liver abnormality is seen. No gallstones, gallbladder wall thickening, or biliary dilatation. Pancreas: Unremarkable. No pancreatic ductal dilatation or surrounding inflammatory changes. Spleen: Normal in size without focal abnormality. Adrenals/Urinary Tract: The adrenal glands are within normal limits. Nonobstructive left renal calculi. No hydronephrosis bilaterally. The bladder is unremarkable. Stomach/Bowel: Stomach is within normal limits. Appendix appears normal. No evidence of bowel wall thickening, distention, or inflammatory changes. No effusion or pneumothorax. Scattered diverticula along the colon without evidence of diverticulitis. Lymphatic: No abdominal or pelvic lymphadenopathy. Reproductive: Mildly enlarged prostate gland. Other: No abdominopelvic ascites.  Fat containing umbilical hernia. Musculoskeletal: Degenerative changes are present in the lumbar spine. No acute osseous abnormality. Review of the MIP images confirms the above findings. IMPRESSION: 1. No evidence aortic aneurysm or dissection. 2. 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 No mural hematoma. 3. Nonobstructive left renal calculi. 4. Cardiomegaly with coronary artery calcifications. 5. Colonic diverticulosis without diverticulitis. Electronically Signed   By: Brett Fairy M.D.   On: 01/04/2022 21:57    Impression and Plan: S/p NSTEMI, coronary artery disease-will be restarted on a  Heparin drip. He would benefit from coronary artery bypass grafting surgery. On schedule for Friday with Dr. Cyndia Bent,  provided improvement/stability in renal function. History of DM-on Insulin. HGA1C 12/16/2021 was 11.5. Will repeat on this admission History of hyperlipidemia-on Atorvastatin 80 mg daily History of hypertension-on Carvedilol 12.5 mg bid and Amlodipine 10 mg daily 5. History of OSA-previously used CPAP but with intentional weight loss (about 170 pounds), he no longer needs CPAP 6. AKI-creatinine this am 1.95. Avoid nephrotoxic agents and re check in am. Of note, creatinine under 1 in 2021 and 2022  [place] 01/06/2022,12:55 PM   Chart reviewed, patient examined, agree with above. This 45 year old gentleman has multiple risk factors for CAD and presents with significant NSTEMI with troponin rise from 22 to 7294. Cath shows severe 3 vessel CAD. Echo shows EF of 30-40% with no significant valvular abnormality. Creat was 1.90 on admission and decreased to 1.74 yesterday am but up to 1.95 today before cath. Creat was 1.18 last April. I agree that CABG is indicated in this young diabetic with multivessel CAD and reduced EF for relief of his symptoms and to prevent further myocardial loss. Ideally would like to have stable creat before CABG to minimize the risk of worsening renal function. Will check creat in am. Plan to do surgery Friday if renal function remains stable. I discussed the operative procedure with the patient and his wife including alternatives, benefits and risks; including but  not limited to bleeding, blood transfusion, infection, stroke, myocardial infarction, graft failure, heart block requiring a permanent pacemaker, organ dysfunction, and death.  Dennie Fetters understands and agrees to proceed.   Gaye Pollack, MD

## 2022-01-06 NOTE — Progress Notes (Signed)
[facility] Progress Note Patient Name: Gibson Lad DOB: August 07, 1975 MRN: [id number]   Date of Service  01/06/2022  HPI/Events of Note  Pain.  eICU Interventions  Plan: Dilaudid 1 mg IV Q 4 hours PRN severe pain X 2 doses.      Intervention Category Major Interventions: Other:  Lysle Dingwall 01/06/2022, 2:35 AM

## 2022-01-06 NOTE — Progress Notes (Signed)
Inpatient Diabetes Program Recommendations  AACE/ADA: New Consensus Statement on Inpatient Glycemic Control (2015)  Target Ranges:  Prepandial:   less than 140 mg/dL      Peak postprandial:   less than 180 mg/dL (1-2 hours)      Critically ill patients:  140 - 180 mg/dL   Lab Results  Component Value Date   GLUCAP 252 (H) 01/06/2022   HGBA1C 11.5 (A) 12/16/2021    Review of Glycemic Control  Latest Reference Range & Units 01/05/22 12:02 01/05/22 15:10 01/05/22 17:15 01/05/22 20:07 01/05/22 22:37 01/06/22 10:15  Glucose-Capillary 70 - 99 mg/dL 293 (H) 215 (H) 200 (H) 164 (H) 230 (H) 252 (H)  Diabetes history: DM  Outpatient Diabetes medications:  Glucotrol 5 mg with breakfast, Jardiance 10 mg q AM Current orders for Inpatient glycemic control:  Novolog 0-15 units tid with meals and HS Semglee 5 units daily  Inpatient Diabetes Program Recommendations:    While in the hospital, consider increasing Semglee to 15 units daily.   I spoke with patient on 12/11.  He does not want to be on insulin at home if possible.    Thanks,  Adah Perl, RN, BC-ADM Inpatient Diabetes Coordinator Pager [phone]  (8a-5p)

## 2022-01-06 NOTE — Interval H&P Note (Signed)
Cath Lab Visit (complete for each Cath Lab visit)  Clinical Evaluation Leading to the Procedure:   ACS: Yes.    Non-ACS:    Anginal Classification: CCS III  Anti-ischemic medical therapy: Maximal Therapy (2 or more classes of medications)  Non-Invasive Test Results: No non-invasive testing performed  Prior CABG: No previous CABG      History and Physical Interval Note:  01/06/2022 7:46 AM  Shaun Cummings  has presented today for surgery, with the diagnosis of nstemi.  The various methods of treatment have been discussed with the patient and family. After consideration of risks, benefits and other options for treatment, the patient has consented to  Procedure(s): LEFT HEART CATH AND CORONARY ANGIOGRAPHY (N/A) as a surgical intervention.  The patient's history has been reviewed, patient examined, no change in status, stable for surgery.  I have reviewed the patient's chart and labs.  Questions were answered to the patient's satisfaction.     Shelva Majestic

## 2022-01-06 NOTE — Progress Notes (Signed)
ANTICOAGULATION CONSULT NOTE - Follow Up Consult  Pharmacy Consult for heparin Indication:  NSTEMI  Labs: Recent Labs    01/04/22 2037 01/04/22 2209 01/05/22 0321 01/05/22 0654 01/05/22 0910 01/05/22 2112 01/06/22 0302 01/06/22 0440  HGB 13.5  --  12.6*  --   --   --  11.3*  --   HCT 38.8*  --  35.8*  --   --   --  33.3*  --   PLT 426*  --  334  --   --   --  338  --   HEPARINUNFRC  --   --   --   --   --  0.16* 0.17*  --   CREATININE 1.90*  --  1.74*  --   --   --   --  1.95*  TROPONINIHS 19*   < > 1,719* 4,399* 7,294*  --   --   --    < > = values in this interval not displayed.    Assessment: 46yo male subtherapeutic on heparin with initial dosing for NSTEMI; no infusion issues or signs of bleeding per RN.  Plan for cath this am.  Goal of Therapy:  Heparin level 0.3-0.7 units/ml   Plan:  Will rebolus with heparin 3000 units and increase heparin infusion by 3 units/kg/hr to 1600 units/hr and f/u after cath.    Wynona Neat, PharmD, BCPS  01/06/2022,5:16 AM

## 2022-01-06 NOTE — Progress Notes (Signed)
Refused cpap.

## 2022-01-06 NOTE — Progress Notes (Signed)
[place] for heparin gtt Indication: chest pain/ACS  Allergies  Allergen Reactions   Augmentin [Amoxicillin-Pot Clavulanate] Hives and Itching    Has patient had a PCN reaction causing immediate rash, facial/tongue/throat swelling, SOB or lightheadedness with hypotension: Yes Has patient had a PCN reaction causing severe rash involving mucus membranes or skin necrosis: No Has patient had a PCN reaction that required hospitalization: No Has patient had a PCN reaction occurring within the last 10 years: No If all of the above answers are "NO", then may proceed with Cephalosporin use.   Contrast Media [Iodinated Contrast Media] Hives    Pt broke out with 1 hive on forehead after CT injection-treated with 25mg  Benadryl    Patient Measurements: Height: 5\' 11"  (180.3 cm) Weight: 107.4 kg (236 lb 12.4 oz) IBW/kg (Calculated) : 75.3 Heparin Dosing Weight: 99.5 kg  Vital Signs: Temp: 98 F (36.7 C) (12/12 0400) Temp Source: Oral (12/12 0400) BP: 139/74 (12/12 1145) Pulse Rate: 75 (12/12 1145)  Labs: Recent Labs    01/04/22 2037 01/04/22 2209 01/05/22 0321 01/05/22 0654 01/05/22 0910 01/05/22 2112 01/06/22 0302 01/06/22 0440  HGB 13.5  --  12.6*  --   --   --  11.3*  --   HCT 38.8*  --  35.8*  --   --   --  33.3*  --   PLT 426*  --  334  --   --   --  338  --   HEPARINUNFRC  --   --   --   --   --  0.16* 0.17*  --   CREATININE 1.90*  --  1.74*  --   --   --   --  1.95*  TROPONINIHS 19*   < > 1,719* 4,399* 7,294*  --   --   --    < > = values in this interval not displayed.     Estimated Creatinine Clearance: 60.2 mL/min (A) (by C-G formula based on SCr of 1.95 mg/dL (H)).   Medical History: Past Medical History:  Diagnosis Date   Chest pain    HTN (hypertension)    Hyperlipidemia    Lesion of penis    foreskin   OSA (obstructive sleep apnea)    per pt dx osa and used cpap but after losing wt. from 415 pounds down to 244 pounds  no longer needs cpap   Peripheral neuropathy    Phimosis    Scrotal abscess 05/08/2017   Type 2 diabetes mellitus ([place])    per pt no meds for two years pt was changed to levemir unable to afford but pt states is waiting on approval for a program he applied for that will pay for his meds ([place])      Assessment: 46 yo M with c/f NSTEMI with elevated troponins and EKG changes. No AC PTA.  Cath 12/12 finding multivessel CAD - plan to restart heparin 8 hours after sheath removal (documented on 12/12@0940 ). CBC stable. No s/sx of bleeding.   Goal of Therapy:  Heparin level 0.3-0.7 units/ml Monitor platelets by anticoagulation protocol: Yes   Plan:  Will restart heparin infusion at 1600 units/hr on 12/12 at 1800 6hr HL after restart Daily HL, CBC F/u cardiology plans  Antonietta Jewel, PharmD, [place] Pharmacist  Phone: [phone] 01/06/2022 12:54 PM  Please check AMION for all [facility] phone numbers After 10:00 PM, call [place] [phone]

## 2022-01-06 NOTE — Progress Notes (Signed)
NAME:  Shaun Cummings, MRN:  [id number], DOB:  September 22, 1975, LOS: 2 ADMISSION DATE:  01/04/2022, CONSULTATION DATE:  12/10 REFERRING MD:  Nechama Guard, CHIEF COMPLAINT: Chest pain   History of Present Illness:  46 y/o M who presented to the [facility] ER with a chief complaint of chest pain  They have a pertinent past medical history of HTN, HLD, OSA, DM2  ED course was notable for blood pressure of 206/111, troponin 19, CTA chest abdomen pelvis with mall 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 No mural hematoma.  No evidence of aortic aneurysm or dissection.  Creatinine 1.9.  Vascular surgery was consulted in the emergency department.  Recommends medical management goal systolic blood pressure less than 120.  Started on esmolol drip.  PCCM was consulted for admission.  Pertinent  Medical History  HTN, HLD, OSA, DM2  Significant Hospital Events: Including procedures, antibiotic start and stop dates in addition to other pertinent events   12/10 presented to [facility]. CTA chest abd pelvis-  CTA chest abdomen pelvis with mall 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, VVS consult, PCCM consult 12/11 Denies pain today. Foot numbness better than a month ago, not worse. Echo with LVEF 35-40% with hypokinesis of the LV septal, anterior, & lateral walls. Severe hypokinesis oif the LV basal mid inferior wall. G1DD. LA moderately dilated, RA mildly dilated. Normal RV function.  12/12 LHC > severe multivessel disease, surgical consultation recommended  Interim History / Subjective:  Afebrile LHC completed > severe multivessel disease, surgical consultation recommended Pt reports need to stand to use the restroom, unable to lie flat and urinate Denies pain, SOB. States he is hungry.  Objective   Blood pressure 139/74, pulse 75, temperature 98 F (36.7 C), temperature source Oral, resp. rate 19, height 5\' 11"  (1.803 m), weight 107.4 kg, SpO2 96 %.         Intake/Output Summary (Last 24 hours) at 01/06/2022 1345 Last data filed at 01/06/2022 0600 Gross per 24 hour  Intake 3146.15 ml  Output 800 ml  Net 2346.15 ml   Filed Weights   01/04/22 2009 01/05/22 0500  Weight: 112 kg 107.4 kg    Examination: General: adult male lying in bed in NAD  HEENT: MM pink/moist, anicteric, tattoo behind right Neuro: AAOx4, speech clear, MAE CV: s1s2 RRR, no m/r/g PULM: non-labored at rest, lungs bilaterally clear GI: soft, bsx4 active  Extremities: warm/dry, no edema  Skin: no rashes or lesions  Resolved Hospital Problem list     Assessment & Plan:   Hypertensive Emergency 73mm 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  History of HTN -remove esmolol from [facility] -continue coreg, amlodipine, hydralazine Q8 & PRN labetalol  -SBP goal <120, HR<60  Severe Multivessel CAD Troponin elevation with anterolateral TWI progressing on EKG Chest Pain - worry about ACS vs secondary to HTN and ulcer Acute HFrEF; concern is for ICM with EKG changes and trop elevation -strict BP/HR control as above  -appreciate Cardiology / TCTS  -increased risk with heparin infusion with aortic syndrome   AKI potentially due to Hypertensive Emergency Hypokalemia -BP control as above  -Trend BMP / urinary output -Monitor electrolytes, replace as indicated -Avoid nephrotoxic agents, ensure adequate renal perfusion  DM2- uncontrolled A1c 11.5 in November. -s/p steroids for contrast  -SSI TID with meals, & QHS  -Glargine 5 units QD  OSA -CPAP QHS   Former Tobacco Abuse  1ppd x 34 yrs -cessation counseling   Best  Practice (right click and "Reselect all SmartList Selections" daily)  Diet/type: Regular consistency (see orders) DVT prophylaxis: prophylactic heparin  GI prophylaxis: N/A Lines: N/A Foley:  N/A Code Status:  full code Last date of multidisciplinary goals of care discussion: completed 12/10. Full scope care.   To TRH as of 12/13.         Noe Gens, MSN, APRN, NP-C, AGACNP-BC [facility] 01/06/2022, 1:46 PM   Please see [email or web address] for pager details.   From 7A-7P if no response, please call [phone] After hours, please call [facility] [phone]

## 2022-01-07 ENCOUNTER — Inpatient Hospital Stay (HOSPITAL_COMMUNITY): Payer: 59

## 2022-01-07 DIAGNOSIS — R109 Unspecified abdominal pain: Secondary | ICD-10-CM

## 2022-01-07 DIAGNOSIS — I2510 Atherosclerotic heart disease of native coronary artery without angina pectoris: Secondary | ICD-10-CM

## 2022-01-07 DIAGNOSIS — N179 Acute kidney failure, unspecified: Secondary | ICD-10-CM | POA: Diagnosis not present

## 2022-01-07 DIAGNOSIS — I214 Non-ST elevation (NSTEMI) myocardial infarction: Secondary | ICD-10-CM | POA: Diagnosis not present

## 2022-01-07 DIAGNOSIS — I719 Aortic aneurysm of unspecified site, without rupture: Secondary | ICD-10-CM | POA: Diagnosis not present

## 2022-01-07 DIAGNOSIS — I161 Hypertensive emergency: Secondary | ICD-10-CM | POA: Diagnosis not present

## 2022-01-07 DIAGNOSIS — I7143 Infrarenal abdominal aortic aneurysm, without rupture: Secondary | ICD-10-CM | POA: Diagnosis not present

## 2022-01-07 HISTORY — DX: Unspecified abdominal pain: R10.9

## 2022-01-07 LAB — BASIC METABOLIC PANEL
Anion gap: 9 (ref 5–15)
Anion gap: 9 (ref 5–15)
BUN: 38 mg/dL — ABNORMAL HIGH (ref 6–20)
BUN: 40 mg/dL — ABNORMAL HIGH (ref 6–20)
CO2: 19 mmol/L — ABNORMAL LOW (ref 22–32)
CO2: 19 mmol/L — ABNORMAL LOW (ref 22–32)
Calcium: 8.4 mg/dL — ABNORMAL LOW (ref 8.9–10.3)
Calcium: 8.2 mg/dL — ABNORMAL LOW (ref 8.9–10.3)
Chloride: 104 mmol/L (ref 98–111)
Chloride: 105 mmol/L (ref 98–111)
Creatinine, Ser: 2.25 mg/dL — ABNORMAL HIGH (ref 0.61–1.24)
Creatinine, Ser: 2.42 mg/dL — ABNORMAL HIGH (ref 0.61–1.24)
GFR, Estimated: 36 mL/min — ABNORMAL LOW (ref >60)
GFR, Estimated: 33 mL/min — ABNORMAL LOW (ref >60)
Glucose, Bld: 220 mg/dL — ABNORMAL HIGH (ref 70–99)
Glucose, Bld: 165 mg/dL — ABNORMAL HIGH (ref 70–99)
Potassium: 3.7 mmol/L (ref 3.5–5.1)
Potassium: 3.6 mmol/L (ref 3.5–5.1)
Sodium: 132 mmol/L — ABNORMAL LOW (ref 135–145)
Sodium: 133 mmol/L — ABNORMAL LOW (ref 135–145)

## 2022-01-07 LAB — CBC WITH DIFFERENTIAL/PLATELET
Abs Immature Granulocytes: 0.24 10*3/uL — ABNORMAL HIGH (ref 0.00–0.07)
Basophils Absolute: 0 10*3/uL (ref 0.0–0.1)
Basophils Relative: 0 %
Eosinophils Absolute: 0 10*3/uL (ref 0.0–0.5)
Eosinophils Relative: 0 %
HCT: 29.2 % — ABNORMAL LOW (ref 39.0–52.0)
Hemoglobin: 10.4 g/dL — ABNORMAL LOW (ref 13.0–17.0)
Immature Granulocytes: 1 %
Lymphocytes Relative: 5 %
Lymphs Abs: 1.1 10*3/uL (ref 0.7–4.0)
MCH: 29.8 pg (ref 26.0–34.0)
MCHC: 35.6 g/dL (ref 30.0–36.0)
MCV: 83.7 fL (ref 80.0–100.0)
Monocytes Absolute: 1.8 10*3/uL — ABNORMAL HIGH (ref 0.1–1.0)
Monocytes Relative: 8 %
Neutro Abs: 18.9 10*3/uL — ABNORMAL HIGH (ref 1.7–7.7)
Neutrophils Relative %: 86 %
Platelets: 295 10*3/uL (ref 150–400)
RBC: 3.49 MIL/uL — ABNORMAL LOW (ref 4.22–5.81)
RDW: 13.2 % (ref 11.5–15.5)
WBC: 22 10*3/uL — ABNORMAL HIGH (ref 4.0–10.5)
nRBC: 0 % (ref 0.0–0.2)

## 2022-01-07 LAB — HEPARIN LEVEL (UNFRACTIONATED)
Heparin Unfractionated: 0.17 IU/mL — ABNORMAL LOW (ref 0.30–0.70)
Heparin Unfractionated: 0.21 IU/mL — ABNORMAL LOW (ref 0.30–0.70)

## 2022-01-07 LAB — GLUCOSE, CAPILLARY
Glucose-Capillary: 232 mg/dL — ABNORMAL HIGH (ref 70–99)
Glucose-Capillary: 191 mg/dL — ABNORMAL HIGH (ref 70–99)
Glucose-Capillary: 182 mg/dL — ABNORMAL HIGH (ref 70–99)
Glucose-Capillary: 149 mg/dL — ABNORMAL HIGH (ref 70–99)

## 2022-01-07 LAB — PROCALCITONIN: Procalcitonin: 0.28 ng/mL

## 2022-01-07 LAB — LIPASE, BLOOD: Lipase: 24 U/L (ref 11–51)

## 2022-01-07 MED ORDER — HYDROMORPHONE HCL 1 MG/ML IJ SOLN
1.0000 mg | INTRAMUSCULAR | Status: DC | PRN
  Administered 2022-01-07 – 2022-01-13 (×7): 1 mg via INTRAVENOUS
  Filled 2022-01-07 (×8): qty 1

## 2022-01-07 MED ORDER — HYDROMORPHONE HCL 1 MG/ML IJ SOLN
0.5000 mg | Freq: Once | INTRAMUSCULAR | Status: AC
  Administered 2022-01-07: 0.5 mg via INTRAVENOUS
  Filled 2022-01-07: qty 1

## 2022-01-07 MED ORDER — PANTOPRAZOLE SODIUM 40 MG IV SOLR
40.0000 mg | Freq: Two times a day (BID) | INTRAVENOUS | Status: DC
  Administered 2022-01-07 – 2022-01-11 (×9): 40 mg via INTRAVENOUS
  Filled 2022-01-07 (×10): qty 10

## 2022-01-07 MED ORDER — POLYETHYLENE GLYCOL 3350 17 G PO PACK
17.0000 g | PACK | Freq: Once | ORAL | Status: AC
  Administered 2022-01-07: 17 g via ORAL
  Filled 2022-01-07: qty 1

## 2022-01-07 MED ORDER — SODIUM CHLORIDE 0.9 % IV SOLN: INTRAVENOUS | Status: DC

## 2022-01-07 MED ORDER — STERILE WATER FOR INJECTION IV SOLN
INTRAVENOUS | Status: AC
  Filled 2022-01-07 (×2): qty 1000

## 2022-01-07 MED ORDER — INSULIN GLARGINE-YFGN 100 UNIT/ML ~~LOC~~ SOLN
15.0000 [IU] | Freq: Every day | SUBCUTANEOUS | Status: DC
  Administered 2022-01-08 – 2022-01-09 (×2): 15 [IU] via SUBCUTANEOUS
  Filled 2022-01-07 (×2): qty 0.15

## 2022-01-07 MED ORDER — SORBITOL 70 % SOLN
960.0000 mL | TOPICAL_OIL | Freq: Once | ORAL | Status: AC
  Administered 2022-01-07: 960 mL via RECTAL
  Filled 2022-01-07: qty 240

## 2022-01-07 MED ORDER — INSULIN GLARGINE-YFGN 100 UNIT/ML ~~LOC~~ SOLN
10.0000 [IU] | Freq: Once | SUBCUTANEOUS | Status: AC
  Administered 2022-01-07: 10 [IU] via SUBCUTANEOUS
  Filled 2022-01-07: qty 0.1

## 2022-01-07 MED ORDER — DOCUSATE SODIUM 100 MG PO CAPS
100.0000 mg | ORAL_CAPSULE | Freq: Two times a day (BID) | ORAL | Status: DC
  Administered 2022-01-07 – 2022-01-08 (×3): 100 mg via ORAL
  Filled 2022-01-07 (×3): qty 1

## 2022-01-07 NOTE — Progress Notes (Signed)
[place] for heparin gtt Indication: chest pain/ACS  Allergies  Allergen Reactions   Augmentin [Amoxicillin-Pot Clavulanate] Hives and Itching    Has patient had a PCN reaction causing immediate rash, facial/tongue/throat swelling, SOB or lightheadedness with hypotension: Yes Has patient had a PCN reaction causing severe rash involving mucus membranes or skin necrosis: No Has patient had a PCN reaction that required hospitalization: No Has patient had a PCN reaction occurring within the last 10 years: No If all of the above answers are "NO", then may proceed with Cephalosporin use.   Contrast Media [Iodinated Contrast Media] Hives    Pt broke out with 1 hive on forehead after CT injection-treated with 25mg  Benadryl    Patient Measurements: Height: 5\' 11"  (180.3 cm) Weight: 111.1 kg (244 lb 14.4 oz) IBW/kg (Calculated) : 75.3 Heparin Dosing Weight: 99.5 kg  Vital Signs: Temp: 98.5 F (36.9 C) (12/13 1142) Temp Source: Oral (12/13 1142) BP: 116/69 (12/13 1418) Pulse Rate: 77 (12/13 1142)  Labs: Recent Labs    01/05/22 0321 01/05/22 0654 01/05/22 0910 01/05/22 2112 01/06/22 0302 01/06/22 0440 01/07/22 0809 01/07/22 1008 01/07/22 1455 01/07/22 1509  HGB 12.6*  --   --   --  11.3*  --   --  10.4*  --   --   HCT 35.8*  --   --   --  33.3*  --   --  29.2*  --   --   PLT 334  --   --   --  338  --   --  295  --   --   HEPARINUNFRC  --   --   --    < > 0.17*  --  0.17*  --  0.21*  --   CREATININE 1.74*  --   --   --   --  1.95* 2.25*  --   --  2.42*  TROPONINIHS 1,719* 4,399* 7,294*  --   --   --   --   --   --   --    < > = values in this interval not displayed.     Estimated Creatinine Clearance: 49.4 mL/min (A) (by C-G formula based on SCr of 2.42 mg/dL (H)).   Medical History: Past Medical History:  Diagnosis Date   Chest pain    HTN (hypertension)    Hyperlipidemia    Lesion of penis    foreskin   OSA (obstructive sleep  apnea)    per pt dx osa and used cpap but after losing wt. from 415 pounds down to 244 pounds no longer needs cpap   Peripheral neuropathy    Phimosis    Scrotal abscess 05/08/2017   Type 2 diabetes mellitus ([place])    per pt no meds for two years pt was changed to levemir unable to afford but pt states is waiting on approval for a program he applied for that will pay for his meds ([place])      Assessment: 46 yo M with c/f NSTEMI with elevated troponins and EKG changes. No AC PTA.  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 Heparin level came back subtherapeutic at 0.17, on 1600 units/hr. No s/sx of bleeding or infusion issues.  Heparin came back subtherapeutic again. We will increase rate again and check 8 hr HL. Goal of Therapy:  Heparin level 0.3-0.7 units/ml Monitor platelets by anticoagulation protocol: Yes   Plan:  Will increase heparin infusion to  2100 units/hr  8hr HL after rate increase Daily HL, CBC F/u cardiology plans  Onnie Boer, PharmD, BCIDP, AAHIVP, CPP Infectious Disease Pharmacist 01/07/2022 4:30 PM

## 2022-01-07 NOTE — Progress Notes (Signed)
Pt on Hep gtt. Refused lab citing pain. Received PRN Tyl  and Diazepam as ordered. Continues to request pain med stronger than tylenol. Paged PCCM on-call with provider and received order for one-time dose of dilaudid.

## 2022-01-07 NOTE — Progress Notes (Signed)
[place] for heparin gtt Indication: chest pain/ACS  Allergies  Allergen Reactions   Augmentin [Amoxicillin-Pot Clavulanate] Hives and Itching    Has patient had a PCN reaction causing immediate rash, facial/tongue/throat swelling, SOB or lightheadedness with hypotension: Yes Has patient had a PCN reaction causing severe rash involving mucus membranes or skin necrosis: No Has patient had a PCN reaction that required hospitalization: No Has patient had a PCN reaction occurring within the last 10 years: No If all of the above answers are "NO", then may proceed with Cephalosporin use.   Contrast Media [Iodinated Contrast Media] Hives    Pt broke out with 1 hive on forehead after CT injection-treated with 25mg  Benadryl    Patient Measurements: Height: 5\' 11"  (180.3 cm) Weight: 111.1 kg (244 lb 14.4 oz) IBW/kg (Calculated) : 75.3 Heparin Dosing Weight: 99.5 kg  Vital Signs: Temp: 98.1 F (36.7 C) (12/13 0838) Temp Source: Oral (12/13 0838) BP: 140/78 (12/13 0838) Pulse Rate: 86 (12/13 0838)  Labs: Recent Labs    01/04/22 2037 01/04/22 2209 01/05/22 0321 01/05/22 0654 01/05/22 0910 01/05/22 2112 01/06/22 0302 01/06/22 0440 01/07/22 0809  HGB 13.5  --  12.6*  --   --   --  11.3*  --   --   HCT 38.8*  --  35.8*  --   --   --  33.3*  --   --   PLT 426*  --  334  --   --   --  338  --   --   HEPARINUNFRC  --   --   --   --   --  0.16* 0.17*  --  0.17*  CREATININE 1.90*  --  1.74*  --   --   --   --  1.95* 2.25*  TROPONINIHS 19*   < > 1,719* 4,399* 7,294*  --   --   --   --    < > = values in this interval not displayed.     Estimated Creatinine Clearance: 53.1 mL/min (A) (by C-G formula based on SCr of 2.25 mg/dL (H)).   Medical History: Past Medical History:  Diagnosis Date   Chest pain    HTN (hypertension)    Hyperlipidemia    Lesion of penis    foreskin   OSA (obstructive sleep apnea)    per pt dx osa and used cpap but  after losing wt. from 415 pounds down to 244 pounds no longer needs cpap   Peripheral neuropathy    Phimosis    Scrotal abscess 05/08/2017   Type 2 diabetes mellitus ([place])    per pt no meds for two years pt was changed to levemir unable to afford but pt states is waiting on approval for a program he applied for that will pay for his meds ([place])      Assessment: 46 yo M with c/f NSTEMI with elevated troponins and EKG changes. No AC PTA.  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 Heparin level came back subtherapeutic at 0.17, on 1600 units/hr. No s/sx of bleeding or infusion issues.  Goal of Therapy:  Heparin level 0.3-0.7 units/ml Monitor platelets by anticoagulation protocol: Yes   Plan:  Will increase heparin infusion to 1800 units/hr  6hr HL after rate increase Daily HL, CBC F/u cardiology plans  Antonietta Jewel, PharmD, [place] Clinical Pharmacist  Phone: [phone] 01/07/2022 9:17 AM  Please check AMION for all [facility] phone numbers  After 10:00 PM, call [place] [phone]

## 2022-01-07 NOTE — Progress Notes (Signed)
Patient complaining of severe abdominal pain. Notified MD.

## 2022-01-07 NOTE — Progress Notes (Signed)
Pt in bed with considerable pain (RN and MD aware). Will hold ambulation at this time. Wife in room and attentive to ed, pt did not open eyes but listened to education. Discussed IS, sternal precautions, mobility post op, and d/c planning. Gave pt OHS booklet and IS (1500 ml lying flat). Also gave videos to view.  [place], ACSM-CEP 01/07/2022 2:43 PM

## 2022-01-07 NOTE — Progress Notes (Addendum)
Rounding Note    Patient Name: Shaun Cummings Date of Encounter: 01/07/2022  [place] Cardiologist: Freada Bergeron, MD   Subjective   No chest pain, lots of epigastric pain. No nausea/vomiting. No BM since the weekend. Primary team at the bedside.   Inpatient Medications    Scheduled Meds:  amLODipine  10 mg Oral Daily   aspirin  81 mg Oral Daily   atorvastatin  80 mg Oral Daily   carvedilol  12.5 mg Oral BID WC   Chlorhexidine Gluconate Cloth  6 each Topical Daily   hydrALAZINE  75 mg Oral Q8H   insulin aspart  0-15 Units Subcutaneous TID WC   insulin aspart  0-5 Units Subcutaneous QHS   insulin glargine-yfgn  5 Units Subcutaneous Daily   mupirocin ointment  1 Application Nasal BID   sodium chloride flush  3 mL Intravenous Q12H   sodium chloride flush  3 mL Intravenous Q12H   Continuous Infusions:  sodium chloride     diphenhydrAMINE     heparin 1,600 Units/hr (01/07/22 0711)   PRN Meds: sodium chloride, acetaminophen, acetaminophen, diazepam, diphenhydrAMINE, docusate sodium, naLOXone (NARCAN)  injection, ondansetron (ZOFRAN) IV, mouth rinse, polyethylene glycol, sodium chloride flush   Vital Signs    Vitals:   01/06/22 2129 01/07/22 0500 01/07/22 0520 01/07/22 0838  BP: 121/70  (!) 141/76 (!) 140/78  Pulse: 78  80 86  Resp:    20  Temp: 98.3 F (36.8 C)  98.4 F (36.9 C) 98.1 F (36.7 C)  TempSrc: Oral   Oral  SpO2: 94%  92% 95%  Weight:  111.1 kg    Height:        Intake/Output Summary (Last 24 hours) at 01/07/2022 0921 Last data filed at 01/07/2022 0645 Gross per 24 hour  Intake 2394.39 ml  Output 300 ml  Net 2094.39 ml      01/07/2022    5:00 AM 01/05/2022    5:00 AM 01/04/2022    8:09 PM  Last 3 Weights  Weight (lbs) 244 lb 14.4 oz 236 lb 12.4 oz 247 lb  Weight (kg) 111.086 kg 107.4 kg 112.038 kg      Telemetry    Sinus Rhythm - Personally Reviewed  ECG    Sinus Rhythm, 82 bpm TWI in lateral leads, q wave in lead  III - Personally Reviewed  Physical Exam   GEN: No acute distress.   Neck: No JVD Cardiac: RRR, no murmurs, rubs, or gallops.  Respiratory: Clear to auscultation bilaterally. GI: Soft, + tenderness in epigastric area, non-distended  MS: No edema; No deformity. Right radial cath site stable Neuro:  Nonfocal  Psych: Normal affect   Labs    High Sensitivity Troponin:   Recent Labs  Lab 01/04/22 2037 01/04/22 2209 01/05/22 0321 01/05/22 0654 01/05/22 0910  TROPONINIHS 19* 22* 1,719* 4,399* 7,294*     Chemistry Recent Labs  Lab 01/04/22 2037 01/05/22 0321 01/06/22 0440 01/07/22 0809  NA 138 135 134* 132*  K 3.4* 3.4* 4.4 3.7  CL 102 100 105 104  CO2 22 23 19* 19*  GLUCOSE 242* 277* 236* 220*  BUN 24* 23* 32* 38*  CREATININE 1.90* 1.74* 1.95* 2.25*  CALCIUM 9.0 8.9 8.4* 8.4*  MG  --  1.8  --   --   PROT 7.4  --   --   --   ALBUMIN 3.1*  --   --   --   AST 17  --   --   --  ALT 14  --   --   --   ALKPHOS 81  --   --   --   BILITOT 0.3  --   --   --   GFRNONAA 44* 49* 43* 36*  ANIONGAP 14 12 10 9     Lipids No results for input(s): "CHOL", "TRIG", "HDL", "LABVLDL", "LDLCALC", "CHOLHDL" in the last 168 hours.  Hematology Recent Labs  Lab 01/04/22 2037 01/05/22 0321 01/06/22 0302  WBC 17.4* 17.6* 24.6*  RBC 4.57 4.30 3.88*  HGB 13.5 12.6* 11.3*  HCT 38.8* 35.8* 33.3*  MCV 84.9 83.3 85.8  MCH 29.5 29.3 29.1  MCHC 34.8 35.2 33.9  RDW 12.6 12.4 12.9  PLT 426* 334 338   Thyroid No results for input(s): "TSH", "FREET4" in the last 168 hours.  BNPNo results for input(s): "BNP", "PROBNP" in the last 168 hours.  DDimer No results for input(s): "DDIMER" in the last 168 hours.   Radiology    CARDIAC CATHETERIZATION  Result Date: 01/06/2022   Dist LAD lesion is 50% stenosed.   Prox LAD lesion is 70% stenosed.   Ost RCA to Prox RCA lesion is 75% stenosed.   Prox RCA lesion is 75% stenosed.   Prox RCA to Mid RCA lesion is 95% stenosed.   Mid RCA lesion is 50%  stenosed.   1st Mrg lesion is 50% stenosed.   Mid Cx-2 lesion is 70% stenosed.   2nd Mrg lesion is 95% stenosed.   Mid Cx-1 lesion is 50% stenosed. Severe multivessel CAD with the dominant RCA being the "culprit" vessel contributing to the non-STEMI with 70% diffuse proximal stenosis followed by 70% and 95% thrombotic mid stenosis and 50% distal stenosis. There is 70% diffuse proximal LAD stenosis with diffuse 50% distal LAD stenosis. The left circumflex vessel has diffuse 50% stenosis in the small OM 2 vessel with 95% focal stenosis in the OM 3 vessel and bifurcation AV groove 50% stenosis followed by 70% distal stenosis. LVEDP elevated at 27 mm. RECOMMENDATION: Surgical consultation will be recommended for CABG revascularization.  Will resume heparin 8 hours post sheath discontinuance.  Initiate medical therapy for significant CAD and aggressive lipid-lowering therapy with target LDL less than 55.  Smoking cessation is essential.   Cardiac Studies   Cath: 01/06/2022    Dist LAD lesion is 50% stenosed.   Prox LAD lesion is 70% stenosed.   Ost RCA to Prox RCA lesion is 75% stenosed.   Prox RCA lesion is 75% stenosed.   Prox RCA to Mid RCA lesion is 95% stenosed.   Mid RCA lesion is 50% stenosed.   1st Mrg lesion is 50% stenosed.   Mid Cx-2 lesion is 70% stenosed.   2nd Mrg lesion is 95% stenosed.   Mid Cx-1 lesion is 50% stenosed.   Severe multivessel CAD with the dominant RCA being the "culprit" vessel contributing to the non-STEMI with 70% diffuse proximal stenosis followed by 70% and 95% thrombotic mid stenosis and 50% distal stenosis.   There is 70% diffuse proximal LAD stenosis with diffuse 50% distal LAD stenosis.   The left circumflex vessel has diffuse 50% stenosis in the small OM 2 vessel with 95% focal stenosis in the OM 3 vessel and bifurcation AV groove 50% stenosis followed by 70% distal stenosis.   LVEDP elevated at 27 mm.   RECOMMENDATION: Surgical consultation will be  recommended for CABG revascularization.  Will resume heparin 8 hours post sheath discontinuance.  Initiate medical therapy for significant CAD and  aggressive lipid-lowering therapy with target LDL less than 55.  Smoking cessation is essential.    Diagnostic Dominance: Right  Echo: 01/05/2022  IMPRESSIONS     1. Left ventricular ejection fraction, by estimation, is 35 to 40%. The  left ventricle has moderately decreased function. The left ventricle  demonstrates regional wall motion abnormalities (see scoring  diagram/findings for description). The left  ventricular internal cavity size was mildly dilated. Left ventricular  diastolic parameters are consistent with Grade I diastolic dysfunction  (impaired relaxation). There is hypokinesis of the left ventricular,  septal wall, anterior wall and lateral wall.   There is severe hypokinesis of the left ventricular, basal-mid inferior  wall.   2. Right ventricular systolic function is normal. The right ventricular  size is normal.   3. Left atrial size was moderately dilated.   4. Right atrial size was mildly dilated.   5. The mitral valve is normal in structure. Trivial mitral valve  regurgitation. No evidence of mitral stenosis.   6. The aortic valve is grossly normal. Aortic valve regurgitation is not  visualized. No aortic stenosis is present.   7. The inferior vena cava is normal in size with greater than 50%  respiratory variability, suggesting right atrial pressure of 3 mmHg.   Comparison(s): No prior Echocardiogram.   Conclusion(s)/Recommendation(s): Reduced LVEF, with global hypokinesis but  also severe focal hypokinesis of basal to mid inferior wall. Results will  be communicated to primary team.   FINDINGS   Left Ventricle: Left ventricular ejection fraction, by estimation, is 35  to 40%. The left ventricle has moderately decreased function. The left  ventricle demonstrates regional wall motion abnormalities. Severe   hypokinesis of the left ventricular,  basal-mid inferior wall. The left ventricular internal cavity size was  mildly dilated. There is borderline left ventricular hypertrophy. Left  ventricular diastolic parameters are consistent with Grade I diastolic  dysfunction (impaired relaxation).   Right Ventricle: The right ventricular size is normal. No increase in  right ventricular wall thickness. Right ventricular systolic function is  normal.   Left Atrium: Left atrial size was moderately dilated.   Right Atrium: Right atrial size was mildly dilated.   Pericardium: There is no evidence of pericardial effusion.   Mitral Valve: The mitral valve is normal in structure. Trivial mitral  valve regurgitation. No evidence of mitral valve stenosis.   Tricuspid Valve: The tricuspid valve is normal in structure. Tricuspid  valve regurgitation is trivial. No evidence of tricuspid stenosis.   Aortic Valve: The aortic valve is grossly normal. Aortic valve  regurgitation is not visualized. No aortic stenosis is present.   Pulmonic Valve: The pulmonic valve was grossly normal. Pulmonic valve  regurgitation is not visualized. No evidence of pulmonic stenosis.   Aorta: The aortic root, ascending aorta and aortic arch are all  structurally normal, with no evidence of dilitation or obstruction.   Venous: The inferior vena cava is normal in size with greater than 50%  respiratory variability, suggesting right atrial pressure of 3 mmHg.   IAS/Shunts: The atrial septum is grossly normal.   Patient Profile     46 y.o. male with a hx of HTN, HLD, DM and OSA who is being seen 01/05/2022 for the evaluation of elevated troponin at the request of Dr. Carlis Abbott.   Assessment & Plan    NSTEMI  -- hsTn initially 19, continues to trend up with last value 7294. EKG with TWI in lateral leads, q waves in lead III.  --  CT a/p with coronary calcifications noted -- underwent cardiac cath 12/12 noted above with  severe multivessel CAD with dominant RCA being culprit vessel with 70% diffuse proximal stenosis followed by 70 and 95% thrombotic mid stenosis, 70% proximal LAD stenosis with 50% distal LAD stenosis, 50% stenosis in small OM 2, 95% focal stenosis in OM 3 and bifurcation AV groove 50% stenosis.  Seen by TCTS with recommendations for CABG pending renal function. -- Continue IV heparin, aspirin, amlodipine, atorvastatin 80, carvedilol 12.5 mg twice daily, hydralazine 75 mg 3 times daily  HFrEF ICM -- Echo notes LVEF of 35-40%, global hypokinesis but more severe in basal-inferior wall -- GDMT: continue coreg, hydralazine. Unable to add ARB/ANRi presently with elevated Cr.  On Jardiance PTA, given his elevated hemoglobin A1c will defer for now   Hypertensive emergency -- Systolic BP greater than 832 on admission, initially treated with Cleviprex and esmolol drip.  Weaned from drips. -- Continue amlodipine 10mg  daily, hydralazine 75 mg 3 times daily, Coreg 12.5mg  BID   Penetrating abdominal aortic ulcer -- Initially presented with abdominal/back pain.  Mural thrombus of abdominal aorta with small penetrating ulcer in the infrarenal abdominal aorta measuring 3 mm with no mural hematoma. -- Evaluated by VVS with recommendations for pain management and blood pressure control, repeat scan 4 to 6 weeks as long as no recurrent pain.   AKI in the setting of hypertensive emergency -- Cr 1.90>>1.74>>1.95>>2.25 -- follow BMET   Epigastric pain Leukocytosis -- initially had pain on admission, improved. Now returned. No nausea/vomiting. No BM since the weekend. Does have hx of gastric ulcers -- WBC 17.6>>24.6 yesterday (though received several doses of steroids) -- lipase, CBC w/diff., procal, imaging ordered per primary. May need GI evaluation given need for CABG -- ordered for PPI   DM -- Hgb A1c 11.5 11/2021 -- on SSI while inpatient, adjust appropriately -- jardiance/glipizide outpatient, holding  for now   Prior tobacco use -- reports cessation 3 weeks ago  For questions or updates, please contact [place] Please consult [email or web address] for contact info under    Signed, Reino Bellis, NP  01/07/2022, 9:21 AM    I have personally seen and examined this patient. I agree with the assessment and plan as outlined above.  Pt stable today. Appreciate CT surgery consultation. Planning for CABG.  No chest pain.  Continue current medical therapy.   Lauree Chandler, MD, [facility] 01/07/2022 10:39 AM

## 2022-01-07 NOTE — Progress Notes (Signed)
[place] Progress Note Patient Name: Shaun Cummings DOB: December 21, 1975 MRN: [id number]   Date of Service  01/07/2022  HPI/Events of Note  Patient c/o abdominal and back pain. Tylenol not helping. Creatinine = 1.95. Will avoid NSAID's  eICU Interventions  Plan: Dilaudid 0.5 mg IV X 1.      Intervention Category Major Interventions: Other:  Anquinette Pierro Cornelia Copa 01/07/2022, 3:13 AM

## 2022-01-07 NOTE — Progress Notes (Addendum)
Progress Note    01/07/2022 9:29 AM 1 Day Post-Op  Subjective:  complaining of lower abdominal pain. Says this started yesterday evening. Was about a 7/10 now a 3/10 but intermittently very sharp and intense. Says this is different then pain he had when he came to hospital. Mostly across lower abdomen. Says it feels "like empty whole in his stomach " or like " acid is being poured into my stomach". Denies any chest pain, palpitations, indigestion or heart burn. He has not had BM since he came to hospital but feels this pain is not related. Denies any pain in his back, chest or legs.   Vitals:   01/07/22 0520 01/07/22 0838  BP: (!) 141/76 (!) 140/78  Pulse: 80 86  Resp:  20  Temp: 98.4 F (36.9 C) 98.1 F (36.7 C)  SpO2: 92% 95%   Physical Exam: Cardiac:  regular Lungs:  non labored Extremities:  R cf access site without swelling or hematoma. 2+ femoral pulses, 2+ Dp pulses bilaterally. Extremities warm and well perfused Abdomen:  soft, non distended, minimally tender in epigastric area Neurologic: alert and oriented   CBC    Component Value Date/Time   WBC 24.6 (H) 01/06/2022 0302   RBC 3.88 (L) 01/06/2022 0302   HGB 11.3 (L) 01/06/2022 0302   HCT 33.3 (L) 01/06/2022 0302   PLT 338 01/06/2022 0302   MCV 85.8 01/06/2022 0302   MCH 29.1 01/06/2022 0302   MCHC 33.9 01/06/2022 0302   RDW 12.9 01/06/2022 0302   LYMPHSABS 3.5 06/18/2020 1226   MONOABS 1.1 (H) 06/18/2020 1226   EOSABS 0.2 06/18/2020 1226   BASOSABS 0.1 06/18/2020 1226    BMET    Component Value Date/Time   NA 132 (L) 01/07/2022 0809   NA 141 04/30/2021 1114   K 3.7 01/07/2022 0809   CL 104 01/07/2022 0809   CO2 19 (L) 01/07/2022 0809   GLUCOSE 220 (H) 01/07/2022 0809   BUN 38 (H) 01/07/2022 0809   BUN 17 04/30/2021 1114   CREATININE 2.25 (H) 01/07/2022 0809   CREATININE 0.91 09/15/2013 1727   CALCIUM 8.4 (L) 01/07/2022 0809   GFRNONAA 36 (L) 01/07/2022 0809   GFRAA >60 09/08/2019 1328     INR No results found for: "INR"   Intake/Output Summary (Last 24 hours) at 01/07/2022 0929 Last data filed at 01/07/2022 0645 Gross per 24 hour  Intake 2394.39 ml  Output 300 ml  Net 2094.39 ml     Assessment/Plan:  46 y.o. male is s/p Penetrating abdominal aortic ulcer 1 Day Post-Op   Pain was improved, almost to complete resolution but as of yesterday evening he is now having recurrent abdominal pain. Although pt does describe current pain as different from pain he had on presentation Continue strict BP control Would recommend repeat CTA to re evaluate PAU/ abdominal pain. However with patients current worsening renal function would hold off on CTA unless pain does not subside Tentatively is scheduled to undergo CABG on Friday if improvement in renal function  DVT prophylaxis:  Heparin gtt   Karoline Caldwell, PA-C [facility] [phone] 01/07/2022 9:29 AM  I have independently interviewed and examined patient and agree with PA assessment and plan above.  He is having worsening abdominal pain today after looking very stable yesterday and eating.  He is planned for CABG and given appearance of his aorta on previous CT scan unlikely the cause of his abdominal pain but would be beneficial to rule out the  cause of his pain if this continues.  Given elevated creatinine we will hold for now but may require repeat CT scan if does not improve.  Mosetta Ferdinand C. Donzetta Matters, MD [facility] [place] Office: [phone] Pager: [phone]

## 2022-01-07 NOTE — TOC Initial Note (Signed)
Transition of Care [facility]) - Initial/Assessment Note    Patient Details  Name: Shaun Cummings MRN: [id number] Date of Birth: [birth year]  Transition of Care [facility]) CM/SW Contact:    Bethena Roys, RN Phone Number: 01/07/2022, 2:43 PM  Clinical Narrative:  Patient presented for chest pain. Plan for CABG 01-09-22. PTA patient was independent from home with spouse. Case Manager will continue to follow for transition of care needs as the patient progresses.              Expected Discharge Plan: Home/Self Care Barriers to Discharge: Continued Medical Work up   Patient Goals and CMS Choice Patient states their goals for this hospitalization and ongoing recovery are:: to return home.      Expected Discharge Plan and Services Expected Discharge Plan: Home/Self Care   Discharge Planning Services: CM Consult Post Acute Care Choice: NA Living arrangements for the past 2 months: [place]  Prior Living Arrangements/Services Living arrangements for the past 2 months: Single Family Home Lives with:: Spouse Patient language and need for interpreter reviewed:: Yes Do you feel safe going back to the place where you live?: Yes      Need for Family Participation in Patient Care: Yes (Comment) Care giver support system in place?: Yes (comment)   Criminal Activity/Legal Involvement Pertinent to Current Situation/Hospitalization: No - Comment as needed  Activities of Daily Living   ADL Screening (condition at time of admission) Is the patient deaf or have difficulty hearing?: No Does the patient have difficulty seeing, even when wearing glasses/contacts?: No Does the patient have difficulty concentrating, remembering, or making decisions?: No Does the patient have difficulty dressing or bathing?: No Does the patient have difficulty walking or climbing stairs?: No  Permission Sought/Granted Permission sought to share information with : Family Supports, Case Manager    Emotional Assessment Appearance:: Appears stated age Attitude/Demeanor/Rapport: Engaged Affect (typically observed): Appropriate Orientation: : Oriented to Situation, Oriented to  Time, Oriented to Place, Oriented to Self Alcohol / Substance Use: Not Applicable Psych Involvement: No (comment)  Admission diagnosis:  AKI (acute kidney injury) ([place]) [N17.9] Hypertensive emergency [I16.1] Patient Active Problem List   Diagnosis Date Noted   Non-ST elevation (NSTEMI) myocardial infarction ([place]) 01/05/2022   Hypertensive emergency 01/04/2022   AKI (acute kidney injury) ([place]) 01/04/2022   Elevated troponin 01/04/2022   Leg edema, right 12/31/2021   Diabetic retinopathy ([place]) 12/17/2021   Healthcare maintenance 12/17/2021   Angina pectoris ([place]) 12/17/2021   Marijuana use, continuous 05/21/2021   Microscopic hematuria 05/06/2021   Hematochezia 04/30/2021   Blurred vision 02/04/2021   Infected epithelial inclusion cyst 03/14/2018   Hypertension 03/09/2018   Peripheral neuropathy 05/21/2017   Hyperlipidemia associated with type 2 diabetes mellitus ([place]) 09/18/2013   Obesity (BMI 30-39.9) 09/18/2013   Diabetes ([place]) 09/15/2013   PCP:  Iona Beard, MD Pharmacy:   [place], [place] - [address] [place] [address] Phone: [phone] Fax: [phone]  Readmission Risk Interventions     No data to display

## 2022-01-07 NOTE — Consult Note (Signed)
[facility] Consult: 2:58 PM 01/07/2022  LOS: 3 days    Referring Provider: Dr Tana Coast  Primary Care Physician:  Iona Beard, MD Primary Gastroenterologist:  None.  unassigned     Reason for Consultation:  abdominal pain.     HPI: Shaun Cummings is a 46 y.o. male.  PMH DM2.  OSA, no longer on CPAP after 200 plus # wt loss.  .  Morbid obesity.  Scrotal abscess.  Nephrolithiasis.    Presented to ED for eval of pain that started in lower abdomen and migrated up into the chest.  Ruled in for acute MI. Hgb 13.5.. 10.4. MCV 84.  WBCs 24.6.Marland Kitchen 22.   Trops to 4399.   GFR 49.   Cardiac cath 12/12: severe MV dz.  Surgical revascularization recd. CABG planned.  LVEF 35 to 40%, grade 1 DD.   CT angio chest/ab/pelvis: Aortic atherosclerosis w mural thrombus in abd aorta w small penetrating ulcer in infrarenal  aorta.  Non obstructing L renal stone.  CM, coronary calcifications.  Uncomplicated colon tics.  Fat containing umbilical hernia.  L spind degenerative changes.   2 V abdomen: NO BGP.  Moderate colonic stool.   LFTs, Lipase normal.   THC + on tox screen.    During the initial course of hospitalization, the abdominal pain resolved.  After yesterday's cardiac cath, pain in the mid abdomen recurred and is very severe.  No associated nausea or vomiting.  Had not had a bowel movement since Sunday but after enema this morning, had a large amount of brown, nonbloody stool.  This has not helped the pain.  Pain is controlled with Dilaudid.  Repeat CT planned if BUN/creat allow for contrast.   No significant hypotension, no tachycardia, no fever.  + tachypnea w RA sats in 90s.    No PPI or H2 blocker at home.  No NSAIDs, aspirin products at home.  Fm Hx: DM, heart dz, Glaucoma.  No history of peptic ulcer disease,  gastrointestinal cancers, abdominal aneurysms.  Married.  2 kids.  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  Does not drink.  Quit half pack per day smoking 3 weeks ago.     Past Medical History:  Diagnosis Date   Chest pain    HTN (hypertension)    Hyperlipidemia    Lesion of penis    foreskin   OSA (obstructive sleep apnea)    per pt dx osa and used cpap but after losing wt. from 415 pounds down to 244 pounds no longer needs cpap   Peripheral neuropathy    Phimosis    Scrotal abscess 05/08/2017   Type 2 diabetes mellitus ([place])    per pt no meds for two years pt was changed to levemir unable to afford but pt states is waiting on approval for a program he applied for that will pay for his meds ([place])      Past Surgical History:  Procedure Laterality Date   CIRCUMCISION N/A 02/10/2016   Procedure: CIRCUMCISION ADULT;  Surgeon: Cleon Gustin, MD;  Location: [facility];  Service: Urology;  Laterality: N/A;   INCISION AND DRAINAGE ABSCESS Right 05/12/2017   Procedure: INCISION AND DRAINAGE RIGHT INGUINAL ABSCESS;  Surgeon: Erroll Luna, MD;  Location: [facility];  Service: General;  Laterality: Right;   INCISION AND DRAINAGE ABSCESS N/A 03/24/2018   Procedure: INCISION AND DRAINAGE POSTERIOR NECK ABSCESS;  Surgeon: Excell Seltzer, MD;  Location: [facility];  Service: General;  Laterality: N/A;   INCISION AND DRAINAGE ABSCESS Right 06/19/2020   Procedure: INCISION AND DRAINAGE ABSCESS, RIGHT GROIN;  Surgeon: Johnathan Hausen, MD;  Location: [facility];  Service: General;  Laterality: Right;   INCISION AND DRAINAGE PERIRECTAL ABSCESS Right 05/09/2017   Procedure: IRRIGATION AND DEBRIDEMENT PERINEAL ABSCESS;  Surgeon: Donnie Mesa, MD;  Location: [facility];  Service: General;  Laterality: Right;   LEFT HEART CATH AND CORONARY ANGIOGRAPHY N/A 01/06/2022   Procedure: LEFT HEART CATH AND CORONARY ANGIOGRAPHY;  Surgeon: Troy Sine, MD;  Location: [facility];  Service: Cardiovascular;  Laterality: N/A;    Prior to Admission medications   Medication Sig Start Date End Date Taking? Authorizing Provider  amlodipine-olmesartan (AZOR) 10-20 MG tablet Take 1 tablet by mouth daily. 12/30/21 03/30/22 Yes Iona Beard, MD  empagliflozin (JARDIANCE) 10 MG TABS tablet Take 1 tablet (10 mg total) by mouth daily before breakfast. 12/30/21 12/25/22 Yes Iona Beard, MD  glipiZIDE (GLUCOTROL) 5 MG tablet Take 1 tablet (5 mg total) by mouth daily before breakfast. 05/21/21 02/15/22 Yes Gaylan Gerold, DO  predniSONE (DELTASONE) 50 MG tablet Take 1 tablet (50 mg total) by mouth as directed. Please take 1 tablet 13 hours, 7 hours, and 1 hour prior to CT hematuria scan to prevent contrast reaction Patient not taking: Reported on 01/04/2022 12/22/21   Iona Beard, MD  Continuous Blood Gluc Sensor (FREESTYLE LIBRE 3 SENSOR) MISC 1 Units by Does not apply route every 14 (fourteen) days. Place 1 sensor on the skin every 14 days. Use to check glucose continuously 12/30/21   Iona Beard, MD    Scheduled Meds:  amLODipine  10 mg Oral Daily   aspirin  81 mg Oral Daily   atorvastatin  80 mg Oral Daily   carvedilol  12.5 mg Oral BID WC   Chlorhexidine Gluconate Cloth  6 each Topical Daily   docusate sodium  100 mg Oral BID   hydrALAZINE  75 mg Oral Q8H   insulin aspart  0-15 Units Subcutaneous TID WC   insulin aspart  0-5 Units Subcutaneous QHS   [START ON 01/08/2022] insulin glargine-yfgn  15 Units Subcutaneous Daily   mupirocin ointment  1 Application Nasal BID   pantoprazole (PROTONIX) IV  40 mg Intravenous Q12H   sodium chloride flush  3 mL Intravenous Q12H   sodium chloride flush  3 mL Intravenous Q12H   Infusions:  sodium chloride     sodium chloride 75 mL/hr at 01/07/22 1159   diphenhydrAMINE     heparin 1,800 Units/hr (01/07/22 0955)   PRN Meds: sodium chloride, acetaminophen, acetaminophen, diazepam, diphenhydrAMINE, naLOXone (NARCAN)   injection, ondansetron (ZOFRAN) IV, mouth rinse, polyethylene glycol, sodium chloride flush   Allergies as of 01/04/2022 - Review Complete 01/04/2022  Allergen Reaction Noted   Augmentin [amoxicillin-pot clavulanate] Hives and Itching 09/12/2013   Contrast media [iodinated contrast media] Hives 03/20/2018    Family History  Problem Relation Age of Onset   Diabetes Mother    Angina Mother    CAD Mother    Heart disease Mother  Glaucoma Father    Diabetes Father    Heart attack Father    Kidney failure Father    Diabetes Brother    Glaucoma Maternal Grandmother    Diabetes Maternal Grandmother    Heart disease Maternal Grandmother    Glaucoma Maternal Grandfather    Diabetes Maternal Grandfather    Heart disease Maternal Grandfather    Glaucoma Paternal Grandmother    Diabetes Paternal Grandmother    Glaucoma Paternal Grandfather    Diabetes Paternal Grandfather    Asthma Son     Social History   Socioeconomic History   Marital status: Married    Spouse name: Not on file   Number of children: 2   Years of education: college    Highest education level: Not on file  Occupational History   Occupation: Group Home   Tobacco Use   Smoking status: Former    Years: 2.00    Types: Cigarettes    Quit date: 02/05/2008    Years since quitting: 13.9   Smokeless tobacco: Never  Vaping Use   Vaping Use: Never used  Substance and Sexual Activity   Alcohol use: No   Drug use: No   Sexual activity: Not on file  Other Topics Concern   Not on file  Social History Narrative   Lives with wife and 2 kids, age 16 and 52.    Social Determinants of Health   Financial Resource Strain: Not on file  Food Insecurity: Not on file  Transportation Needs: Not on file  Physical Activity: Not on file  Stress: Not on file  Social Connections: Not on file  Intimate Partner Violence: Not on file    REVIEW OF SYSTEMS: Constitutional: Some fatigue. ENT:  No nose bleeds Pulm: No  shortness of breath.  Deep breathing does not exacerbate the abdominal pain. CV:  No palpitations, no LE edema.  No current angina GU:  No hematuria, no frequency GI: See HPI. Heme: No unusual bleeding or bruising. Transfusions:  None.  Neuro:  No headaches, no peripheral tingling or numbness.  No seizures, no syncope. Derm:  No itching, no rash or sores.  Endocrine:  No sweats or chills.  No polyuria or dysuria Immunization: Reviewed. Travel: Not queried.   PHYSICAL EXAM: Vital signs in last 24 hours: Vitals:   01/07/22 1142 01/07/22 1418  BP: 110/67 116/69  Pulse: 77   Resp: 18   Temp: 98.5 F (36.9 C)   SpO2: 95%    Wt Readings from Last 3 Encounters:  01/07/22 111.1 kg  12/30/21 112.3 kg  12/16/21 113.9 kg    General: Patient is obese.  He is uncomfortable.  A bit diaphoretic.  Nontoxic-appearing Head: No facial asymmetry or swelling.  No signs of head trauma. Eyes: No icterus, no pallor Ears: Not hard of hearing Nose: No congestion or discharge Mouth: Good dentition.  Mucosa is moist, pink, clear.  Tongue midline. Neck: No thyromegaly, no masses, no JVD Lungs: Clear bilaterally without labored breathing.  No cough Heart: RRR.  No MRG.  S1, S2 present. Abdomen: Obese without distention.  Soft.  Tenderness bilaterally mostly in the upper quadrants and some into the middle, waist level.  No guarding or rebound.  No tenderness in the lower quadrants or pelvic region..   Rectal: Deferred. Musc/Skeltl: No joint redness, swelling or gross deformity. Extremities: No CCE. Neurologic: Oriented x 3.  Moves all 4 limbs.  No tremors or gross deficits. Skin: No telangiectasia, no rash, no sores. Nodes:  No cervical adenopathy Psych: Affect depressed but cooperative.  Speech fluid.  Intake/Output from previous day: 12/12 0701 - 12/13 0700 In: 2394.4 [P.O.:580; I.V.:1814.4] Out: 300 [Urine:300] Intake/Output this shift: No intake/output data recorded.  LAB RESULTS: Recent  Labs    01/05/22 0321 01/06/22 0302 01/07/22 1008  WBC 17.6* 24.6* 22.0*  HGB 12.6* 11.3* 10.4*  HCT 35.8* 33.3* 29.2*  PLT 334 338 295   BMET Lab Results  Component Value Date   NA 132 (L) 01/07/2022   NA 134 (L) 01/06/2022   NA 135 01/05/2022   K 3.7 01/07/2022   K 4.4 01/06/2022   K 3.4 (L) 01/05/2022   CL 104 01/07/2022   CL 105 01/06/2022   CL 100 01/05/2022   CO2 19 (L) 01/07/2022   CO2 19 (L) 01/06/2022   CO2 23 01/05/2022   GLUCOSE 220 (H) 01/07/2022   GLUCOSE 236 (H) 01/06/2022   GLUCOSE 277 (H) 01/05/2022   BUN 38 (H) 01/07/2022   BUN 32 (H) 01/06/2022   BUN 23 (H) 01/05/2022   CREATININE 2.25 (H) 01/07/2022   CREATININE 1.95 (H) 01/06/2022   CREATININE 1.74 (H) 01/05/2022   CALCIUM 8.4 (L) 01/07/2022   CALCIUM 8.4 (L) 01/06/2022   CALCIUM 8.9 01/05/2022   LFT Recent Labs    01/04/22 2037  PROT 7.4  ALBUMIN 3.1*  AST 17  ALT 14  ALKPHOS 81  BILITOT 0.3   PT/INR No results found for: "INR", "PROTIME" Hepatitis Panel No results for input(s): "HEPBSAG", "HCVAB", "HEPAIGM", "HEPBIGM" in the last 72 hours. C-Diff No components found for: "CDIFF" Lipase     Component Value Date/Time   LIPASE 24 01/07/2022 0809    Drugs of Abuse     Component Value Date/Time   LABOPIA NONE DETECTED 01/04/2022 2343   COCAINSCRNUR NONE DETECTED 01/04/2022 2343   LABBENZ NONE DETECTED 01/04/2022 2343   AMPHETMU NONE DETECTED 01/04/2022 2343   THCU POSITIVE (A) 01/04/2022 2343   LABBARB NONE DETECTED 01/04/2022 2343     RADIOLOGY STUDIES: DG Abd 2 Views  Result Date: 01/07/2022 CLINICAL DATA:  Lower abdominal pain EXAM: ABDOMEN - 2 VIEW COMPARISON:  01/04/2022 FINDINGS: The bowel gas pattern is normal. There is no evidence of free air. Moderate volume of stool within the colon. Excreted contrast within the urinary bladder. No radio-opaque calculi or other significant radiographic abnormality is seen. IMPRESSION: Nonobstructive bowel gas pattern. Moderate  volume of stool within the colon. Electronically Signed   By: Davina Poke D.O.   On: 01/07/2022 10:59   CARDIAC CATHETERIZATION  Result Date: 01/06/2022   Dist LAD lesion is 50% stenosed.   Prox LAD lesion is 70% stenosed.   Ost RCA to Prox RCA lesion is 75% stenosed.   Prox RCA lesion is 75% stenosed.   Prox RCA to Mid RCA lesion is 95% stenosed.   Mid RCA lesion is 50% stenosed.   1st Mrg lesion is 50% stenosed.   Mid Cx-2 lesion is 70% stenosed.   2nd Mrg lesion is 95% stenosed.   Mid Cx-1 lesion is 50% stenosed. Severe multivessel CAD with the dominant RCA being the "culprit" vessel contributing to the non-STEMI with 70% diffuse proximal stenosis followed by 70% and 95% thrombotic mid stenosis and 50% distal stenosis. There is 70% diffuse proximal LAD stenosis with diffuse 50% distal LAD stenosis. The left circumflex vessel has diffuse 50% stenosis in the small OM 2 vessel with 95% focal stenosis in the OM 3 vessel and bifurcation AV groove  50% stenosis followed by 70% distal stenosis. LVEDP elevated at 27 mm. RECOMMENDATION: Surgical consultation will be recommended for CABG revascularization.  Will resume heparin 8 hours post sheath discontinuance.  Initiate medical therapy for significant CAD and aggressive lipid-lowering therapy with target LDL less than 55.  Smoking cessation is essential.     IMPRESSION:     NSTEMI.  Severe multivessel CAD.  Reduced LVEF 35 to 40%.  Plan for CABG.    Acute abdominal pain.  Recent CT did not show gastrointestinal pathology.  Study reveals aortic atherosclerosis and mural thrombus within abdominal aorta and small, penetrating ulcer in the infrarenal aorta.  LFTs, lipase normal at arrival.  CT showed moderate colonic stool burden but no improvement of pain after SMOG enema and large non-bloody stool.  Elevated WBCs w/O fever.      Hyponatremia.      AKI.      Moore anemia.      Microhematuria.  Non-obstructing kidney stone.  No evidence UTI.       NIDDM.     PLAN:       Agree w CTA if renal function allows.     Azucena Freed  01/07/2022, 2:58 PM Phone [phone]

## 2022-01-07 NOTE — Progress Notes (Addendum)
[facility]                                                                              Logon Uttech, is a 46 y.o. male, DOB - 12/14/1975, NKN:[id number] Admit date - 01/04/2022    Outpatient Primary MD for the patient is Iona Beard, MD  LOS - 3  days  Chief Complaint  Patient presents with   Chest Pain       Brief summary   Patient is a 46 year old male with HTN, HLD, OSA, diabetes mellitus type 2 presented with chest pain.  In ED, BP noted to be 206/111, troponin 19 CTA chest abdomen pelvis showed small penetrating ulcer in the infrarenal abdominal aorta measuring 3 mm, no mural hematoma.  No evidence of aortic aneurysm or dissection.  Creatinine 1.9 Vascular surgery was consulted and recommended medical management.  Patient was placed on esmolol drip and was admitted to ICU by PCCM.  Transferred to the floor and TRH assumed care on 01/07/2022  Significant Hospital Events:  12/10 presented to [facility] CTA chest abd pelvis-  CTA chest abdomen pelvis with mall 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, VVS consult, PCCM consult 12/11 Denies pain today. Foot numbness better than a month ago, not worse. Echo with LVEF 35-40% with hypokinesis of the LV septal, anterior, & lateral walls. Severe hypokinesis oif the LV basal mid inferior wall. G1DD. LA moderately dilated, RA mildly dilated. Normal RV function.  12/12 LHC > severe multivessel disease, surgical consultation recommended    Assessment & Plan    Principal Problem:   Hypertensive emergency -Esmolol drip off, patient now placed on Coreg, amlodipine, hydralazine -Continue labetalol as needed with SBP goal less than 120, heart rate less than 60   Active Problems: Severe multivessel coronary disease, NSTEMI Acute HFrEF -Cardiology was consulted, underwent 2D echo which showed EF of 35 to 40% with hypokinesis of LV septal, anterior and lateral walls.  Severe hypokinesis of the  LV basal mid inferior wall, G1 DD. -Underwent cardiac cath which showed severe multivessel disease, TCTS consulted  Penetrating abdominal aortic ulcer -Initially presented with abdominal/back pain, found to have mural thrombus of abdominal aorta with small penetrating ulcer and infrarenal abdominal aorta measuring 3 mm with no medial hematoma -Evaluated by vascular surgery, recommended pain management, BP control and repeat scan in 4 to 6 weeks.  However given significant abdominal pain today, will repeat CTA if creatinine improving.   Acute abdominal pain, history of GERD -Patient also reports no BM since Sunday, and has history of GERD -Placed on IV PPI BID -Lipase 24, recent CT abdomen showed no abdominal pathology except abdominal aortic ulcer -Abdominal x-ray showed nonobstructive bowel gas pattern with moderate volume of stool within the colon -Continue bowel regimen, smog enema ordered-> had a good BM, no significant improvement in abdominal pain -Continues to have acute abdominal pain, GI consulted. -Vascular surgery note reviewed, unable to do repeat CTA due to renal dysfunction, creatinine 2.2, started on IV fluids this morning.  Will repeat bmet, if creatinine less than 2, will obtain stat CTA abdomen.     AKI (acute  kidney injury) ([place]) - Creatinine has been trending up, 1.9 on 12/12, 2.2 today, likely due to contrast nephropathy -Started on gentle IV fluid hydration Addendum: 4:45 PM  Cr now 2.4, with NAGMA, placing on bicarb drip - UA/Ucx, renal US, will consult with renal if no improvement  Diabetes mellitus type 2, uncontrolled with hyperglycemia Hemoglobin A1c 11.5 -Continue Semglee 15 units daily, moderate sliding scale insulin Recent Labs    01/06/22 1015 01/06/22 1234 01/06/22 1708 01/06/22 2129 01/07/22 0844 01/07/22 1147  GLUCAP 252* 191* 184* 227* 232* 191*      OSA -Continue CPAP nightly   GERD -Start IV PPI  Obesity  Estimated body mass index  is 34.16 kg/m as calculated from the following:   Height as of this encounter: 5\' 11"  (1.803 m).   Weight as of this encounter: 111.1 kg.  Code Status: Full code DVT Prophylaxis:  SCD's Start: 01/06/22 1235 SCDs Start: 01/04/22 2332   Level of Care: Level of care: Progressive Family Communication: Updated patient Disposition Plan:      Remains inpatient appropriate:     Procedures:  Cardiac cath Consultants:   Vascular surgery Was admitted by CCM Cardiac thoracic surgery GI  Antimicrobials:     Medications  amLODipine  10 mg Oral Daily   aspirin  81 mg Oral Daily   atorvastatin  80 mg Oral Daily   carvedilol  12.5 mg Oral BID WC   Chlorhexidine Gluconate Cloth  6 each Topical Daily   docusate sodium  100 mg Oral BID   hydrALAZINE  75 mg Oral Q8H   insulin aspart  0-15 Units Subcutaneous TID WC   insulin aspart  0-5 Units Subcutaneous QHS   [START ON 01/08/2022] insulin glargine-yfgn  15 Units Subcutaneous Daily   mupirocin ointment  1 Application Nasal BID   pantoprazole (PROTONIX) IV  40 mg Intravenous Q12H   sodium chloride flush  3 mL Intravenous Q12H   sodium chloride flush  3 mL Intravenous Q12H      Subjective:   Shaun Cummings was seen and examined today.  Complaining of acute abdominal pain periumbilical/epigastric. tearful, no acute nausea or vomiting.  Reports no BM since Sunday.  No chest pain or palpitations.    Objective:   Vitals:   01/07/22 0520 01/07/22 0838 01/07/22 1142 01/07/22 1418  BP: (!) 141/76 (!) 140/78 110/67 116/69  Pulse: 80 86 77   Resp:  20 18   Temp: 98.4 F (36.9 C) 98.1 F (36.7 C) 98.5 F (36.9 C)   TempSrc:  Oral Oral   SpO2: 92% 95% 95%   Weight:      Height:        Intake/Output Summary (Last 24 hours) at 01/07/2022 1440 Last data filed at 01/07/2022 0645 Gross per 24 hour  Intake 2394.39 ml  Output 300 ml  Net 2094.39 ml     Wt Readings from Last 3 Encounters:  01/07/22 111.1 kg  12/30/21 112.3 kg   12/16/21 113.9 kg     Exam General: Alert and oriented x 3, NAD Cardiovascular: S1 S2 auscultated,  RRR Respiratory: Clear to auscultation bilaterally, no wheezing Gastrointestinal: Soft, tender in the epigastric and periumbilical area, NBS Ext: no pedal edema bilaterally Neuro: Strength 5/5 upper and lower extremities bilaterally Psych: anxious    Data Reviewed:  I have personally reviewed following labs    CBC Lab Results  Component Value Date   WBC 22.0 (H) 01/07/2022   RBC 3.49 (L) 01/07/2022   HGB  10.4 (L) 01/07/2022   HCT 29.2 (L) 01/07/2022   MCV 83.7 01/07/2022   MCH 29.8 01/07/2022   PLT 295 01/07/2022   MCHC 35.6 01/07/2022   RDW 13.2 01/07/2022   LYMPHSABS 1.1 01/07/2022   MONOABS 1.8 (H) 01/07/2022   EOSABS 0.0 01/07/2022   BASOSABS 0.0 28/36/6294     Last metabolic panel Lab Results  Component Value Date   NA 132 (L) 01/07/2022   K 3.7 01/07/2022   CL 104 01/07/2022   CO2 19 (L) 01/07/2022   BUN 38 (H) 01/07/2022   CREATININE 2.25 (H) 01/07/2022   GLUCOSE 220 (H) 01/07/2022   GFRNONAA 36 (L) 01/07/2022   GFRAA >60 09/08/2019   CALCIUM 8.4 (L) 01/07/2022   PROT 7.4 01/04/2022   ALBUMIN 3.1 (L) 01/04/2022   BILITOT 0.3 01/04/2022   ALKPHOS 81 01/04/2022   AST 17 01/04/2022   ALT 14 01/04/2022   ANIONGAP 9 01/07/2022    CBG (last 3)  Recent Labs    01/06/22 2129 01/07/22 0844 01/07/22 1147  GLUCAP 227* 232* 191*      Coagulation Profile: No results for input(s): "INR", "PROTIME" in the last 168 hours.   Radiology Studies: I have personally reviewed the imaging studies  DG Abd 2 Views  Result Date: 01/07/2022 CLINICAL DATA:  Lower abdominal pain EXAM: ABDOMEN - 2 VIEW COMPARISON:  01/04/2022 FINDINGS: The bowel gas pattern is normal. There is no evidence of free air. Moderate volume of stool within the colon. Excreted contrast within the urinary bladder. No radio-opaque calculi or other significant radiographic abnormality is  seen. IMPRESSION: Nonobstructive bowel gas pattern. Moderate volume of stool within the colon. Electronically Signed   By: Davina Poke D.O.   On: 01/07/2022 10:59   CARDIAC CATHETERIZATION  Result Date: 01/06/2022   Dist LAD lesion is 50% stenosed.   Prox LAD lesion is 70% stenosed.   Ost RCA to Prox RCA lesion is 75% stenosed.   Prox RCA lesion is 75% stenosed.   Prox RCA to Mid RCA lesion is 95% stenosed.   Mid RCA lesion is 50% stenosed.   1st Mrg lesion is 50% stenosed.   Mid Cx-2 lesion is 70% stenosed.   2nd Mrg lesion is 95% stenosed.   Mid Cx-1 lesion is 50% stenosed. Severe multivessel CAD with the dominant RCA being the "culprit" vessel contributing to the non-STEMI with 70% diffuse proximal stenosis followed by 70% and 95% thrombotic mid stenosis and 50% distal stenosis. There is 70% diffuse proximal LAD stenosis with diffuse 50% distal LAD stenosis. The left circumflex vessel has diffuse 50% stenosis in the small OM 2 vessel with 95% focal stenosis in the OM 3 vessel and bifurcation AV groove 50% stenosis followed by 70% distal stenosis. LVEDP elevated at 27 mm. RECOMMENDATION: Surgical consultation will be recommended for CABG revascularization.  Will resume heparin 8 hours post sheath discontinuance.  Initiate medical therapy for significant CAD and aggressive lipid-lowering therapy with target LDL less than 55.  Smoking cessation is essential.      Estill Cotta M.D. [facility] 01/07/2022, 2:40 PM  Available via Epic secure chat 7am-7pm After 7 pm, please refer to night coverage provider listed on amion.

## 2022-01-08 ENCOUNTER — Inpatient Hospital Stay (HOSPITAL_COMMUNITY): Payer: 59

## 2022-01-08 DIAGNOSIS — I161 Hypertensive emergency: Secondary | ICD-10-CM | POA: Diagnosis not present

## 2022-01-08 DIAGNOSIS — Z01810 Encounter for preprocedural cardiovascular examination: Secondary | ICD-10-CM | POA: Diagnosis not present

## 2022-01-08 DIAGNOSIS — I2510 Atherosclerotic heart disease of native coronary artery without angina pectoris: Secondary | ICD-10-CM | POA: Diagnosis not present

## 2022-01-08 DIAGNOSIS — I7143 Infrarenal abdominal aortic aneurysm, without rupture: Secondary | ICD-10-CM | POA: Diagnosis not present

## 2022-01-08 DIAGNOSIS — I10 Essential (primary) hypertension: Secondary | ICD-10-CM | POA: Diagnosis not present

## 2022-01-08 DIAGNOSIS — I214 Non-ST elevation (NSTEMI) myocardial infarction: Secondary | ICD-10-CM | POA: Diagnosis not present

## 2022-01-08 DIAGNOSIS — N1832 Chronic kidney disease, stage 3b: Secondary | ICD-10-CM | POA: Diagnosis not present

## 2022-01-08 DIAGNOSIS — I719 Aortic aneurysm of unspecified site, without rupture: Secondary | ICD-10-CM | POA: Diagnosis not present

## 2022-01-08 DIAGNOSIS — R1033 Periumbilical pain: Secondary | ICD-10-CM | POA: Diagnosis not present

## 2022-01-08 LAB — URINALYSIS, ROUTINE W REFLEX MICROSCOPIC
Bilirubin Urine: NEGATIVE
Glucose, UA: >=500 mg/dL — AB
Ketones, ur: NEGATIVE mg/dL
Leukocytes,Ua: NEGATIVE
Nitrite: NEGATIVE
Protein, ur: >=300 mg/dL — AB
Specific Gravity, Urine: 1.018 (ref 1.005–1.030)
pH: 5 (ref 5.0–8.0)

## 2022-01-08 LAB — GLUCOSE, CAPILLARY
Glucose-Capillary: 180 mg/dL — ABNORMAL HIGH (ref 70–99)
Glucose-Capillary: 174 mg/dL — ABNORMAL HIGH (ref 70–99)
Glucose-Capillary: 159 mg/dL — ABNORMAL HIGH (ref 70–99)
Glucose-Capillary: 228 mg/dL — ABNORMAL HIGH (ref 70–99)
Glucose-Capillary: 189 mg/dL — ABNORMAL HIGH (ref 70–99)

## 2022-01-08 LAB — HEPARIN LEVEL (UNFRACTIONATED)
Heparin Unfractionated: 0.42 IU/mL (ref 0.30–0.70)
Heparin Unfractionated: 0.42 IU/mL (ref 0.30–0.70)

## 2022-01-08 LAB — CBC
HCT: 28 % — ABNORMAL LOW (ref 39.0–52.0)
Hemoglobin: 9.6 g/dL — ABNORMAL LOW (ref 13.0–17.0)
MCH: 29.3 pg (ref 26.0–34.0)
MCHC: 34.3 g/dL (ref 30.0–36.0)
MCV: 85.4 fL (ref 80.0–100.0)
Platelets: 255 10*3/uL (ref 150–400)
RBC: 3.28 MIL/uL — ABNORMAL LOW (ref 4.22–5.81)
RDW: 13.3 % (ref 11.5–15.5)
WBC: 23.4 10*3/uL — ABNORMAL HIGH (ref 4.0–10.5)
nRBC: 0 % (ref 0.0–0.2)

## 2022-01-08 LAB — PULMONARY FUNCTION TEST
FEF 25-75 Pre: 1.95 L/sec
FEF2575-%Pred-Pre: 50 %
FEV1-%Pred-Pre: 53 %
FEV1-Pre: 2.28 L
FEV1FVC-%Pred-Pre: 97 %
FEV6-%Pred-Pre: 56 %
FEV6-Pre: 2.97 L
FEV6FVC-%Pred-Pre: 102 %
FVC-%Pred-Pre: 55 %
FVC-Pre: 2.97 L
Pre FEV1/FVC ratio: 77 %
Pre FEV6/FVC Ratio: 100 %

## 2022-01-08 LAB — BASIC METABOLIC PANEL
Anion gap: 11 (ref 5–15)
BUN: 41 mg/dL — ABNORMAL HIGH (ref 6–20)
CO2: 18 mmol/L — ABNORMAL LOW (ref 22–32)
Calcium: 8 mg/dL — ABNORMAL LOW (ref 8.9–10.3)
Chloride: 102 mmol/L (ref 98–111)
Creatinine, Ser: 2.46 mg/dL — ABNORMAL HIGH (ref 0.61–1.24)
GFR, Estimated: 32 mL/min — ABNORMAL LOW (ref >60)
Glucose, Bld: 170 mg/dL — ABNORMAL HIGH (ref 70–99)
Potassium: 3.5 mmol/L (ref 3.5–5.1)
Sodium: 131 mmol/L — ABNORMAL LOW (ref 135–145)

## 2022-01-08 LAB — PROTEIN / CREATININE RATIO, URINE
Creatinine, Urine: 131 mg/dL
Protein Creatinine Ratio: 3.56 mg/mg{Cre} — ABNORMAL HIGH (ref 0.00–0.15)
Total Protein, Urine: 466 mg/dL

## 2022-01-08 LAB — HEMOGLOBIN A1C
Hgb A1c MFr Bld: 9.9 % — ABNORMAL HIGH (ref 4.8–5.6)
Mean Plasma Glucose: 237 mg/dL

## 2022-01-08 MED ORDER — STERILE WATER FOR INJECTION IV SOLN
INTRAVENOUS | Status: DC
  Filled 2022-01-08 (×2): qty 1000

## 2022-01-08 MED ORDER — SENNOSIDES-DOCUSATE SODIUM 8.6-50 MG PO TABS: 1.0000 | ORAL_TABLET | Freq: Two times a day (BID) | ORAL | Status: DC

## 2022-01-08 MED ORDER — MORPHINE SULFATE (PF) 2 MG/ML IV SOLN
1.0000 mg | Freq: Once | INTRAVENOUS | Status: AC
  Administered 2022-01-08: 1 mg via INTRAVENOUS
  Filled 2022-01-08: qty 1

## 2022-01-08 MED ORDER — NITROGLYCERIN 0.4 MG SL SUBL
SUBLINGUAL_TABLET | SUBLINGUAL | Status: AC
  Administered 2022-01-08: 0.4 mg via SUBLINGUAL
  Filled 2022-01-08: qty 1

## 2022-01-08 MED ORDER — NITROGLYCERIN 0.4 MG SL SUBL
0.4000 mg | SUBLINGUAL_TABLET | SUBLINGUAL | Status: AC | PRN
  Administered 2022-01-09 – 2022-01-11 (×2): 0.4 mg via SUBLINGUAL
  Filled 2022-01-08 (×3): qty 1

## 2022-01-08 MED ORDER — DOCUSATE SODIUM 100 MG PO CAPS
100.0000 mg | ORAL_CAPSULE | Freq: Two times a day (BID) | ORAL | Status: DC
  Administered 2022-01-08 – 2022-01-16 (×13): 100 mg via ORAL
  Filled 2022-01-08 (×15): qty 1

## 2022-01-08 MED ORDER — INSULIN ASPART 100 UNIT/ML IJ SOLN
3.0000 [IU] | Freq: Three times a day (TID) | INTRAMUSCULAR | Status: DC
  Administered 2022-01-09: 3 [IU] via SUBCUTANEOUS

## 2022-01-08 MED ORDER — SODIUM CHLORIDE 0.9 % IV SOLN: INTRAVENOUS | Status: DC

## 2022-01-08 NOTE — Progress Notes (Signed)
[facility]                                                                              Shaun Cummings, is a 46 y.o. male, DOB - Feb 09, 1975, KDX:[id number] Admit date - 01/04/2022    Outpatient Primary MD for the patient is Shaun Beard, MD  LOS - 4  days  Chief Complaint  Patient presents with   Chest Pain       Brief summary   Patient is a 46 year old male with HTN, HLD, OSA, diabetes mellitus type 2 presented with chest pain.  In ED, BP noted to be 206/111, troponin 19 CTA chest abdomen pelvis showed small penetrating ulcer in the infrarenal abdominal aorta measuring 3 mm, no mural hematoma.  No evidence of aortic aneurysm or dissection.  Creatinine 1.9 Vascular surgery was consulted and recommended medical management.  Patient was placed on esmolol drip and was admitted to ICU by PCCM.  Transferred to the floor and TRH assumed care on 01/07/2022  Significant Hospital Events:  12/10 presented to [facility]. CTA chest abd pelvis-  CTA chest abdomen pelvis with mall 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, VVS consult, PCCM consult 12/11 Denies pain today. Foot numbness better than a month ago, not worse. Echo with LVEF 35-40% with hypokinesis of the LV septal, anterior, & lateral walls. Severe hypokinesis oif the LV basal mid inferior wall. G1DD. LA moderately dilated, RA mildly dilated. Normal RV function.  12/12 LHC > severe multivessel disease, surgical consultation recommended    Assessment & Plan    Principal Problem:   Hypertensive emergency -Off esmolol drip, on labetalol PRN with parameters -BP now stable, continue Coreg, amlodipine, hydralazine.  Active Problems: Severe multivessel coronary disease, NSTEMI Acute HFrEF -Cardiology was consulted, underwent 2D echo which showed EF of 35 to 40% with hypokinesis of LV septal, anterior and lateral walls.  Severe hypokinesis of the LV basal mid inferior wall, G1 DD. -Underwent  cardiac cath which showed severe multivessel disease, TCTS consulted -I's and O's with 7.8 L positive, euvolemic, no LE edema, lungs clear  Penetrating abdominal aortic ulcer -Initially presented with abdominal/back pain, found to have mural thrombus of abdominal aorta with small penetrating ulcer and infrarenal abdominal aorta measuring 3 mm with no medial hematoma -Evaluated by vascular surgery, recommended pain management, BP control and repeat scan in 4 to 6 weeks.   -Vascular surgery following, repeat CTA when feasible with renal function  Acute abdominal pain, history of GERD -Patient also reports no BM since Sunday, and has history of GERD -Placed on IV PPI BID -Lipase 24, recent CT abdomen showed no abdominal pathology except abdominal aortic ulcer -Abdominal x-ray on 12/13 showed nonobstructive bowel gas pattern with moderate volume of stool within the colon, placed on bowel regimen -GI was consulted. -Creatinine trended up to 2.2, unable to obtain repeat CTA. -Feeling better today, not complaining of any abdominal pain, continue to monitor     AKI (acute kidney injury) ([place]) - Creatinine has been trending up, 1.9 on 12/12,  -Trended up to 2.4 today, renal ultrasound with no obstructive uropathy  -UA negative for  UTI -Continue IV fluids -Nephrology following   Diabetes mellitus type 2, uncontrolled with hyperglycemia Hemoglobin A1c 11.5 -Continue Semglee 15 units daily, moderate SSI, changed to carb modified diet, added NovoLog 3 units 3 times daily AC  Recent Labs    01/07/22 0844 01/07/22 1147 01/07/22 1628 01/07/22 2213 01/08/22 0802 01/08/22 1252  GLUCAP 232* 191* 182* 149* 180* 174*   OSA -Continue CPAP nightly   GERD -Continue PPI  Obesity  Estimated body mass index is 34.59 kg/m as calculated from the following:   Height as of this encounter: 5\' 11"  (1.803 m).   Weight as of this encounter: 112.5 kg.  Code Status: Full code DVT Prophylaxis:  SCD's  Start: 01/06/22 1235 SCDs Start: 01/04/22 2332   Level of Care: Level of care: Progressive Family Communication: Updated patient Disposition Plan:      Remains inpatient appropriate:     Procedures:  Cardiac cath Consultants:   Vascular surgery Was admitted by CCM Cardiac thoracic surgery GI  Antimicrobials:     Medications  amLODipine  10 mg Oral Daily   aspirin  81 mg Oral Daily   atorvastatin  80 mg Oral Daily   carvedilol  12.5 mg Oral BID WC   Chlorhexidine Gluconate Cloth  6 each Topical Daily   docusate sodium  100 mg Oral BID   hydrALAZINE  75 mg Oral Q8H   insulin aspart  0-15 Units Subcutaneous TID WC   insulin aspart  0-5 Units Subcutaneous QHS   insulin glargine-yfgn  15 Units Subcutaneous Daily   mupirocin ointment  1 Application Nasal BID   pantoprazole (PROTONIX) IV  40 mg Intravenous Q12H   sodium chloride flush  3 mL Intravenous Q12H   sodium chloride flush  3 mL Intravenous Q12H      Subjective:   Shaun Cummings was seen and examined today.  Feeling a lot better today, states abdominal pain is much improved today compared to yesterday.  No fevers.  No nausea or vomiting.    Objective:   Vitals:   01/07/22 1500 01/07/22 2155 01/08/22 0630 01/08/22 0801  BP: 121/67 129/74 135/80 121/74  Pulse: 77 77 80 80  Resp: 20 18 16 16   Temp: 99 F (37.2 C) 98.5 F (36.9 C) 98.8 F (37.1 C) 98.6 F (37 C)  TempSrc: Oral Oral Oral Oral  SpO2: 95%  93% 92%  Weight:   112.5 kg   Height:        Intake/Output Summary (Last 24 hours) at 01/08/2022 1349 Last data filed at 01/08/2022 1037 Gross per 24 hour  Intake 2075.48 ml  Output --  Net 2075.48 ml     Wt Readings from Last 3 Encounters:  01/08/22 112.5 kg  12/30/21 112.3 kg  12/16/21 113.9 kg    Physical Exam General: Alert and oriented x 3, NAD Cardiovascular: S1 S2 clear, RRR.  Respiratory: CTAB, no wheezing, rales  Gastrointestinal: Soft, nontender, nondistended, NBS Ext: no pedal  edema bilaterally Neuro: no new deficits Psych: Normal affect     Data Reviewed:  I have personally reviewed following labs    CBC Lab Results  Component Value Date   WBC 23.4 (H) 01/08/2022   RBC 3.28 (L) 01/08/2022   HGB 9.6 (L) 01/08/2022   HCT 28.0 (L) 01/08/2022   MCV 85.4 01/08/2022   MCH 29.3 01/08/2022   PLT 255 01/08/2022   MCHC 34.3 01/08/2022   RDW 13.3 01/08/2022   LYMPHSABS 1.1 01/07/2022   MONOABS 1.8 (H) 01/07/2022  EOSABS 0.0 01/07/2022   BASOSABS 0.0 85/02/7739     Last metabolic panel Lab Results  Component Value Date   NA 131 (L) 01/08/2022   K 3.5 01/08/2022   CL 102 01/08/2022   CO2 18 (L) 01/08/2022   BUN 41 (H) 01/08/2022   CREATININE 2.46 (H) 01/08/2022   GLUCOSE 170 (H) 01/08/2022   GFRNONAA 32 (L) 01/08/2022   GFRAA >60 09/08/2019   CALCIUM 8.0 (L) 01/08/2022   PROT 7.4 01/04/2022   ALBUMIN 3.1 (L) 01/04/2022   BILITOT 0.3 01/04/2022   ALKPHOS 81 01/04/2022   AST 17 01/04/2022   ALT 14 01/04/2022   ANIONGAP 11 01/08/2022    CBG (last 3)  Recent Labs    01/07/22 2213 01/08/22 0802 01/08/22 1252  GLUCAP 149* 180* 174*      Coagulation Profile: No results for input(s): "INR", "PROTIME" in the last 168 hours.   Radiology Studies: I have personally reviewed the imaging studies  US RENAL  Result Date: 01/07/2022 CLINICAL DATA:  Acute kidney injury with acute tubular necrosis. EXAM: RENAL / URINARY TRACT ULTRASOUND COMPLETE COMPARISON:  Abdominal CTA 01/04/2022 FINDINGS: Right Kidney: Renal measurements: 12.1 x 6.3 x 6.1 cm = volume: 243 mL. Mildly increased parenchymal echogenicity. No hydronephrosis. No visualized stone or focal lesion. Left Kidney: Renal measurements: 12.5 x 7.9 x 5.1 cm = volume: 263 mL. Mildly increased parenchymal echogenicity. No hydronephrosis. Renal calculi on recent CT are not seen by ultrasound. No focal lesion. Bladder: Appears normal for degree of bladder distention. Other: None. IMPRESSION: 1.  Mildly increased renal parenchymal echogenicity suggestive of chronic medical renal disease. 2. No obstructive uropathy. 3. Left renal stones on recent CT are not seen by ultrasound. Electronically Signed   By: Keith Rake M.D.   On: 01/07/2022 18:35   DG Abd 2 Views  Result Date: 01/07/2022 CLINICAL DATA:  Lower abdominal pain EXAM: ABDOMEN - 2 VIEW COMPARISON:  01/04/2022 FINDINGS: The bowel gas pattern is normal. There is no evidence of free air. Moderate volume of stool within the colon. Excreted contrast within the urinary bladder. No radio-opaque calculi or other significant radiographic abnormality is seen. IMPRESSION: Nonobstructive bowel gas pattern. Moderate volume of stool within the colon. Electronically Signed   By: Davina Poke D.O.   On: 01/07/2022 10:59       Charlina Dwight M.D. [facility] 01/08/2022, 1:49 PM  Available via Epic secure chat 7am-7pm After 7 pm, please refer to night coverage provider listed on amion.

## 2022-01-08 NOTE — Plan of Care (Signed)
  Problem: Education: Goal: Ability to describe self-care measures that may prevent or decrease complications (Diabetes Survival Skills Education) will improve Outcome: Progressing   

## 2022-01-08 NOTE — Progress Notes (Signed)
[place] for heparin  Indication: chest pain/ACS  Allergies  Allergen Reactions   Augmentin [Amoxicillin-Pot Clavulanate] Hives and Itching    Has patient had a PCN reaction causing immediate rash, facial/tongue/throat swelling, SOB or lightheadedness with hypotension: Yes Has patient had a PCN reaction causing severe rash involving mucus membranes or skin necrosis: No Has patient had a PCN reaction that required hospitalization: No Has patient had a PCN reaction occurring within the last 10 years: No If all of the above answers are "NO", then may proceed with Cephalosporin use.   Contrast Media [Iodinated Contrast Media] Hives    Pt broke out with 1 hive on forehead after CT injection-treated with 25mg  Benadryl    Patient Measurements: Height: 5\' 11"  (180.3 cm) Weight: 112.5 kg (248 lb) IBW/kg (Calculated) : 75.3 Heparin Dosing Weight: 99.5 kg  Vital Signs: Temp: 98.6 F (37 C) (12/14 0801) Temp Source: Oral (12/14 0801) BP: 121/74 (12/14 0801) Pulse Rate: 80 (12/14 0801)  Labs: Recent Labs    01/06/22 0302 01/06/22 0440 01/07/22 0809 01/07/22 1008 01/07/22 1455 01/07/22 1509 01/08/22 0207 01/08/22 1218  HGB 11.3*  --   --  10.4*  --   --  9.6*  --   HCT 33.3*  --   --  29.2*  --   --  28.0*  --   PLT 338  --   --  295  --   --  255  --   HEPARINUNFRC 0.17*  --  0.17*  --  0.21*  --  0.42 0.42  CREATININE  --    < > 2.25*  --   --  2.42* 2.46*  --    < > = values in this interval not displayed.     Estimated Creatinine Clearance: 48.9 mL/min (A) (by C-G formula based on SCr of 2.46 mg/dL (H)).   Medical History: Past Medical History:  Diagnosis Date   Chest pain    HTN (hypertension)    Hyperlipidemia    Lesion of penis    foreskin   OSA (obstructive sleep apnea)    per pt dx osa and used cpap but after losing wt. from 415 pounds down to 244 pounds no longer needs cpap   Peripheral neuropathy    Phimosis     Scrotal abscess 05/08/2017   Type 2 diabetes mellitus ([place])    per pt no meds for two years pt was changed to levemir unable to afford but pt states is waiting on approval for a program he applied for that will pay for his meds ([place])      Assessment: 46 yo M with c/f NSTEMI with elevated troponins and EKG changes. No AC PTA.  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 Heparin level came back therapeutic at 0.42, on 2100 units/hr. No s/sx of bleeding or infusion issues.  Goal of Therapy:  Heparin level 0.3-0.7 units/ml Monitor platelets by anticoagulation protocol: Yes   Plan:  Continue heparin 2100 units/hr Monitor daily HL, CBC, and for s/sx of bleeding  Antonietta Jewel, PharmD, [facility]  Phone: [phone] 01/08/2022 1:24 PM  Please check AMION for all [place] phone numbers After 10:00 PM, call [place] [phone]

## 2022-01-08 NOTE — Progress Notes (Signed)
patient has only voided 1x today and wife emptied urinal before we seen it. bladder scan done  and only showed 119ml in bladder. we tried 3 times and that was the highest amount showed on bladder scan.  Patient not showing any s/s of distress, Nephrology MD notified. No new orders at this time   Jacquelina Hewins, [employer]

## 2022-01-08 NOTE — Progress Notes (Signed)
Pre-CABG testing has been completed. Preliminary results can be found in CV Proc through chart review.   01/08/22 3:17 PM Shaun Cummings RVT

## 2022-01-08 NOTE — Progress Notes (Signed)
Rounding Note    Patient Name: Shaun Cummings Date of Encounter: 01/08/2022  [place] Cardiologist: Freada Bergeron, MD   Subjective   No chest pain. Abdominal pain is better. No events overnight   Inpatient Medications    Scheduled Meds:  amLODipine  10 mg Oral Daily   aspirin  81 mg Oral Daily   atorvastatin  80 mg Oral Daily   carvedilol  12.5 mg Oral BID WC   Chlorhexidine Gluconate Cloth  6 each Topical Daily   docusate sodium  100 mg Oral BID   hydrALAZINE  75 mg Oral Q8H   insulin aspart  0-15 Units Subcutaneous TID WC   insulin aspart  0-5 Units Subcutaneous QHS   insulin glargine-yfgn  15 Units Subcutaneous Daily   mupirocin ointment  1 Application Nasal BID   pantoprazole (PROTONIX) IV  40 mg Intravenous Q12H   sodium chloride flush  3 mL Intravenous Q12H   sodium chloride flush  3 mL Intravenous Q12H   Continuous Infusions:  sodium chloride     diphenhydrAMINE     heparin 2,100 Units/hr (01/07/22 2210)   sodium bicarbonate 150 mEq in sterile water 1,150 mL infusion 75 mL/hr at 01/07/22 1748   PRN Meds: sodium chloride, acetaminophen, acetaminophen, diazepam, diphenhydrAMINE, HYDROmorphone (DILAUDID) injection, naLOXone (NARCAN)  injection, ondansetron (ZOFRAN) IV, mouth rinse, polyethylene glycol, sodium chloride flush   Vital Signs    Vitals:   01/07/22 1500 01/07/22 2155 01/08/22 0630 01/08/22 0801  BP: 121/67 129/74 135/80 121/74  Pulse: 77 77 80 80  Resp: 20 18 16 16   Temp: 99 F (37.2 C) 98.5 F (36.9 C) 98.8 F (37.1 C) 98.6 F (37 C)  TempSrc: Oral Oral Oral Oral  SpO2: 95%  93% 92%  Weight:   112.5 kg   Height:        Intake/Output Summary (Last 24 hours) at 01/08/2022 0828 Last data filed at 01/07/2022 1800 Gross per 24 hour  Intake 576.19 ml  Output --  Net 576.19 ml      01/08/2022    6:30 AM 01/07/2022    5:00 AM 01/05/2022    5:00 AM  Last 3 Weights  Weight (lbs) 248 lb 244 lb 14.4 oz 236 lb 12.4 oz   Weight (kg) 112.492 kg 111.086 kg 107.4 kg      Telemetry    sinus - Personally Reviewed  ECG    No AM EKG  Physical Exam   General: Well developed, well nourished, NAD  SKIN: warm, dry. No rashes. Neuro: No focal deficits  Musculoskeletal: Muscle strength 5/5 all ext  Psychiatric: Mood and affect normal  Neck: No JVD Lungs:Clear bilaterally, no wheezes, rhonci, crackles Cardiovascular: Regular rate and rhythm. No murmurs, gallops or rubs. Abdomen:Soft. No TTP  Extremities: No lower extremity edema.   Labs    High Sensitivity Troponin:   Recent Labs  Lab 01/04/22 2037 01/04/22 2209 01/05/22 0321 01/05/22 0654 01/05/22 0910  TROPONINIHS 19* 22* 1,719* 4,399* 7,294*     Chemistry Recent Labs  Lab 01/04/22 2037 01/05/22 0321 01/06/22 0440 01/07/22 0809 01/07/22 1509 01/08/22 0207  NA 138 135   < > 132* 133* 131*  K 3.4* 3.4*   < > 3.7 3.6 3.5  CL 102 100   < > 104 105 102  CO2 22 23   < > 19* 19* 18*  GLUCOSE 242* 277*   < > 220* 165* 170*  BUN 24* 23*   < > 38*  40* 41*  CREATININE 1.90* 1.74*   < > 2.25* 2.42* 2.46*  CALCIUM 9.0 8.9   < > 8.4* 8.2* 8.0*  MG  --  1.8  --   --   --   --   PROT 7.4  --   --   --   --   --   ALBUMIN 3.1*  --   --   --   --   --   AST 17  --   --   --   --   --   ALT 14  --   --   --   --   --   ALKPHOS 81  --   --   --   --   --   BILITOT 0.3  --   --   --   --   --   GFRNONAA 44* 49*   < > 36* 33* 32*  ANIONGAP 14 12   < > 9 9 11    < > = values in this interval not displayed.    Lipids No results for input(s): "CHOL", "TRIG", "HDL", "LABVLDL", "LDLCALC", "CHOLHDL" in the last 168 hours.  Hematology Recent Labs  Lab 01/06/22 0302 01/07/22 1008 01/08/22 0207  WBC 24.6* 22.0* 23.4*  RBC 3.88* 3.49* 3.28*  HGB 11.3* 10.4* 9.6*  HCT 33.3* 29.2* 28.0*  MCV 85.8 83.7 85.4  MCH 29.1 29.8 29.3  MCHC 33.9 35.6 34.3  RDW 12.9 13.2 13.3  PLT 338 295 255   Thyroid No results for input(s): "TSH", "FREET4" in the last  168 hours.  BNPNo results for input(s): "BNP", "PROBNP" in the last 168 hours.  DDimer No results for input(s): "DDIMER" in the last 168 hours.   Radiology    US RENAL  Result Date: 01/07/2022 CLINICAL DATA:  Acute kidney injury with acute tubular necrosis. EXAM: RENAL / URINARY TRACT ULTRASOUND COMPLETE COMPARISON:  Abdominal CTA 01/04/2022 FINDINGS: Right Kidney: Renal measurements: 12.1 x 6.3 x 6.1 cm = volume: 243 mL. Mildly increased parenchymal echogenicity. No hydronephrosis. No visualized stone or focal lesion. Left Kidney: Renal measurements: 12.5 x 7.9 x 5.1 cm = volume: 263 mL. Mildly increased parenchymal echogenicity. No hydronephrosis. Renal calculi on recent CT are not seen by ultrasound. No focal lesion. Bladder: Appears normal for degree of bladder distention. Other: None. IMPRESSION: 1. Mildly increased renal parenchymal echogenicity suggestive of chronic medical renal disease. 2. No obstructive uropathy. 3. Left renal stones on recent CT are not seen by ultrasound. Electronically Signed   By: Keith Rake M.D.   On: 01/07/2022 18:35   DG Abd 2 Views  Result Date: 01/07/2022 CLINICAL DATA:  Lower abdominal pain EXAM: ABDOMEN - 2 VIEW COMPARISON:  01/04/2022 FINDINGS: The bowel gas pattern is normal. There is no evidence of free air. Moderate volume of stool within the colon. Excreted contrast within the urinary bladder. No radio-opaque calculi or other significant radiographic abnormality is seen. IMPRESSION: Nonobstructive bowel gas pattern. Moderate volume of stool within the colon. Electronically Signed   By: Davina Poke D.O.   On: 01/07/2022 10:59    Cardiac Studies   Cath: 01/06/2022    Dist LAD lesion is 50% stenosed.   Prox LAD lesion is 70% stenosed.   Ost RCA to Prox RCA lesion is 75% stenosed.   Prox RCA lesion is 75% stenosed.   Prox RCA to Mid RCA lesion is 95% stenosed.   Mid RCA lesion is 50% stenosed.   1st Mrg  lesion is 50% stenosed.   Mid Cx-2  lesion is 70% stenosed.   2nd Mrg lesion is 95% stenosed.   Mid Cx-1 lesion is 50% stenosed.   Severe multivessel CAD with the dominant RCA being the "culprit" vessel contributing to the non-STEMI with 70% diffuse proximal stenosis followed by 70% and 95% thrombotic mid stenosis and 50% distal stenosis.   There is 70% diffuse proximal LAD stenosis with diffuse 50% distal LAD stenosis.   The left circumflex vessel has diffuse 50% stenosis in the small OM 2 vessel with 95% focal stenosis in the OM 3 vessel and bifurcation AV groove 50% stenosis followed by 70% distal stenosis.   LVEDP elevated at 27 mm.   RECOMMENDATION: Surgical consultation will be recommended for CABG revascularization.  Will resume heparin 8 hours post sheath discontinuance.  Initiate medical therapy for significant CAD and aggressive lipid-lowering therapy with target LDL less than 55.  Smoking cessation is essential.    Diagnostic Dominance: Right  Echo: 01/05/2022  IMPRESSIONS     1. Left ventricular ejection fraction, by estimation, is 35 to 40%. The  left ventricle has moderately decreased function. The left ventricle  demonstrates regional wall motion abnormalities (see scoring  diagram/findings for description). The left  ventricular internal cavity size was mildly dilated. Left ventricular  diastolic parameters are consistent with Grade I diastolic dysfunction  (impaired relaxation). There is hypokinesis of the left ventricular,  septal wall, anterior wall and lateral wall.   There is severe hypokinesis of the left ventricular, basal-mid inferior  wall.   2. Right ventricular systolic function is normal. The right ventricular  size is normal.   3. Left atrial size was moderately dilated.   4. Right atrial size was mildly dilated.   5. The mitral valve is normal in structure. Trivial mitral valve  regurgitation. No evidence of mitral stenosis.   6. The aortic valve is grossly normal. Aortic valve  regurgitation is not  visualized. No aortic stenosis is present.   7. The inferior vena cava is normal in size with greater than 50%  respiratory variability, suggesting right atrial pressure of 3 mmHg.   Comparison(s): No prior Echocardiogram.   Conclusion(s)/Recommendation(s): Reduced LVEF, with global hypokinesis but  also severe focal hypokinesis of basal to mid inferior wall. Results will  be communicated to primary team.   FINDINGS   Left Ventricle: Left ventricular ejection fraction, by estimation, is 35  to 40%. The left ventricle has moderately decreased function. The left  ventricle demonstrates regional wall motion abnormalities. Severe  hypokinesis of the left ventricular,  basal-mid inferior wall. The left ventricular internal cavity size was  mildly dilated. There is borderline left ventricular hypertrophy. Left  ventricular diastolic parameters are consistent with Grade I diastolic  dysfunction (impaired relaxation).   Right Ventricle: The right ventricular size is normal. No increase in  right ventricular wall thickness. Right ventricular systolic function is  normal.   Left Atrium: Left atrial size was moderately dilated.   Right Atrium: Right atrial size was mildly dilated.   Pericardium: There is no evidence of pericardial effusion.   Mitral Valve: The mitral valve is normal in structure. Trivial mitral  valve regurgitation. No evidence of mitral valve stenosis.   Tricuspid Valve: The tricuspid valve is normal in structure. Tricuspid  valve regurgitation is trivial. No evidence of tricuspid stenosis.   Aortic Valve: The aortic valve is grossly normal. Aortic valve  regurgitation is not visualized. No aortic stenosis is present.  Pulmonic Valve: The pulmonic valve was grossly normal. Pulmonic valve  regurgitation is not visualized. No evidence of pulmonic stenosis.   Aorta: The aortic root, ascending aorta and aortic arch are all  structurally normal,  with no evidence of dilitation or obstruction.   Venous: The inferior vena cava is normal in size with greater than 50%  respiratory variability, suggesting right atrial pressure of 3 mmHg.   IAS/Shunts: The atrial septum is grossly normal.   Patient Profile     46 y.o. male with a hx of HTN, HLD, DM and OSA who is being seen 01/05/2022 for the evaluation of elevated troponin at the request of Dr. Carlis Abbott.   Assessment & Plan    CAD/NSTEMI: Cardiac cath 01/06/22 with severe multivessel CAD with dominant RCA being culprit vessel with 70% diffuse proximal stenosis followed by 70 and 95% thrombotic mid stenosis, 70% proximal LAD stenosis with 50% distal LAD stenosis, 50% stenosis in small OM 2, 95% focal stenosis in OM 3 and bifurcation AV groove 50% stenosis.  Seen by [facility] with recommendations for CABG pending renal function. His creatinine is 2.46 today. Will continue ASA, statin, Coreg, Norvasc, hydralazine and IV heparin.   Ischemic cardiomyopathy/chronic systolic CHF: Echo LVEF of 35-40%, global hypokinesis but more severe in basal-inferior wall. Continue medical therapy.    Hypertensive emergency: BP now well controlled. No changes.    Penetrating abdominal aortic ulcer: Initially presented with abdominal/back pain.  Mural thrombus of abdominal aorta with small penetrating ulcer in the infrarenal abdominal aorta measuring 3 mm with no mural hematoma. Evaluated by [facility] with recommendations for pain management and blood pressure control, repeat scan 4 to 6 weeks as long as no recurrent pain.   AKI in the setting of hypertensive emergency: Creatinine up to 2.46 today. Repeat BMET tomorrow.    DM -- Hgb A1c 11.5 11/2021 -- on SSI while inpatient, adjust appropriately -- jardiance/glipizide outpatient, holding for now   Prior tobacco use -- reports cessation 3 weeks ago  For questions or updates, please contact [facility] Please consult [email or web address] for contact info under     Lauree Chandler, MD, [facility] 01/08/2022 8:28 AM

## 2022-01-08 NOTE — Progress Notes (Signed)
2 Days Post-Op Procedure(s) (LRB): LEFT HEART CATH AND CORONARY ANGIOGRAPHY (N/A) Subjective: Developed recurrent mid abdominal pain yesterday that was severe. Seen by GI. He had KUB showing stool and had 2 BM's yesterday but still has the pain. No chest pain or SOB.  Objective: Vital signs in last 24 hours: Temp:  [98.1 F (36.7 C)-99 F (37.2 C)] 98.8 F (37.1 C) (12/14 0630) Pulse Rate:  [77-86] 80 (12/14 0630) Cardiac Rhythm: Normal sinus rhythm (12/13 2040) Resp:  [16-20] 16 (12/14 0630) BP: (110-140)/(67-80) 135/80 (12/14 0630) SpO2:  [93 %-95 %] 93 % (12/14 0630) Weight:  [112.5 kg] 112.5 kg (12/14 0630)  Hemodynamic parameters for last 24 hours:    Intake/Output from previous day: 12/13 0701 - 12/14 0700 In: 576.2 [I.V.:576.2] Out: -  Intake/Output this shift: No intake/output data recorded.  General appearance: alert, cooperative, and looks uncomfortable Heart: regular rate and rhythm, S1, S2 normal, no murmur, click, rub or gallop Lungs: clear to auscultation bilaterally Abdomen: soft, obese. Tender upper abdomen. Bowel sounds present  Lab Results: Recent Labs    01/07/22 1008 01/08/22 0207  WBC 22.0* 23.4*  HGB 10.4* 9.6*  HCT 29.2* 28.0*  PLT 295 255   BMET:  Recent Labs    01/07/22 1509 01/08/22 0207  NA 133* 131*  K 3.6 3.5  CL 105 102  CO2 19* 18*  GLUCOSE 165* 170*  BUN 40* 41*  CREATININE 2.42* 2.46*  CALCIUM 8.2* 8.0*    PT/INR: No results for input(s): "LABPROT", "INR" in the last 72 hours. ABG    Component Value Date/Time   TCO2 25 02/10/2016 1307   CBG (last 3)  Recent Labs    01/07/22 1147 01/07/22 1628 01/07/22 2213  GLUCAP 191* 182* 149*    Assessment/Plan:  Severe multivessel CAD s/p NSTEMI.   Stage 3b CKD. His creatinine has risen from 1.95 pre-cath to 2.25 yesterday morning, 2.42 yesterday afternoon and is 2.46 this am. I would wait to do CABG until creat comes back down to minimize risk or further AKI and possible  need for postop dialysis. He has poor diabetic control with Hgb A1c of 11.5 last month and 9.9 on admission and with uncontrolled HTN his risk of AKI with CABG is significant. It is unclear what is causing his abdominal pain. This could be an anginal equivalent. He does have a very small penetrating abdominal aortic ulcer which I would not expect to be giving him this pain. He had leukocytosis of 17.4 on admission and has gone up to 23.4 this am but could be due to MI, steroids for contrast or other undiagnosed problem. I think delaying CABG for a few more days and avoiding any nephrotoxins is going to be best to minimize the risk of further renal dysfunction. He and wife understand.   LOS: 4 days    Gaye Pollack 01/08/2022

## 2022-01-08 NOTE — Progress Notes (Addendum)
  Progress Note    01/08/2022 7:28 AM 2 Days Post-Op  Subjective:  says pain is more manageable today, but he explains that still feels like "there is a football size pain in my stomach". Pain is still worse with movement. Reports 2 bms yesterday with minimal improvement of pain   Vitals:   01/07/22 2155 01/08/22 0630  BP: 129/74 135/80  Pulse: 77 80  Resp: 18 16  Temp: 98.5 F (36.9 C) 98.8 F (37.1 C)  SpO2:  93%   Physical Exam: Cardiac:  regular Lungs:  non labored Extremities:  well perfused and warm. 2+ radial pulses, 2+ femoral and Dp pulses bilaterally Abdomen: obese, soft, non distended. mild tenderness to palpation in epigastric region and across lower abdomen. No guarding or rebound tenderness Neurologic: alert and oriented  CBC    Component Value Date/Time   WBC 23.4 (H) 01/08/2022 0207   RBC 3.28 (L) 01/08/2022 0207   HGB 9.6 (L) 01/08/2022 0207   HCT 28.0 (L) 01/08/2022 0207   PLT 255 01/08/2022 0207   MCV 85.4 01/08/2022 0207   MCH 29.3 01/08/2022 0207   MCHC 34.3 01/08/2022 0207   RDW 13.3 01/08/2022 0207   LYMPHSABS 1.1 01/07/2022 1008   MONOABS 1.8 (H) 01/07/2022 1008   EOSABS 0.0 01/07/2022 1008   BASOSABS 0.0 01/07/2022 1008    BMET    Component Value Date/Time   NA 131 (L) 01/08/2022 0207   NA 141 04/30/2021 1114   K 3.5 01/08/2022 0207   CL 102 01/08/2022 0207   CO2 18 (L) 01/08/2022 0207   GLUCOSE 170 (H) 01/08/2022 0207   BUN 41 (H) 01/08/2022 0207   BUN 17 04/30/2021 1114   CREATININE 2.46 (H) 01/08/2022 0207   CREATININE 0.91 09/15/2013 1727   CALCIUM 8.0 (L) 01/08/2022 0207   GFRNONAA 32 (L) 01/08/2022 0207   GFRAA >60 09/08/2019 1328    INR No results found for: "INR"   Intake/Output Summary (Last 24 hours) at 01/08/2022 0728 Last data filed at 01/07/2022 1800 Gross per 24 hour  Intake 576.19 ml  Output --  Net 576.19 ml     Assessment/Plan:  46 y.o. male with penetrating aortic ulcer  Still with abdominal  pain. Some improvement from yesterday with pain regimen Leukocytosis w/o fever Had BM x 2 yesterday with minimal relief of pain Continue PPI and bowel regimen per GI Holding on CT for now due to worsening creatinine Would continue hydration May need CT non contrast however would be limited in regard to vascular findings   DVT prophylaxis:  heparin gtt   Karoline Caldwell, PA-C [facility] [phone] 01/08/2022 7:28 AM  I have independently interviewed and examined patient and agree with PA assessment and plan above.  At the time of my exam the patient is not exhibiting any abdominal pain and states that he is much better from yesterday.  Would need CT with contrast to evaluate his aorta at this time does not appear that risks would outweigh the benefits.  Vascular surgery will continue to follow.  Cristel Rail C. Donzetta Matters, MD [facility] [place] Office: [phone] Pager: [phone]

## 2022-01-08 NOTE — Consult Note (Addendum)
Shaun Cummings Admit Date: 01/04/2022 01/08/2022 Shaun Cummings Requesting Physician:  Rai MD  Reason for Consult:  AKI HPI:  56M presented to the ED 12/10 with complaints of chest and epigastric pain.  Workup identified NSTEMI along with an inferior abdominal aorta mural thrombus with evidence for a small ulcer, based upon CTA.  Blood pressures are quite elevated and patient was treated for hypertensive emergency with parenteral esmolol and clevidipine.  Seen by vascular surgery with current plan of ongoing observation.  Cardiology following and he underwent left heart catheterization with findings of multivessel CAD.  Further LVEF now 35 to 40%.  Patient has been seen by cardiac surgery with plan for CABG as early as tomorrow.  Other PMH includes longstanding DM2 with severe retinopathy, hypertension with hypertensive retinopathy, OSA, recent cessation of longstanding tobacco use, hyperlipidemia.  Creatinine earlier this year was 1.18, it was 1.90 at presentation and has worsened to 2.46 today.  In November of this year he had severe macroalbuminuria with a value of 10,120, which was negative in 2019.  UA at that time also had hematuria.  UA today without hematuria or pyuria but 4+ protein present.  Renal ultrasound 12/13 with normal-sized kidneys with mild increased echogenicity bilaterally but no evidence of obstruction.  CTA of the chest on 12/10 had some nonobstructive left kidney stones present without evidence of hydronephrosis.  Blood pressures have normalized on amlodipine, carvedilol, hydralazine.  He is receiving sodium bicarbonate infusion for contrast exposure.  Urine output is not fully recorded but he feels it is pretty normal.  He denies use of NSAIDs or recent antibiotics prior to presentation.  Medications prior to admission included olmesartan, empagliflozin, amlodipine.  No significant dyspnea.  No edema.  No gross hematuria, tea colored urine, petechia/purpura.  Has some family  history of kidney disease but is not quite sure of specifics.   Creat (mg/dL)  Date Value  09/15/2013 0.91   Creatinine, Ser (mg/dL)  Date Value  01/08/2022 2.46 (H)  01/07/2022 2.42 (H)  01/07/2022 2.25 (H)  01/06/2022 1.95 (H)  01/05/2022 1.74 (H)  01/04/2022 1.90 (H)  04/30/2021 1.18  07/08/2020 0.95  06/19/2020 0.77  06/18/2020 0.78  ] I/Os: I/O last 3 completed shifts: In: 1126.8 [P.O.:340; I.V.:786.8] Out: 300 [Urine:300]   ROS Balance of 12 systems is negative w/ exceptions as above  PMH  Past Medical History:  Diagnosis Date   Chest pain    HTN (hypertension)    Hyperlipidemia    Lesion of penis    foreskin   OSA (obstructive sleep apnea)    per pt dx osa and used cpap but after losing wt. from 415 pounds down to 244 pounds no longer needs cpap   Peripheral neuropathy    Phimosis    Scrotal abscess 05/08/2017   Type 2 diabetes mellitus ([place])    per pt no meds for two years pt was changed to levemir unable to afford but pt states is waiting on approval for a program he applied for that will pay for his meds ([place])     [facility]  Past Surgical History:  Procedure Laterality Date   CIRCUMCISION N/A 02/10/2016   Procedure: CIRCUMCISION ADULT;  Surgeon: Cleon Gustin, MD;  Location: [facility];  Service: Urology;  Laterality: N/A;   INCISION AND DRAINAGE ABSCESS Right 05/12/2017   Procedure: INCISION AND DRAINAGE RIGHT INGUINAL ABSCESS;  Surgeon: Erroll Luna, MD;  Location: [place];  Service: General;  Laterality: Right;  INCISION AND DRAINAGE ABSCESS N/A 03/24/2018   Procedure: INCISION AND DRAINAGE POSTERIOR NECK ABSCESS;  Surgeon: Excell Seltzer, MD;  Location: [place];  Service: General;  Laterality: N/A;   INCISION AND DRAINAGE ABSCESS Right 06/19/2020   Procedure: INCISION AND DRAINAGE ABSCESS, RIGHT GROIN;  Surgeon: Johnathan Hausen, MD;  Location: [facility];  Service: General;  Laterality: Right;   INCISION AND DRAINAGE  PERIRECTAL ABSCESS Right 05/09/2017   Procedure: IRRIGATION AND DEBRIDEMENT PERINEAL ABSCESS;  Surgeon: Donnie Mesa, MD;  Location: [place];  Service: General;  Laterality: Right;   LEFT HEART CATH AND CORONARY ANGIOGRAPHY N/A 01/06/2022   Procedure: LEFT HEART CATH AND CORONARY ANGIOGRAPHY;  Surgeon: Troy Sine, MD;  Location: [place] [facility];  Service: Cardiovascular;  Laterality: N/A;   FH  Family History  Problem Relation Age of Onset   Diabetes Mother    Angina Mother    CAD Mother    Heart disease Mother    Glaucoma Father    Diabetes Father    Heart attack Father    Kidney failure Father    Diabetes Brother    Glaucoma Maternal Grandmother    Diabetes Maternal Grandmother    Heart disease Maternal Grandmother    Glaucoma Maternal Grandfather    Diabetes Maternal Grandfather    Heart disease Maternal Grandfather    Glaucoma Paternal Grandmother    Diabetes Paternal Grandmother    Glaucoma Paternal Grandfather    Diabetes Paternal Grandfather    Asthma Son    [place]  reports that he quit smoking about 13 years ago. His smoking use included cigarettes. He has never used smokeless tobacco. He reports that he does not drink alcohol and does not use drugs. Allergies  Allergies  Allergen Reactions   Augmentin [Amoxicillin-Pot Clavulanate] Hives and Itching    Has patient had a PCN reaction causing immediate rash, facial/tongue/throat swelling, SOB or lightheadedness with hypotension: Yes Has patient had a PCN reaction causing severe rash involving mucus membranes or skin necrosis: No Has patient had a PCN reaction that required hospitalization: No Has patient had a PCN reaction occurring within the last 10 years: No If all of the above answers are "NO", then may proceed with Cephalosporin use.   Contrast Media [Iodinated Contrast Media] Hives    Pt broke out with 1 hive on forehead after CT injection-treated with 25mg  Benadryl   Home medications Prior to Admission  medications   Medication Sig Start Date End Date Taking? Authorizing Provider  amlodipine-olmesartan (AZOR) 10-20 MG tablet Take 1 tablet by mouth daily. 12/30/21 03/30/22 Yes Iona Beard, MD  empagliflozin (JARDIANCE) 10 MG TABS tablet Take 1 tablet (10 mg total) by mouth daily before breakfast. 12/30/21 12/25/22 Yes Iona Beard, MD  glipiZIDE (GLUCOTROL) 5 MG tablet Take 1 tablet (5 mg total) by mouth daily before breakfast. 05/21/21 02/15/22 Yes Gaylan Gerold, DO  predniSONE (DELTASONE) 50 MG tablet Take 1 tablet (50 mg total) by mouth as directed. Please take 1 tablet 13 hours, 7 hours, and 1 hour prior to CT hematuria scan to prevent contrast reaction Patient not taking: Reported on 01/04/2022 12/22/21   Iona Beard, MD  Continuous Blood Gluc Sensor (FREESTYLE LIBRE 3 SENSOR) MISC 1 Units by Does not apply route every 14 (fourteen) days. Place 1 sensor on the skin every 14 days. Use to check glucose continuously 12/30/21   Iona Beard, MD    Current Medications Scheduled Meds:  amLODipine  10 mg Oral Daily   aspirin  81 mg Oral Daily   atorvastatin  80 mg Oral Daily   carvedilol  12.5 mg Oral BID WC   Chlorhexidine Gluconate Cloth  6 each Topical Daily   docusate sodium  100 mg Oral BID   hydrALAZINE  75 mg Oral Q8H   insulin aspart  0-15 Units Subcutaneous TID WC   insulin aspart  0-5 Units Subcutaneous QHS   insulin glargine-yfgn  15 Units Subcutaneous Daily   mupirocin ointment  1 Application Nasal BID   pantoprazole (PROTONIX) IV  40 mg Intravenous Q12H   sodium chloride flush  3 mL Intravenous Q12H   sodium chloride flush  3 mL Intravenous Q12H   Continuous Infusions:  sodium chloride     diphenhydrAMINE     heparin 2,100 Units/hr (01/08/22 1033)   sodium bicarbonate 150 mEq in sterile water 1,150 mL infusion 75 mL/hr at 01/08/22 0831   PRN Meds:.sodium chloride, acetaminophen, acetaminophen, diazepam, diphenhydrAMINE, HYDROmorphone (DILAUDID) injection, naLOXone  (NARCAN)  injection, ondansetron (ZOFRAN) IV, mouth rinse, polyethylene glycol, sodium chloride flush  CBC Recent Labs  Lab 01/06/22 0302 01/07/22 1008 01/08/22 0207  WBC 24.6* 22.0* 23.4*  NEUTROABS  --  18.9*  --   HGB 11.3* 10.4* 9.6*  HCT 33.3* 29.2* 28.0*  MCV 85.8 83.7 85.4  PLT 338 295 321   Basic Metabolic Panel Recent Labs  Lab 01/04/22 2037 01/05/22 0321 01/06/22 0440 01/07/22 0809 01/07/22 1509 01/08/22 0207  NA 138 135 134* 132* 133* 131*  K 3.4* 3.4* 4.4 3.7 3.6 3.5  CL 102 100 105 104 105 102  CO2 22 23 19* 19* 19* 18*  GLUCOSE 242* 277* 236* 220* 165* 170*  BUN 24* 23* 32* 38* 40* 41*  CREATININE 1.90* 1.74* 1.95* 2.25* 2.42* 2.46*  CALCIUM 9.0 8.9 8.4* 8.4* 8.2* 8.0*    Physical Exam  Blood pressure 121/74, pulse 80, temperature 98.6 F (37 C), temperature source Oral, resp. rate 16, height 5\' 11"  (1.803 m), weight 112.5 kg, SpO2 92 %. GEN: NAD ENT: NCAT EYES: EOMI CV: Regular, normal S1 and S2, no rub PULM: Diminished in the bases, clear ABD: Soft, nontender.  No rebound or guarding. SKIN: No rashes or lesions EXT: No lower extremity edema NEURO: CN II through XII grossly intact, AAO x3   Assessment 89M presenting with hypertensive emergency, NSTEMI with findings of multivessel disease with reduced ejection fraction with plan for CABG, aortic mural thrombus with small penetrating ulcer followed by vascular surgery, now with AKI and established severe albuminuria.  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; doubt additinoal GN present Most recent UA without hematuria Renal ultrasound reassuring with findings consistent with medical renal disease Acute insult almost certainly is contrast and acute illness /hypertensive emergency NSTEMI with LHC showing multivessel disease with plan for CABG Hypertensive emergency at admission, improved on oral  agents Penetrating aortic ulcer followed by vascular surgery Abdominal pain, question if related #4, GI following Metabolic acidosis on sodium bicarbonate drip Mild hyponatremia, monitor Leukocytosis, stable Normocytic anemia Recent tobacco cessation OSA Hyperlipidemia  Plan Cont supportive care, no RRT needs Cont IVFs for now, esp with possible additional contrast exposure as CTA for # 5 UP/C Daily weights, Daily Renal Panel, Strict I/Os, Avoid nephrotoxins (NSAIDs, judicious IV Contrast) Will follow along closely   Shaun Cummings  01/08/2022, 11:37 AM

## 2022-01-08 NOTE — Progress Notes (Signed)
Progress Note   Subjective  Patient states he is feeling better today - abdominal pain not as bad but has not resolved. Located periumbilical location. Renal function slightly worse. Did not get CTA yesterday.    Objective   Vital signs in last 24 hours: Temp:  [98.5 F (36.9 C)-99 F (37.2 C)] 98.6 F (37 C) (12/14 0801) Pulse Rate:  [77-80] 80 (12/14 0801) Resp:  [16-20] 16 (12/14 0801) BP: (110-135)/(67-80) 121/74 (12/14 0801) SpO2:  [92 %-95 %] 92 % (12/14 0801) Weight:  [112.5 kg] 112.5 kg (12/14 0630) Last BM Date : 01/04/22 General:    white male in NAD Abdomen:  Soft, mild periumbilical tenderness to palpation.  Neurologic:  Alert and oriented,  grossly normal neurologically. Psych:  Cooperative. Normal mood and affect.  Intake/Output from previous day: 12/13 0701 - 12/14 0700 In: 576.2 [I.V.:576.2] Out: -  Intake/Output this shift: No intake/output data recorded.  Lab Results: Recent Labs    01/06/22 0302 01/07/22 1008 01/08/22 0207  WBC 24.6* 22.0* 23.4*  HGB 11.3* 10.4* 9.6*  HCT 33.3* 29.2* 28.0*  PLT 338 295 255   BMET Recent Labs    01/07/22 0809 01/07/22 1509 01/08/22 0207  NA 132* 133* 131*  K 3.7 3.6 3.5  CL 104 105 102  CO2 19* 19* 18*  GLUCOSE 220* 165* 170*  BUN 38* 40* 41*  CREATININE 2.25* 2.42* 2.46*  CALCIUM 8.4* 8.2* 8.0*   LFT No results for input(s): "PROT", "ALBUMIN", "AST", "ALT", "ALKPHOS", "BILITOT", "BILIDIR", "IBILI" in the last 72 hours. PT/INR No results for input(s): "LABPROT", "INR" in the last 72 hours.  Studies/Results: US RENAL  Result Date: 01/07/2022 CLINICAL DATA:  Acute kidney injury with acute tubular necrosis. EXAM: RENAL / URINARY TRACT ULTRASOUND COMPLETE COMPARISON:  Abdominal CTA 01/04/2022 FINDINGS: Right Kidney: Renal measurements: 12.1 x 6.3 x 6.1 cm = volume: 243 mL. Mildly increased parenchymal echogenicity. No hydronephrosis. No visualized stone or focal lesion. Left Kidney: Renal  measurements: 12.5 x 7.9 x 5.1 cm = volume: 263 mL. Mildly increased parenchymal echogenicity. No hydronephrosis. Renal calculi on recent CT are not seen by ultrasound. No focal lesion. Bladder: Appears normal for degree of bladder distention. Other: None. IMPRESSION: 1. Mildly increased renal parenchymal echogenicity suggestive of chronic medical renal disease. 2. No obstructive uropathy. 3. Left renal stones on recent CT are not seen by ultrasound. Electronically Signed   By: Keith Rake M.D.   On: 01/07/2022 18:35   DG Abd 2 Views  Result Date: 01/07/2022 CLINICAL DATA:  Lower abdominal pain EXAM: ABDOMEN - 2 VIEW COMPARISON:  01/04/2022 FINDINGS: The bowel gas pattern is normal. There is no evidence of free air. Moderate volume of stool within the colon. Excreted contrast within the urinary bladder. No radio-opaque calculi or other significant radiographic abnormality is seen. IMPRESSION: Nonobstructive bowel gas pattern. Moderate volume of stool within the colon. Electronically Signed   By: Davina Poke D.O.   On: 01/07/2022 10:59       Assessment / Plan:    46 y/o male with the following:  Periumbilical pain NSTEMI / CAD awaiting CABG Aortic ulcer Renal failure  His pain is improved today although not resolved. Elevated WBC noted. CTA would be test of choice given vascular abnormalities on prior CT however due to rising creatinine that was not pursued. CABG is pending however is postponed due to his renal failure. I don't think this is upper GI tract pain given location, although agree  with empiric PPI. He is not a good endoscopic candidate with his recent MI. If his creatinine improves then pursue CTA. If not and pain persists / worsens today, then would do a non-con CT abdomen / pelvis.   Please call with questions.  Jolly Mango, MD [facility] Gastroenterology

## 2022-01-08 NOTE — Progress Notes (Signed)
[place] for heparin  Indication: chest pain/ACS  Allergies  Allergen Reactions   Augmentin [Amoxicillin-Pot Clavulanate] Hives and Itching    Has patient had a PCN reaction causing immediate rash, facial/tongue/throat swelling, SOB or lightheadedness with hypotension: Yes Has patient had a PCN reaction causing severe rash involving mucus membranes or skin necrosis: No Has patient had a PCN reaction that required hospitalization: No Has patient had a PCN reaction occurring within the last 10 years: No If all of the above answers are "NO", then may proceed with Cephalosporin use.   Contrast Media [Iodinated Contrast Media] Hives    Pt broke out with 1 hive on forehead after CT injection-treated with 25mg  Benadryl    Patient Measurements: Height: 5\' 11"  (180.3 cm) Weight: 111.1 kg (244 lb 14.4 oz) IBW/kg (Calculated) : 75.3 Heparin Dosing Weight: 99.5 kg  Vital Signs: Temp: 98.5 F (36.9 C) (12/13 2155) Temp Source: Oral (12/13 2155) BP: 129/74 (12/13 2155) Pulse Rate: 77 (12/13 2155)  Labs: Recent Labs    01/05/22 0654 01/05/22 0910 01/05/22 2112 01/06/22 0302 01/06/22 0440 01/07/22 0809 01/07/22 1008 01/07/22 1455 01/07/22 1509 01/08/22 0207  HGB  --   --    < > 11.3*  --   --  10.4*  --   --  9.6*  HCT  --   --   --  33.3*  --   --  29.2*  --   --  28.0*  PLT  --   --   --  338  --   --  295  --   --  255  HEPARINUNFRC  --   --    < > 0.17*  --  0.17*  --  0.21*  --  0.42  CREATININE  --   --   --   --    < > 2.25*  --   --  2.42* 2.46*  TROPONINIHS 4,399* 7,294*  --   --   --   --   --   --   --   --    < > = values in this interval not displayed.     Estimated Creatinine Clearance: 48.6 mL/min (A) (by C-G formula based on SCr of 2.46 mg/dL (H)).   Medical History: Past Medical History:  Diagnosis Date   Chest pain    HTN (hypertension)    Hyperlipidemia    Lesion of penis    foreskin   OSA (obstructive sleep  apnea)    per pt dx osa and used cpap but after losing wt. from 415 pounds down to 244 pounds no longer needs cpap   Peripheral neuropathy    Phimosis    Scrotal abscess 05/08/2017   Type 2 diabetes mellitus ([place])    per pt no meds for two years pt was changed to levemir unable to afford but pt states is waiting on approval for a program he applied for that will pay for his meds ([place])      Assessment: 46 yo M with c/f NSTEMI with elevated troponins and EKG changes. No AC PTA.  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 Heparin level came back subtherapeutic at 0.17, on 1600 units/hr. No s/sx of bleeding or infusion issues.  12/14 AM update:  Heparin level therapeutic  Planning CABG once Scr stabilizes   Goal of Therapy:  Heparin level 0.3-0.7 units/ml Monitor platelets by anticoagulation protocol: Yes   Plan:  Cont  heparin 2100 units/hr 1200 heparin level  Narda Bonds, PharmD, [place] Clinical Pharmacist Phone: [phone]

## 2022-01-08 NOTE — Progress Notes (Addendum)
  Pt c/o chest pain, 7/10. Stated, its a sharp pain felt more on right side and radiate to his back. Awake, and alert.  BP 110/63. HR 76, 96% on room air. EKG performed. X1 dose of nitroglycerin SL PRN given. MD notified.

## 2022-01-08 NOTE — Progress Notes (Addendum)
Pt's wife stepped out into the hall to wave this RN into the room. She states pt is having trouble breathing. This RN went to room to assess. Pt is laying in bed calm with eyes closed. When asked about his chest pain, he states he is not having the same pain as before but feels more like he is having trouble breathing. He has a dull pain that goes from his right nipple to his back. Vital signs are stable. This RN paged on call MD. Received order to apply oxygen on pt and one time dose for Morphine. Information shared with Primary RN

## 2022-01-09 ENCOUNTER — Other Ambulatory Visit: Payer: Self-pay

## 2022-01-09 DIAGNOSIS — N1832 Chronic kidney disease, stage 3b: Secondary | ICD-10-CM | POA: Diagnosis not present

## 2022-01-09 DIAGNOSIS — I719 Aortic aneurysm of unspecified site, without rupture: Secondary | ICD-10-CM | POA: Diagnosis not present

## 2022-01-09 DIAGNOSIS — R1033 Periumbilical pain: Secondary | ICD-10-CM | POA: Diagnosis not present

## 2022-01-09 DIAGNOSIS — I7143 Infrarenal abdominal aortic aneurysm, without rupture: Secondary | ICD-10-CM | POA: Diagnosis not present

## 2022-01-09 DIAGNOSIS — I2510 Atherosclerotic heart disease of native coronary artery without angina pectoris: Secondary | ICD-10-CM | POA: Diagnosis not present

## 2022-01-09 DIAGNOSIS — I214 Non-ST elevation (NSTEMI) myocardial infarction: Secondary | ICD-10-CM | POA: Diagnosis not present

## 2022-01-09 DIAGNOSIS — I161 Hypertensive emergency: Secondary | ICD-10-CM | POA: Diagnosis not present

## 2022-01-09 DIAGNOSIS — N179 Acute kidney failure, unspecified: Secondary | ICD-10-CM | POA: Diagnosis not present

## 2022-01-09 LAB — GLUCOSE, CAPILLARY
Glucose-Capillary: 175 mg/dL — ABNORMAL HIGH (ref 70–99)
Glucose-Capillary: 268 mg/dL — ABNORMAL HIGH (ref 70–99)
Glucose-Capillary: 194 mg/dL — ABNORMAL HIGH (ref 70–99)
Glucose-Capillary: 161 mg/dL — ABNORMAL HIGH (ref 70–99)

## 2022-01-09 LAB — BLOOD GAS, ARTERIAL
Acid-Base Excess: 1.4 mmol/L (ref 0.0–2.0)
Bicarbonate: 24.9 mmol/L (ref 20.0–28.0)
Drawn by: 67511
O2 Saturation: 99 %
Patient temperature: 37.1
pCO2 arterial: 35 mmHg (ref 32–48)
pH, Arterial: 7.46 — ABNORMAL HIGH (ref 7.35–7.45)
pO2, Arterial: 99 mmHg (ref 83–108)

## 2022-01-09 LAB — HEPARIN LEVEL (UNFRACTIONATED): Heparin Unfractionated: 0.28 IU/mL — ABNORMAL LOW (ref 0.30–0.70)

## 2022-01-09 LAB — URINE CULTURE: Culture: <10000 — AB

## 2022-01-09 LAB — BASIC METABOLIC PANEL
Anion gap: 12 (ref 5–15)
BUN: 51 mg/dL — ABNORMAL HIGH (ref 6–20)
CO2: 22 mmol/L (ref 22–32)
Calcium: 7.4 mg/dL — ABNORMAL LOW (ref 8.9–10.3)
Chloride: 94 mmol/L — ABNORMAL LOW (ref 98–111)
Creatinine, Ser: 3.01 mg/dL — ABNORMAL HIGH (ref 0.61–1.24)
GFR, Estimated: 25 mL/min — ABNORMAL LOW (ref >60)
Glucose, Bld: 162 mg/dL — ABNORMAL HIGH (ref 70–99)
Potassium: 3.1 mmol/L — ABNORMAL LOW (ref 3.5–5.1)
Sodium: 128 mmol/L — ABNORMAL LOW (ref 135–145)

## 2022-01-09 LAB — CBC
HCT: 26 % — ABNORMAL LOW (ref 39.0–52.0)
Hemoglobin: 9.1 g/dL — ABNORMAL LOW (ref 13.0–17.0)
MCH: 29.6 pg (ref 26.0–34.0)
MCHC: 35 g/dL (ref 30.0–36.0)
MCV: 84.7 fL (ref 80.0–100.0)
Platelets: 270 10*3/uL (ref 150–400)
RBC: 3.07 MIL/uL — ABNORMAL LOW (ref 4.22–5.81)
RDW: 13.2 % (ref 11.5–15.5)
WBC: 22.7 10*3/uL — ABNORMAL HIGH (ref 4.0–10.5)
nRBC: 0 % (ref 0.0–0.2)

## 2022-01-09 MED ORDER — INSULIN GLARGINE-YFGN 100 UNIT/ML ~~LOC~~ SOLN
18.0000 [IU] | Freq: Every day | SUBCUTANEOUS | Status: DC
  Administered 2022-01-10: 18 [IU] via SUBCUTANEOUS
  Filled 2022-01-09: qty 0.18

## 2022-01-09 MED ORDER — INSULIN GLARGINE-YFGN 100 UNIT/ML ~~LOC~~ SOLN
3.0000 [IU] | Freq: Once | SUBCUTANEOUS | Status: AC
  Administered 2022-01-09: 3 [IU] via SUBCUTANEOUS
  Filled 2022-01-09: qty 0.03

## 2022-01-09 MED ORDER — SODIUM CHLORIDE 0.9 % IV BOLUS
250.0000 mL | Freq: Once | INTRAVENOUS | Status: AC
  Administered 2022-01-09: 250 mL via INTRAVENOUS

## 2022-01-09 MED ORDER — NITROGLYCERIN 0.2 MG/HR TD PT24
0.2000 mg | MEDICATED_PATCH | Freq: Every day | TRANSDERMAL | Status: DC
  Administered 2022-01-09: 0.2 mg via TRANSDERMAL
  Filled 2022-01-09: qty 1

## 2022-01-09 MED ORDER — POTASSIUM CHLORIDE CRYS ER 20 MEQ PO TBCR
40.0000 meq | EXTENDED_RELEASE_TABLET | Freq: Once | ORAL | Status: AC
  Administered 2022-01-09: 40 meq via ORAL
  Filled 2022-01-09: qty 2

## 2022-01-09 MED ORDER — MORPHINE SULFATE (PF) 2 MG/ML IV SOLN
1.0000 mg | INTRAVENOUS | Status: DC | PRN
  Administered 2022-01-09: 1 mg via INTRAVENOUS
  Filled 2022-01-09: qty 1

## 2022-01-09 MED ORDER — INSULIN ASPART 100 UNIT/ML IJ SOLN
4.0000 [IU] | Freq: Three times a day (TID) | INTRAMUSCULAR | Status: DC
  Administered 2022-01-09 – 2022-01-10 (×3): 4 [IU] via SUBCUTANEOUS

## 2022-01-09 MED ORDER — NITROGLYCERIN 0.3 MG/HR TD PT24
0.3000 mg | MEDICATED_PATCH | Freq: Every day | TRANSDERMAL | Status: DC
  Administered 2022-01-09 – 2022-01-11 (×3): 0.3 mg via TRANSDERMAL
  Filled 2022-01-09 (×3): qty 1

## 2022-01-09 NOTE — Progress Notes (Incomplete)
Pt

## 2022-01-09 NOTE — Progress Notes (Signed)
      Progress Note   Subjective  Patient denies any abdominal pain today. Had chest pain overnight. Renal failure worsening. CABG on hold.    Objective   Vital signs in last 24 hours: Temp:  [97.5 F (36.4 C)-99.3 F (37.4 C)] 98.1 F (36.7 C) (12/15 0357) Pulse Rate:  [73-78] 75 (12/15 0530) Resp:  [18-20] 20 (12/15 0357) BP: (107-138)/(63-74) 116/66 (12/15 0819) SpO2:  [93 %-98 %] 96 % (12/15 0530) Weight:  [119.4 kg] 119.4 kg (12/15 0530) Last BM Date : 01/07/22 General:    white male in NAD Neurologic:  Alert and oriented,  grossly normal neurologically. Psych:  Cooperative. Normal mood and affect.  Intake/Output from previous day: 12/14 0701 - 12/15 0700 In: 4050.4 [P.O.:1080; I.V.:2970.4] Out: 200 [Urine:200] Intake/Output this shift: Total I/O In: 483 [P.O.:477; I.V.:6] Out: -   Lab Results: Recent Labs    01/07/22 1008 01/08/22 0207 01/09/22 0207  WBC 22.0* 23.4* 22.7*  HGB 10.4* 9.6* 9.1*  HCT 29.2* 28.0* 26.0*  PLT 295 255 270   BMET Recent Labs    01/07/22 1509 01/08/22 0207 01/09/22 0207  NA 133* 131* 128*  K 3.6 3.5 3.1*  CL 105 102 94*  CO2 19* 18* 22  GLUCOSE 165* 170* 162*  BUN 40* 41* 51*  CREATININE 2.42* 2.46* 3.01*  CALCIUM 8.2* 8.0* 7.4*   LFT No results for input(s): "PROT", "ALBUMIN", "AST", "ALT", "ALKPHOS", "BILITOT", "BILIDIR", "IBILI" in the last 72 hours. PT/INR No results for input(s): "LABPROT", "INR" in the last 72 hours.  Studies/Results:     Assessment / Plan:    Periumbilical pain NSTEMI / CAD awaiting CABG Aortic ulcer Renal failure   His abdominal pain is resolved today. Had chest pain overnight - renal failure worsening - not good time  for either PCI or CABG due to worsening renal failure. If his abdominal pain recurs recommend repeating imaging with CT, however would not use contrast until renal failure has resolved. Given his abdominal pain has resolved, will sign off for now, call with questions  moving forward or recurrent symptoms.   Jolly Mango, MD [facility] Gastroenterology

## 2022-01-09 NOTE — Progress Notes (Signed)
Rounding Note    Patient Name: Shaun Cummings Date of Encounter: 01/09/2022  [place] Cardiologist: Freada Bergeron, MD   Subjective   No abdominal pain today. Chest pain overnight No dyspnea. No chest pain currently.   Inpatient Medications    Scheduled Meds:  amLODipine  10 mg Oral Daily   aspirin  81 mg Oral Daily   atorvastatin  80 mg Oral Daily   carvedilol  12.5 mg Oral BID WC   Chlorhexidine Gluconate Cloth  6 each Topical Daily   docusate sodium  100 mg Oral BID   hydrALAZINE  75 mg Oral Q8H   insulin aspart  0-15 Units Subcutaneous TID WC   insulin aspart  0-5 Units Subcutaneous QHS   insulin aspart  3 Units Subcutaneous TID WC   insulin glargine-yfgn  15 Units Subcutaneous Daily   mupirocin ointment  1 Application Nasal BID   nitroGLYCERIN  0.3 mg Transdermal Daily   pantoprazole (PROTONIX) IV  40 mg Intravenous Q12H   sodium chloride flush  3 mL Intravenous Q12H   sodium chloride flush  3 mL Intravenous Q12H   Continuous Infusions:  sodium chloride     sodium chloride 10 mL/hr at 01/08/22 2359   diphenhydrAMINE     heparin 2,100 Units/hr (01/08/22 2251)   sodium bicarbonate 150 mEq in sterile water 1,150 mL infusion 75 mL/hr at 01/09/22 0453   PRN Meds: sodium chloride, acetaminophen, acetaminophen, diazepam, diphenhydrAMINE, HYDROmorphone (DILAUDID) injection, morphine injection, naLOXone (NARCAN)  injection, nitroGLYCERIN, ondansetron (ZOFRAN) IV, mouth rinse, polyethylene glycol, sodium chloride flush   Vital Signs    Vitals:   01/09/22 0435 01/09/22 0502 01/09/22 0530 01/09/22 0819  BP: 122/69  138/69 116/66  Pulse:   75   Resp:      Temp:      TempSrc:      SpO2:  98% 96%   Weight:   119.4 kg   Height:        Intake/Output Summary (Last 24 hours) at 01/09/2022 0930 Last data filed at 01/09/2022 0900 Gross per 24 hour  Intake 4239.69 ml  Output 200 ml  Net 4039.69 ml      01/09/2022    5:30 AM 01/08/2022    6:30 AM  01/07/2022    5:00 AM  Last 3 Weights  Weight (lbs) 263 lb 3.7 oz 248 lb 244 lb 14.4 oz  Weight (kg) 119.4 kg 112.492 kg 111.086 kg      Telemetry    Sinus - Personally Reviewed  ECG   Sinus, T wave inversion anterolateral leads, unchanged.   Physical Exam   General: Well developed, well nourished, NAD  HEENT: OP clear, mucus membranes moist  SKIN: warm, dry. No rashes. Neuro: No focal deficits  Musculoskeletal: Muscle strength 5/5 all ext  Psychiatric: Mood and affect normal  Neck: No JVD, no carotid bruits, no thyromegaly, no lymphadenopathy.  Lungs:Clear bilaterally, no wheezes, rhonci, crackles Cardiovascular: Regular rate and rhythm. No murmurs, gallops or rubs. Abdomen:Soft. Bowel sounds present. Non-tender.  Extremities: No lower extremity edema. Pulses are 2 + in the bilateral DP/PT.  Labs    High Sensitivity Troponin:   Recent Labs  Lab 01/04/22 2037 01/04/22 2209 01/05/22 0321 01/05/22 0654 01/05/22 0910  TROPONINIHS 19* 22* 1,719* 4,399* 7,294*     Chemistry Recent Labs  Lab 01/04/22 2037 01/05/22 0321 01/06/22 0440 01/07/22 1509 01/08/22 0207 01/09/22 0207  NA 138 135   < > 133* 131* 128*  K 3.4*  3.4*   < > 3.6 3.5 3.1*  CL 102 100   < > 105 102 94*  CO2 22 23   < > 19* 18* 22  GLUCOSE 242* 277*   < > 165* 170* 162*  BUN 24* 23*   < > 40* 41* 51*  CREATININE 1.90* 1.74*   < > 2.42* 2.46* 3.01*  CALCIUM 9.0 8.9   < > 8.2* 8.0* 7.4*  MG  --  1.8  --   --   --   --   PROT 7.4  --   --   --   --   --   ALBUMIN 3.1*  --   --   --   --   --   AST 17  --   --   --   --   --   ALT 14  --   --   --   --   --   ALKPHOS 81  --   --   --   --   --   BILITOT 0.3  --   --   --   --   --   GFRNONAA 44* 49*   < > 33* 32* 25*  ANIONGAP 14 12   < > 9 11 12    < > = values in this interval not displayed.    Lipids No results for input(s): "CHOL", "TRIG", "HDL", "LABVLDL", "LDLCALC", "CHOLHDL" in the last 168 hours.  Hematology Recent Labs  Lab  01/07/22 1008 01/08/22 0207 01/09/22 0207  WBC 22.0* 23.4* 22.7*  RBC 3.49* 3.28* 3.07*  HGB 10.4* 9.6* 9.1*  HCT 29.2* 28.0* 26.0*  MCV 83.7 85.4 84.7  MCH 29.8 29.3 29.6  MCHC 35.6 34.3 35.0  RDW 13.2 13.3 13.2  PLT 295 255 270   Thyroid No results for input(s): "TSH", "FREET4" in the last 168 hours.  BNPNo results for input(s): "BNP", "PROBNP" in the last 168 hours.  DDimer No results for input(s): "DDIMER" in the last 168 hours.   Radiology    VAS US DOPPLER PRE CABG  Result Date: 01/08/2022 PREOPERATIVE VASCULAR EVALUATION Patient Name:  Shaun Cummings  Date of Exam:   01/08/2022 Medical Rec #: [id number]      Accession #:    [id number] Date of Birth: 20-Apr-1975     Patient Gender: M Patient Age:   46 years Exam Location:  [address] [facility] Procedure:      VAS US DOPPLER PRE CABG Referring Phys: Gilford Raid --------------------------------------------------------------------------------  Indications:      Pre-CABG. Risk Factors:     Hypertension, hyperlipidemia, Diabetes. Comparison Study: No prior studies. Performing Technologist: Carlos Levering RVT  Examination Guidelines: A complete evaluation includes B-mode imaging, spectral Doppler, color Doppler, and power Doppler as needed of all accessible portions of each vessel. Bilateral testing is considered an integral part of a complete examination. Limited examinations for reoccurring indications may be performed as noted.  Right Carotid Findings: +----------+--------+--------+--------+-----------------------+--------+           PSV cm/sEDV cm/sStenosisDescribe               Comments +----------+--------+--------+--------+-----------------------+--------+ CCA Prox  70      14              smooth and heterogenous         +----------+--------+--------+--------+-----------------------+--------+ CCA Distal88      16              smooth and heterogenous          +----------+--------+--------+--------+-----------------------+--------+  ICA Prox  51      18              smooth and heterogenous         +----------+--------+--------+--------+-----------------------+--------+ ICA Distal39      15                                              +----------+--------+--------+--------+-----------------------+--------+ ECA       116     11                                              +----------+--------+--------+--------+-----------------------+--------+ +----------+--------+-------+--------+------------+           PSV cm/sEDV cmsDescribeArm Pressure +----------+--------+-------+--------+------------+ Subclavian123                                 +----------+--------+-------+--------+------------+ +---------+--------+--+--------+-+ VertebralPSV cm/s22EDV cm/s6 +---------+--------+--+--------+-+ Left Carotid Findings: +----------+--------+--------+--------+-----------------------+--------+           PSV cm/sEDV cm/sStenosisDescribe               Comments +----------+--------+--------+--------+-----------------------+--------+ CCA Prox  79      11              smooth and heterogenous         +----------+--------+--------+--------+-----------------------+--------+ CCA Distal83      18              smooth and heterogenous         +----------+--------+--------+--------+-----------------------+--------+ ICA Prox  75      24              smooth and heterogenous         +----------+--------+--------+--------+-----------------------+--------+ ICA Distal63      27                                              +----------+--------+--------+--------+-----------------------+--------+ ECA       122     13                                              +----------+--------+--------+--------+-----------------------+--------+ +----------+--------+--------+--------+------------+ SubclavianPSV cm/sEDV cm/sDescribeArm  Pressure +----------+--------+--------+--------+------------+           123                                  +----------+--------+--------+--------+------------+ +---------+--------+--+--------+--+---------+ VertebralPSV cm/s50EDV cm/s20Antegrade +---------+--------+--+--------+--+---------+  ABI Findings: +--------+------------------+-----+---------+--------+ Right   Rt Pressure (mmHg)IndexWaveform Comment  +--------+------------------+-----+---------+--------+ Brachial101                    triphasic         +--------+------------------+-----+---------+--------+ PTA     111               1.05 triphasic         +--------+------------------+-----+---------+--------+ DP      102  0.96 triphasic         +--------+------------------+-----+---------+--------+ +--------+------------------+-----+---------+-------+ Left    Lt Pressure (mmHg)IndexWaveform Comment +--------+------------------+-----+---------+-------+ [id number]                    triphasic        +--------+------------------+-----+---------+-------+ PTA     103               0.97 triphasic        +--------+------------------+-----+---------+-------+ DP      99                0.93 triphasic        +--------+------------------+-----+---------+-------+ +-------+---------------+----------------+ ABI/TBIToday's ABI/TBIPrevious ABI/TBI +-------+---------------+----------------+ Right  1.05                            +-------+---------------+----------------+ Left   0.97                            +-------+---------------+----------------+  Right Doppler Findings: +--------+--------+-----+---------+--------+ Site    PressureIndexDoppler  Comments +--------+--------+-----+---------+--------+ [id number]          triphasic         +--------+--------+-----+---------+--------+ Radial               triphasic         +--------+--------+-----+---------+--------+  Ulnar                triphasic         +--------+--------+-----+---------+--------+  Left Doppler Findings: +--------+--------+-----+---------+--------+ Site    PressureIndexDoppler  Comments +--------+--------+-----+---------+--------+ [id number]          triphasic         +--------+--------+-----+---------+--------+ Radial               triphasic         +--------+--------+-----+---------+--------+ Ulnar                triphasic         +--------+--------+-----+---------+--------+   Summary: Right Carotid: Velocities in the right ICA are consistent with a 1-39% stenosis. Left Carotid: Velocities in the left ICA are consistent with a 1-39% stenosis. Vertebrals: Bilateral vertebral arteries demonstrate antegrade flow. Right ABI: Resting right ankle-brachial index is within normal range. Left ABI: Resting left ankle-brachial index is within normal range. Right Upper Extremity: Doppler waveforms remain within normal limits with right radial compression. Doppler waveforms decrease >50% with right ulnar compression. Left Upper Extremity: Doppler waveforms remain within normal limits with left radial compression. Doppler waveforms remain within normal limits with left ulnar compression.  Electronically signed by Monica Martinez MD on 01/08/2022 at 4:15:00 PM.    Final    US RENAL  Result Date: 01/07/2022 CLINICAL DATA:  Acute kidney injury with acute tubular necrosis. EXAM: RENAL / URINARY TRACT ULTRASOUND COMPLETE COMPARISON:  Abdominal CTA 01/04/2022 FINDINGS: Right Kidney: Renal measurements: 12.1 x 6.3 x 6.1 cm = volume: 243 mL. Mildly increased parenchymal echogenicity. No hydronephrosis. No visualized stone or focal lesion. Left Kidney: Renal measurements: 12.5 x 7.9 x 5.1 cm = volume: 263 mL. Mildly increased parenchymal echogenicity. No hydronephrosis. Renal calculi on recent CT are not seen by ultrasound. No focal lesion. Bladder: Appears normal for degree of bladder  distention. Other: None. IMPRESSION: 1. Mildly increased renal parenchymal echogenicity suggestive of chronic medical renal disease. 2. No obstructive uropathy. 3. Left renal stones on recent CT are not seen by ultrasound. Electronically Signed  By: Keith Rake M.D.   On: 01/07/2022 18:35   DG Abd 2 Views  Result Date: 01/07/2022 CLINICAL DATA:  Lower abdominal pain EXAM: ABDOMEN - 2 VIEW COMPARISON:  01/04/2022 FINDINGS: The bowel gas pattern is normal. There is no evidence of free air. Moderate volume of stool within the colon. Excreted contrast within the urinary bladder. No radio-opaque calculi or other significant radiographic abnormality is seen. IMPRESSION: Nonobstructive bowel gas pattern. Moderate volume of stool within the colon. Electronically Signed   By: Davina Poke D.O.   On: 01/07/2022 10:59    Cardiac Studies   Cath: 01/06/2022    Dist LAD lesion is 50% stenosed.   Prox LAD lesion is 70% stenosed.   Ost RCA to Prox RCA lesion is 75% stenosed.   Prox RCA lesion is 75% stenosed.   Prox RCA to Mid RCA lesion is 95% stenosed.   Mid RCA lesion is 50% stenosed.   1st Mrg lesion is 50% stenosed.   Mid Cx-2 lesion is 70% stenosed.   2nd Mrg lesion is 95% stenosed.   Mid Cx-1 lesion is 50% stenosed.   Severe multivessel CAD with the dominant RCA being the "culprit" vessel contributing to the non-STEMI with 70% diffuse proximal stenosis followed by 70% and 95% thrombotic mid stenosis and 50% distal stenosis.   There is 70% diffuse proximal LAD stenosis with diffuse 50% distal LAD stenosis.   The left circumflex vessel has diffuse 50% stenosis in the small OM 2 vessel with 95% focal stenosis in the OM 3 vessel and bifurcation AV groove 50% stenosis followed by 70% distal stenosis.   LVEDP elevated at 27 mm.   RECOMMENDATION: Surgical consultation will be recommended for CABG revascularization.  Will resume heparin 8 hours post sheath discontinuance.  Initiate medical  therapy for significant CAD and aggressive lipid-lowering therapy with target LDL less than 55.  Smoking cessation is essential.    Diagnostic Dominance: Right  Echo: 01/05/2022  IMPRESSIONS     1. Left ventricular ejection fraction, by estimation, is 35 to 40%. The  left ventricle has moderately decreased function. The left ventricle  demonstrates regional wall motion abnormalities (see scoring  diagram/findings for description). The left  ventricular internal cavity size was mildly dilated. Left ventricular  diastolic parameters are consistent with Grade I diastolic dysfunction  (impaired relaxation). There is hypokinesis of the left ventricular,  septal wall, anterior wall and lateral wall.   There is severe hypokinesis of the left ventricular, basal-mid inferior  wall.   2. Right ventricular systolic function is normal. The right ventricular  size is normal.   3. Left atrial size was moderately dilated.   4. Right atrial size was mildly dilated.   5. The mitral valve is normal in structure. Trivial mitral valve  regurgitation. No evidence of mitral stenosis.   6. The aortic valve is grossly normal. Aortic valve regurgitation is not  visualized. No aortic stenosis is present.   7. The inferior vena cava is normal in size with greater than 50%  respiratory variability, suggesting right atrial pressure of 3 mmHg.   Patient Profile     46 y.o. male with a hx of HTN, HLD, DM and OSA  admitted with a NSTEMI. Cardiac cath 01/06/22 with severe three vessel CAD, culprit lesion thrombotic lesion in the RCA. CT surgery consulted for CABG. CABG delayed due to worsening renal function post cath.    Assessment & Plan    CAD/NSTEMI: Cardiac cath 01/06/22 with severe  multivessel CAD with dominant RCA being culprit vessel with 70% diffuse proximal stenosis followed by 70 and 95% thrombotic mid stenosis, 70% proximal LAD stenosis with 50% distal LAD stenosis, 50% stenosis in small OM 2, 95%  focal stenosis in OM 3 and bifurcation AV groove 50% stenosis.  Seen by TCTS with recommendations for CABG pending renal function.  Creatinine continues to rise. Nephrology following with plans to continue IV hydration.  -Reviewed CT surgery note from today. Dr. Cyndia Bent noted that CABG will not be a good option if his renal function continues to worsen. PCI is not a good option right now in the setting of daily worsening renal function. An extra contrast load right now would most likely push him towards ESRD. Will follow renal function closely over the weekend and then make a decision next week on CABG vs PCI.  -Continue ASA, statin, Coreg, hydralazine, Norvasc and IV heparin. NTG patch in place.   Ischemic cardiomyopathy/chronic systolic CHF: Echo LVEF of 35-40%, global hypokinesis but more severe in basal-inferior wall. Continue medical therapy with plans for revascularization when renal function returns to baseline.    Hypertensive emergency: BP is well controlled. No changes today.     Penetrating abdominal aortic ulcer: Initially presented with abdominal/back pain.  Mural thrombus of abdominal aorta with small penetrating ulcer in the infrarenal abdominal aorta measuring 3 mm with no mural hematoma. Evaluated by VVS with recommendations for pain management and blood pressure control, repeat scan 4 to 6 weeks as long as no recurrent pain. No abdominal pain today.    AKI in the setting of hypertensive emergency: Creatinine up to 3.0 today. Nephrology following.    DM: Hgb A1c 11.5 11/2021. Per primary team.    Prior tobacco use: reports cessation 3-4 weeks prior to admission.   For questions or updates, please contact [place] Please consult [email or web address] for contact info under    Lauree Chandler, MD, [facility] 01/09/2022 9:30 AM

## 2022-01-09 NOTE — Progress Notes (Signed)
  Pt family member notified this RN of bleeding. Pt assessed. C/o nose bleeding episode. Pt had applied pressure and was monitored until bleeding stopped. Pharmacists notified as relating to Hep gtt. Pt informed to notify staff if bleeding again. Verbalized understanding. Upon reassessment no further bleeding noted.

## 2022-01-09 NOTE — Progress Notes (Addendum)
Called by nursing twice, patient with chest pain, SOB.  Patient treated with sublingual nitro x 2, oxygen and morphine.  Nitropaste placed with second chest pain episode.  Chest pain and SOB resolved.

## 2022-01-09 NOTE — Progress Notes (Signed)
Admit: 01/04/2022 LOS: 5  61M AKI with baseline CKD3 / DKD, NSTEMI with multivessel disease plan for CABG vs PCI; also aortic ulcer  Subjective:  SCr further worsened 3.01, K 3.1, HCO3 22 Family at bedside Not much UOP, 0.2L Neither PCI or CABG planned pendign GFR recovery  12/14 0701 - 12/15 0700 In: 4050.4 [P.O.:1080; I.V.:2970.4] Out: 200 [Urine:200]  Filed Weights   01/07/22 0500 01/08/22 0630 01/09/22 0530  Weight: 111.1 kg 112.5 kg 119.4 kg    Scheduled Meds:  amLODipine  10 mg Oral Daily   aspirin  81 mg Oral Daily   atorvastatin  80 mg Oral Daily   carvedilol  12.5 mg Oral BID WC   Chlorhexidine Gluconate Cloth  6 each Topical Daily   docusate sodium  100 mg Oral BID   hydrALAZINE  75 mg Oral Q8H   insulin aspart  0-15 Units Subcutaneous TID WC   insulin aspart  0-5 Units Subcutaneous QHS   insulin aspart  3 Units Subcutaneous TID WC   insulin glargine-yfgn  15 Units Subcutaneous Daily   mupirocin ointment  1 Application Nasal BID   nitroGLYCERIN  0.3 mg Transdermal Daily   pantoprazole (PROTONIX) IV  40 mg Intravenous Q12H   sodium chloride flush  3 mL Intravenous Q12H   sodium chloride flush  3 mL Intravenous Q12H   Continuous Infusions:  sodium chloride     sodium chloride 10 mL/hr at 01/08/22 2359   diphenhydrAMINE     heparin 2,200 Units/hr (01/09/22 1117)   sodium bicarbonate 150 mEq in sterile water 1,150 mL infusion 75 mL/hr at 01/09/22 1131   PRN Meds:.sodium chloride, acetaminophen, acetaminophen, diazepam, diphenhydrAMINE, HYDROmorphone (DILAUDID) injection, morphine injection, naLOXone (NARCAN)  injection, nitroGLYCERIN, ondansetron (ZOFRAN) IV, mouth rinse, polyethylene glycol, sodium chloride flush  Current Labs: reviewed    Physical Exam:  Blood pressure 116/66, pulse 75, temperature 98.1 F (36.7 C), temperature source Oral, resp. rate 20, height 5\' 11"  (1.803 m), weight 119.4 kg, SpO2 96 %. GEN: NAD ENT: NCAT EYES: EOMI CV: Regular,  normal S1 and S2, no rub PULM: Diminished in the bases, clear ABD: Soft, nontender.  No rebound or guarding. 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; doubt additional GN present Most recent UA without hematuria Renal ultrasound reassuring with findings consistent with medical renal disease Acute insult almost certainly is contrast and acute illness /hypertensive emergency NSTEMI with LHC showing multivessel disease with plan for CABG vs PCI both on hold  Hypertensive emergency at admission, improved on oral agents Penetrating aortic ulcer followed by vascular surgery Abdominal pain, question if related #4, GI following; resolved in time Metabolic acidosis stable Mild hyponatremia, monitor Leukocytosis, stable Normocytic anemia Recent tobacco cessation OSA Hyperlipidemia  P AKI worsening but no RRT needs at this time Cont supportive care, will just need to wait and see how it goes day by day Stop bicarb gtt, orally hydrate from here Daily weights, Daily Renal Panel, Strict I/Os, Avoid nephrotoxins (NSAIDs, judicious IV Contrast)  Medication Issues; Preferred narcotic agents for pain control are hydromorphone, fentanyl, and methadone. Morphine should not be used.  Baclofen should be avoided Avoid oral sodium phosphate and magnesium citrate based laxatives / bowel preps    Pearson Grippe MD 01/09/2022, 12:36 PM  Recent Labs  Lab 01/07/22 1509 01/08/22 0207 01/09/22 0207  NA 133* 131* 128*  K 3.6 3.5 3.1*  CL 105 102 94*  CO2 19* 18* 22  GLUCOSE 165* 170* 162*  BUN 40* 41* 51*  CREATININE 2.42* 2.46* 3.01*  CALCIUM 8.2* 8.0* 7.4*   Recent Labs  Lab 01/07/22 1008 01/08/22 0207 01/09/22 0207  WBC 22.0* 23.4* 22.7*  NEUTROABS 18.9*   --   --   HGB 10.4* 9.6* 9.1*  HCT 29.2* 28.0* 26.0*  MCV 83.7 85.4 84.7  PLT 295 255 270

## 2022-01-09 NOTE — Progress Notes (Signed)
[facility]                                                                              Richards Pherigo, is a 46 y.o. male, DOB - 1975/08/24, KGM:[id number] Admit date - 01/04/2022    Outpatient Primary MD for the patient is Iona Beard, MD  LOS - 5  days  Chief Complaint  Patient presents with   Chest Pain       Brief summary   Patient is a 46 year old male with HTN, HLD, OSA, diabetes mellitus type 2 presented with chest pain.  In ED, BP noted to be 206/111, troponin 19 CTA chest abdomen pelvis showed small penetrating ulcer in the infrarenal abdominal aorta measuring 3 mm, no mural hematoma.  No evidence of aortic aneurysm or dissection.  Creatinine 1.9 Vascular surgery was consulted and recommended medical management.  Patient was placed on esmolol drip and was admitted to ICU by PCCM.  Transferred to the floor and [facility] assumed care on 01/07/2022  Significant Hospital Events:  12/10 presented to [facility]. CTA chest abd pelvis-  CTA chest abdomen pelvis with mall 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, VVS consult, PCCM consult 12/11 Denies pain today. Foot numbness better than a month ago, not worse. Echo with LVEF 35-40% with hypokinesis of the LV septal, anterior, & lateral walls. Severe hypokinesis oif the LV basal mid inferior wall. G1DD. LA moderately dilated, RA mildly dilated. Normal RV function.  12/12 LHC > severe multivessel disease, surgical consultation recommended    Assessment & Plan    Principal Problem:   Hypertensive emergency -Off esmolol drip, on labetalol PRN with parameters -BP now stable, continue Coreg, amlodipine, hydralazine.  Active Problems: Severe multivessel coronary disease, NSTEMI Acute HFrEF -Cardiology was consulted, underwent 2D echo which showed EF of 35 to 40% with hypokinesis of LV septal, anterior and lateral walls.  Severe hypokinesis of the LV basal mid inferior wall, G1 DD. -Underwent  cardiac cath which showed severe multivessel disease, cardiology, TCTS -Plans for any intervention currently on hold due to worsening of the renal function.     AKI (acute kidney injury) ([place]) -Creatinine continues to trend up, 3.01 today -UA negative for UTI, renal ultrasound with no obstructive uropathy.  AKI likely due to contrast nephropathy. -Continue IV fluid hydration, renal following, hopefully will improve.   Penetrating abdominal aortic ulcer -Initially presented with abdominal/back pain, found to have mural thrombus of abdominal aorta with small penetrating ulcer and infrarenal abdominal aorta measuring 3 mm with no medial hematoma -Evaluated by vascular surgery, recommended pain management, BP control and  -Vascular surgery following unable to do CTA due to worsening renal function, abdominal pain has improved  Acute abdominal pain, history of GERD -Resolved, -Lipase 24, recent CT abdomen showed no abdominal pathology except abdominal aortic ulcer -Abdominal x-ray on 12/13 showed nonobstructive bowel gas pattern with moderate volume of stool within the colon, placed on bowel regimen -Abdominal pain has improved, GI was consulted, continue PPI.   Diabetes mellitus type 2, uncontrolled with hyperglycemia Hemoglobin A1c 11.5 -Increase Semglee to 18 units daily, increase NovoLog to  4units TID  AC, SSI  Recent Labs    01/08/22 1252 01/08/22 1503 01/08/22 1558 01/08/22 2100 01/09/22 0805 01/09/22 1226  GLUCAP 174* 159* 228* 189* 175* 268*     OSA -Continue CPAP nightly   GERD -Continue PPI  Obesity  Estimated body mass index is 36.71 kg/m as calculated from the following:   Height as of this encounter: 5\' 11"  (1.803 m).   Weight as of this encounter: 119.4 kg.  Code Status: Full code DVT Prophylaxis:  SCD's Start: 01/06/22 1235 SCDs Start: 01/04/22 2332   Level of Care: Level of care: Progressive Family Communication: Updated patient's wife at the  bedside Disposition Plan:      Remains inpatient appropriate:     Procedures:  Cardiac cath Consultants:   Vascular surgery Was admitted by CCM Cardiac thoracic surgery GI  Antimicrobials:     Medications  amLODipine  10 mg Oral Daily   aspirin  81 mg Oral Daily   atorvastatin  80 mg Oral Daily   carvedilol  12.5 mg Oral BID WC   Chlorhexidine Gluconate Cloth  6 each Topical Daily   docusate sodium  100 mg Oral BID   hydrALAZINE  75 mg Oral Q8H   insulin aspart  0-15 Units Subcutaneous TID WC   insulin aspart  0-5 Units Subcutaneous QHS   insulin aspart  3 Units Subcutaneous TID WC   insulin glargine-yfgn  15 Units Subcutaneous Daily   mupirocin ointment  1 Application Nasal BID   nitroGLYCERIN  0.3 mg Transdermal Daily   pantoprazole (PROTONIX) IV  40 mg Intravenous Q12H   sodium chloride flush  3 mL Intravenous Q12H   sodium chloride flush  3 mL Intravenous Q12H      Subjective:   Shaun Cummings was seen and examined today.  Abdominal pain has improved, no nausea or vomiting.  Wife at the bedside.  He did have chest pain overnight, received nitro and morphine.  Objective:   Vitals:   01/09/22 0502 01/09/22 0530 01/09/22 0819 01/09/22 1442  BP:  138/69 116/66 113/60  Pulse:  75  78  Resp:    17  Temp:    98.9 F (37.2 C)  TempSrc:    Oral  SpO2: 98% 96%  96%  Weight:  119.4 kg    Height:        Intake/Output Summary (Last 24 hours) at 01/09/2022 1449 Last data filed at 01/09/2022 1100 Gross per 24 hour  Intake 3034.11 ml  Output 750 ml  Net 2284.11 ml     Wt Readings from Last 3 Encounters:  01/09/22 119.4 kg  12/30/21 112.3 kg  12/16/21 113.9 kg   Physical Exam General: Somnolent, NAD Cardiovascular: S1 S2 clear, RRR.  Respiratory: CTAB, no wheezing, rales or rhonchi Gastrointestinal: Soft, nontender, nondistended, NBS Ext: no pedal edema bilaterally Neuro: no new deficits Psych: somnolent    Data Reviewed:  I have personally reviewed  following labs    CBC Lab Results  Component Value Date   WBC 22.7 (H) 01/09/2022   RBC 3.07 (L) 01/09/2022   HGB 9.1 (L) 01/09/2022   HCT 26.0 (L) 01/09/2022   MCV 84.7 01/09/2022   MCH 29.6 01/09/2022   PLT 270 01/09/2022   MCHC 35.0 01/09/2022   RDW 13.2 01/09/2022   LYMPHSABS 1.1 01/07/2022   MONOABS 1.8 (H) 01/07/2022   EOSABS 0.0 01/07/2022   BASOSABS 0.0 16/10/9602     Last metabolic panel Lab Results  Component Value Date   NA 128 (  L) 01/09/2022   K 3.1 (L) 01/09/2022   CL 94 (L) 01/09/2022   CO2 22 01/09/2022   BUN 51 (H) 01/09/2022   CREATININE 3.01 (H) 01/09/2022   GLUCOSE 162 (H) 01/09/2022   GFRNONAA 25 (L) 01/09/2022   GFRAA >60 09/08/2019   CALCIUM 7.4 (L) 01/09/2022   PROT 7.4 01/04/2022   ALBUMIN 3.1 (L) 01/04/2022   BILITOT 0.3 01/04/2022   ALKPHOS 81 01/04/2022   AST 17 01/04/2022   ALT 14 01/04/2022   ANIONGAP 12 01/09/2022    CBG (last 3)  Recent Labs    01/08/22 2100 01/09/22 0805 01/09/22 1226  GLUCAP 189* 175* 268*      Coagulation Profile: No results for input(s): "INR", "PROTIME" in the last 168 hours.   Radiology Studies: I have personally reviewed the imaging studies  VAS US DOPPLER PRE CABG  Result Date: 01/08/2022 PREOPERATIVE VASCULAR EVALUATION Patient Name:  EAVEN SCHWAGER  Date of Exam:   01/08/2022 Medical Rec #: [id number]      Accession #:    [id number] Date of Birth: 1975/05/02     Patient Gender: M Patient Age:   48 years Exam Location:  [facility] Procedure:      VAS US DOPPLER PRE CABG Referring Phys: Gilford Raid --------------------------------------------------------------------------------  Indications:      Pre-CABG. Risk Factors:     Hypertension, hyperlipidemia, Diabetes. Comparison Study: No prior studies. Performing Technologist: Carlos Levering RVT  Examination Guidelines: A complete evaluation includes B-mode imaging, spectral Doppler, color Doppler, and power Doppler as needed of all accessible  portions of each vessel. Bilateral testing is considered an integral part of a complete examination. Limited examinations for reoccurring indications may be performed as noted.  Right Carotid Findings: +----------+--------+--------+--------+-----------------------+--------+           PSV cm/sEDV cm/sStenosisDescribe               Comments +----------+--------+--------+--------+-----------------------+--------+ CCA Prox  70      14              smooth and heterogenous         +----------+--------+--------+--------+-----------------------+--------+ CCA Distal88      16              smooth and heterogenous         +----------+--------+--------+--------+-----------------------+--------+ ICA Prox  51      18              smooth and heterogenous         +----------+--------+--------+--------+-----------------------+--------+ ICA Distal39      15                                              +----------+--------+--------+--------+-----------------------+--------+ ECA       116     11                                              +----------+--------+--------+--------+-----------------------+--------+ +----------+--------+-------+--------+------------+           PSV cm/sEDV cmsDescribeArm Pressure +----------+--------+-------+--------+------------+ Subclavian123                                 +----------+--------+-------+--------+------------+ +---------+--------+--+--------+-+ VertebralPSV cm/s22EDV cm/s6 +---------+--------+--+--------+-+  Left Carotid Findings: +----------+--------+--------+--------+-----------------------+--------+           PSV cm/sEDV cm/sStenosisDescribe               Comments +----------+--------+--------+--------+-----------------------+--------+ CCA Prox  79      11              smooth and heterogenous         +----------+--------+--------+--------+-----------------------+--------+ CCA Distal83      18              smooth  and heterogenous         +----------+--------+--------+--------+-----------------------+--------+ ICA Prox  75      24              smooth and heterogenous         +----------+--------+--------+--------+-----------------------+--------+ ICA Distal63      27                                              +----------+--------+--------+--------+-----------------------+--------+ ECA       122     13                                              +----------+--------+--------+--------+-----------------------+--------+ +----------+--------+--------+--------+------------+ SubclavianPSV cm/sEDV cm/sDescribeArm Pressure +----------+--------+--------+--------+------------+           123                                  +----------+--------+--------+--------+------------+ +---------+--------+--+--------+--+---------+ VertebralPSV cm/s50EDV cm/s20Antegrade +---------+--------+--+--------+--+---------+  ABI Findings: +--------+------------------+-----+---------+--------+ Right   Rt Pressure (mmHg)IndexWaveform Comment  +--------+------------------+-----+---------+--------+ Brachial101                    triphasic         +--------+------------------+-----+---------+--------+ PTA     111               1.05 triphasic         +--------+------------------+-----+---------+--------+ DP      102               0.96 triphasic         +--------+------------------+-----+---------+--------+ +--------+------------------+-----+---------+-------+ Left    Lt Pressure (mmHg)IndexWaveform Comment +--------+------------------+-----+---------+-------+ [id number]                    triphasic        +--------+------------------+-----+---------+-------+ PTA     103               0.97 triphasic        +--------+------------------+-----+---------+-------+ DP      99                0.93 triphasic        +--------+------------------+-----+---------+-------+  +-------+---------------+----------------+ ABI/TBIToday's ABI/TBIPrevious ABI/TBI +-------+---------------+----------------+ Right  1.05                            +-------+---------------+----------------+ Left   0.97                            +-------+---------------+----------------+  Right Doppler Findings: +--------+--------+-----+---------+--------+ Site    PressureIndexDoppler  Comments +--------+--------+-----+---------+--------+ [id number]  triphasic         +--------+--------+-----+---------+--------+ Radial               triphasic         +--------+--------+-----+---------+--------+ Ulnar                triphasic         +--------+--------+-----+---------+--------+  Left Doppler Findings: +--------+--------+-----+---------+--------+ Site    PressureIndexDoppler  Comments +--------+--------+-----+---------+--------+ [id number]          triphasic         +--------+--------+-----+---------+--------+ Radial               triphasic         +--------+--------+-----+---------+--------+ Ulnar                triphasic         +--------+--------+-----+---------+--------+   Summary: Right Carotid: Velocities in the right ICA are consistent with a 1-39% stenosis. Left Carotid: Velocities in the left ICA are consistent with a 1-39% stenosis. Vertebrals: Bilateral vertebral arteries demonstrate antegrade flow. Right ABI: Resting right ankle-brachial index is within normal range. Left ABI: Resting left ankle-brachial index is within normal range. Right Upper Extremity: Doppler waveforms remain within normal limits with right radial compression. Doppler waveforms decrease >50% with right ulnar compression. Left Upper Extremity: Doppler waveforms remain within normal limits with left radial compression. Doppler waveforms remain within normal limits with left ulnar compression.  Electronically signed by Monica Martinez MD on 01/08/2022 at 4:15:00 PM.     Final    US RENAL  Result Date: 01/07/2022 CLINICAL DATA:  Acute kidney injury with acute tubular necrosis. EXAM: RENAL / URINARY TRACT ULTRASOUND COMPLETE COMPARISON:  Abdominal CTA 01/04/2022 FINDINGS: Right Kidney: Renal measurements: 12.1 x 6.3 x 6.1 cm = volume: 243 mL. Mildly increased parenchymal echogenicity. No hydronephrosis. No visualized stone or focal lesion. Left Kidney: Renal measurements: 12.5 x 7.9 x 5.1 cm = volume: 263 mL. Mildly increased parenchymal echogenicity. No hydronephrosis. Renal calculi on recent CT are not seen by ultrasound. No focal lesion. Bladder: Appears normal for degree of bladder distention. Other: None. IMPRESSION: 1. Mildly increased renal parenchymal echogenicity suggestive of chronic medical renal disease. 2. No obstructive uropathy. 3. Left renal stones on recent CT are not seen by ultrasound. Electronically Signed   By: Keith Rake M.D.   On: 01/07/2022 18:35       Madason Rauls M.D. [facility] 01/09/2022, 2:49 PM  Available via Epic secure chat 7am-7pm After 7 pm, please refer to night coverage provider listed on amion.

## 2022-01-09 NOTE — Progress Notes (Addendum)
  Progress Note    01/09/2022 9:26 AM 3 Days Post-Op  Subjective:  overnight events noted with substernal chest pain across anterior chest with radiation to back. This morning feeling better but still having a lot of back discomfort mostly. No SOB presently   Vitals:   01/09/22 0530 01/09/22 0819  BP: 138/69 116/66  Pulse: 75   Resp:    Temp:    SpO2: 96%    Physical Exam: Cardiac:  regular Lungs:  non labored Extremities:  well perfused and warm with palpable radial and DP pulses bilaterally Abdomen:  soft, non distended, mild epigastric tenderness Neurologic: alert and oriented  CBC    Component Value Date/Time   WBC 22.7 (H) 01/09/2022 0207   RBC 3.07 (L) 01/09/2022 0207   HGB 9.1 (L) 01/09/2022 0207   HCT 26.0 (L) 01/09/2022 0207   PLT 270 01/09/2022 0207   MCV 84.7 01/09/2022 0207   MCH 29.6 01/09/2022 0207   MCHC 35.0 01/09/2022 0207   RDW 13.2 01/09/2022 0207   LYMPHSABS 1.1 01/07/2022 1008   MONOABS 1.8 (H) 01/07/2022 1008   EOSABS 0.0 01/07/2022 1008   BASOSABS 0.0 01/07/2022 1008    BMET    Component Value Date/Time   NA 128 (L) 01/09/2022 0207   NA 141 04/30/2021 1114   K 3.1 (L) 01/09/2022 0207   CL 94 (L) 01/09/2022 0207   CO2 22 01/09/2022 0207   GLUCOSE 162 (H) 01/09/2022 0207   BUN 51 (H) 01/09/2022 0207   BUN 17 04/30/2021 1114   CREATININE 3.01 (H) 01/09/2022 0207   CREATININE 0.91 09/15/2013 1727   CALCIUM 7.4 (L) 01/09/2022 0207   GFRNONAA 25 (L) 01/09/2022 0207   GFRAA >60 09/08/2019 1328    INR No results found for: "INR"   Intake/Output Summary (Last 24 hours) at 01/09/2022 4481 Last data filed at 01/09/2022 0900 Gross per 24 hour  Intake 4239.69 ml  Output 200 ml  Net 4039.69 ml     Assessment/Plan:  46 y.o. male with penetrating aortic ulcer  Extremities remain well perfused and warm with palpable pulses Having variable pain, initially in abdomen and now chest and back Abdominal pain seems improved with bowel  regimen/ PPI but now with new back pain possible cardiac related Do not think this is related to his PAU Also has severe multivessel CAD in need of CABG or PCI Creatine continues to rise now 3 Again if pain persists may need to consider CT Will continue to follow  DVT prophylaxis:  heparin gtt   Karoline Caldwell, PA-C [facility] [phone] 01/09/2022 9:26 AM  I have independently interviewed and examined patient and agree with PA assessment and plan above.  Symptoms unlikely related to PACU identified on CT scan.  Would not recommend contrast for further evaluation unless absolutely necessary given declining renal function.  Vascular surgery will be available as needed.  Nadina Fomby C. Donzetta Matters, MD [facility] [place] Office: [phone] Pager: [phone]

## 2022-01-09 NOTE — Progress Notes (Signed)
[place] for heparin  Indication: chest pain/ACS  Allergies  Allergen Reactions   Augmentin [Amoxicillin-Pot Clavulanate] Hives and Itching    Has patient had a PCN reaction causing immediate rash, facial/tongue/throat swelling, SOB or lightheadedness with hypotension: Yes Has patient had a PCN reaction causing severe rash involving mucus membranes or skin necrosis: No Has patient had a PCN reaction that required hospitalization: No Has patient had a PCN reaction occurring within the last 10 years: No If all of the above answers are "NO", then may proceed with Cephalosporin use.   Contrast Media [Iodinated Contrast Media] Hives    Pt broke out with 1 hive on forehead after CT injection-treated with 25mg  Benadryl    Patient Measurements: Height: 5\' 11"  (180.3 cm) Weight: 119.4 kg (263 lb 3.7 oz) IBW/kg (Calculated) : 75.3 Heparin Dosing Weight: 99.5 kg  Vital Signs: Temp: 98.1 F (36.7 C) (12/15 0357) Temp Source: Oral (12/15 0357) BP: 116/66 (12/15 0819) Pulse Rate: 75 (12/15 0530)  Labs: Recent Labs    01/07/22 1008 01/07/22 1455 01/07/22 1509 01/08/22 0207 01/08/22 1218 01/09/22 0207  HGB 10.4*  --   --  9.6*  --  9.1*  HCT 29.2*  --   --  28.0*  --  26.0*  PLT 295  --   --  255  --  270  HEPARINUNFRC  --    < >  --  0.42 0.42 0.28*  CREATININE  --   --  2.42* 2.46*  --  3.01*   < > = values in this interval not displayed.     Estimated Creatinine Clearance: 41.2 mL/min (A) (by C-G formula based on SCr of 3.01 mg/dL (H)).   Medical History: Past Medical History:  Diagnosis Date   Chest pain    HTN (hypertension)    Hyperlipidemia    Lesion of penis    foreskin   OSA (obstructive sleep apnea)    per pt dx osa and used cpap but after losing wt. from 415 pounds down to 244 pounds no longer needs cpap   Peripheral neuropathy    Phimosis    Scrotal abscess 05/08/2017   Type 2 diabetes mellitus ([place])    per pt no meds  for two years pt was changed to levemir unable to afford but pt states is waiting on approval for a program he applied for that will pay for his meds ([place])      Assessment: 46 yo M with c/f NSTEMI with elevated troponins and EKG changes. No AC PTA. 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   Heparin level slightly below goal at 0.28 this morning. Nosebleed reported overnight, now resolved, CBC remains stable.  Goal of Therapy:  Heparin level 0.3-0.7 units/ml Monitor platelets by anticoagulation protocol: Yes   Plan:  Increase heparin to 2200 units/h Daily heparin level and CBC  Arrie Senate, PharmD, [place], [facility] Clinical Pharmacist [phone] Please check AMION for all [place] numbers 01/09/2022

## 2022-01-09 NOTE — Progress Notes (Signed)
3 Days Post-Op Procedure(s) (LRB): LEFT HEART CATH AND CORONARY ANGIOGRAPHY (N/A) Subjective:  He had some substernal chest pain a few times overnight relieved with SL NTG. He also had some shortness of breath. ECG did not show any new changes but persistent T-wave changes laterally. No further abdominal pain.   Remains on heparin drip. Had some nose bleeding.   Objective: Vital signs in last 24 hours: Temp:  [97.5 F (36.4 C)-99.3 F (37.4 C)] 98.1 F (36.7 C) (12/15 0357) Pulse Rate:  [73-80] 75 (12/15 0530) Cardiac Rhythm: Normal sinus rhythm (12/14 2030) Resp:  [16-20] 20 (12/15 0357) BP: (107-138)/(63-74) 138/69 (12/15 0530) SpO2:  [92 %-98 %] 96 % (12/15 0530) Weight:  [119.4 kg] 119.4 kg (12/15 0530)  Hemodynamic parameters for last 24 hours:    Intake/Output from previous day: 12/14 0701 - 12/15 0700 In: 4050.4 [P.O.:1080; I.V.:2970.4] Out: 200 [Urine:200] Intake/Output this shift: No intake/output data recorded.  General appearance: alert and cooperative Heart: regular rate and rhythm, S1, S2 normal, no murmur Lungs: clear to auscultation bilaterally  Lab Results: Recent Labs    01/08/22 0207 01/09/22 0207  WBC 23.4* 22.7*  HGB 9.6* 9.1*  HCT 28.0* 26.0*  PLT 255 270   BMET:  Recent Labs    01/08/22 0207 01/09/22 0207  NA 131* 128*  K 3.5 3.1*  CL 102 94*  CO2 18* 22  GLUCOSE 170* 162*  BUN 41* 51*  CREATININE 2.46* 3.01*  CALCIUM 8.0* 7.4*    PT/INR: No results for input(s): "LABPROT", "INR" in the last 72 hours. ABG    Component Value Date/Time   TCO2 25 02/10/2016 1307   CBG (last 3)  Recent Labs    01/08/22 1503 01/08/22 1558 01/08/22 2100  GLUCAP 159* 228* 189*    Assessment/Plan:  This gentleman has severe multivessel CAD with the culprit appearing to be a 95% thrombotic appearing lesion in the mid portion of a large dominant RCA. His other disease is significant but not critical. His creat has risen further to 3.01 this am.  If we proceed with CABG now I think he will end up on dialysis with a long slow recovery given poorly controlled DM and HTN, reduced EF. I was tentatively planning to do CABG early next week if creat came down but I am not sure that is going to happen that quick and with recurrent chest pain it may be best to consider PCI of the culprit RCA now and medical treatment of his other disease to let his kidneys recover, diabetes to get under control and hopefully avoid progression to dialysis. Cardiology will need to consider that when they see him this morning.    LOS: 5 days    Gaye Pollack 01/09/2022

## 2022-01-10 DIAGNOSIS — N179 Acute kidney failure, unspecified: Secondary | ICD-10-CM | POA: Diagnosis not present

## 2022-01-10 DIAGNOSIS — I2510 Atherosclerotic heart disease of native coronary artery without angina pectoris: Secondary | ICD-10-CM | POA: Diagnosis not present

## 2022-01-10 DIAGNOSIS — I719 Aortic aneurysm of unspecified site, without rupture: Secondary | ICD-10-CM | POA: Diagnosis not present

## 2022-01-10 DIAGNOSIS — I739 Peripheral vascular disease, unspecified: Secondary | ICD-10-CM

## 2022-01-10 DIAGNOSIS — I161 Hypertensive emergency: Secondary | ICD-10-CM | POA: Diagnosis not present

## 2022-01-10 DIAGNOSIS — R7989 Other specified abnormal findings of blood chemistry: Secondary | ICD-10-CM | POA: Diagnosis not present

## 2022-01-10 LAB — COMPREHENSIVE METABOLIC PANEL
ALT: 25 U/L (ref 0–44)
AST: 18 U/L (ref 15–41)
Albumin: 2 g/dL — ABNORMAL LOW (ref 3.5–5.0)
Alkaline Phosphatase: 163 U/L — ABNORMAL HIGH (ref 38–126)
Anion gap: 11 (ref 5–15)
BUN: 57 mg/dL — ABNORMAL HIGH (ref 6–20)
CO2: 23 mmol/L (ref 22–32)
Calcium: 7.5 mg/dL — ABNORMAL LOW (ref 8.9–10.3)
Chloride: 94 mmol/L — ABNORMAL LOW (ref 98–111)
Creatinine, Ser: 3.31 mg/dL — ABNORMAL HIGH (ref 0.61–1.24)
GFR, Estimated: 23 mL/min — ABNORMAL LOW (ref >60)
Glucose, Bld: 212 mg/dL — ABNORMAL HIGH (ref 70–99)
Potassium: 3.2 mmol/L — ABNORMAL LOW (ref 3.5–5.1)
Sodium: 128 mmol/L — ABNORMAL LOW (ref 135–145)
Total Bilirubin: 0.7 mg/dL (ref 0.3–1.2)
Total Protein: 5.8 g/dL — ABNORMAL LOW (ref 6.5–8.1)

## 2022-01-10 LAB — GLUCOSE, CAPILLARY
Glucose-Capillary: 151 mg/dL — ABNORMAL HIGH (ref 70–99)
Glucose-Capillary: 161 mg/dL — ABNORMAL HIGH (ref 70–99)
Glucose-Capillary: 199 mg/dL — ABNORMAL HIGH (ref 70–99)
Glucose-Capillary: 107 mg/dL — ABNORMAL HIGH (ref 70–99)
Glucose-Capillary: 108 mg/dL — ABNORMAL HIGH (ref 70–99)
Glucose-Capillary: 122 mg/dL — ABNORMAL HIGH (ref 70–99)

## 2022-01-10 LAB — CBC
HCT: 24.3 % — ABNORMAL LOW (ref 39.0–52.0)
HCT: 28 % — ABNORMAL LOW (ref 39.0–52.0)
Hemoglobin: 8.6 g/dL — ABNORMAL LOW (ref 13.0–17.0)
Hemoglobin: 10.1 g/dL — ABNORMAL LOW (ref 13.0–17.0)
MCH: 30.1 pg (ref 26.0–34.0)
MCH: 30.2 pg (ref 26.0–34.0)
MCHC: 35.4 g/dL (ref 30.0–36.0)
MCHC: 36.1 g/dL — ABNORMAL HIGH (ref 30.0–36.0)
MCV: 85 fL (ref 80.0–100.0)
MCV: 83.8 fL (ref 80.0–100.0)
Platelets: 263 10*3/uL (ref 150–400)
Platelets: 315 10*3/uL (ref 150–400)
RBC: 2.86 MIL/uL — ABNORMAL LOW (ref 4.22–5.81)
RBC: 3.34 MIL/uL — ABNORMAL LOW (ref 4.22–5.81)
RDW: 13.2 % (ref 11.5–15.5)
RDW: 13 % (ref 11.5–15.5)
WBC: 17.8 10*3/uL — ABNORMAL HIGH (ref 4.0–10.5)
WBC: 20 10*3/uL — ABNORMAL HIGH (ref 4.0–10.5)
nRBC: 0 % (ref 0.0–0.2)
nRBC: 0 % (ref 0.0–0.2)

## 2022-01-10 LAB — HEPARIN LEVEL (UNFRACTIONATED)
Heparin Unfractionated: 0.26 IU/mL — ABNORMAL LOW (ref 0.30–0.70)
Heparin Unfractionated: 0.39 IU/mL (ref 0.30–0.70)

## 2022-01-10 MED ORDER — INSULIN GLARGINE-YFGN 100 UNIT/ML ~~LOC~~ SOLN
20.0000 [IU] | Freq: Every day | SUBCUTANEOUS | Status: DC
  Administered 2022-01-11 – 2022-01-16 (×6): 20 [IU] via SUBCUTANEOUS
  Filled 2022-01-10 (×7): qty 0.2

## 2022-01-10 MED ORDER — INSULIN ASPART 100 UNIT/ML IJ SOLN
5.0000 [IU] | Freq: Three times a day (TID) | INTRAMUSCULAR | Status: DC
  Administered 2022-01-11 – 2022-01-16 (×14): 5 [IU] via SUBCUTANEOUS

## 2022-01-10 MED ORDER — OXYCODONE-ACETAMINOPHEN 5-325 MG PO TABS
1.0000 | ORAL_TABLET | Freq: Four times a day (QID) | ORAL | Status: DC | PRN
  Administered 2022-01-10: 2 via ORAL
  Filled 2022-01-10: qty 2

## 2022-01-10 NOTE — Progress Notes (Signed)
Admit: 01/04/2022 LOS: 6  28M AKI with baseline CKD3 / DKD, NSTEMI with multivessel disease plan for CABG vs PCI; also aortic ulcer  Subjective:  UOP picking up SCr worsened 3.3, K 3.2, BUN 57 Family at bedside Neither PCI or CABG planned pendign GFR recovery  12/15 0701 - 12/16 0700 In: 1789.8 [P.O.:1437; I.V.:352.8] Out: 1450 [Urine:1450]  Filed Weights   01/08/22 0630 01/09/22 0530 01/10/22 0334  Weight: 112.5 kg 119.4 kg 117.4 kg    Scheduled Meds:  amLODipine  10 mg Oral Daily   aspirin  81 mg Oral Daily   atorvastatin  80 mg Oral Daily   carvedilol  12.5 mg Oral BID WC   Chlorhexidine Gluconate Cloth  6 each Topical Daily   docusate sodium  100 mg Oral BID   hydrALAZINE  75 mg Oral Q8H   insulin aspart  0-15 Units Subcutaneous TID WC   insulin aspart  0-5 Units Subcutaneous QHS   insulin aspart  4 Units Subcutaneous TID WC   insulin glargine-yfgn  18 Units Subcutaneous Daily   nitroGLYCERIN  0.3 mg Transdermal Daily   pantoprazole (PROTONIX) IV  40 mg Intravenous Q12H   sodium chloride flush  3 mL Intravenous Q12H   sodium chloride flush  3 mL Intravenous Q12H   Continuous Infusions:  sodium chloride     sodium chloride 10 mL/hr at 01/08/22 2359   diphenhydrAMINE     heparin 2,400 Units/hr (01/10/22 0921)   PRN Meds:.sodium chloride, acetaminophen, acetaminophen, diazepam, diphenhydrAMINE, HYDROmorphone (DILAUDID) injection, naLOXone (NARCAN)  injection, nitroGLYCERIN, ondansetron (ZOFRAN) IV, mouth rinse, polyethylene glycol, sodium chloride flush  Current Labs: reviewed    Physical Exam:  Blood pressure (!) 102/51, pulse 74, temperature 98.6 F (37 C), temperature source Oral, resp. rate 20, height 5\' 11"  (1.803 m), weight 117.4 kg, SpO2 99 %. GEN: NAD ENT: NCAT EYES: EOMI CV: Regular, normal S1 and S2, no rub PULM: Diminished in the bases, clear ABD: Soft, nontender.  No rebound or guarding. 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; doubt additional GN present Most recent UA without hematuria Renal ultrasound reassuring with findings consistent with medical renal disease Acute insult almost certainly is contrast and acute illness /hypertensive emergency NSTEMI with LHC showing multivessel disease with plan for CABG vs PCI both on hold  Hypertensive emergency at admission, improved on oral agents Penetrating aortic ulcer followed by vascular surgery Abdominal pain, question if related #4, GI following; resolved in time Metabolic acidosis stable Mild hyponatremia, monitor Leukocytosis, stable Normocytic anemia Recent tobacco cessation OSA Hyperlipidemia  P Maybe seeing recovery of GFR with inc UOP, SCr not yet improved but that tends to lab Can cont oral hydration Cont supportive care, will just need to wait and see how it goes day by day Daily weights, Daily Renal Panel, Strict I/Os, Avoid nephrotoxins (NSAIDs, judicious IV Contrast)  Medication Issues; Preferred narcotic agents for pain control are hydromorphone, fentanyl, and methadone. Morphine should not be used.  Baclofen should be avoided Avoid oral sodium phosphate and magnesium citrate based laxatives / bowel preps    Pearson Grippe MD 01/10/2022, 11:41 AM  Recent Labs  Lab 01/08/22 0207 01/09/22 0207 01/10/22 0949  NA 131* 128* 128*  K 3.5 3.1* 3.2*  CL 102 94* 94*  CO2 18* 22 23  GLUCOSE 170* 162* 212*  BUN 41* 51* 57*  CREATININE 2.46* 3.01* 3.31*  CALCIUM 8.0* 7.4* 7.5*    Recent Labs  Lab 01/07/22 1008 01/08/22 0207 01/09/22 0207 01/10/22 0151 01/10/22 0949  WBC 22.0*   < > 22.7* 17.8* 20.0*  NEUTROABS 18.9*  --   --   --   --   HGB 10.4*   < > 9.1* 8.6* 10.1*  HCT 29.2*   < > 26.0* 24.3* 28.0*   MCV 83.7   < > 84.7 85.0 83.8  PLT 295   < > 270 263 315   < > = values in this interval not displayed.

## 2022-01-10 NOTE — Progress Notes (Signed)
[facility]                                                                              Shaun Cummings, is a 46 y.o. male, DOB - 1975-04-25, NAT:[id number] Admit date - 01/04/2022    Outpatient Primary MD for the patient is Iona Beard, MD  LOS - 6  days  Chief Complaint  Patient presents with   Chest Pain       Brief summary   Patient is a 46 year old male with HTN, HLD, OSA, diabetes mellitus type 2 presented with chest pain.  In ED, BP noted to be 206/111, troponin 19 CTA chest abdomen pelvis showed small penetrating ulcer in the infrarenal abdominal aorta measuring 3 mm, no mural hematoma.  No evidence of aortic aneurysm or dissection.  Creatinine 1.9 Vascular surgery was consulted and recommended medical management.  Patient was placed on esmolol drip and was admitted to ICU by PCCM.  Transferred to the floor and [facility] assumed care on 01/07/2022  Significant Hospital Events:  12/10 presented to [facility]. CTA chest abd pelvis-  CTA chest abdomen pelvis with mall 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, VVS consult, PCCM consult 12/11 Denies pain today. Foot numbness better than a month ago, not worse. Echo with LVEF 35-40% with hypokinesis of the LV septal, anterior, & lateral walls. Severe hypokinesis oif the LV basal mid inferior wall. G1DD. LA moderately dilated, RA mildly dilated. Normal RV function.  12/12 LHC > severe multivessel disease, surgical consultation recommended    Assessment & Plan    Principal Problem:   Hypertensive emergency -Off esmolol drip, on labetalol PRN with parameters -BP well-controlled, continue Coreg, amlodipine, hydralazine   Active Problems: Severe multivessel coronary disease, NSTEMI Acute HFrEF -Cardiology was consulted, underwent 2D echo which showed EF of 35 to 40% with hypokinesis of LV septal, anterior and lateral walls.  Severe hypokinesis of the LV basal mid inferior wall, G1  DD. -Underwent cardiac cath which showed severe multivessel disease, cardiology, TCTS -Plans for any intervention currently on hold due to worsening of the renal function.     AKI (acute kidney injury) ([place]) -Creatinine continues to trend up, 3.01 -> 3.3 today  -UA negative for UTI, renal ultrasound with no obstructive uropathy.  AKI likely due to contrast nephropathy. -UOP improving, nephrology following, recommending to continue hydration, supportive care   Penetrating abdominal aortic ulcer -Initially presented with abdominal/back pain, found to have mural thrombus of abdominal aorta with small penetrating ulcer and infrarenal abdominal aorta measuring 3 mm with no medial hematoma -Evaluated by vascular surgery, recommended pain management, BP control and  -Vascular surgery following unable to do CTA due to worsening renal function, abdominal pain has improved  Acute abdominal pain, history of GERD -Abdominal pain is now resolved -Lipase 24, recent CT abdomen showed no abdominal pathology except abdominal aortic ulcer -Abdominal x-ray on 12/13 showed nonobstructive bowel gas pattern with moderate volume of stool within the colon, placed on bowel regimen -Continue PI   Diabetes mellitus type 2, uncontrolled with hyperglycemia Hemoglobin A1c 11.5 -Continue Semglee 20 units daily, NovoLog 5 units 3 times daily  AC, SSI  Recent Labs    01/09/22 1226 01/09/22 1717 01/09/22 2051 01/10/22 0605 01/10/22 0758 01/10/22 1133  GLUCAP 268* 194* 161* 151* 161* 199*     OSA -Continue CPAP nightly   GERD -Continue PPI  Obesity  Estimated body mass index is 36.1 kg/m as calculated from the following:   Height as of this encounter: 5\' 11"  (1.803 m).   Weight as of this encounter: 117.4 kg.  Code Status: Full code DVT Prophylaxis:  SCD's Start: 01/06/22 1235 SCDs Start: 01/04/22 2332   Level of Care: Level of care: Progressive Family Communication: Updated patient's wife at  the bedside today Disposition Plan:      Remains inpatient appropriate:     Procedures:  Cardiac cath Consultants:   Vascular surgery Was admitted by CCM Cardiac thoracic surgery GI  Antimicrobials:     Medications  amLODipine  10 mg Oral Daily   aspirin  81 mg Oral Daily   atorvastatin  80 mg Oral Daily   carvedilol  12.5 mg Oral BID WC   Chlorhexidine Gluconate Cloth  6 each Topical Daily   docusate sodium  100 mg Oral BID   hydrALAZINE  75 mg Oral Q8H   insulin aspart  0-15 Units Subcutaneous TID WC   insulin aspart  0-5 Units Subcutaneous QHS   insulin aspart  4 Units Subcutaneous TID WC   insulin glargine-yfgn  18 Units Subcutaneous Daily   nitroGLYCERIN  0.3 mg Transdermal Daily   pantoprazole (PROTONIX) IV  40 mg Intravenous Q12H   sodium chloride flush  3 mL Intravenous Q12H   sodium chloride flush  3 mL Intravenous Q12H      Subjective:   Shaun Cummings was seen and examined today.  Abdominal pain has resolved.  No nausea vomiting or any diarrhea.  Urine output has been improving.  Wife at the bedside.  No chest pain or acute shortness of breath. Objective:   Vitals:   01/10/22 0602 01/10/22 0759 01/10/22 0921 01/10/22 1135  BP: 113/60 138/78  (!) 102/51  Pulse:  79 78 74  Resp:    20  Temp:  98.6 F (37 C)  98.6 F (37 C)  TempSrc:  Oral  Oral  SpO2:  96%  99%  Weight:      Height:        Intake/Output Summary (Last 24 hours) at 01/10/2022 1245 Last data filed at 01/10/2022 0200 Gross per 24 hour  Intake 1066.76 ml  Output 900 ml  Net 166.76 ml     Wt Readings from Last 3 Encounters:  01/10/22 117.4 kg  12/30/21 112.3 kg  12/16/21 113.9 kg    Physical Exam General: Alert and oriented x 3, NAD Cardiovascular: S1 S2 clear, RRR.  Respiratory: CTAB, no wheezing Gastrointestinal: Soft, nontender, nondistended, NBS Ext: no pedal edema bilaterally Neuro: no new deficits Psych: Normal affect     Data Reviewed:  I have personally  reviewed following labs    CBC Lab Results  Component Value Date   WBC 20.0 (H) 01/10/2022   RBC 3.34 (L) 01/10/2022   HGB 10.1 (L) 01/10/2022   HCT 28.0 (L) 01/10/2022   MCV 83.8 01/10/2022   MCH 30.2 01/10/2022   PLT 315 01/10/2022   MCHC 36.1 (H) 01/10/2022   RDW 13.0 01/10/2022   LYMPHSABS 1.1 01/07/2022   MONOABS 1.8 (H) 01/07/2022   EOSABS 0.0 01/07/2022   BASOSABS 0.0 61/44/3154     Last metabolic panel Lab Results  Component  Value Date   NA 128 (L) 01/10/2022   K 3.2 (L) 01/10/2022   CL 94 (L) 01/10/2022   CO2 23 01/10/2022   BUN 57 (H) 01/10/2022   CREATININE 3.31 (H) 01/10/2022   GLUCOSE 212 (H) 01/10/2022   GFRNONAA 23 (L) 01/10/2022   GFRAA >60 09/08/2019   CALCIUM 7.5 (L) 01/10/2022   PROT 5.8 (L) 01/10/2022   ALBUMIN 2.0 (L) 01/10/2022   BILITOT 0.7 01/10/2022   ALKPHOS 163 (H) 01/10/2022   AST 18 01/10/2022   ALT 25 01/10/2022   ANIONGAP 11 01/10/2022    CBG (last 3)  Recent Labs    01/10/22 0605 01/10/22 0758 01/10/22 1133  GLUCAP 151* 161* 199*      Coagulation Profile: No results for input(s): "INR", "PROTIME" in the last 168 hours.   Radiology Studies: I have personally reviewed the imaging studies  VAS US DOPPLER PRE CABG  Result Date: 01/08/2022 PREOPERATIVE VASCULAR EVALUATION Patient Name:  ZIDANE RENNER  Date of Exam:   01/08/2022 Medical Rec #: [id number]      Accession #:    [id number] Date of Birth: 08-14-1975     Patient Gender: M Patient Age:   74 years Exam Location:  [facility] Procedure:      VAS US DOPPLER PRE CABG Referring Phys: Gilford Raid --------------------------------------------------------------------------------  Indications:      Pre-CABG. Risk Factors:     Hypertension, hyperlipidemia, Diabetes. Comparison Study: No prior studies. Performing Technologist: Carlos Levering RVT  Examination Guidelines: A complete evaluation includes B-mode imaging, spectral Doppler, color Doppler, and power Doppler as  needed of all accessible portions of each vessel. Bilateral testing is considered an integral part of a complete examination. Limited examinations for reoccurring indications may be performed as noted.  Right Carotid Findings: +----------+--------+--------+--------+-----------------------+--------+           PSV cm/sEDV cm/sStenosisDescribe               Comments +----------+--------+--------+--------+-----------------------+--------+ CCA Prox  70      14              smooth and heterogenous         +----------+--------+--------+--------+-----------------------+--------+ CCA Distal88      16              smooth and heterogenous         +----------+--------+--------+--------+-----------------------+--------+ ICA Prox  51      18              smooth and heterogenous         +----------+--------+--------+--------+-----------------------+--------+ ICA Distal39      15                                              +----------+--------+--------+--------+-----------------------+--------+ ECA       116     11                                              +----------+--------+--------+--------+-----------------------+--------+ +----------+--------+-------+--------+------------+           PSV cm/sEDV cmsDescribeArm Pressure +----------+--------+-------+--------+------------+ Subclavian123                                 +----------+--------+-------+--------+------------+ +---------+--------+--+--------+-+  VertebralPSV cm/s22EDV cm/s6 +---------+--------+--+--------+-+ Left Carotid Findings: +----------+--------+--------+--------+-----------------------+--------+           PSV cm/sEDV cm/sStenosisDescribe               Comments +----------+--------+--------+--------+-----------------------+--------+ CCA Prox  79      11              smooth and heterogenous         +----------+--------+--------+--------+-----------------------+--------+ CCA Distal83       18              smooth and heterogenous         +----------+--------+--------+--------+-----------------------+--------+ ICA Prox  75      24              smooth and heterogenous         +----------+--------+--------+--------+-----------------------+--------+ ICA Distal63      27                                              +----------+--------+--------+--------+-----------------------+--------+ ECA       122     13                                              +----------+--------+--------+--------+-----------------------+--------+ +----------+--------+--------+--------+------------+ SubclavianPSV cm/sEDV cm/sDescribeArm Pressure +----------+--------+--------+--------+------------+           123                                  +----------+--------+--------+--------+------------+ +---------+--------+--+--------+--+---------+ VertebralPSV cm/s50EDV cm/s20Antegrade +---------+--------+--+--------+--+---------+  ABI Findings: +--------+------------------+-----+---------+--------+ Right   Rt Pressure (mmHg)IndexWaveform Comment  +--------+------------------+-----+---------+--------+ Brachial101                    triphasic         +--------+------------------+-----+---------+--------+ PTA     111               1.05 triphasic         +--------+------------------+-----+---------+--------+ DP      102               0.96 triphasic         +--------+------------------+-----+---------+--------+ +--------+------------------+-----+---------+-------+ Left    Lt Pressure (mmHg)IndexWaveform Comment +--------+------------------+-----+---------+-------+ [id number]                    triphasic        +--------+------------------+-----+---------+-------+ PTA     103               0.97 triphasic        +--------+------------------+-----+---------+-------+ DP      99                0.93 triphasic         +--------+------------------+-----+---------+-------+ +-------+---------------+----------------+ ABI/TBIToday's ABI/TBIPrevious ABI/TBI +-------+---------------+----------------+ Right  1.05                            +-------+---------------+----------------+ Left   0.97                            +-------+---------------+----------------+  Right Doppler Findings: +--------+--------+-----+---------+--------+ Site    PressureIndexDoppler  Comments +--------+--------+-----+---------+--------+ [id number]  triphasic         +--------+--------+-----+---------+--------+ Radial               triphasic         +--------+--------+-----+---------+--------+ Ulnar                triphasic         +--------+--------+-----+---------+--------+  Left Doppler Findings: +--------+--------+-----+---------+--------+ Site    PressureIndexDoppler  Comments +--------+--------+-----+---------+--------+ [id number]          triphasic         +--------+--------+-----+---------+--------+ Radial               triphasic         +--------+--------+-----+---------+--------+ Ulnar                triphasic         +--------+--------+-----+---------+--------+   Summary: Right Carotid: Velocities in the right ICA are consistent with a 1-39% stenosis. Left Carotid: Velocities in the left ICA are consistent with a 1-39% stenosis. Vertebrals: Bilateral vertebral arteries demonstrate antegrade flow. Right ABI: Resting right ankle-brachial index is within normal range. Left ABI: Resting left ankle-brachial index is within normal range. Right Upper Extremity: Doppler waveforms remain within normal limits with right radial compression. Doppler waveforms decrease >50% with right ulnar compression. Left Upper Extremity: Doppler waveforms remain within normal limits with left radial compression. Doppler waveforms remain within normal limits with left ulnar compression.  Electronically signed  by Monica Martinez MD on 01/08/2022 at 4:15:00 PM.    Final        Estill Cotta M.D. [facility] 01/10/2022, 12:45 PM  Available via Epic secure chat 7am-7pm After 7 pm, please refer to night coverage provider listed on amion.

## 2022-01-10 NOTE — Progress Notes (Signed)
Pt had complained of being weak and sweaty. He said he feels very hot. BP checked  109/62. HR = 60  ; Temp 79.4 F.  CBG was also rechecked at 107. EKG was completed per protocol. Dr, Tana Coast had been updated of above. Pt also had received 2 tablets of percocet. Given an hour earlier. Pt back problem resolved. Pt stated he feels better now, no more sweating nor c/o of being hot. . Will continue to monitor pt.

## 2022-01-10 NOTE — Progress Notes (Signed)
Patient is having chest pain 7/10 and states it is hard to breathe. One nitro given and EKG is done and 2L [employer] given. Patient saturation is 96% on RA. The day shift nurse says that he has been weak all day and pretty sleepy all day. He is very drowsy and sleepy for me. It is hard to assess his pain level now with him being so sleepy. BP dropped from 112/67 to 98/54 (67) after nitro after one time. Blood sugar is 161. Temperature 97.6 orally. No pain medication given all day. Crosley MD notified. Labs ordered. Patient is resting with eyes closed. Wife at the bedside.Will continue to monitor.

## 2022-01-10 NOTE — Evaluation (Signed)
Physical Therapy Evaluation and Discharge Patient Details Name: Shaun Cummings MRN: [id number] DOB: 12-Jun-1975 Today's Date: 01/10/2022  History of Present Illness  Pt is a 46 y.o. M who presents 01/04/2022 with chest pain. CTA chest abdomen pelvis with small 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 Vascular consulted and recommended medical management. 12/12 LHC showing severe multivessel disease, surgical consultation recommended. Significant PMH: HTN, HLD, OSA, DM2.  Clinical Impression  Patient evaluated by Physical Therapy with no further acute PT needs identified. Pt reports fatigue, but denies chest pain or nausea. Pt ambulating 400 ft with no assistive device at a supervision level. HR 80-85 bpm, SpO2 96-98% on RA. Demonstrates mild decreased cardiopulmonary endurance and balance deficits. Recommended ambulating with pt family vs staff 3x/day while inpatient. All education has been completed and the patient has no further questions. No follow-up Physical Therapy or equipment needs. PT is signing off. Thank you for this referral.      Recommendations for follow up therapy are one component of a multi-disciplinary discharge planning process, led by the attending physician.  Recommendations may be updated based on patient status, additional functional criteria and insurance authorization.  Follow Up Recommendations No PT follow up      Assistance Recommended at Discharge PRN  Patient can return home with the following  Assistance with cooking/housework;Assist for transportation;Help with stairs or ramp for entrance    Equipment Recommendations None recommended by PT  Recommendations for Other Services       Functional Status Assessment Patient has had a recent decline in their functional status and demonstrates the ability to make significant improvements in function in a reasonable and predictable amount of time.     Precautions / Restrictions  Precautions Precautions: None Restrictions Weight Bearing Restrictions: No      Mobility  Bed Mobility Overal bed mobility: Independent                  Transfers Overall transfer level: Modified independent                 General transfer comment: rocking to gain momentum, increased time    Ambulation/Gait Ambulation/Gait assistance: Supervision Gait Distance (Feet): 400 Feet Assistive device: None Gait Pattern/deviations: Step-through pattern, Decreased stride length Gait velocity: decreased     General Gait Details: Pt with one mild posterior LOB, but able to self correct  Stairs            Wheelchair Mobility    Modified Rankin (Stroke Patients Only)       Balance Overall balance assessment: Mild deficits observed, not formally tested                                           Pertinent Vitals/Pain Pain Assessment Pain Assessment: No/denies pain    Home Living Family/patient expects to be discharged to:: Private residence Living Arrangements: Spouse/significant other;Children (spouse, son, daughter 2665, 60 y.o.), mother) Available Help at Discharge: Family Type of Home: House Home Access: Stairs to enter   Technical brewer of Steps: 1   Home Layout: One level        Prior Function Prior Level of Function : Independent/Modified Independent             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, newspaper  Extremity/Trunk Assessment   Upper Extremity Assessment Upper Extremity Assessment: Overall WFL for tasks assessed    Lower Extremity Assessment Lower Extremity Assessment: Overall WFL for tasks assessed    Cervical / Trunk Assessment Cervical / Trunk Assessment: Normal  Communication   Communication: No difficulties  Cognition Arousal/Alertness: Awake/alert Behavior During Therapy: WFL for tasks assessed/performed Overall Cognitive Status: Within Functional  Limits for tasks assessed                                          General Comments      Exercises     Assessment/Plan    PT Assessment Patient does not need any further PT services  PT Problem List         PT Treatment Interventions      PT Goals (Current goals can be found in the Care Plan section)  Acute Rehab PT Goals Patient Stated Goal: to improve energy level PT Goal Formulation: All assessment and education complete, DC therapy    Frequency       Co-evaluation               AM-PAC PT "6 Clicks" Mobility  Outcome Measure Help needed turning from your back to your side while in a flat bed without using bedrails?: None Help needed moving from lying on your back to sitting on the side of a flat bed without using bedrails?: None Help needed moving to and from a bed to a chair (including a wheelchair)?: None Help needed standing up from a chair using your arms (e.g., wheelchair or bedside chair)?: None Help needed to walk in hospital room?: A Little Help needed climbing 3-5 steps with a railing? : A Little 6 Click Score: 22    End of Session   Activity Tolerance: Patient tolerated treatment well Patient left: in bed;with call bell/phone within reach;with family/visitor present Nurse Communication: Mobility status PT Visit Diagnosis: Unsteadiness on feet (R26.81)    Time: 9390-3009 PT Time Calculation (min) (ACUTE ONLY): 26 min   Charges:   PT Evaluation $PT Eval Low Complexity: 1 Low PT Treatments $Therapeutic Activity: 8-22 mins        [name], PT, DPT Acute Rehabilitation Services Office [phone]   [name] 01/10/2022, 1:44 PM

## 2022-01-10 NOTE — Progress Notes (Signed)
ANTICOAGULATION CONSULT NOTE-Follow Up  Pharmacy Consult for heparin  Indication: chest pain/ACS  Allergies  Allergen Reactions   Augmentin [Amoxicillin-Pot Clavulanate] Hives and Itching    Has patient had a PCN reaction causing immediate rash, facial/tongue/throat swelling, SOB or lightheadedness with hypotension: Yes Has patient had a PCN reaction causing severe rash involving mucus membranes or skin necrosis: No Has patient had a PCN reaction that required hospitalization: No Has patient had a PCN reaction occurring within the last 10 years: No If all of the above answers are "NO", then may proceed with Cephalosporin use.   Contrast Media [Iodinated Contrast Media] Hives    Pt broke out with 1 hive on forehead after CT injection-treated with 25mg  Benadryl    Patient Measurements: Height: 5\' 11"  (180.3 cm) Weight: 117.4 kg (258 lb 12.8 oz) IBW/kg (Calculated) : 75.3 Heparin Dosing Weight: 99.5 kg  Vital Signs: Temp: 97.9 F (36.6 C) (12/16 0339) Temp Source: Oral (12/16 0339) BP: 113/60 (12/16 0602) Pulse Rate: 70 (12/16 0339)  Labs: Recent Labs    01/07/22 1509 01/08/22 0207 01/08/22 1218 01/09/22 0207 01/10/22 0151  HGB  --  9.6*  --  9.1* 8.6*  HCT  --  28.0*  --  26.0* 24.3*  PLT  --  255  --  270 263  HEPARINUNFRC  --  0.42 0.42 0.28* 0.26*  CREATININE 2.42* 2.46*  --  3.01*  --      Estimated Creatinine Clearance: 40.8 mL/min (A) (by C-G formula based on SCr of 3.01 mg/dL (H)).   Medical History: Past Medical History:  Diagnosis Date   Chest pain    HTN (hypertension)    Hyperlipidemia    Lesion of penis    foreskin   OSA (obstructive sleep apnea)    per pt dx osa and used cpap but after losing wt. from 415 pounds down to 244 pounds no longer needs cpap   Peripheral neuropathy    Phimosis    Scrotal abscess 05/08/2017   Type 2 diabetes mellitus ([place])    per pt no meds for two years pt was changed to levemir unable to afford but pt states is  waiting on approval for a program he applied for that will pay for his meds ([place])      Assessment: 46 yo M with c/f NSTEMI with elevated troponins and EKG changes. No AC PTA. 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   Heparin level continues to drop to 0.26 from 0.28. Drop in Hgb from 9.1 to 8.6, plts WNL. No issues with running the heparin gtt and no signs/symptoms of bleeding per RN.   Goal of Therapy:  Heparin level 0.3-0.7 units/ml Monitor platelets by anticoagulation protocol: Yes   Plan:  Increase heparin to 2400 units/h Daily heparin level and CBC Monitor for signs/sym  Sandford Craze, PharmD. [facility] Acute Care PGY-1 01/10/2022 7:12 AM

## 2022-01-10 NOTE — Progress Notes (Signed)
Progress Note  Patient Name: Shaun Cummings Date of Encounter: 01/10/2022  Primary Cardiologist: Freada Bergeron, MD  Subjective   No acute events overnight.  Blood pressure very well-controlled. No chest pain but complains of back pain probably from laying on the bed for long time.  He was getting out off the bed into the chair 1 hour daily.  No other issues.  Inpatient Medications    Scheduled Meds:  amLODipine  10 mg Oral Daily   aspirin  81 mg Oral Daily   atorvastatin  80 mg Oral Daily   carvedilol  12.5 mg Oral BID WC   Chlorhexidine Gluconate Cloth  6 each Topical Daily   docusate sodium  100 mg Oral BID   hydrALAZINE  75 mg Oral Q8H   insulin aspart  0-15 Units Subcutaneous TID WC   insulin aspart  0-5 Units Subcutaneous QHS   insulin aspart  4 Units Subcutaneous TID WC   insulin glargine-yfgn  18 Units Subcutaneous Daily   nitroGLYCERIN  0.3 mg Transdermal Daily   pantoprazole (PROTONIX) IV  40 mg Intravenous Q12H   sodium chloride flush  3 mL Intravenous Q12H   sodium chloride flush  3 mL Intravenous Q12H   Continuous Infusions:  sodium chloride     sodium chloride 10 mL/hr at 01/08/22 2359   diphenhydrAMINE     heparin 2,400 Units/hr (01/10/22 0921)   PRN Meds: sodium chloride, acetaminophen, acetaminophen, diazepam, diphenhydrAMINE, HYDROmorphone (DILAUDID) injection, naLOXone (NARCAN)  injection, nitroGLYCERIN, ondansetron (ZOFRAN) IV, mouth rinse, polyethylene glycol, sodium chloride flush   Vital Signs    Vitals:   01/10/22 0339 01/10/22 0602 01/10/22 0759 01/10/22 0921  BP: 120/73 113/60 138/78   Pulse: 70  79 78  Resp: 18     Temp: 97.9 F (36.6 C)  98.6 F (37 C)   TempSrc: Oral  Oral   SpO2: 93%  96%   Weight:      Height:        Intake/Output Summary (Last 24 hours) at 01/10/2022 1109 Last data filed at 01/10/2022 0200 Gross per 24 hour  Intake 1306.76 ml  Output 900 ml  Net 406.76 ml   Filed Weights   01/08/22 0630 01/09/22  0530 01/10/22 0334  Weight: 112.5 kg 119.4 kg 117.4 kg    Telemetry     Personally reviewed, NSR  ECG    An ECG dated 01/09/22 was personally reviewed today and demonstrated:  NSR, TWI in lateral leads  Physical Exam   GEN: No acute distress.   Neck: No JVD. Cardiac: RRR, no murmur, rub, or gallop.  Respiratory: Nonlabored. Clear to auscultation bilaterally. GI: Soft, nontender, bowel sounds present. MS: No edema; No deformity. Neuro:  Nonfocal. Psych: Alert and oriented x 3. Normal affect.  Labs    Chemistry Recent Labs  Lab 01/04/22 2037 01/05/22 0321 01/08/22 0207 01/09/22 0207 01/10/22 0949  NA 138   < > 131* 128* 128*  K 3.4*   < > 3.5 3.1* 3.2*  CL 102   < > 102 94* 94*  CO2 22   < > 18* 22 23  GLUCOSE 242*   < > 170* 162* 212*  BUN 24*   < > 41* 51* 57*  CREATININE 1.90*   < > 2.46* 3.01* 3.31*  CALCIUM 9.0   < > 8.0* 7.4* 7.5*  PROT 7.4  --   --   --  5.8*  ALBUMIN 3.1*  --   --   --  2.0*  AST 17  --   --   --  18  ALT 14  --   --   --  25  ALKPHOS 81  --   --   --  163*  BILITOT 0.3  --   --   --  0.7  GFRNONAA 44*   < > 32* 25* 23*  ANIONGAP 14   < > 11 12 11    < > = values in this interval not displayed.     Hematology Recent Labs  Lab 01/09/22 0207 01/10/22 0151 01/10/22 0949  WBC 22.7* 17.8* 20.0*  RBC 3.07* 2.86* 3.34*  HGB 9.1* 8.6* 10.1*  HCT 26.0* 24.3* 28.0*  MCV 84.7 85.0 83.8  MCH 29.6 30.1 30.2  MCHC 35.0 35.4 36.1*  RDW 13.2 13.2 13.0  PLT 270 263 315    Cardiac Enzymes Recent Labs  Lab 01/04/22 2037 01/04/22 2209 01/05/22 0321 01/05/22 0654 01/05/22 0910  TROPONINIHS 19* 22* 1,719* 4,399* 7,294*    BNPNo results for input(s): "BNP", "PROBNP" in the last 168 hours.   DDimerNo results for input(s): "DDIMER" in the last 168 hours.   Radiology    VAS US DOPPLER PRE CABG  Result Date: 01/08/2022 PREOPERATIVE VASCULAR EVALUATION Patient Name:  ALYAAN BUDZYNSKI  Date of Exam:   01/08/2022 Medical Rec #: [id number]       Accession #:    [id number] Date of Birth: 20-Jul-1975     Patient Gender: M Patient Age:   30 years Exam Location:  [facility] Procedure:      VAS US DOPPLER PRE CABG Referring Phys: Gilford Raid --------------------------------------------------------------------------------  Indications:      Pre-CABG. Risk Factors:     Hypertension, hyperlipidemia, Diabetes. Comparison Study: No prior studies. Performing Technologist: Carlos Levering RVT  Examination Guidelines: A complete evaluation includes B-mode imaging, spectral Doppler, color Doppler, and power Doppler as needed of all accessible portions of each vessel. Bilateral testing is considered an integral part of a complete examination. Limited examinations for reoccurring indications may be performed as noted.  Right Carotid Findings: +----------+--------+--------+--------+-----------------------+--------+           PSV cm/sEDV cm/sStenosisDescribe               Comments +----------+--------+--------+--------+-----------------------+--------+ CCA Prox  70      14              smooth and heterogenous         +----------+--------+--------+--------+-----------------------+--------+ CCA Distal88      16              smooth and heterogenous         +----------+--------+--------+--------+-----------------------+--------+ ICA Prox  51      18              smooth and heterogenous         +----------+--------+--------+--------+-----------------------+--------+ ICA Distal39      15                                              +----------+--------+--------+--------+-----------------------+--------+ ECA       116     11                                              +----------+--------+--------+--------+-----------------------+--------+ +----------+--------+-------+--------+------------+  PSV cm/sEDV cmsDescribeArm Pressure +----------+--------+-------+--------+------------+ Subclavian123                                  +----------+--------+-------+--------+------------+ +---------+--------+--+--------+-+ VertebralPSV cm/s22EDV cm/s6 +---------+--------+--+--------+-+ Left Carotid Findings: +----------+--------+--------+--------+-----------------------+--------+           PSV cm/sEDV cm/sStenosisDescribe               Comments +----------+--------+--------+--------+-----------------------+--------+ CCA Prox  79      11              smooth and heterogenous         +----------+--------+--------+--------+-----------------------+--------+ CCA Distal83      18              smooth and heterogenous         +----------+--------+--------+--------+-----------------------+--------+ ICA Prox  75      24              smooth and heterogenous         +----------+--------+--------+--------+-----------------------+--------+ ICA Distal63      27                                              +----------+--------+--------+--------+-----------------------+--------+ ECA       122     13                                              +----------+--------+--------+--------+-----------------------+--------+ +----------+--------+--------+--------+------------+ SubclavianPSV cm/sEDV cm/sDescribeArm Pressure +----------+--------+--------+--------+------------+           123                                  +----------+--------+--------+--------+------------+ +---------+--------+--+--------+--+---------+ VertebralPSV cm/s50EDV cm/s20Antegrade +---------+--------+--+--------+--+---------+  ABI Findings: +--------+------------------+-----+---------+--------+ Right   Rt Pressure (mmHg)IndexWaveform Comment  +--------+------------------+-----+---------+--------+ Brachial101                    triphasic         +--------+------------------+-----+---------+--------+ PTA     111               1.05 triphasic         +--------+------------------+-----+---------+--------+  DP      102               0.96 triphasic         +--------+------------------+-----+---------+--------+ +--------+------------------+-----+---------+-------+ Left    Lt Pressure (mmHg)IndexWaveform Comment +--------+------------------+-----+---------+-------+ [id number]                    triphasic        +--------+------------------+-----+---------+-------+ PTA     103               0.97 triphasic        +--------+------------------+-----+---------+-------+ DP      99                0.93 triphasic        +--------+------------------+-----+---------+-------+ +-------+---------------+----------------+ ABI/TBIToday's ABI/TBIPrevious ABI/TBI +-------+---------------+----------------+ Right  1.05                            +-------+---------------+----------------+ Left   0.97                            +-------+---------------+----------------+  Right Doppler Findings: +--------+--------+-----+---------+--------+ Site    PressureIndexDoppler  Comments +--------+--------+-----+---------+--------+ [id number]          triphasic         +--------+--------+-----+---------+--------+ Radial               triphasic         +--------+--------+-----+---------+--------+ Ulnar                triphasic         +--------+--------+-----+---------+--------+  Left Doppler Findings: +--------+--------+-----+---------+--------+ Site    PressureIndexDoppler  Comments +--------+--------+-----+---------+--------+ [id number]          triphasic         +--------+--------+-----+---------+--------+ Radial               triphasic         +--------+--------+-----+---------+--------+ Ulnar                triphasic         +--------+--------+-----+---------+--------+   Summary: Right Carotid: Velocities in the right ICA are consistent with a 1-39% stenosis. Left Carotid: Velocities in the left ICA are consistent with a 1-39% stenosis. Vertebrals: Bilateral  vertebral arteries demonstrate antegrade flow. Right ABI: Resting right ankle-brachial index is within normal range. Left ABI: Resting left ankle-brachial index is within normal range. Right Upper Extremity: Doppler waveforms remain within normal limits with right radial compression. Doppler waveforms decrease >50% with right ulnar compression. Left Upper Extremity: Doppler waveforms remain within normal limits with left radial compression. Doppler waveforms remain within normal limits with left ulnar compression.  Electronically signed by Monica Martinez MD on 01/08/2022 at 4:15:00 PM.    Final     Echo from 01/05/2022 LVEF 35 to 40% with RWMA RV systolic function is normal Trivial MR  LHC on 01/06/2021   Dist LAD lesion is 50% stenosed.   Prox LAD lesion is 70% stenosed.   Ost RCA to Prox RCA lesion is 75% stenosed.   Prox RCA lesion is 75% stenosed.   Prox RCA to Mid RCA lesion is 95% stenosed.   Mid RCA lesion is 50% stenosed.   1st Mrg lesion is 50% stenosed.   Mid Cx-2 lesion is 70% stenosed.   2nd Mrg lesion is 95% stenosed.   Mid Cx-1 lesion is 50% stenosed.  Assessment & Plan    Patient is a 46 year old M known to have HTN, DM 2, HLD, OSA was admitted with NSTEMI.  LHC on 01/06/2022 showed severe three-vessel CAD, culprit lesion thrombotic lesion in the RCA.  CT surgery consulted for CABG.  CABG delayed due to worsening renal function post cath.  # CAD manifested by NSTEMI with three-vessel CAD, pending CABG versus PCI decision (based on serum creatinine improvement) -Serum creatinine continues to increase since admission. Nephrology following with plans to continue IV hydration. -Continue aspirin 81 mg once daily, atorvastatin 80 mg nightly, Coreg 12.5 twice daily, amlodipine 10 mg once daily. -Continue heparin drip  # Ischemic cardiomyopathy, LVEF 35 to 40% with RWMA: Continue carvedilol 12.5 mg twice daily, hydralazine 75 mg every 8 hours.  Cannot be on ACE/ARB/ARNI/  SGLT2/MRA due to AKI on CKD versus progressing CKD. Continue medical therapy with plans for revascularization when renal function returns to baseline.  # Penetrating abdominal aortic ulcer, presented with abdominal/back pain.  Mural thrombus of abdominal aorta with small protruding ulcer in the infrarenal abdominal aorta measuring 3 mm with no mural hematoma.  Evaluated by vascular surgery with recommendations for pain management and blood pressure  control, repeat scan 4 to 6 weeks as long as no recurrent pain.  # Hypertensive emergency, controlled today.  Continue carvedilol 12.5 mg twice daily, hydralazine 70 mg every 8 hours and amlodipine 10mg  once daily.  # AKI in setting of HTN emergency versus contrast-induced nephropathy: Nephrology on board and currently on IV fluids.  # Nicotine abuse, quit 4 weeks prior to admission.  I have spent a total of 33 minutes with patient reviewing chart , telemetry, EKGs, labs and examining patient as well as establishing an assessment and plan that was discussed with the patient.  > 50% of time was spent in direct patient care.     Signed, Chalmers Guest, MD  01/10/2022, 11:09 AM

## 2022-01-10 NOTE — Progress Notes (Signed)
ANTICOAGULATION CONSULT NOTE-Follow Up  Pharmacy Consult for heparin  Indication: chest pain/ACS  Allergies  Allergen Reactions   Augmentin [Amoxicillin-Pot Clavulanate] Hives and Itching    Has patient had a PCN reaction causing immediate rash, facial/tongue/throat swelling, SOB or lightheadedness with hypotension: Yes Has patient had a PCN reaction causing severe rash involving mucus membranes or skin necrosis: No Has patient had a PCN reaction that required hospitalization: No Has patient had a PCN reaction occurring within the last 10 years: No If all of the above answers are "NO", then may proceed with Cephalosporin use.   Contrast Media [Iodinated Contrast Media] Hives    Pt broke out with 1 hive on forehead after CT injection-treated with 25mg  Benadryl    Patient Measurements: Height: 5\' 11"  (180.3 cm) Weight: 117.4 kg (258 lb 12.8 oz) IBW/kg (Calculated) : 75.3 Heparin Dosing Weight: 99.5 kg  Vital Signs: Temp: 98.6 F (37 C) (12/16 1135) Temp Source: Oral (12/16 1135) BP: 102/51 (12/16 1135) Pulse Rate: 74 (12/16 1135)  Labs: Recent Labs    01/08/22 0207 01/08/22 1218 01/09/22 0207 01/10/22 0151 01/10/22 0949 01/10/22 1459  HGB 9.6*  --  9.1* 8.6* 10.1*  --   HCT 28.0*  --  26.0* 24.3* 28.0*  --   PLT 255  --  270 263 315  --   HEPARINUNFRC 0.42   < > 0.28* 0.26*  --  0.39  CREATININE 2.46*  --  3.01*  --  3.31*  --    < > = values in this interval not displayed.     Estimated Creatinine Clearance: 37.1 mL/min (A) (by C-G formula based on SCr of 3.31 mg/dL (H)).   Medical History: Past Medical History:  Diagnosis Date   Chest pain    HTN (hypertension)    Hyperlipidemia    Lesion of penis    foreskin   OSA (obstructive sleep apnea)    per pt dx osa and used cpap but after losing wt. from 415 pounds down to 244 pounds no longer needs cpap   Peripheral neuropathy    Phimosis    Scrotal abscess 05/08/2017   Type 2 diabetes mellitus ([place])    per  pt no meds for two years pt was changed to levemir unable to afford but pt states is waiting on approval for a program he applied for that will pay for his meds ([place])      Assessment: 46 yo M with c/f NSTEMI with elevated troponins and EKG changes. No AC PTA. 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   Heparin level therapeutic s/p rate increase to 2400 units/hr  Goal of Therapy:  Heparin level 0.3-0.7 units/ml Monitor platelets by anticoagulation protocol: Yes   Plan:  Continue heparin gtt at 2400 units/hr Daily heparin level, CBC, s/s bleeding  Bertis Ruddy, PharmD, [facility] Clinical Pharmacist ED Pharmacist Phone # [phone] 01/10/2022 3:57 PM

## 2022-01-10 NOTE — Progress Notes (Signed)
Patient continues to be very lethargic and blood pressure is soft. Crosley MD advised to hold 2200 hydralazine dose. Will continue to monitor blood pressure for abnormal changes.

## 2022-01-11 DIAGNOSIS — I719 Aortic aneurysm of unspecified site, without rupture: Secondary | ICD-10-CM | POA: Diagnosis not present

## 2022-01-11 DIAGNOSIS — N179 Acute kidney failure, unspecified: Secondary | ICD-10-CM | POA: Diagnosis not present

## 2022-01-11 DIAGNOSIS — R1033 Periumbilical pain: Secondary | ICD-10-CM | POA: Diagnosis not present

## 2022-01-11 DIAGNOSIS — I161 Hypertensive emergency: Secondary | ICD-10-CM | POA: Diagnosis not present

## 2022-01-11 DIAGNOSIS — R7989 Other specified abnormal findings of blood chemistry: Secondary | ICD-10-CM | POA: Diagnosis not present

## 2022-01-11 LAB — CBC
HCT: 25.6 % — ABNORMAL LOW (ref 39.0–52.0)
Hemoglobin: 8.9 g/dL — ABNORMAL LOW (ref 13.0–17.0)
MCH: 29.6 pg (ref 26.0–34.0)
MCHC: 34.8 g/dL (ref 30.0–36.0)
MCV: 85 fL (ref 80.0–100.0)
Platelets: 312 10*3/uL (ref 150–400)
RBC: 3.01 MIL/uL — ABNORMAL LOW (ref 4.22–5.81)
RDW: 13.2 % (ref 11.5–15.5)
WBC: 20.8 10*3/uL — ABNORMAL HIGH (ref 4.0–10.5)
nRBC: 0 % (ref 0.0–0.2)

## 2022-01-11 LAB — COMPREHENSIVE METABOLIC PANEL
ALT: 25 U/L (ref 0–44)
AST: 21 U/L (ref 15–41)
Albumin: 1.9 g/dL — ABNORMAL LOW (ref 3.5–5.0)
Alkaline Phosphatase: 160 U/L — ABNORMAL HIGH (ref 38–126)
Anion gap: 14 (ref 5–15)
BUN: 58 mg/dL — ABNORMAL HIGH (ref 6–20)
CO2: 21 mmol/L — ABNORMAL LOW (ref 22–32)
Calcium: 7.3 mg/dL — ABNORMAL LOW (ref 8.9–10.3)
Chloride: 94 mmol/L — ABNORMAL LOW (ref 98–111)
Creatinine, Ser: 3.02 mg/dL — ABNORMAL HIGH (ref 0.61–1.24)
GFR, Estimated: 25 mL/min — ABNORMAL LOW (ref >60)
Glucose, Bld: 140 mg/dL — ABNORMAL HIGH (ref 70–99)
Potassium: 3.4 mmol/L — ABNORMAL LOW (ref 3.5–5.1)
Sodium: 129 mmol/L — ABNORMAL LOW (ref 135–145)
Total Bilirubin: 0.6 mg/dL (ref 0.3–1.2)
Total Protein: 5.4 g/dL — ABNORMAL LOW (ref 6.5–8.1)

## 2022-01-11 LAB — GLUCOSE, CAPILLARY
Glucose-Capillary: 125 mg/dL — ABNORMAL HIGH (ref 70–99)
Glucose-Capillary: 128 mg/dL — ABNORMAL HIGH (ref 70–99)
Glucose-Capillary: 129 mg/dL — ABNORMAL HIGH (ref 70–99)
Glucose-Capillary: 133 mg/dL — ABNORMAL HIGH (ref 70–99)
Glucose-Capillary: 191 mg/dL — ABNORMAL HIGH (ref 70–99)
Glucose-Capillary: 192 mg/dL — ABNORMAL HIGH (ref 70–99)
Glucose-Capillary: 202 mg/dL — ABNORMAL HIGH (ref 70–99)

## 2022-01-11 LAB — HEPARIN LEVEL (UNFRACTIONATED): Heparin Unfractionated: 0.54 IU/mL (ref 0.30–0.70)

## 2022-01-11 LAB — PROCALCITONIN: Procalcitonin: 1.03 ng/mL

## 2022-01-11 MED ORDER — NITROGLYCERIN 0.4 MG SL SUBL
SUBLINGUAL_TABLET | SUBLINGUAL | Status: AC
  Administered 2022-01-11: 0.4 mg via SUBLINGUAL
  Filled 2022-01-11: qty 1

## 2022-01-11 MED ORDER — NITROGLYCERIN 0.4 MG SL SUBL
SUBLINGUAL_TABLET | SUBLINGUAL | Status: AC
  Administered 2022-01-11: 0.4 mg
  Filled 2022-01-11: qty 1

## 2022-01-11 MED ORDER — SODIUM CHLORIDE 0.9 % IV SOLN: INTRAVENOUS | Status: DC

## 2022-01-11 MED ORDER — PANTOPRAZOLE SODIUM 40 MG PO TBEC
40.0000 mg | DELAYED_RELEASE_TABLET | Freq: Two times a day (BID) | ORAL | Status: DC
  Administered 2022-01-11 – 2022-01-16 (×10): 40 mg via ORAL
  Filled 2022-01-11 (×10): qty 1

## 2022-01-11 MED ORDER — NITROGLYCERIN IN D5W 200-5 MCG/ML-% IV SOLN
0.0000 ug/min | INTRAVENOUS | Status: DC
  Administered 2022-01-12: 2 ug/min via INTRAVENOUS
  Filled 2022-01-11: qty 250

## 2022-01-11 MED ORDER — NITROGLYCERIN 0.4 MG SL SUBL
0.4000 mg | SUBLINGUAL_TABLET | SUBLINGUAL | Status: AC | PRN
  Administered 2022-01-11 (×2): 0.4 mg via SUBLINGUAL
  Filled 2022-01-11 (×2): qty 1

## 2022-01-11 MED ORDER — POTASSIUM CHLORIDE CRYS ER 20 MEQ PO TBCR
40.0000 meq | EXTENDED_RELEASE_TABLET | Freq: Once | ORAL | Status: AC
  Administered 2022-01-11: 40 meq via ORAL
  Filled 2022-01-11: qty 2

## 2022-01-11 NOTE — Progress Notes (Signed)
ANTICOAGULATION CONSULT NOTE-Follow Up  Pharmacy Consult for heparin  Indication: chest pain/ACS  Allergies  Allergen Reactions   Augmentin [Amoxicillin-Pot Clavulanate] Hives and Itching    Has patient had a PCN reaction causing immediate rash, facial/tongue/throat swelling, SOB or lightheadedness with hypotension: Yes Has patient had a PCN reaction causing severe rash involving mucus membranes or skin necrosis: No Has patient had a PCN reaction that required hospitalization: No Has patient had a PCN reaction occurring within the last 10 years: No If all of the above answers are "NO", then may proceed with Cephalosporin use.   Contrast Media [Iodinated Contrast Media] Hives    Pt broke out with 1 hive on forehead after CT injection-treated with 25mg  Benadryl    Patient Measurements: Height: 5\' 11"  (180.3 cm) Weight: 116.6 kg (257 lb 0.9 oz) IBW/kg (Calculated) : 75.3 Heparin Dosing Weight: 99.5 kg  Vital Signs: Temp: 98.6 F (37 C) (12/17 0711) Temp Source: Oral (12/17 0711) BP: 141/78 (12/17 0711) Pulse Rate: 78 (12/17 0711)  Labs: Recent Labs    01/09/22 0207 01/10/22 0151 01/10/22 0949 01/10/22 1459 01/11/22 0141  HGB 9.1* 8.6* 10.1*  --  8.9*  HCT 26.0* 24.3* 28.0*  --  25.6*  PLT 270 263 315  --  312  HEPARINUNFRC 0.28* 0.26*  --  0.39 0.54  CREATININE 3.01*  --  3.31*  --  3.02*     Estimated Creatinine Clearance: 40.5 mL/min (A) (by C-G formula based on SCr of 3.02 mg/dL (H)).   Medical History: Past Medical History:  Diagnosis Date   Chest pain    HTN (hypertension)    Hyperlipidemia    Lesion of penis    foreskin   OSA (obstructive sleep apnea)    per pt dx osa and used cpap but after losing wt. from 415 pounds down to 244 pounds no longer needs cpap   Peripheral neuropathy    Phimosis    Scrotal abscess 05/08/2017   Type 2 diabetes mellitus ([place])    per pt no meds for two years pt was changed to levemir unable to afford but pt states is  waiting on approval for a program he applied for that will pay for his meds ([place])      Assessment: 46 yo M with c/f NSTEMI with elevated troponins and EKG changes. No AC PTA. 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   Heparin level therapeutic at 0.54 while on rate of 2400 units/hr. Plts have remained within normal; limits, Hgb has dropped from 10.1 to 8.9. Per RN no issues with the infusion running or signs/symptoms of bleeding.  Goal of Therapy:  Heparin level 0.3-0.7 units/ml Monitor platelets by anticoagulation protocol: Yes   Plan:  Continue heparin gtt at 2400 units/hr Daily heparin level, CBC, s/s bleeding  Sandford Craze, PharmD. [facility] Acute Care PGY-1 01/11/2022 7:38 AM

## 2022-01-11 NOTE — Progress Notes (Addendum)
[facility]                                                                              Shaun Cummings, is a 46 y.o. male, DOB - Nov 05, 1975, GMW:[id number] Admit date - 01/04/2022    Outpatient Primary MD for the patient is Iona Beard, MD  LOS - 7  days  Chief Complaint  Patient presents with   Chest Pain       Brief summary   Patient is a 46 year old male with HTN, HLD, OSA, diabetes mellitus type 2 presented with chest pain.  In ED, BP noted to be 206/111, troponin 19 CTA chest abdomen pelvis showed small penetrating ulcer in the infrarenal abdominal aorta measuring 3 mm, no mural hematoma.  No evidence of aortic aneurysm or dissection.  Creatinine 1.9 Vascular surgery was consulted and recommended medical management.  Patient was placed on esmolol drip and was admitted to ICU by PCCM.  Transferred to the floor and [facility] assumed care on 01/07/2022  Significant Hospital Events:  12/10 presented to [facility]. CTA chest abd pelvis-  CTA chest abdomen pelvis with mall 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, VVS consult, PCCM consult 12/11 Denies pain today. Foot numbness better than a month ago, not worse. Echo with LVEF 35-40% with hypokinesis of the LV septal, anterior, & lateral walls. Severe hypokinesis oif the LV basal mid inferior wall. G1DD. LA moderately dilated, RA mildly dilated. Normal RV function.  12/12 LHC > severe multivessel disease, surgical consultation recommended    Assessment & Plan    Principal Problem:   Hypertensive emergency -Off esmolol drip, on labetalol PRN with parameters -BP controlled, continue Coreg, amlodipine, hydralazine   Active Problems: Severe multivessel coronary disease, NSTEMI Acute HFrEF -Cardiology was consulted, underwent 2D echo which showed EF of 35 to 40% with hypokinesis of LV septal, anterior and lateral walls.  Severe hypokinesis of the LV basal mid inferior wall, G1 DD. -Underwent  cardiac cath which showed severe multivessel disease, cardiology, TCTS -Plans for any intervention currently on hold due to worsening of the renal function. -Management per cardiology and CTVS - has been having off-and-on chest pain episodes, resolved with sublingual nitro, may need nitro drip, defer to cardiology.    AKI (acute kidney injury) ([place]) -Creatinine continues to trend up, 3.3 on 12/16-> 3.0 today -UA negative for UTI, renal ultrasound with no obstructive uropathy.  AKI likely due to contrast nephropathy. -Nephrology following, continue IV fluid hydration, supportive treatment    Penetrating abdominal aortic ulcer -Initially presented with abdominal/back pain, found to have mural thrombus of abdominal aorta with small penetrating ulcer and infrarenal abdominal aorta measuring 3 mm with no medial hematoma -Evaluated by vascular surgery, recommended pain management, BP control  -Abdominal pain has improved  Acute abdominal pain, history of GERD -Abdominal pain is now resolved -Lipase 24, recent CT abdomen showed no abdominal pathology except abdominal aortic ulcer -Abd XR 12/13 showed nonobstructive bowel gas pattern with moderate volume of stool within the colon, placed on bowel regimen -Continue PPI. GI signed off.   Diabetes mellitus type 2, uncontrolled with hyperglycemia Hemoglobin A1c 11.5 -  Continue Semglee 20 units daily, NovoLog 5 units 3 times daily AC, SSI  Recent Labs    01/10/22 1937 01/11/22 0009 01/11/22 0427 01/11/22 0833 01/11/22 1120 01/11/22 1227  GLUCAP 122* 133* 129* 128* 192* 191*     OSA -Continue CPAP nightly   GERD -Continue PPI  Obesity  Estimated body mass index is 35.85 kg/m as calculated from the following:   Height as of this encounter: 5\' 11"  (1.803 m).   Weight as of this encounter: 116.6 kg.  Code Status: Full code DVT Prophylaxis:  SCD's Start: 01/06/22 1235 SCDs Start: 01/04/22 2332   Level of Care: Level of care:  Progressive Family Communication: Updated patient's wife at the bedside today  Disposition Plan:      Remains inpatient appropriate:     Procedures:  Cardiac cath Consultants:   Vascular surgery Was admitted by CCM Cardiac thoracic surgery GI  Antimicrobials:     Medications  amLODipine  10 mg Oral Daily   aspirin  81 mg Oral Daily   atorvastatin  80 mg Oral Daily   carvedilol  12.5 mg Oral BID WC   Chlorhexidine Gluconate Cloth  6 each Topical Daily   docusate sodium  100 mg Oral BID   hydrALAZINE  75 mg Oral Q8H   insulin aspart  0-15 Units Subcutaneous TID WC   insulin aspart  0-5 Units Subcutaneous QHS   insulin aspart  5 Units Subcutaneous TID WC   insulin glargine-yfgn  20 Units Subcutaneous Daily   nitroGLYCERIN  0.3 mg Transdermal Daily   pantoprazole  40 mg Oral Q12H   sodium chloride flush  3 mL Intravenous Q12H   sodium chloride flush  3 mL Intravenous Q12H      Subjective:   Shaun Cummings was seen and examined today.  Had a reaction to Percocet yesterday, discontinued.  Currently no acute issues, nausea vomiting or abdominal pain.  Feeling low energy, tired and generalized weakness.  Wife at the bedside.  Has been having off-and-on chest pain episodes, resolved with sublingual nitro Objective:   Vitals:   01/11/22 1121 01/11/22 1222 01/11/22 1323 01/11/22 1340  BP: 110/72  123/72 120/68  Pulse: 69  72 71  Resp: 16 19 18 18   Temp: 98.1 F (36.7 C) 98.6 F (37 C)    TempSrc: Oral Oral    SpO2: (!) 83%  96% 93%  Weight:      Height:        Intake/Output Summary (Last 24 hours) at 01/11/2022 1413 Last data filed at 01/11/2022 0630 Gross per 24 hour  Intake 236 ml  Output 1100 ml  Net -864 ml     Wt Readings from Last 3 Encounters:  01/11/22 116.6 kg  12/30/21 112.3 kg  12/16/21 113.9 kg   Physical Exam General: Alert and oriented x 3, NAD Cardiovascular: S1 S2 clear, RRR.  Respiratory: CTAB, no wheezing, rales or  rhonchi Gastrointestinal: Soft, nontender, nondistended, NBS Ext: no pedal edema bilaterally Psych: Normal affect     Data Reviewed:  I have personally reviewed following labs    CBC Lab Results  Component Value Date   WBC 20.8 (H) 01/11/2022   RBC 3.01 (L) 01/11/2022   HGB 8.9 (L) 01/11/2022   HCT 25.6 (L) 01/11/2022   MCV 85.0 01/11/2022   MCH 29.6 01/11/2022   PLT 312 01/11/2022   MCHC 34.8 01/11/2022   RDW 13.2 01/11/2022   LYMPHSABS 1.1 01/07/2022   MONOABS 1.8 (H) 01/07/2022   EOSABS  0.0 01/07/2022   BASOSABS 0.0 94/32/7614     Last metabolic panel Lab Results  Component Value Date   NA 129 (L) 01/11/2022   K 3.4 (L) 01/11/2022   CL 94 (L) 01/11/2022   CO2 21 (L) 01/11/2022   BUN 58 (H) 01/11/2022   CREATININE 3.02 (H) 01/11/2022   GLUCOSE 140 (H) 01/11/2022   GFRNONAA 25 (L) 01/11/2022   GFRAA >60 09/08/2019   CALCIUM 7.3 (L) 01/11/2022   PROT 5.4 (L) 01/11/2022   ALBUMIN 1.9 (L) 01/11/2022   BILITOT 0.6 01/11/2022   ALKPHOS 160 (H) 01/11/2022   AST 21 01/11/2022   ALT 25 01/11/2022   ANIONGAP 14 01/11/2022    CBG (last 3)  Recent Labs    01/11/22 0833 01/11/22 1120 01/11/22 1227  GLUCAP 128* 192* 191*      Coagulation Profile: No results for input(s): "INR", "PROTIME" in the last 168 hours.   Radiology Studies: I have personally reviewed the imaging studies  No results found.     Estill Cotta M.D. [facility] 01/11/2022, 2:13 PM  Available via Epic secure chat 7am-7pm After 7 pm, please refer to night coverage provider listed on amion.

## 2022-01-11 NOTE — Progress Notes (Addendum)
Patient c/o 7/10 chest pain radiating to back. Denies SOB.  EKG done.  Being treated with SL NTG.  Sandria Senter PA paged.  Dr. Tana Coast updated on situation.

## 2022-01-11 NOTE — Progress Notes (Signed)
Pt reporting 8/10 chest pain w/ tingling in L-arm. Describes pain also radiates to R-posterior back. Pain refractory to x3 SL nitroglycerin administration. Marcelle Smiling, MD w/ cardiology notified. Discussed dilaudid vs nitroglycerin gtt. Plan to give dilaudid dose and if pain is not resolved to start nitro gtt.

## 2022-01-11 NOTE — Progress Notes (Signed)
Pt reports chest pain 8/10  radiating to right chest and back  with fatigue.  BP 110s/60s.  1 SL ntg given with minimal relief.  BP 110/61.  Pt declines oxygen placement.  EKG with no acute changes.  Dr. Marcelle Smiling notified and awaiting further orders.

## 2022-01-11 NOTE — Progress Notes (Signed)
Mobility Specialist Progress Note    01/11/22 1453  Mobility  Activity Contraindicated/medical hold   RN advised d/t CP and lethargy. Will f/u as appropriate.   Hildred Alamin Mobility Specialist  Please Psychologist, sport and exercise or Rehab Office at [phone]

## 2022-01-11 NOTE — Progress Notes (Signed)
Shaun Cummings [facility] cards called back and updated. BP 115/67, HR 71, O2 placed at 2 L n/c  for support.

## 2022-01-11 NOTE — Progress Notes (Signed)
Notified by RN that patient was complaining of chest pain. Patient underwent LHC on 12/12 that showed severe three-vessel CAD with a culprit thrombotic lesion in the RCA. CT Surgery consulted for CABG, but this has been delayed due to worsening renal function post cath. Patient remains on IV heparin. BP improving, last recorded 115/71.  EKG reviewed obtained and reviewed--  no changes when compared to previous.   On my evaluation, patient is lethargic and difficult to obtain history from. He reports that his chest pain has been improving with SL nitro. Complains of back pain and being uncomfortable in the bed. RN adjusted patient in the bed so that he was sitting more upright, and patient reported feeling more comfortable. RN informed me that patient has been very sleepy for the past 2 days, and Dr. Tana Coast informed me that patient was very low energy this AM.   Patient should remain on IV heparin. If needed, can start IV nitro for chest pain. For now, chest pain is improving with SL nitro.   Margie Billet, PA-C 01/11/2022 2:32 PM

## 2022-01-11 NOTE — Progress Notes (Signed)
Progress Note  Patient Name: Shaun Cummings Date of Encounter: 01/11/2022  Primary Cardiologist: Freada Bergeron, MD  Subjective   No acute events overnight. Patient had a reaction to Percocet.  His blood pressure seems to be little elevated this morning.  Inpatient Medications    Scheduled Meds:  amLODipine  10 mg Oral Daily   aspirin  81 mg Oral Daily   atorvastatin  80 mg Oral Daily   carvedilol  12.5 mg Oral BID WC   Chlorhexidine Gluconate Cloth  6 each Topical Daily   docusate sodium  100 mg Oral BID   hydrALAZINE  75 mg Oral Q8H   insulin aspart  0-15 Units Subcutaneous TID WC   insulin aspart  0-5 Units Subcutaneous QHS   insulin aspart  5 Units Subcutaneous TID WC   insulin glargine-yfgn  20 Units Subcutaneous Daily   nitroGLYCERIN  0.3 mg Transdermal Daily   pantoprazole (PROTONIX) IV  40 mg Intravenous Q12H   sodium chloride flush  3 mL Intravenous Q12H   sodium chloride flush  3 mL Intravenous Q12H   Continuous Infusions:  sodium chloride     sodium chloride 10 mL/hr at 01/08/22 2359   diphenhydrAMINE     heparin 2,400 Units/hr (01/11/22 0409)   PRN Meds: sodium chloride, acetaminophen, acetaminophen, diazepam, diphenhydrAMINE, HYDROmorphone (DILAUDID) injection, naLOXone (NARCAN)  injection, nitroGLYCERIN, ondansetron (ZOFRAN) IV, mouth rinse, polyethylene glycol, sodium chloride flush   Vital Signs    Vitals:   01/11/22 0500 01/11/22 0705 01/11/22 0711 01/11/22 0922  BP:  (!) 141/78 (!) 141/78   Pulse:  75 78 79  Resp:      Temp:   98.6 F (37 C)   TempSrc:   Oral   SpO2:  96%    Weight: 116.6 kg     Height:        Intake/Output Summary (Last 24 hours) at 01/11/2022 1009 Last data filed at 01/11/2022 0630 Gross per 24 hour  Intake 236 ml  Output 1100 ml  Net -864 ml   Filed Weights   01/09/22 0530 01/10/22 0334 01/11/22 0500  Weight: 119.4 kg 117.4 kg 116.6 kg    Telemetry     Personally reviewed, NSR  ECG    An ECG dated  01/09/22 was personally reviewed today and demonstrated:  NSR, TWI in lateral leads  Physical Exam   GEN: No acute distress.   Neck: No JVD. Cardiac: RRR, no murmur, rub, or gallop.  Respiratory: Nonlabored. Clear to auscultation bilaterally. GI: Soft, nontender, bowel sounds present. MS: No edema; No deformity. Neuro:  Nonfocal. Psych: Alert and oriented x 3. Normal affect.  Labs    Chemistry Recent Labs  Lab 01/04/22 2037 01/05/22 0321 01/09/22 0207 01/10/22 0949 01/11/22 0141  NA 138   < > 128* 128* 129*  K 3.4*   < > 3.1* 3.2* 3.4*  CL 102   < > 94* 94* 94*  CO2 22   < > 22 23 21*  GLUCOSE 242*   < > 162* 212* 140*  BUN 24*   < > 51* 57* 58*  CREATININE 1.90*   < > 3.01* 3.31* 3.02*  CALCIUM 9.0   < > 7.4* 7.5* 7.3*  PROT 7.4  --   --  5.8* 5.4*  ALBUMIN 3.1*  --   --  2.0* 1.9*  AST 17  --   --  18 21  ALT 14  --   --  25 25  ALKPHOS 81  --   --  163* 160*  BILITOT 0.3  --   --  0.7 0.6  GFRNONAA 44*   < > 25* 23* 25*  ANIONGAP 14   < > 12 11 14    < > = values in this interval not displayed.     Hematology Recent Labs  Lab 01/10/22 0151 01/10/22 0949 01/11/22 0141  WBC 17.8* 20.0* 20.8*  RBC 2.86* 3.34* 3.01*  HGB 8.6* 10.1* 8.9*  HCT 24.3* 28.0* 25.6*  MCV 85.0 83.8 85.0  MCH 30.1 30.2 29.6  MCHC 35.4 36.1* 34.8  RDW 13.2 13.0 13.2  PLT 263 315 312    Cardiac Enzymes Recent Labs  Lab 01/04/22 2037 01/04/22 2209 01/05/22 0321 01/05/22 0654 01/05/22 0910  TROPONINIHS 19* 22* 1,719* 4,399* 7,294*    BNPNo results for input(s): "BNP", "PROBNP" in the last 168 hours.   DDimerNo results for input(s): "DDIMER" in the last 168 hours.   Radiology    No results found.  Echo from 01/05/2022 LVEF 35 to 40% with RWMA RV systolic function is normal Trivial MR  LHC on 01/06/2021   Dist LAD lesion is 50% stenosed.   Prox LAD lesion is 70% stenosed.   Ost RCA to Prox RCA lesion is 75% stenosed.   Prox RCA lesion is 75% stenosed.   Prox RCA  to Mid RCA lesion is 95% stenosed.   Mid RCA lesion is 50% stenosed.   1st Mrg lesion is 50% stenosed.   Mid Cx-2 lesion is 70% stenosed.   2nd Mrg lesion is 95% stenosed.   Mid Cx-1 lesion is 50% stenosed.  Assessment & Plan    Patient is a 46 year old M known to have HTN, DM 2, HLD, OSA was admitted with NSTEMI.  LHC on 01/06/2022 showed severe three-vessel CAD, culprit lesion thrombotic lesion in the RCA.  CT surgery consulted for CABG.  CABG delayed due to worsening renal function post cath.  # CAD manifested by NSTEMI with three-vessel CAD, pending CABG versus PCI decision (based on serum creatinine improvement) -Serum creatinine is downtrending.  Nephrology following with plans to continue IV hydration. -Continue aspirin 81 mg once daily, atorvastatin 80 mg nightly, increase carvedilol from 12.5 to 25 mg twice daily, continue amlodipine 10 mg once daily -Continue heparin drip  # Ischemic cardiomyopathy, LVEF 35 to 40% with RWMA: -Increase carvedilol from 12.5 to 25 mg twice daily, continue hydralazine 75 mg every 8 hours. Cannot be on ACE/ARB/Arni/SGLT2/MRA due to AKI on CKD versus progressing CKD.  Continue medical therapy with plans for revascularization when renal function returns to baseline.  # Penetrating abdominal aortic ulcer, presented with abdominal/back pain.  Mural thrombus of abdominal aorta with small protruding also in the infrarenal abdominal aorta measuring 3 mm with no mural hematoma.  Evaluated by vascular surgery with recommendations for pain management and blood pressure control, repeat scan in 4 to 6 weeks as long as no recurrent pain.  # Hypertensive emergency, controlled today.   Increase carvedilol from 12.5 to 25 mg twice daily, continue hydralazine 75 mg every 8 hours and amlodipine 10 mg once daily  # AKI in setting of HTN emergency versus contrast-induced nephropathy: Nephrology on board and currently on IV fluids.  # Nicotine abuse, quit 4 weeks prior to  admission.  I have spent a total of 35 minutes with patient reviewing chart , telemetry, EKGs, labs and examining patient as well as establishing an assessment and plan that was discussed with the patient.  > 50% of time was spent  in direct patient care.     Signed, Leeon Makar Olene Craven, MD  01/11/2022, 10:09 AM

## 2022-01-11 NOTE — Progress Notes (Signed)
Admit: 01/04/2022 LOS: 7  62M AKI with baseline CKD3 / DKD, NSTEMI with multivessel disease plan for CABG vs PCI; also aortic ulcer  Subjective:  UOP at least 1.1L SCr improved today! Family at bedside   12/16 0701 - 12/17 0700 In: 27 [P.O.:236] Out: 1100 [Urine:1100]  Filed Weights   01/09/22 0530 01/10/22 0334 01/11/22 0500  Weight: 119.4 kg 117.4 kg 116.6 kg    Scheduled Meds:  amLODipine  10 mg Oral Daily   aspirin  81 mg Oral Daily   atorvastatin  80 mg Oral Daily   carvedilol  12.5 mg Oral BID WC   Chlorhexidine Gluconate Cloth  6 each Topical Daily   docusate sodium  100 mg Oral BID   hydrALAZINE  75 mg Oral Q8H   insulin aspart  0-15 Units Subcutaneous TID WC   insulin aspart  0-5 Units Subcutaneous QHS   insulin aspart  5 Units Subcutaneous TID WC   insulin glargine-yfgn  20 Units Subcutaneous Daily   nitroGLYCERIN  0.3 mg Transdermal Daily   pantoprazole  40 mg Oral Q12H   sodium chloride flush  3 mL Intravenous Q12H   sodium chloride flush  3 mL Intravenous Q12H   Continuous Infusions:  sodium chloride     sodium chloride 10 mL/hr at 01/08/22 2359   diphenhydrAMINE     heparin 2,400 Units/hr (01/11/22 0409)   PRN Meds:.sodium chloride, acetaminophen, acetaminophen, diazepam, diphenhydrAMINE, HYDROmorphone (DILAUDID) injection, naLOXone (NARCAN)  injection, nitroGLYCERIN, ondansetron (ZOFRAN) IV, mouth rinse, polyethylene glycol, sodium chloride flush  Current Labs: reviewed    Physical Exam:  Blood pressure 110/72, pulse 69, temperature 98.1 F (36.7 C), temperature source Oral, resp. rate 16, height 5\' 11"  (1.803 m), weight 116.6 kg, SpO2 (!) 83 %. GEN: NAD ENT: NCAT EYES: EOMI CV: Regular, normal S1 and S2, no rub PULM: Diminished in the bases, clear ABD: Soft, nontender.  No rebound or guarding. 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; doubt additional GN present Most recent UA without hematuria Renal ultrasound reassuring with findings consistent with medical renal disease Acute insult almost certainly is contrast and acute illness /hypertensive emergency NSTEMI with LHC showing multivessel disease with plan for CABG vs PCI both on hold pending GFR recovery Hypertensive emergency at admission, improved on oral agents Penetrating aortic ulcer followed by vascular surgery Abdominal pain, question if related #4, GI eval'd; resolved in time Metabolic acidosis stable Mild hyponatremia, monitor Leukocytosis, stable Normocytic anemia Recent tobacco cessation OSA Hyperlipidemia  P GFR recovering, great news Can cont PO, no IVFs needed Daily weights, Daily Renal Panel, Strict I/Os, Avoid nephrotoxins (NSAIDs, judicious IV Contrast)  Medication Issues; Preferred narcotic agents for pain control are hydromorphone, fentanyl, and methadone. Morphine should not be used.  Baclofen should be avoided Avoid oral sodium phosphate and magnesium citrate based laxatives / bowel preps    Pearson Grippe MD 01/11/2022, 11:59 AM  Recent Labs  Lab 01/09/22 0207 01/10/22 0949 01/11/22 0141  NA 128* 128* 129*  K 3.1* 3.2* 3.4*  CL 94* 94* 94*  CO2 22 23 21*  GLUCOSE 162* 212* 140*  BUN 51* 57* 58*  CREATININE 3.01* 3.31* 3.02*  CALCIUM 7.4* 7.5* 7.3*    Recent Labs  Lab 01/07/22 1008 01/08/22 0207 01/10/22 0151  01/10/22 0949 01/11/22 0141  WBC 22.0*   < > 17.8* 20.0* 20.8*  NEUTROABS 18.9*  --   --   --   --   HGB 10.4*   < > 8.6* 10.1* 8.9*  HCT 29.2*   < > 24.3* 28.0* 25.6*  MCV 83.7   < > 85.0 83.8 85.0  PLT 295   < > 263 315 312   < > = values in this interval not displayed.

## 2022-01-12 DIAGNOSIS — I25110 Atherosclerotic heart disease of native coronary artery with unstable angina pectoris: Secondary | ICD-10-CM | POA: Diagnosis not present

## 2022-01-12 DIAGNOSIS — N179 Acute kidney failure, unspecified: Secondary | ICD-10-CM | POA: Diagnosis not present

## 2022-01-12 DIAGNOSIS — I161 Hypertensive emergency: Secondary | ICD-10-CM | POA: Diagnosis not present

## 2022-01-12 LAB — COMPREHENSIVE METABOLIC PANEL
ALT: 31 U/L (ref 0–44)
AST: 30 U/L (ref 15–41)
Albumin: 1.9 g/dL — ABNORMAL LOW (ref 3.5–5.0)
Alkaline Phosphatase: 197 U/L — ABNORMAL HIGH (ref 38–126)
Anion gap: 10 (ref 5–15)
BUN: 51 mg/dL — ABNORMAL HIGH (ref 6–20)
CO2: 22 mmol/L (ref 22–32)
Calcium: 7.3 mg/dL — ABNORMAL LOW (ref 8.9–10.3)
Chloride: 96 mmol/L — ABNORMAL LOW (ref 98–111)
Creatinine, Ser: 2.58 mg/dL — ABNORMAL HIGH (ref 0.61–1.24)
GFR, Estimated: 31 mL/min — ABNORMAL LOW (ref >60)
Glucose, Bld: 137 mg/dL — ABNORMAL HIGH (ref 70–99)
Potassium: 3.6 mmol/L (ref 3.5–5.1)
Sodium: 128 mmol/L — ABNORMAL LOW (ref 135–145)
Total Bilirubin: 0.8 mg/dL (ref 0.3–1.2)
Total Protein: 5.6 g/dL — ABNORMAL LOW (ref 6.5–8.1)

## 2022-01-12 LAB — LACTIC ACID, PLASMA: Lactic Acid, Venous: 0.8 mmol/L (ref 0.5–1.9)

## 2022-01-12 LAB — CBC
HCT: 25.7 % — ABNORMAL LOW (ref 39.0–52.0)
Hemoglobin: 9.2 g/dL — ABNORMAL LOW (ref 13.0–17.0)
MCH: 30 pg (ref 26.0–34.0)
MCHC: 35.8 g/dL (ref 30.0–36.0)
MCV: 83.7 fL (ref 80.0–100.0)
Platelets: 330 10*3/uL (ref 150–400)
RBC: 3.07 MIL/uL — ABNORMAL LOW (ref 4.22–5.81)
RDW: 13.2 % (ref 11.5–15.5)
WBC: 20.1 10*3/uL — ABNORMAL HIGH (ref 4.0–10.5)
nRBC: 0 % (ref 0.0–0.2)

## 2022-01-12 LAB — TROPONIN I (HIGH SENSITIVITY): Troponin I (High Sensitivity): 448 ng/L (ref <18)

## 2022-01-12 LAB — GLUCOSE, CAPILLARY
Glucose-Capillary: 159 mg/dL — ABNORMAL HIGH (ref 70–99)
Glucose-Capillary: 172 mg/dL — ABNORMAL HIGH (ref 70–99)
Glucose-Capillary: 156 mg/dL — ABNORMAL HIGH (ref 70–99)
Glucose-Capillary: 240 mg/dL — ABNORMAL HIGH (ref 70–99)

## 2022-01-12 LAB — HEPARIN LEVEL (UNFRACTIONATED): Heparin Unfractionated: 0.68 IU/mL (ref 0.30–0.70)

## 2022-01-12 MED ORDER — HYDRALAZINE HCL 50 MG PO TABS
50.0000 mg | ORAL_TABLET | Freq: Three times a day (TID) | ORAL | Status: DC
  Administered 2022-01-12 – 2022-01-16 (×10): 50 mg via ORAL
  Filled 2022-01-12 (×10): qty 1

## 2022-01-12 MED ORDER — LACTULOSE 10 GM/15ML PO SOLN
10.0000 g | Freq: Every day | ORAL | Status: DC
  Administered 2022-01-12 – 2022-01-16 (×4): 10 g via ORAL
  Filled 2022-01-12 (×4): qty 15

## 2022-01-12 NOTE — Progress Notes (Signed)
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; doubt additional GN present Most recent UA without hematuria Renal ultrasound reassuring with findings consistent with medical renal disease Acute insult almost certainly is contrast and acute illness /hypertensive emergency Cr downtrending, would like to see more eGFR recovery before invasive procedures if possible NSTEMI with LHC showing multivessel disease with plan for CABG vs PCI both on hold pending GFR recovery Hypertensive emergency at admission, improved on oral agents Penetrating aortic ulcer followed by vascular surgery Abdominal pain, question if related #4, GI eval'd; resolved in time Metabolic acidosis stable Mild hyponatremia, monitor Leukocytosis, stable Normocytic anemia Recent tobacco cessation OSA Hyperlipidemia    Subjective:    Cr continues to trend down off IVFs- excellent.  D/w pt and family at bedside.     Objective:   BP 133/75   Pulse 75   Temp 98.6 F (37 C) (Oral)   Resp 18   Ht _0  (1.803 m)   Wt 117.3 kg   SpO2 90%   BMI 36.07 kg/m   Physical Exam: Gen:NAD, lying in bed CVS: RRR Resp: clear Abd: soft Ext: no LE edema  Labs: BMET Recent Labs  Lab 01/07/22 0809 01/07/22 1509 01/08/22 0207 01/09/22 0207 01/10/22 0949 01/11/22 0141 01/12/22 0230  NA 132* 133* 131* 128* 128* 129* 128*  K 3.7 3.6 3.5 3.1* 3.2* 3.4* 3.6  CL 104 105 102 94* 94* 94* 96*  CO2 19* 19* 18* 22 23 21* 22  GLUCOSE 220* 165* 170* 162* 212* 140* 137*  BUN 38* 40* 41* 51* 57* 58* 51*  CREATININE 2.25* 2.42* 2.46* 3.01* 3.31* 3.02* 2.58*  CALCIUM 8.4* 8.2* 8.0* 7.4* 7.5* 7.3* 7.3*   CBC Recent Labs  Lab 01/07/22 1008 01/08/22 0207 01/10/22 0151 01/10/22 0949  01/11/22 0141 01/12/22 0230  WBC 22.0*   < > 17.8* 20.0* 20.8* 20.1*  NEUTROABS 18.9*  --   --   --   --   --   HGB 10.4*   < > 8.6* 10.1* 8.9* 9.2*  HCT 29.2*   < > 24.3* 28.0* 25.6* 25.7*  MCV 83.7   < > 85.0 83.8 85.0 83.7  PLT 295   < > 263 315 312 330   < > = values in this interval not displayed.      Medications:     amLODipine  10 mg Oral Daily   aspirin  81 mg Oral Daily   atorvastatin  80 mg Oral Daily   carvedilol  12.5 mg Oral BID WC   Chlorhexidine Gluconate Cloth  6 each Topical Daily   docusate sodium  100 mg Oral BID   hydrALAZINE  50 mg Oral Q8H   insulin aspart  0-15 Units Subcutaneous TID WC   insulin aspart  0-5 Units Subcutaneous QHS   insulin aspart  5 Units Subcutaneous TID WC   insulin glargine-yfgn  20 Units Subcutaneous Daily   lactulose  10 g Oral Daily   pantoprazole  40 mg Oral Q12H   sodium chloride flush  3 mL Intravenous Q12H   sodium chloride flush  3 mL Intravenous Q12H     Madelon Lips, MD 01/12/2022, 2:56 PM

## 2022-01-12 NOTE — Progress Notes (Addendum)
Rounding Note    Patient Name: Shaun Cummings Date of Encounter: 01/12/2022  [place] Cardiologist: Freada Bergeron, MD   Subjective   Had more chest pain overnight. Now resolved with IV nitro.   Inpatient Medications    Scheduled Meds:  amLODipine  10 mg Oral Daily   aspirin  81 mg Oral Daily   atorvastatin  80 mg Oral Daily   carvedilol  12.5 mg Oral BID WC   Chlorhexidine Gluconate Cloth  6 each Topical Daily   docusate sodium  100 mg Oral BID   hydrALAZINE  75 mg Oral Q8H   insulin aspart  0-15 Units Subcutaneous TID WC   insulin aspart  0-5 Units Subcutaneous QHS   insulin aspart  5 Units Subcutaneous TID WC   insulin glargine-yfgn  20 Units Subcutaneous Daily   pantoprazole  40 mg Oral Q12H   sodium chloride flush  3 mL Intravenous Q12H   sodium chloride flush  3 mL Intravenous Q12H   Continuous Infusions:  sodium chloride     sodium chloride 10 mL/hr at 01/08/22 2359   sodium chloride     diphenhydrAMINE     heparin 2,400 Units/hr (01/12/22 0128)   nitroGLYCERIN 15 mcg/min (01/12/22 0945)   PRN Meds: sodium chloride, acetaminophen, acetaminophen, diazepam, diphenhydrAMINE, HYDROmorphone (DILAUDID) injection, naLOXone (NARCAN)  injection, ondansetron (ZOFRAN) IV, mouth rinse, polyethylene glycol, sodium chloride flush   Vital Signs    Vitals:   01/11/22 2044 01/11/22 2100 01/12/22 0005 01/12/22 0350  BP: 114/65 110/61 123/70 120/64  Pulse: 67 67 65 75  Resp:    18  Temp:    98.6 F (37 C)  TempSrc:    Oral  SpO2: 96% 94% 94%   Weight:    117.3 kg  Height:        Intake/Output Summary (Last 24 hours) at 01/12/2022 0951 Last data filed at 01/12/2022 0945 Gross per 24 hour  Intake 1165.21 ml  Output 1650 ml  Net -484.79 ml      01/12/2022    3:50 AM 01/11/2022    5:00 AM 01/10/2022    3:34 AM  Last 3 Weights  Weight (lbs) 258 lb 9.6 oz 257 lb 0.9 oz 258 lb 12.8 oz  Weight (kg) 117.3 kg 116.6 kg 117.391 kg      Telemetry     Sinus Rhythm - Personally Reviewed  ECG    No new tracing this morning  Physical Exam   GEN: No acute distress.   Neck: No JVD Cardiac: RRR, no murmurs, rubs, or gallops.  Respiratory: Clear to auscultation bilaterally. GI: Soft, nontender, non-distended  MS: pedal/hand edema; No deformity. Neuro:  Nonfocal  Psych: Normal affect   Labs    High Sensitivity Troponin:   Recent Labs  Lab 01/04/22 2209 01/05/22 0321 01/05/22 0654 01/05/22 0910 01/12/22 0230  TROPONINIHS 22* 1,719* 4,399* 7,294* 448*     Chemistry Recent Labs  Lab 01/10/22 0949 01/11/22 0141 01/12/22 0230  NA 128* 129* 128*  K 3.2* 3.4* 3.6  CL 94* 94* 96*  CO2 23 21* 22  GLUCOSE 212* 140* 137*  BUN 57* 58* 51*  CREATININE 3.31* 3.02* 2.58*  CALCIUM 7.5* 7.3* 7.3*  PROT 5.8* 5.4* 5.6*  ALBUMIN 2.0* 1.9* 1.9*  AST 18 21 30   ALT 25 25 31   ALKPHOS 163* 160* 197*  BILITOT 0.7 0.6 0.8  GFRNONAA 23* 25* 31*  ANIONGAP 11 14 10     Lipids No results for input(s): "  CHOL", "TRIG", "HDL", "LABVLDL", "LDLCALC", "CHOLHDL" in the last 168 hours.  Hematology Recent Labs  Lab 01/10/22 0949 01/11/22 0141 01/12/22 0230  WBC 20.0* 20.8* 20.1*  RBC 3.34* 3.01* 3.07*  HGB 10.1* 8.9* 9.2*  HCT 28.0* 25.6* 25.7*  MCV 83.8 85.0 83.7  MCH 30.2 29.6 30.0  MCHC 36.1* 34.8 35.8  RDW 13.0 13.2 13.2  PLT 315 312 330   Thyroid No results for input(s): "TSH", "FREET4" in the last 168 hours.  BNPNo results for input(s): "BNP", "PROBNP" in the last 168 hours.  DDimer No results for input(s): "DDIMER" in the last 168 hours.   Radiology    No results found.  Cardiac Studies   Cath: 01/06/2022     Dist LAD lesion is 50% stenosed.   Prox LAD lesion is 70% stenosed.   Ost RCA to Prox RCA lesion is 75% stenosed.   Prox RCA lesion is 75% stenosed.   Prox RCA to Mid RCA lesion is 95% stenosed.   Mid RCA lesion is 50% stenosed.   1st Mrg lesion is 50% stenosed.   Mid Cx-2 lesion is 70% stenosed.   2nd Mrg  lesion is 95% stenosed.   Mid Cx-1 lesion is 50% stenosed.   Severe multivessel CAD with the dominant RCA being the "culprit" vessel contributing to the non-STEMI with 70% diffuse proximal stenosis followed by 70% and 95% thrombotic mid stenosis and 50% distal stenosis.   There is 70% diffuse proximal LAD stenosis with diffuse 50% distal LAD stenosis.   The left circumflex vessel has diffuse 50% stenosis in the small OM 2 vessel with 95% focal stenosis in the OM 3 vessel and bifurcation AV groove 50% stenosis followed by 70% distal stenosis.   LVEDP elevated at 27 mm.   RECOMMENDATION: Surgical consultation will be recommended for CABG revascularization.  Will resume heparin 8 hours post sheath discontinuance.  Initiate medical therapy for significant CAD and aggressive lipid-lowering therapy with target LDL less than 55.  Smoking cessation is essential.     Diagnostic Dominance: Right  Echo: 01/05/2022   IMPRESSIONS     1. Left ventricular ejection fraction, by estimation, is 35 to 40%. The  left ventricle has moderately decreased function. The left ventricle  demonstrates regional wall motion abnormalities (see scoring  diagram/findings for description). The left  ventricular internal cavity size was mildly dilated. Left ventricular  diastolic parameters are consistent with Grade I diastolic dysfunction  (impaired relaxation). There is hypokinesis of the left ventricular,  septal wall, anterior wall and lateral wall.   There is severe hypokinesis of the left ventricular, basal-mid inferior  wall.   2. Right ventricular systolic function is normal. The right ventricular  size is normal.   3. Left atrial size was moderately dilated.   4. Right atrial size was mildly dilated.   5. The mitral valve is normal in structure. Trivial mitral valve  regurgitation. No evidence of mitral stenosis.   6. The aortic valve is grossly normal. Aortic valve regurgitation is not  visualized. No  aortic stenosis is present.   7. The inferior vena cava is normal in size with greater than 50%  respiratory variability, suggesting right atrial pressure of 3 mmHg.   Comparison(s): No prior Echocardiogram.   Conclusion(s)/Recommendation(s): Reduced LVEF, with global hypokinesis but  also severe focal hypokinesis of basal to mid inferior wall. Results will  be communicated to primary team.   FINDINGS   Left Ventricle: Left ventricular ejection fraction, by estimation, is 35  to 40%. The left ventricle has moderately decreased function. The left  ventricle demonstrates regional wall motion abnormalities. Severe  hypokinesis of the left ventricular,  basal-mid inferior wall. The left ventricular internal cavity size was  mildly dilated. There is borderline left ventricular hypertrophy. Left  ventricular diastolic parameters are consistent with Grade I diastolic  dysfunction (impaired relaxation).   Right Ventricle: The right ventricular size is normal. No increase in  right ventricular wall thickness. Right ventricular systolic function is  normal.   Left Atrium: Left atrial size was moderately dilated.   Right Atrium: Right atrial size was mildly dilated.   Pericardium: There is no evidence of pericardial effusion.   Mitral Valve: The mitral valve is normal in structure. Trivial mitral  valve regurgitation. No evidence of mitral valve stenosis.   Tricuspid Valve: The tricuspid valve is normal in structure. Tricuspid  valve regurgitation is trivial. No evidence of tricuspid stenosis.   Aortic Valve: The aortic valve is grossly normal. Aortic valve  regurgitation is not visualized. No aortic stenosis is present.   Pulmonic Valve: The pulmonic valve was grossly normal. Pulmonic valve  regurgitation is not visualized. No evidence of pulmonic stenosis.   Aorta: The aortic root, ascending aorta and aortic arch are all  structurally normal, with no evidence of dilitation or  obstruction.   Venous: The inferior vena cava is normal in size with greater than 50%  respiratory variability, suggesting right atrial pressure of 3 mmHg.   IAS/Shunts: The atrial septum is grossly normal.     Patient Profile     46 y.o. male with a hx of HTN, HLD, DM and OSA who is being seen 01/05/2022 for the evaluation of elevated troponin at the request of Dr. Carlis Abbott.   Assessment & Plan    NSTEMI CAD -- hsTn peaked at 7294, underwent cardiac cath noted above with multivessel CAD, seen by TCTS with initially plans for CABG pending renal function. This is now on hold as renal function worsened. Now CABG vs PCI, Cr improved this morning. Will review plans with MD. -- continue IV heparin, ASA, statin, coreg 12.5mg  BID, amlodipine 10mg  daily  ICM HFrEF -- LVEF 35-40%, global hypokinesis with severe HK in basal-inferior wall -- he is net + 9.5L pedal/hand edema, but received IVF in the setting of AKI. Will defer management to nephrology -- continue coreg, hydralazine. GDMT limited with AKI/CKD  Penetrating abd aortic ulcer -- Mural thrombus of abdominal aorta with small penetrating ulcer in the infrarenal abdominal aorta measuring 3 mm with no mural hematoma.  -- Evaluated by VVS with recommendations for pain management and blood pressure control, repeat scan 4 to 6 weeks as long as no recurrent pain.   HTN -- severely hypertensive on admission, requiring esmolol, cleviprex drips -- BP now improved -- continue coreg 12.5mg  BID, will reduce hydralazine to 50mg  TID now that IV nitro has been resumed   AKI -- in the setting of acute illness/hypertensive emergency/contrast -- peak Cr 3.3, now improved to 2.5 this morning -- nephrology following   Abd pain GERD -- Lipase 24, CT abdomen on admission showed no abdominal pathology except abdominal aortic ulcer -- seen by GI, Continue PPI.  Tobacco use -- cessation  For questions or updates, please contact [place] Please consult [email or web address] for contact info under        Signed, Reino Bellis, NP  01/12/2022, 9:51 AM    I have examined the patient and reviewed assessment and plan and  discussed with patient.  Agree with above as stated.  Patient sleepy when I saw him.  He would respond appropriately to yes/no questions but did not participate with more detailed questions.  Review of CT surgery notes shows that there was a significant concern of worsening renal function if bypass surgery was performed.  They did want the interventional cardiology team to consider PCI of the RCA.  I personally reviewed the pictures.  The mid RCA lesion is focal.  I think it would be amenable to PCI.  Creatinine has improved.  Hopefully, we will see continued improvement in renal function.  Will discuss with one of the cathing doctors  about performing PCI of the RCA using minimal contrast.  Larae Grooms   Addendum: spoke to patient about PCI of RCA and medical therapy of other disease They are in agreement.  Tentaively plan for tomorrow based on renal function.  Jettie Booze, MD

## 2022-01-12 NOTE — Progress Notes (Signed)
ANTICOAGULATION CONSULT NOTE-Follow Up  Pharmacy Consult for heparin  Indication: chest pain/ACS  Allergies  Allergen Reactions   Augmentin [Amoxicillin-Pot Clavulanate] Hives and Itching    Has patient had a PCN reaction causing immediate rash, facial/tongue/throat swelling, SOB or lightheadedness with hypotension: Yes Has patient had a PCN reaction causing severe rash involving mucus membranes or skin necrosis: No Has patient had a PCN reaction that required hospitalization: No Has patient had a PCN reaction occurring within the last 10 years: No If all of the above answers are "NO", then may proceed with Cephalosporin use.   Contrast Media [Iodinated Contrast Media] Hives    Pt broke out with 1 hive on forehead after CT injection-treated with 25mg  Benadryl    Patient Measurements: Height: 5\' 11"  (180.3 cm) Weight: 117.3 kg (258 lb 9.6 oz) IBW/kg (Calculated) : 75.3 Heparin Dosing Weight: 99.5 kg  Vital Signs: Temp: 98.6 F (37 C) (12/18 0350) Temp Source: Oral (12/18 0350) BP: 120/64 (12/18 0350) Pulse Rate: 75 (12/18 0350)  Labs: Recent Labs    01/10/22 0949 01/10/22 1459 01/11/22 0141 01/12/22 0230  HGB 10.1*  --  8.9* 9.2*  HCT 28.0*  --  25.6* 25.7*  PLT 315  --  312 330  HEPARINUNFRC  --  0.39 0.54 0.68  CREATININE 3.31*  --  3.02* 2.58*  TROPONINIHS  --   --   --  448*     Estimated Creatinine Clearance: 47.6 mL/min (A) (by C-G formula based on SCr of 2.58 mg/dL (H)).   Medical History: Past Medical History:  Diagnosis Date   Chest pain    HTN (hypertension)    Hyperlipidemia    Lesion of penis    foreskin   OSA (obstructive sleep apnea)    per pt dx osa and used cpap but after losing wt. from 415 pounds down to 244 pounds no longer needs cpap   Peripheral neuropathy    Phimosis    Scrotal abscess 05/08/2017   Type 2 diabetes mellitus ([place])    per pt no meds for two years pt was changed to levemir unable to afford but pt states is waiting on  approval for a program he applied for that will pay for his meds ([place])      Assessment: 46 yo M with c/f NSTEMI with elevated troponins and EKG changes. No AC PTA. 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   Heparin level therapeutic at 0.6 while on rate of 2400 units/hr. Plts have remained within normal; limits, Hgb stable 9-10 no issues with the infusion running or signs/symptoms of bleeding.  Goal of Therapy:  Heparin level 0.3-0.7 units/ml Monitor platelets by anticoagulation protocol: Yes   Plan:  Continue heparin gtt at 2400 units/hr Daily heparin level, CBC, s/s bleeding    Bonnita Nasuti Pharm.D. CPP, BCPS Clinical Pharmacist [phone] 01/12/2022 9:05 AM

## 2022-01-12 NOTE — Progress Notes (Signed)
Pt having c/o R-chest pain w/ radiation to back that has not improved w/ PRN analgesic. Nitro gtt initiated w/ initial improvement in CP but pt then started to have c/o chest and jaw tightness w/ SOB. Pt still somnolent and only whispering in brief sentences, so thorough feedback regarding symptoms difficult to obtain. Shortly after complaints, pt fell back asleep. VSS and does not appear in acute distress. Cardiology paged and hospitalist notified. Orders given to continue to monitor and obtain EKG if CP returns.

## 2022-01-12 NOTE — Progress Notes (Signed)
Mobility Specialist - Progress Note   01/12/22 0926  Mobility  Activity Refused mobility   Pt refused mobility stating that he had a long night and wanted to rest more. Will follow up if time permits.   Franki Monte  Mobility Specialist Please contact via Solicitor or Rehab office at [phone]

## 2022-01-12 NOTE — Progress Notes (Signed)
Mobility Specialist - Progress Note   01/12/22 1502  Mobility  Activity Refused mobility  Mobility Referral No   Pt refused mobility stating he was tired. Will continue to follow.  Franki Monte  Mobility Specialist Please contact via Solicitor or Rehab office at [phone]

## 2022-01-12 NOTE — Progress Notes (Signed)
Consultation Progress Note   Patient: Shaun Cummings IFO:[id number] DOB: 04-10-1975 DOA: 01/04/2022 DOS: the patient was seen and examined on 01/12/2022 Primary service: Saleena Tamas, Manfred Shirts, MD  Brief hospital course: Patient is a 46 year old male with HTN, HLD, OSA, diabetes mellitus type 2 presented with chest pain.  In ED, BP noted to be 206/111, troponin 19 CTA chest abdomen pelvis showed small penetrating ulcer in the infrarenal abdominal aorta measuring 3 mm, no mural hematoma.  No evidence of aortic aneurysm or dissection.  Creatinine 1.9 Vascular surgery was consulted and recommended medical management.  Patient was placed on esmolol drip and was admitted to ICU by PCCM.   Transferred to the floor and TRH assumed care on 01/07/2022  Significant Hospital Events:  12/10 presented to [facility]. CTA chest abd pelvis-  CTA chest abdomen pelvis with mall 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, VVS consult, PCCM consult 12/11 Denies pain today. Foot numbness better than a month ago, not worse. Echo with LVEF 35-40% with hypokinesis of the LV septal, anterior, & lateral walls. Severe hypokinesis oif the LV basal mid inferior wall. G1DD. LA moderately dilated, RA mildly dilated. Normal RV function.  12/12 LHC > severe multivessel disease, surgical consultation recommended    Assessment and Plan: Principal Problem:   Hypertensive emergency -Off esmolol drip, on labetalol PRN with parameters -BP controlled, continue Coreg, amlodipine, hydralazine    Active Problems: Severe multivessel coronary disease, NSTEMI Acute HFrEF -Cardiology was consulted, underwent 2D echo which showed EF of 35 to 40% with hypokinesis of LV septal, anterior and lateral walls.  Severe hypokinesis of the LV basal mid inferior wall, G1 DD. -Underwent cardiac cath which showed severe multivessel disease, cardiology, TCTS -Plans for any intervention currently on hold due to worsening of the renal  function. -Management per cardiology and CTVS - has been having off-and-on chest pain episodes, resolved with sublingual nitro, may need nitro drip, defer to cardiology.     AKI (acute kidney injury) ([place]) -Creatinine improved to 2.58 -UA negative for UTI, renal ultrasound with no obstructive uropathy.  AKI likely due to contrast nephropathy. -Nephrology following, continue IV fluid hydration, supportive treatment      Penetrating abdominal aortic ulcer -Initially presented with abdominal/back pain, found to have mural thrombus of abdominal aorta with small penetrating ulcer and infrarenal abdominal aorta measuring 3 mm with no medial hematoma -Evaluated by vascular surgery, recommended pain management, BP control  -Abdominal pain has improved   Acute abdominal pain, history of GERD -Abdominal pain is now resolved -Lipase 24, recent CT abdomen showed no abdominal pathology except abdominal aortic ulcer -Abd XR 12/13 showed nonobstructive bowel gas pattern with moderate volume of stool within the colon, placed on bowel regimen -Continue PPI. GI signed off.     Diabetes mellitus type 2, uncontrolled with hyperglycemia Hemoglobin A1c 11.5 -Continue Semglee 20 units daily, NovoLog 5 units 3 times daily AC, SSI   Recent Labs (last 2 labs)          Recent Labs    01/10/22 1937 01/11/22 0009 01/11/22 0427 01/11/22 0833 01/11/22 1120 01/11/22 1227  GLUCAP 122* 133* 129* 128* 192* 191*        OSA -Continue CPAP nightly     GERD -Continue PPI   Obesity  Estimated body mass index is 35.85 kg/m as calculated from the following:   Height as of this encounter: 5\' 11"  (1.803 m).   Weight as of this encounter: 116.6 kg.   Code Status:  Full code DVT Prophylaxis:  SCD's Start: 01/06/22 1235 SCDs Start: 01/04/22 2332     Level of Care: Level of care: Progressive Family Communication: Updated patient's wife at the bedside today   Disposition Plan:      Remains inpatient  appropriate:       Procedures:  Cardiac cath Consultants:   Vascular surgery Was admitted by CCM Cardiac thoracic surgery GI       TRH will continue to follow the patient.  Subjective: No complaints this morning  Physical Exam: Vitals:   01/12/22 0350 01/12/22 0748 01/12/22 0848 01/12/22 0948  BP: 120/64 124/70 (!) 110/92 133/75  Pulse: 75 71  75  Resp: 18     Temp: 98.6 F (37 C)     TempSrc: Oral     SpO2:  95%  90%  Weight: 117.3 kg     Height:       General: Alert and oriented x 3, NAD Cardiovascular: S1 S2 clear, RRR.  Respiratory: CTAB, no wheezing, rales or rhonchi Gastrointestinal: Soft, nontender, nondistended, NBS Ext: no pedal edema bilaterally Psych: Normal affect    Data Reviewed:  There are no new results to review at this time.  Family Communication: None at bedside  Time spent: 15 minutes.  Author: Cristela Felt, MD 01/12/2022 11:20 AM  For on call review [email or web address].

## 2022-01-13 ENCOUNTER — Other Ambulatory Visit (HOSPITAL_COMMUNITY): Payer: Self-pay

## 2022-01-13 ENCOUNTER — Encounter (HOSPITAL_COMMUNITY)
Admission: EM | Disposition: A | Discharge status: Home / Self Care | Payer: Self-pay | Source: Home / Self Care | Attending: Internal Medicine

## 2022-01-13 DIAGNOSIS — I25110 Atherosclerotic heart disease of native coronary artery with unstable angina pectoris: Secondary | ICD-10-CM | POA: Diagnosis not present

## 2022-01-13 DIAGNOSIS — N183 Chronic kidney disease, stage 3 unspecified: Secondary | ICD-10-CM

## 2022-01-13 DIAGNOSIS — R7989 Other specified abnormal findings of blood chemistry: Secondary | ICD-10-CM | POA: Diagnosis not present

## 2022-01-13 DIAGNOSIS — I161 Hypertensive emergency: Secondary | ICD-10-CM | POA: Diagnosis not present

## 2022-01-13 LAB — CBC
HCT: 23.7 % — ABNORMAL LOW (ref 39.0–52.0)
Hemoglobin: 8.3 g/dL — ABNORMAL LOW (ref 13.0–17.0)
MCH: 29.3 pg (ref 26.0–34.0)
MCHC: 35 g/dL (ref 30.0–36.0)
MCV: 83.7 fL (ref 80.0–100.0)
Platelets: 337 10*3/uL (ref 150–400)
RBC: 2.83 MIL/uL — ABNORMAL LOW (ref 4.22–5.81)
RDW: 13.1 % (ref 11.5–15.5)
WBC: 19.2 10*3/uL — ABNORMAL HIGH (ref 4.0–10.5)
nRBC: 0 % (ref 0.0–0.2)

## 2022-01-13 LAB — COMPREHENSIVE METABOLIC PANEL
ALT: 34 U/L (ref 0–44)
AST: 31 U/L (ref 15–41)
Albumin: 1.7 g/dL — ABNORMAL LOW (ref 3.5–5.0)
Alkaline Phosphatase: 229 U/L — ABNORMAL HIGH (ref 38–126)
Anion gap: 8 (ref 5–15)
BUN: 43 mg/dL — ABNORMAL HIGH (ref 6–20)
CO2: 23 mmol/L (ref 22–32)
Calcium: 7.3 mg/dL — ABNORMAL LOW (ref 8.9–10.3)
Chloride: 97 mmol/L — ABNORMAL LOW (ref 98–111)
Creatinine, Ser: 2.38 mg/dL — ABNORMAL HIGH (ref 0.61–1.24)
GFR, Estimated: 34 mL/min — ABNORMAL LOW (ref >60)
Glucose, Bld: 187 mg/dL — ABNORMAL HIGH (ref 70–99)
Potassium: 3.2 mmol/L — ABNORMAL LOW (ref 3.5–5.1)
Sodium: 128 mmol/L — ABNORMAL LOW (ref 135–145)
Total Bilirubin: 0.5 mg/dL (ref 0.3–1.2)
Total Protein: 5.3 g/dL — ABNORMAL LOW (ref 6.5–8.1)

## 2022-01-13 LAB — GLUCOSE, CAPILLARY
Glucose-Capillary: 156 mg/dL — ABNORMAL HIGH (ref 70–99)
Glucose-Capillary: 166 mg/dL — ABNORMAL HIGH (ref 70–99)
Glucose-Capillary: 143 mg/dL — ABNORMAL HIGH (ref 70–99)
Glucose-Capillary: 185 mg/dL — ABNORMAL HIGH (ref 70–99)

## 2022-01-13 LAB — HEPARIN LEVEL (UNFRACTIONATED): Heparin Unfractionated: 0.47 [IU]/mL (ref 0.30–0.70)

## 2022-01-13 SURGERY — CORONARY ARTERY BYPASS GRAFTING (CABG)
Anesthesia: General | Site: Chest

## 2022-01-13 MED ORDER — POTASSIUM CHLORIDE CRYS ER 20 MEQ PO TBCR
40.0000 meq | EXTENDED_RELEASE_TABLET | Freq: Once | ORAL | Status: AC
  Administered 2022-01-13: 40 meq via ORAL
  Filled 2022-01-13: qty 2

## 2022-01-13 MED ORDER — TICAGRELOR 90 MG PO TABS: 180.0000 mg | ORAL_TABLET | Freq: Once | ORAL | Status: DC

## 2022-01-13 MED ORDER — TICAGRELOR 90 MG PO TABS: 90.0000 mg | ORAL_TABLET | Freq: Two times a day (BID) | ORAL | Status: DC

## 2022-01-13 MED ORDER — CLOPIDOGREL BISULFATE 75 MG PO TABS
75.0000 mg | ORAL_TABLET | Freq: Every day | ORAL | Status: DC
  Administered 2022-01-14 – 2022-01-16 (×3): 75 mg via ORAL
  Filled 2022-01-13 (×3): qty 1

## 2022-01-13 MED ORDER — CLOPIDOGREL BISULFATE 75 MG PO TABS
300.0000 mg | ORAL_TABLET | Freq: Once | ORAL | Status: AC
  Administered 2022-01-13: 300 mg via ORAL
  Filled 2022-01-13: qty 4

## 2022-01-13 MED ORDER — ISOSORBIDE MONONITRATE ER 30 MG PO TB24
30.0000 mg | ORAL_TABLET | Freq: Every day | ORAL | Status: DC
  Administered 2022-01-13 – 2022-01-16 (×4): 30 mg via ORAL
  Filled 2022-01-13 (×4): qty 1

## 2022-01-13 NOTE — Plan of Care (Signed)
  Problem: Education: Goal: Ability to describe self-care measures that may prevent or decrease complications (Diabetes Survival Skills Education) will improve Outcome: Progressing Goal: Individualized Educational Video(s) Outcome: Progressing   Problem: Coping: Goal: Ability to adjust to condition or change in health will improve Outcome: Progressing   Problem: Fluid Volume: Goal: Ability to maintain a balanced intake and output will improve Outcome: Progressing   Problem: Health Behavior/Discharge Planning: Goal: Ability to identify and utilize available resources and services will improve Outcome: Progressing Goal: Ability to manage health-related needs will improve Outcome: Progressing   Problem: Metabolic: Goal: Ability to maintain appropriate glucose levels will improve Outcome: Progressing   Problem: Nutritional: Goal: Maintenance of adequate nutrition will improve Outcome: Progressing Goal: Progress toward achieving an optimal weight will improve Outcome: Progressing   Problem: Skin Integrity: Goal: Risk for impaired skin integrity will decrease Outcome: Progressing   Problem: Tissue Perfusion: Goal: Adequacy of tissue perfusion will improve Outcome: Progressing   Problem: Education: Goal: Knowledge of General Education information will improve Description: Including pain rating scale, medication(s)/side effects and non-pharmacologic comfort measures Outcome: Progressing   Problem: Health Behavior/Discharge Planning: Goal: Ability to manage health-related needs will improve Outcome: Progressing   Problem: Clinical Measurements: Goal: Ability to maintain clinical measurements within normal limits will improve Outcome: Progressing Goal: Will remain free from infection Outcome: Progressing Goal: Diagnostic test results will improve Outcome: Progressing Goal: Respiratory complications will improve Outcome: Progressing Goal: Cardiovascular complication will  be avoided Outcome: Progressing   Problem: Activity: Goal: Risk for activity intolerance will decrease Outcome: Progressing   Problem: Nutrition: Goal: Adequate nutrition will be maintained Outcome: Progressing   Problem: Coping: Goal: Level of anxiety will decrease Outcome: Progressing   Problem: Elimination: Goal: Will not experience complications related to bowel motility Outcome: Progressing Goal: Will not experience complications related to urinary retention Outcome: Progressing   Problem: Pain Managment: Goal: General experience of comfort will improve Outcome: Progressing   Problem: Safety: Goal: Ability to remain free from injury will improve Outcome: Progressing   Problem: Skin Integrity: Goal: Risk for impaired skin integrity will decrease Outcome: Progressing   Problem: Education: Goal: Understanding of CV disease, CV risk reduction, and recovery process will improve Outcome: Progressing Goal: Individualized Educational Video(s) Outcome: Progressing   Problem: Activity: Goal: Ability to return to baseline activity level will improve Outcome: Progressing   Problem: Cardiovascular: Goal: Ability to achieve and maintain adequate cardiovascular perfusion will improve Outcome: Progressing Goal: Vascular access site(s) Level 0-1 will be maintained Outcome: Progressing   Problem: Health Behavior/Discharge Planning: Goal: Ability to safely manage health-related needs after discharge will improve Outcome: Progressing   Problem: Education: Goal: Individualized Educational Video(s) Outcome: Progressing   Problem: Education: Goal: Knowledge of cardiac device and self-care will improve Outcome: Progressing Goal: Ability to safely manage health related needs after discharge will improve Outcome: Progressing Goal: Individualized Educational Video(s) Outcome: Progressing   Problem: Cardiac: Goal: Ability to achieve and maintain adequate cardiopulmonary  perfusion will improve Outcome: Progressing

## 2022-01-13 NOTE — Progress Notes (Signed)
ANTICOAGULATION CONSULT NOTE-Follow Up  Pharmacy Consult for heparin  Indication: chest pain/ACS  Allergies  Allergen Reactions   Augmentin [Amoxicillin-Pot Clavulanate] Hives and Itching    Has patient had a PCN reaction causing immediate rash, facial/tongue/throat swelling, SOB or lightheadedness with hypotension: Yes Has patient had a PCN reaction causing severe rash involving mucus membranes or skin necrosis: No Has patient had a PCN reaction that required hospitalization: No Has patient had a PCN reaction occurring within the last 10 years: No If all of the above answers are "NO", then may proceed with Cephalosporin use.   Contrast Media [Iodinated Contrast Media] Hives    Pt broke out with 1 hive on forehead after CT injection-treated with 25mg  Benadryl    Patient Measurements: Height: 5\' 11"  (180.3 cm) Weight: 116.8 kg (257 lb 9.6 oz) IBW/kg (Calculated) : 75.3 Heparin Dosing Weight: 99.5 kg  Vital Signs: Temp: 98.4 F (36.9 C) (12/19 0616) BP: 132/77 (12/19 0836) Pulse Rate: 72 (12/19 0836)  Labs: Recent Labs    01/11/22 0141 01/12/22 0230 01/13/22 0158  HGB 8.9* 9.2* 8.3*  HCT 25.6* 25.7* 23.7*  PLT 312 330 337  HEPARINUNFRC 0.54 0.68 0.47  CREATININE 3.02* 2.58* 2.38*  TROPONINIHS  --  448*  --      Estimated Creatinine Clearance: 51.5 mL/min (A) (by C-G formula based on SCr of 2.38 mg/dL (H)).   Medical History: Past Medical History:  Diagnosis Date   Chest pain    HTN (hypertension)    Hyperlipidemia    Lesion of penis    foreskin   OSA (obstructive sleep apnea)    per pt dx osa and used cpap but after losing wt. from 415 pounds down to 244 pounds no longer needs cpap   Peripheral neuropathy    Phimosis    Scrotal abscess 05/08/2017   Type 2 diabetes mellitus ([place])    per pt no meds for two years pt was changed to levemir unable to afford but pt states is waiting on approval for a program he applied for that will pay for his meds ([place])      Assessment: 46 yo M with c/f NSTEMI with elevated troponins and EKG changes. No AC PTA. 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   Heparin level therapeutic at 0.47, CBC stable. Planning staged PCI at some point.  Goal of Therapy:  Heparin level 0.3-0.7 units/ml Monitor platelets by anticoagulation protocol: Yes   Plan:  Continue heparin gtt at 2400 units/hr Daily heparin level, CBC, s/s bleeding  Arrie Senate, PharmD, BCPS, [facility] Clinical Pharmacist [phone] Please check AMION for all [place] numbers 01/13/2022

## 2022-01-13 NOTE — Progress Notes (Signed)
Consultation Progress Note   Patient: Shaun Cummings UVO:[id number] DOB: 1975/03/28 DOA: 01/04/2022 DOS: the patient was seen and examined on 01/13/2022 Primary service: Korbin Notaro, Manfred Shirts, MD  Brief hospital course: Patient is a 46 year old male with HTN, HLD, OSA, diabetes mellitus type 2 presented with chest pain.  In ED, BP noted to be 206/111, troponin 19 CTA chest abdomen pelvis showed small penetrating ulcer in the infrarenal abdominal aorta measuring 3 mm, no mural hematoma.  No evidence of aortic aneurysm or dissection.  Creatinine 1.9 Vascular surgery was consulted and recommended medical management.  Patient was placed on esmolol drip and was admitted to ICU by PCCM.   Transferred to the floor and TRH assumed care on 01/07/2022  Assessment and Plan: Principal Problem:   Hypertensive emergency -Off esmolol drip, on labetalol PRN with parameters -BP controlled, continue Coreg, amlodipine, hydralazine    Active Problems: Severe multivessel coronary disease, NSTEMI Acute HFrEF -Cardiology was consulted, underwent 2D echo which showed EF of 35 to 40% with hypokinesis of LV septal, anterior and lateral walls.  Severe hypokinesis of the LV basal mid inferior wall, G1 DD. -Underwent cardiac cath which showed severe multivessel disease, cardiology, TCTS -Plans for any intervention currently on hold due to worsening of the renal function. -Management per cardiology and CTVS - has been having off-and-on chest pain episodes. Required Nitro drip which has been dced today.     AKI (acute kidney injury) ([place]) -Creatinine improved to 2.38 -UA negative for UTI, renal ultrasound with no obstructive uropathy.  AKI likely due to contrast nephropathy. -Nephrology following, continue IV fluid hydration, supportive treatment      Penetrating abdominal aortic ulcer -Initially presented with abdominal/back pain, found to have mural thrombus of abdominal aorta with small penetrating ulcer and  infrarenal abdominal aorta measuring 3 mm with no medial hematoma -Evaluated by vascular surgery, recommended pain management, BP control  -Abdominal pain has improved   Acute abdominal pain, history of GERD -Abdominal pain is now resolved -Lipase 24, recent CT abdomen showed no abdominal pathology except abdominal aortic ulcer -Abd XR 12/13 showed nonobstructive bowel gas pattern with moderate volume of stool within the colon, placed on bowel regimen -Continue PPI. GI signed off.     Diabetes mellitus type 2, uncontrolled with hyperglycemia Hemoglobin A1c 11.5 -Continue Semglee 20 units daily, NovoLog 5 units 3 times daily AC, SSI   Recent Labs (last 2 labs)                 Recent Labs    01/10/22 1937 01/11/22 0009 01/11/22 0427 01/11/22 0833 01/11/22 1120 01/11/22 1227  GLUCAP 122* 133* 129* 128* 192* 191*        OSA -Continue CPAP nightly     GERD -Continue PPI   Obesity  Estimated body mass index is 35.85 kg/m as calculated from the following:   Height as of this encounter: 5\' 11"  (1.803 m).   Weight as of this encounter: 116.6 kg.   Code Status: Full code DVT Prophylaxis:  SCD's Start: 01/06/22 1235 SCDs Start: 01/04/22 2332     Level of Care: Level of care: Progressive Family Communication: Updated patient's wife at the bedside today   Disposition Plan:      Remains inpatient appropriate:       Procedures:  Cardiac cath Consultants:   Vascular surgery Was admitted by CCM Cardiac thoracic surgery       TRH will continue to follow the patient.  Subjective: Feels better, Denies chest pains. Eating  breakfast. Wife at bedside  Physical Exam: Vitals:   01/12/22 1800 01/12/22 2111 01/13/22 0616 01/13/22 0836  BP: (!) 116/59 115/71 136/76 132/77  Pulse: 72 69 70 72  Resp:  18    Temp:  97.6 F (36.4 C) 98.4 F (36.9 C)   TempSrc:  Oral    SpO2: 96% 96% 95% 96%  Weight:   116.8 kg   Height:      General: Alert and oriented x 3,  NAD Cardiovascular: S1 S2 clear, RRR.  Respiratory: CTAB, no wheezing, rales or rhonchi Gastrointestinal: Soft, nontender, nondistended, NBS Ext: no pedal edema bilaterally Psych: Normal affect   Data Reviewed:  There are no new results to review at this time.  Family Communication: Wife at bedside  Time spent: 15 minutes.  Author: Cristela Felt, MD 01/13/2022 12:30 PM  For on call review [email or web address].

## 2022-01-13 NOTE — Progress Notes (Signed)
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; doubt additional GN present Most recent UA without hematuria Renal ultrasound reassuring with findings consistent with medical renal disease Acute insult almost certainly is contrast and acute illness /hypertensive emergency Cr downtrending, would like to see more eGFR recovery before invasive procedures if possible--> possible PCI tomorrow NSTEMI with LHC showing multivessel disease with plan for CABG vs PCI both on hold pending GFR recovery Hypertensive emergency at admission, improved on oral agents Penetrating aortic ulcer followed by vascular surgery Abdominal pain, question if related #4, GI eval'd; resolved in time Metabolic acidosis stable Mild hyponatremia, monitor Leukocytosis, stable Normocytic anemia Recent tobacco cessation OSA Hyperlipidemia    Subjective:    Cr continues to trend down off IVFs.  No CP, off nitro gtt.  Wife at bedside    Objective:   BP 134/76 (BP Location: Left Arm)   Pulse 74   Temp 97.8 F (36.6 C) (Oral)   Resp 18   Ht _0  (1.803 m)   Wt 116.8 kg   SpO2 98%   BMI 35.93 kg/m   Physical Exam: Gen:NAD, lying in bed CVS: RRR Resp: clear Abd: soft Ext: no LE edema  Labs: BMET Recent Labs  Lab 01/07/22 1509 01/08/22 0207 01/09/22 0207 01/10/22 0949 01/11/22 0141 01/12/22 0230 01/13/22 0158  NA 133* 131* 128* 128* 129* 128* 128*  K 3.6 3.5 3.1* 3.2* 3.4* 3.6 3.2*  CL 105 102 94* 94* 94* 96* 97*  CO2 19* 18* 22 23 21* 22 23  GLUCOSE 165* 170* 162* 212* 140* 137* 187*  BUN 40* 41* 51* 57* 58* 51* 43*  CREATININE 2.42* 2.46* 3.01* 3.31* 3.02* 2.58* 2.38*  CALCIUM 8.2* 8.0* 7.4* 7.5* 7.3* 7.3* 7.3*   CBC Recent Labs  Lab 01/07/22 1008  01/08/22 0207 01/10/22 0949 01/11/22 0141 01/12/22 0230 01/13/22 0158  WBC 22.0*   < > 20.0* 20.8* 20.1* 19.2*  NEUTROABS 18.9*  --   --   --   --   --   HGB 10.4*   < > 10.1* 8.9* 9.2* 8.3*  HCT 29.2*   < > 28.0* 25.6* 25.7* 23.7*  MCV 83.7   < > 83.8 85.0 83.7 83.7  PLT 295   < > 315 312 330 337   < > = values in this interval not displayed.      Medications:     aspirin  81 mg Oral Daily   atorvastatin  80 mg Oral Daily   carvedilol  12.5 mg Oral BID WC   [START ON 01/14/2022] clopidogrel  75 mg Oral Daily   docusate sodium  100 mg Oral BID   hydrALAZINE  50 mg Oral Q8H   insulin aspart  0-15 Units Subcutaneous TID WC   insulin aspart  0-5 Units Subcutaneous QHS   insulin aspart  5 Units Subcutaneous TID WC   insulin glargine-yfgn  20 Units Subcutaneous Daily   isosorbide mononitrate  30 mg Oral Daily   lactulose  10 g Oral Daily   pantoprazole  40 mg Oral Q12H   sodium chloride flush  3 mL Intravenous Q12H   sodium chloride flush  3 mL Intravenous Q12H     Madelon Lips, MD 01/13/2022, 4:01 PM

## 2022-01-13 NOTE — Progress Notes (Addendum)
Rounding Note    Patient Name: Shaun Cummings Date of Encounter: 01/13/2022  [place] Cardiologist: Freada Bergeron, MD   Subjective   No further chest pain. IV nitro has been stopped.   Inpatient Medications    Scheduled Meds:  amLODipine  10 mg Oral Daily   aspirin  81 mg Oral Daily   atorvastatin  80 mg Oral Daily   carvedilol  12.5 mg Oral BID WC   docusate sodium  100 mg Oral BID   hydrALAZINE  50 mg Oral Q8H   insulin aspart  0-15 Units Subcutaneous TID WC   insulin aspart  0-5 Units Subcutaneous QHS   insulin aspart  5 Units Subcutaneous TID WC   insulin glargine-yfgn  20 Units Subcutaneous Daily   lactulose  10 g Oral Daily   pantoprazole  40 mg Oral Q12H   sodium chloride flush  3 mL Intravenous Q12H   sodium chloride flush  3 mL Intravenous Q12H   Continuous Infusions:  sodium chloride     sodium chloride 10 mL/hr at 01/08/22 2359   sodium chloride     diphenhydrAMINE     heparin 2,400 Units/hr (01/13/22 0036)   nitroGLYCERIN Stopped (01/12/22 1241)   PRN Meds: sodium chloride, acetaminophen, acetaminophen, diazepam, diphenhydrAMINE, HYDROmorphone (DILAUDID) injection, naLOXone (NARCAN)  injection, ondansetron (ZOFRAN) IV, mouth rinse, polyethylene glycol, sodium chloride flush   Vital Signs    Vitals:   01/12/22 1800 01/12/22 2111 01/13/22 0616 01/13/22 0836  BP: (!) 116/59 115/71 136/76 132/77  Pulse: 72 69 70 72  Resp:  18    Temp:  97.6 F (36.4 C) 98.4 F (36.9 C)   TempSrc:  Oral    SpO2: 96% 96% 95%   Weight:   116.8 kg   Height:        Intake/Output Summary (Last 24 hours) at 01/13/2022 1005 Last data filed at 01/12/2022 1731 Gross per 24 hour  Intake 127.87 ml  Output 600 ml  Net -472.13 ml      01/13/2022    6:16 AM 01/12/2022    3:50 AM 01/11/2022    5:00 AM  Last 3 Weights  Weight (lbs) 257 lb 9.6 oz 258 lb 9.6 oz 257 lb 0.9 oz  Weight (kg) 116.847 kg 117.3 kg 116.6 kg      Telemetry    Sinus Rhythm  - Personally Reviewed  ECG    No new tracing  Physical Exam   GEN: No acute distress.   Neck: No JVD Cardiac: RRR, no murmurs, rubs, or gallops.  Respiratory: Clear to auscultation bilaterally. GI: Soft, nontender, non-distended  MS: pedal/hand edema; No deformity. Neuro:  Nonfocal  Psych: Normal affect   Labs    High Sensitivity Troponin:   Recent Labs  Lab 01/04/22 2209 01/05/22 0321 01/05/22 0654 01/05/22 0910 01/12/22 0230  TROPONINIHS 22* 1,719* 4,399* 7,294* 448*     Chemistry Recent Labs  Lab 01/11/22 0141 01/12/22 0230 01/13/22 0158  NA 129* 128* 128*  K 3.4* 3.6 3.2*  CL 94* 96* 97*  CO2 21* 22 23  GLUCOSE 140* 137* 187*  BUN 58* 51* 43*  CREATININE 3.02* 2.58* 2.38*  CALCIUM 7.3* 7.3* 7.3*  PROT 5.4* 5.6* 5.3*  ALBUMIN 1.9* 1.9* 1.7*  AST _0 ALT 25 31 34  ALKPHOS 160* 197* 229*  BILITOT 0.6 0.8 0.5  GFRNONAA 25* 31* 34*  ANIONGAP _1 Lipids No results for input(s): "CHOL", "TRIG", "  HDL", "LABVLDL", "[place]", "CHOLHDL" in the last 168 hours.  Hematology Recent Labs  Lab 01/11/22 0141 01/12/22 0230 01/13/22 0158  WBC 20.8* 20.1* 19.2*  RBC 3.01* 3.07* 2.83*  HGB 8.9* 9.2* 8.3*  HCT 25.6* 25.7* 23.7*  MCV 85.0 83.7 83.7  MCH 29.6 30.0 29.3  MCHC 34.8 35.8 35.0  RDW 13.2 13.2 13.1  PLT 312 330 337   Thyroid No results for input(s): "TSH", "FREET4" in the last 168 hours.  BNPNo results for input(s): "BNP", "PROBNP" in the last 168 hours.  DDimer No results for input(s): "DDIMER" in the last 168 hours.   Radiology    No results found.  Cardiac Studies   Cath: 01/06/2022     Dist LAD lesion is 50% stenosed.   Prox LAD lesion is 70% stenosed.   Ost RCA to Prox RCA lesion is 75% stenosed.   Prox RCA lesion is 75% stenosed.   Prox RCA to Mid RCA lesion is 95% stenosed.   Mid RCA lesion is 50% stenosed.   1st Mrg lesion is 50% stenosed.   Mid Cx-2 lesion is 70% stenosed.   2nd Mrg lesion is 95% stenosed.   Mid  Cx-1 lesion is 50% stenosed.   Severe multivessel CAD with the dominant RCA being the "culprit" vessel contributing to the non-STEMI with 70% diffuse proximal stenosis followed by 70% and 95% thrombotic mid stenosis and 50% distal stenosis.   There is 70% diffuse proximal LAD stenosis with diffuse 50% distal LAD stenosis.   The left circumflex vessel has diffuse 50% stenosis in the small OM 2 vessel with 95% focal stenosis in the OM 3 vessel and bifurcation AV groove 50% stenosis followed by 70% distal stenosis.   LVEDP elevated at 27 mm.   RECOMMENDATION: Surgical consultation will be recommended for CABG revascularization.  Will resume heparin 8 hours post sheath discontinuance.  Initiate medical therapy for significant CAD and aggressive lipid-lowering therapy with target LDL less than 55.  Smoking cessation is essential.     Diagnostic Dominance: Right  Echo: 01/05/2022   IMPRESSIONS     1. Left ventricular ejection fraction, by estimation, is 35 to 40%. The  left ventricle has moderately decreased function. The left ventricle  demonstrates regional wall motion abnormalities (see scoring  diagram/findings for description). The left  ventricular internal cavity size was mildly dilated. Left ventricular  diastolic parameters are consistent with Grade I diastolic dysfunction  (impaired relaxation). There is hypokinesis of the left ventricular,  septal wall, anterior wall and lateral wall.   There is severe hypokinesis of the left ventricular, basal-mid inferior  wall.   2. Right ventricular systolic function is normal. The right ventricular  size is normal.   3. Left atrial size was moderately dilated.   4. Right atrial size was mildly dilated.   5. The mitral valve is normal in structure. Trivial mitral valve  regurgitation. No evidence of mitral stenosis.   6. The aortic valve is grossly normal. Aortic valve regurgitation is not  visualized. No aortic stenosis is present.    7. The inferior vena cava is normal in size with greater than 50%  respiratory variability, suggesting right atrial pressure of 3 mmHg.   Comparison(s): No prior Echocardiogram.   Conclusion(s)/Recommendation(s): Reduced LVEF, with global hypokinesis but  also severe focal hypokinesis of basal to mid inferior wall. Results will  be communicated to primary team.   FINDINGS   Left Ventricle: Left ventricular ejection fraction, by estimation, is 35  to  40%. The left ventricle has moderately decreased function. The left  ventricle demonstrates regional wall motion abnormalities. Severe  hypokinesis of the left ventricular,  basal-mid inferior wall. The left ventricular internal cavity size was  mildly dilated. There is borderline left ventricular hypertrophy. Left  ventricular diastolic parameters are consistent with Grade I diastolic  dysfunction (impaired relaxation).   Right Ventricle: The right ventricular size is normal. No increase in  right ventricular wall thickness. Right ventricular systolic function is  normal.   Left Atrium: Left atrial size was moderately dilated.   Right Atrium: Right atrial size was mildly dilated.   Pericardium: There is no evidence of pericardial effusion.   Mitral Valve: The mitral valve is normal in structure. Trivial mitral  valve regurgitation. No evidence of mitral valve stenosis.   Tricuspid Valve: The tricuspid valve is normal in structure. Tricuspid  valve regurgitation is trivial. No evidence of tricuspid stenosis.   Aortic Valve: The aortic valve is grossly normal. Aortic valve  regurgitation is not visualized. No aortic stenosis is present.   Pulmonic Valve: The pulmonic valve was grossly normal. Pulmonic valve  regurgitation is not visualized. No evidence of pulmonic stenosis.   Aorta: The aortic root, ascending aorta and aortic arch are all  structurally normal, with no evidence of dilitation or obstruction.   Venous: The inferior  vena cava is normal in size with greater than 50%  respiratory variability, suggesting right atrial pressure of 3 mmHg.   IAS/Shunts: The atrial septum is grossly normal.     Patient Profile     46 y.o. male with a hx of HTN, HLD, DM and OSA who is being seen 01/05/2022 for the evaluation of elevated troponin at the request of Dr. Carlis Abbott.    Assessment & Plan    NSTEMI CAD -- hsTn peaked at 7294, underwent cardiac cath noted above with multivessel CAD, seen by TCTS with initially plans for CABG pending renal function. This is now on hold as renal function worsened. Now CABG vs PCI. Will likely under PCI, but would like to see more improvement in Cr prior. Possibly tomorrow. Will load with Brilinta 136m x1, 987mBID in anticipation of PCI -- continue IV heparin, ASA, statin, coreg 12.34m334mID   ICM HFrEF -- LVEF 35-40%, global hypokinesis with severe HK in basal-inferior wall -- he is net + 8.9L pedal/hand edema, but received IVF in the setting of AKI. Will defer management to nephrology -- continue coreg, hydralazine. Will stop amlodipine, add Imdur. GDMT limited with AKI/CKD -- Right heart cath maybe beneficial to further assess volume status    Penetrating abd aortic ulcer -- Mural thrombus of abdominal aorta with small penetrating ulcer in the infrarenal abdominal aorta measuring 3 mm with no mural hematoma.  -- Evaluated by VVS with recommendations for pain management and blood pressure control, repeat scan 4 to 6 weeks as long as no recurrent pain.    HTN -- severely hypertensive on admission, requiring esmolol, cleviprex drips -- BP now improved -- continue coreg 12.34mg38mD, hydralazine to 50mg29m, add Imdur   AKI -- in the setting of acute illness/hypertensive emergency/contrast -- peak Cr 3.3, now improved to 2.3 this morning. GFR improved to 34 -- nephrology following    Abd pain GERD -- Lipase 24, CT abdomen on admission showed no abdominal pathology except abdominal  aortic ulcer -- rising alk phos -- seen by GI, Continue PPI.   Tobacco use -- cessation   For questions or updates, please  contact [place] Please consult [email or web address] for contact info under    Signed, Reino Bellis, NP  01/13/2022, 10:05 AM     Addendum:  Insurance will not cover Brilinta, therefore will load with plavix 327m x1 now, 749mdaily.   I have examined the patient and reviewed assessment and plan and discussed with patient.  Agree with above as stated.    Plan for PCI of the RCA tomorrow.  Hopefully, creatinine will have decreased further.  Of note, there was tortuosity noted in the right subclavian artery on the diagnostic images.  Given our attempts to minimize contrast, it may be better to go from the right femoral approach.  There will be a long area of stenting, likely 2 overlapping stents in the RCA required.  Adequate guide will help minimize dye usage and protect his renal function.  I discussed this with the patient.  He is agreeable.  JaLarae Grooms

## 2022-01-13 NOTE — Progress Notes (Signed)
   Heart Failure Stewardship Pharmacist Progress Note   PCP: Iona Beard, MD PCP-Cardiologist: Freada Bergeron, MD    HPI:  46 yo M with PMH of HTN, HLD, T2DM, and OSA.  Presented to the ED on 12/10 with acute abdominal pain, concerning for acute aortic syndrome. BP was 206/111 on arrival, hypertensive emergency. Found to have 46mm penetrating ulcer in the infrarenal abdominal aorta. Suspected secondary to persistent uncontrolled hypertension. No evidence of aortic aneurysm or dissection. Recommended medical management, goal SBP <140. Started on esmolol and cleviprex gtt. [id number]/88 with LVEF of 35 to 40%, G1DD, global hypokinesis but also severe focal hypokinesis of the basal to mid inferior wall, no significant valvular disease. 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 TCTS consulted for CABG. Hospitalization complicated by AKI. With unstable renal function, TCTS recommending PCI instead of CABG. Patient opted for PCI of the culprit RCA, anticipated for 12/20.   Current HF Medications: Beta Blocker: carvedilol 12.5 mg BID Other: hydralazine 50 mg TID + Imdur 30 mg daily  Prior to admission HF Medications: ACE/ARB/ARNI: olmesartan 20 mg daily SGLT2i: Jardiance 10 mg daily  Pertinent Lab Values: Serum creatinine 2.38, BUN 43, Potassium 3.2, Sodium 128, A1c 9.9   Vital Signs: Weight: 257 lbs (admission weight: 247 lbs) Blood pressure: 130/70s  Heart rate: 70s  I/O: not well documented  Medication Assistance / Insurance Benefits Check: Does the patient have prescription insurance?  Yes Type of insurance plan: [employer]  Outpatient Pharmacy:  Prior to admission outpatient pharmacy: [facility] Is the patient willing to use [facility] at discharge? Yes Is the patient willing to transition their outpatient pharmacy to utilize a [facility] outpatient pharmacy?   Pending    Assessment: 1. Acute systolic CHF (LVEF 89-16%), due to ICM. NYHA class II symptoms. -  Does not appear volume overloaded on exam, has received IV fluids this admission for AKI - Continue carvedilol 12.5 mg BID - No ACE/ARB/ARNI, MRA, or SGLT2i until renal function stabilization after cath - Continue hydralazine 50 mg TID + Imdur 30 mg daily   Plan: 1) Medication changes recommended at this time: - None pending PCI on 12/20  2) Patient assistance: - Entresto copay $15, copay card lowers to $10  3)  Education  - To be completed prior to discharge  Kerby Nora, PharmD, BCPS Heart Failure Stewardship Pharmacist Phone [phone]

## 2022-01-13 NOTE — TOC Benefit Eligibility Note (Signed)
Patient Teacher, English as a foreign language completed.    The patient is currently admitted and upon discharge could be taking Brilinta 90 mg.  Non Formulary must try Generic Plavix and Generic Effient first  The patient is insured through [place], [place] Patient Advocate Specialist [place] Patient Advocate Team Direct Number: [phone]  Fax: [phone]

## 2022-01-14 ENCOUNTER — Encounter (HOSPITAL_COMMUNITY)
Admission: EM | Disposition: A | Discharge status: Home / Self Care | Payer: Self-pay | Source: Home / Self Care | Attending: Internal Medicine

## 2022-01-14 ENCOUNTER — Encounter (HOSPITAL_COMMUNITY): Payer: Self-pay | Admitting: Pulmonary Disease

## 2022-01-14 ENCOUNTER — Other Ambulatory Visit (HOSPITAL_COMMUNITY): Payer: 59

## 2022-01-14 DIAGNOSIS — I214 Non-ST elevation (NSTEMI) myocardial infarction: Secondary | ICD-10-CM | POA: Diagnosis not present

## 2022-01-14 DIAGNOSIS — N183 Chronic kidney disease, stage 3 unspecified: Secondary | ICD-10-CM | POA: Diagnosis not present

## 2022-01-14 DIAGNOSIS — I161 Hypertensive emergency: Secondary | ICD-10-CM | POA: Diagnosis not present

## 2022-01-14 DIAGNOSIS — I2510 Atherosclerotic heart disease of native coronary artery without angina pectoris: Secondary | ICD-10-CM | POA: Diagnosis not present

## 2022-01-14 DIAGNOSIS — I25110 Atherosclerotic heart disease of native coronary artery with unstable angina pectoris: Secondary | ICD-10-CM | POA: Diagnosis not present

## 2022-01-14 DIAGNOSIS — R7989 Other specified abnormal findings of blood chemistry: Secondary | ICD-10-CM | POA: Diagnosis not present

## 2022-01-14 HISTORY — PX: CORONARY STENT INTERVENTION: CATH118234

## 2022-01-14 LAB — GLUCOSE, CAPILLARY
Glucose-Capillary: 151 mg/dL — ABNORMAL HIGH (ref 70–99)
Glucose-Capillary: 113 mg/dL — ABNORMAL HIGH (ref 70–99)
Glucose-Capillary: 206 mg/dL — ABNORMAL HIGH (ref 70–99)
Glucose-Capillary: 299 mg/dL — ABNORMAL HIGH (ref 70–99)

## 2022-01-14 LAB — CBC
HCT: 23.2 % — ABNORMAL LOW (ref 39.0–52.0)
Hemoglobin: 8.1 g/dL — ABNORMAL LOW (ref 13.0–17.0)
MCH: 29.9 pg (ref 26.0–34.0)
MCHC: 34.9 g/dL (ref 30.0–36.0)
MCV: 85.6 fL (ref 80.0–100.0)
Platelets: 349 10*3/uL (ref 150–400)
RBC: 2.71 MIL/uL — ABNORMAL LOW (ref 4.22–5.81)
RDW: 13.3 % (ref 11.5–15.5)
WBC: 22.9 10*3/uL — ABNORMAL HIGH (ref 4.0–10.5)
nRBC: 0 % (ref 0.0–0.2)

## 2022-01-14 LAB — COMPREHENSIVE METABOLIC PANEL
ALT: 30 U/L (ref 0–44)
AST: 32 U/L (ref 15–41)
Albumin: 1.7 g/dL — ABNORMAL LOW (ref 3.5–5.0)
Alkaline Phosphatase: 228 U/L — ABNORMAL HIGH (ref 38–126)
Anion gap: 10 (ref 5–15)
BUN: 36 mg/dL — ABNORMAL HIGH (ref 6–20)
CO2: 22 mmol/L (ref 22–32)
Calcium: 7.6 mg/dL — ABNORMAL LOW (ref 8.9–10.3)
Chloride: 99 mmol/L (ref 98–111)
Creatinine, Ser: 2.16 mg/dL — ABNORMAL HIGH (ref 0.61–1.24)
GFR, Estimated: 38 mL/min — ABNORMAL LOW (ref >60)
Glucose, Bld: 141 mg/dL — ABNORMAL HIGH (ref 70–99)
Potassium: 4.3 mmol/L (ref 3.5–5.1)
Sodium: 131 mmol/L — ABNORMAL LOW (ref 135–145)
Total Bilirubin: 0.8 mg/dL (ref 0.3–1.2)
Total Protein: 5.3 g/dL — ABNORMAL LOW (ref 6.5–8.1)

## 2022-01-14 LAB — POCT ACTIVATED CLOTTING TIME
Activated Clotting Time: 234 seconds
Activated Clotting Time: 239 seconds

## 2022-01-14 LAB — HEPARIN LEVEL (UNFRACTIONATED): Heparin Unfractionated: 0.54 IU/mL (ref 0.30–0.70)

## 2022-01-14 SURGERY — CORONARY STENT INTERVENTION
Anesthesia: LOCAL

## 2022-01-14 MED ORDER — HEPARIN SODIUM (PORCINE) 1000 UNIT/ML IJ SOLN
INTRAMUSCULAR | Status: DC | PRN
  Administered 2022-01-14: 10000 [IU] via INTRAVENOUS
  Administered 2022-01-14: 4000 [IU] via INTRAVENOUS

## 2022-01-14 MED ORDER — LIDOCAINE HCL (PF) 1 % IJ SOLN
INTRAMUSCULAR | Status: DC | PRN
  Administered 2022-01-14: 10 mL

## 2022-01-14 MED ORDER — SODIUM CHLORIDE 0.9% FLUSH: 3.0000 mL | INTRAVENOUS | Status: DC | PRN

## 2022-01-14 MED ORDER — MIDAZOLAM HCL 2 MG/2ML IJ SOLN
INTRAMUSCULAR | Status: AC
  Filled 2022-01-14: qty 2

## 2022-01-14 MED ORDER — HEPARIN SODIUM (PORCINE) 1000 UNIT/ML IJ SOLN
INTRAMUSCULAR | Status: AC
  Filled 2022-01-14: qty 10

## 2022-01-14 MED ORDER — SODIUM CHLORIDE 0.9 % IV SOLN: 250.0000 mL | INTRAVENOUS | Status: DC | PRN

## 2022-01-14 MED ORDER — SODIUM CHLORIDE 0.9% FLUSH
3.0000 mL | Freq: Two times a day (BID) | INTRAVENOUS | Status: DC
  Administered 2022-01-14 (×2): 3 mL via INTRAVENOUS

## 2022-01-14 MED ORDER — HYDRALAZINE HCL 20 MG/ML IJ SOLN: 10.0000 mg | INTRAMUSCULAR | Status: AC | PRN

## 2022-01-14 MED ORDER — IOHEXOL 350 MG/ML SOLN
INTRAVENOUS | Status: DC | PRN
  Administered 2022-01-14: 40 mL

## 2022-01-14 MED ORDER — SODIUM CHLORIDE 0.9 % IV SOLN: INTRAVENOUS | Status: DC

## 2022-01-14 MED ORDER — FENTANYL CITRATE (PF) 100 MCG/2ML IJ SOLN
INTRAMUSCULAR | Status: DC | PRN
  Administered 2022-01-14 (×2): 25 ug via INTRAVENOUS

## 2022-01-14 MED ORDER — LIDOCAINE HCL (PF) 1 % IJ SOLN
INTRAMUSCULAR | Status: AC
  Filled 2022-01-14: qty 30

## 2022-01-14 MED ORDER — SODIUM CHLORIDE 0.9% FLUSH
3.0000 mL | Freq: Two times a day (BID) | INTRAVENOUS | Status: DC
  Administered 2022-01-15 (×2): 3 mL via INTRAVENOUS

## 2022-01-14 MED ORDER — CLOPIDOGREL BISULFATE 75 MG PO TABS
ORAL_TABLET | ORAL | Status: AC
  Filled 2022-01-14: qty 1

## 2022-01-14 MED ORDER — HEPARIN (PORCINE) IN NACL 1000-0.9 UT/500ML-% IV SOLN
INTRAVENOUS | Status: AC
  Filled 2022-01-14: qty 1000

## 2022-01-14 MED ORDER — DIPHENHYDRAMINE HCL 50 MG/ML IJ SOLN
25.0000 mg | Freq: Once | INTRAMUSCULAR | Status: AC
  Administered 2022-01-14: 25 mg via INTRAVENOUS
  Filled 2022-01-14: qty 1

## 2022-01-14 MED ORDER — FENTANYL CITRATE (PF) 100 MCG/2ML IJ SOLN
INTRAMUSCULAR | Status: AC
  Filled 2022-01-14: qty 2

## 2022-01-14 MED ORDER — NITROGLYCERIN 1 MG/10 ML FOR IR/CATH LAB
INTRA_ARTERIAL | Status: DC | PRN
  Administered 2022-01-14: 150 ug via INTRACORONARY

## 2022-01-14 MED ORDER — MIDAZOLAM HCL 2 MG/2ML IJ SOLN
INTRAMUSCULAR | Status: DC | PRN
  Administered 2022-01-14: 2 mg via INTRAVENOUS
  Administered 2022-01-14: 1 mg via INTRAVENOUS

## 2022-01-14 MED ORDER — LABETALOL HCL 5 MG/ML IV SOLN: 10.0000 mg | INTRAVENOUS | Status: AC | PRN

## 2022-01-14 MED ORDER — SODIUM CHLORIDE 0.9 % WEIGHT BASED INFUSION
1.0000 mL/kg/h | INTRAVENOUS | Status: AC
  Administered 2022-01-14: 1 mL/kg/h via INTRAVENOUS

## 2022-01-14 MED ORDER — METHYLPREDNISOLONE SODIUM SUCC 125 MG IJ SOLR
125.0000 mg | Freq: Once | INTRAMUSCULAR | Status: AC
  Administered 2022-01-14: 125 mg via INTRAVENOUS
  Filled 2022-01-14: qty 2

## 2022-01-14 MED ORDER — CLOPIDOGREL BISULFATE 75 MG PO TABS
ORAL_TABLET | ORAL | Status: DC | PRN
  Administered 2022-01-14: 75 mg via ORAL

## 2022-01-14 MED ORDER — HEPARIN (PORCINE) IN NACL 1000-0.9 UT/500ML-% IV SOLN
INTRAVENOUS | Status: DC | PRN
  Administered 2022-01-14 (×2): 500 mL

## 2022-01-14 MED ORDER — NITROGLYCERIN 1 MG/10 ML FOR IR/CATH LAB
INTRA_ARTERIAL | Status: AC
  Filled 2022-01-14: qty 10

## 2022-01-14 SURGICAL SUPPLY — 20 items
BALLN EMERGE MR 2.5X15 (BALLOONS) ×1
BALLN ~~LOC~~ EMERGE MR 3.75X12 (BALLOONS) ×1
BALLOON EMERGE MR 2.5X15 (BALLOONS) IMPLANT
BALLOON ~~LOC~~ EMERGE MR 3.75X12 (BALLOONS) IMPLANT
CATH VISTA GUIDE 6FR JR4 (CATHETERS) IMPLANT
CLOSURE PERCLOSE PROSTYLE (VASCULAR PRODUCTS) IMPLANT
ELECT DEFIB PAD ADLT CADENCE (PAD) IMPLANT
KIT ENCORE 26 ADVANTAGE (KITS) IMPLANT
KIT HEART LEFT (KITS) ×2 IMPLANT
KIT MICROPUNCTURE NIT STIFF (SHEATH) IMPLANT
MAT PREVALON FULL STRYKER (MISCELLANEOUS) IMPLANT
PACK CARDIAC CATHETERIZATION (CUSTOM PROCEDURE TRAY) ×2 IMPLANT
SHEATH PINNACLE 5F 10CM (SHEATH) IMPLANT
SHEATH PINNACLE 6F 10CM (SHEATH) IMPLANT
STENT SYNERGY XD 3.50X24 (Permanent Stent) IMPLANT
SYNERGY XD 3.50X24 (Permanent Stent) ×1 IMPLANT
TRANSDUCER W/STOPCOCK (MISCELLANEOUS) ×2 IMPLANT
TUBING CIL FLEX 10 FLL-RA (TUBING) ×2 IMPLANT
WIRE COUGAR XT STRL 190CM (WIRE) IMPLANT
WIRE EMERALD 3MM-J .035X150CM (WIRE) IMPLANT

## 2022-01-14 NOTE — Progress Notes (Addendum)
Rounding Note    Patient Name: Shaun Cummings Date of Encounter: 01/14/2022  [place] Cardiologist: Freada Bergeron, MD   Subjective   No chest pain this morning.   Inpatient Medications    Scheduled Meds:  aspirin  81 mg Oral Daily   atorvastatin  80 mg Oral Daily   carvedilol  12.5 mg Oral BID WC   clopidogrel  75 mg Oral Daily   docusate sodium  100 mg Oral BID   hydrALAZINE  50 mg Oral Q8H   insulin aspart  0-15 Units Subcutaneous TID WC   insulin aspart  0-5 Units Subcutaneous QHS   insulin aspart  5 Units Subcutaneous TID WC   insulin glargine-yfgn  20 Units Subcutaneous Daily   isosorbide mononitrate  30 mg Oral Daily   lactulose  10 g Oral Daily   methylPREDNISolone (SOLU-MEDROL) injection  125 mg Intravenous Once   pantoprazole  40 mg Oral Q12H   sodium chloride flush  3 mL Intravenous Q12H   sodium chloride flush  3 mL Intravenous Q12H   sodium chloride flush  3 mL Intravenous Q12H   Continuous Infusions:  sodium chloride     sodium chloride 10 mL/hr at 01/08/22 2359   sodium chloride     sodium chloride     [START ON 01/15/2022] sodium chloride     diphenhydrAMINE     heparin 2,400 Units/hr (01/13/22 2122)   PRN Meds: sodium chloride, sodium chloride, acetaminophen, acetaminophen, diazepam, diphenhydrAMINE, HYDROmorphone (DILAUDID) injection, naLOXone (NARCAN)  injection, ondansetron (ZOFRAN) IV, mouth rinse, polyethylene glycol, sodium chloride flush, sodium chloride flush   Vital Signs    Vitals:   01/13/22 1503 01/13/22 1953 01/14/22 0533 01/14/22 0824  BP: 134/76 (!) 115/55 (!) 149/82 (!) 140/77  Pulse: 74 72 69 71  Resp: _0 Temp: 97.8 F (36.6 C) 98.7 F (37.1 C) 98.8 F (37.1 C)   TempSrc: Oral Oral Oral   SpO2: 98% 98% 98%   Weight:      Height:       No intake or output data in the 24 hours ending 01/14/22 0946    01/13/2022    6:16 AM 01/12/2022    3:50 AM 01/11/2022    5:00 AM  Last 3 Weights  Weight  (lbs) 257 lb 9.6 oz 258 lb 9.6 oz 257 lb 0.9 oz  Weight (kg) 116.847 kg 117.3 kg 116.6 kg      Telemetry    Sinus Rhythm - Personally Reviewed  ECG    No new tracing this morning  Physical Exam   GEN: No acute distress.   Neck: No JVD Cardiac: RRR, no murmurs, rubs, or gallops.  Respiratory: Clear to auscultation bilaterally. GI: Soft, nontender, non-distended  MS: No edema; No deformity. Right radial site with hematoma, painful Neuro:  Nonfocal  Psych: Normal affect   Labs    High Sensitivity Troponin:   Recent Labs  Lab 01/04/22 2209 01/05/22 0321 01/05/22 0654 01/05/22 0910 01/12/22 0230  TROPONINIHS 22* 1,719* 4,399* 7,294* 448*     Chemistry Recent Labs  Lab 01/12/22 0230 01/13/22 0158 01/14/22 0141  NA 128* 128* 131*  K 3.6 3.2* 4.3  CL 96* 97* 99  CO2 _1 GLUCOSE 137* 187* 141*  BUN 51* 43* 36*  CREATININE 2.58* 2.38* 2.16*  CALCIUM 7.3* 7.3* 7.6*  PROT 5.6* 5.3* 5.3*  ALBUMIN 1.9* 1.7* 1.7*  AST 30 31 32  ALT 31 34 30  ALKPHOS 197* 229* 228*  BILITOT 0.8 0.5 0.8  GFRNONAA 31* 34* 38*  ANIONGAP _0 Lipids No results for input(s): "CHOL", "TRIG", "HDL", "LABVLDL", "LDLCALC", "CHOLHDL" in the last 168 hours.  Hematology Recent Labs  Lab 01/12/22 0230 01/13/22 0158 01/14/22 0141  WBC 20.1* 19.2* 22.9*  RBC 3.07* 2.83* 2.71*  HGB 9.2* 8.3* 8.1*  HCT 25.7* 23.7* 23.2*  MCV 83.7 83.7 85.6  MCH 30.0 29.3 29.9  MCHC 35.8 35.0 34.9  RDW 13.2 13.1 13.3  PLT 330 337 349   Thyroid No results for input(s): "TSH", "FREET4" in the last 168 hours.  BNPNo results for input(s): "BNP", "PROBNP" in the last 168 hours.  DDimer No results for input(s): "DDIMER" in the last 168 hours.   Radiology    No results found.  Cardiac Studies   Cath: 01/06/2022     Dist LAD lesion is 50% stenosed.   Prox LAD lesion is 70% stenosed.   Ost RCA to Prox RCA lesion is 75% stenosed.   Prox RCA lesion is 75% stenosed.   Prox RCA to Mid RCA  lesion is 95% stenosed.   Mid RCA lesion is 50% stenosed.   1st Mrg lesion is 50% stenosed.   Mid Cx-2 lesion is 70% stenosed.   2nd Mrg lesion is 95% stenosed.   Mid Cx-1 lesion is 50% stenosed.   Severe multivessel CAD with the dominant RCA being the "culprit" vessel contributing to the non-STEMI with 70% diffuse proximal stenosis followed by 70% and 95% thrombotic mid stenosis and 50% distal stenosis.   There is 70% diffuse proximal LAD stenosis with diffuse 50% distal LAD stenosis.   The left circumflex vessel has diffuse 50% stenosis in the small OM 2 vessel with 95% focal stenosis in the OM 3 vessel and bifurcation AV groove 50% stenosis followed by 70% distal stenosis.   LVEDP elevated at 27 mm.   RECOMMENDATION: Surgical consultation will be recommended for CABG revascularization.  Will resume heparin 8 hours post sheath discontinuance.  Initiate medical therapy for significant CAD and aggressive lipid-lowering therapy with target LDL less than 55.  Smoking cessation is essential.     Diagnostic Dominance: Right  Echo: 01/05/2022   IMPRESSIONS     1. Left ventricular ejection fraction, by estimation, is 35 to 40%. The  left ventricle has moderately decreased function. The left ventricle  demonstrates regional wall motion abnormalities (see scoring  diagram/findings for description). The left  ventricular internal cavity size was mildly dilated. Left ventricular  diastolic parameters are consistent with Grade I diastolic dysfunction  (impaired relaxation). There is hypokinesis of the left ventricular,  septal wall, anterior wall and lateral wall.   There is severe hypokinesis of the left ventricular, basal-mid inferior  wall.   2. Right ventricular systolic function is normal. The right ventricular  size is normal.   3. Left atrial size was moderately dilated.   4. Right atrial size was mildly dilated.   5. The mitral valve is normal in structure. Trivial mitral valve   regurgitation. No evidence of mitral stenosis.   6. The aortic valve is grossly normal. Aortic valve regurgitation is not  visualized. No aortic stenosis is present.   7. The inferior vena cava is normal in size with greater than 50%  respiratory variability, suggesting right atrial pressure of 3 mmHg.   Comparison(s): No prior Echocardiogram.   Conclusion(s)/Recommendation(s): Reduced LVEF, with global hypokinesis but  also severe focal hypokinesis of basal  to mid inferior wall. Results will  be communicated to primary team.   FINDINGS   Left Ventricle: Left ventricular ejection fraction, by estimation, is 35  to 40%. The left ventricle has moderately decreased function. The left  ventricle demonstrates regional wall motion abnormalities. Severe  hypokinesis of the left ventricular,  basal-mid inferior wall. The left ventricular internal cavity size was  mildly dilated. There is borderline left ventricular hypertrophy. Left  ventricular diastolic parameters are consistent with Grade I diastolic  dysfunction (impaired relaxation).   Right Ventricle: The right ventricular size is normal. No increase in  right ventricular wall thickness. Right ventricular systolic function is  normal.   Left Atrium: Left atrial size was moderately dilated.   Right Atrium: Right atrial size was mildly dilated.   Pericardium: There is no evidence of pericardial effusion.   Mitral Valve: The mitral valve is normal in structure. Trivial mitral  valve regurgitation. No evidence of mitral valve stenosis.   Tricuspid Valve: The tricuspid valve is normal in structure. Tricuspid  valve regurgitation is trivial. No evidence of tricuspid stenosis.   Aortic Valve: The aortic valve is grossly normal. Aortic valve  regurgitation is not visualized. No aortic stenosis is present.   Pulmonic Valve: The pulmonic valve was grossly normal. Pulmonic valve  regurgitation is not visualized. No evidence of pulmonic  stenosis.   Aorta: The aortic root, ascending aorta and aortic arch are all  structurally normal, with no evidence of dilitation or obstruction.   Venous: The inferior vena cava is normal in size with greater than 50%  respiratory variability, suggesting right atrial pressure of 3 mmHg.   IAS/Shunts: The atrial septum is grossly normal.     Patient Profile     46 y.o. male  a hx of HTN, HLD, DM and OSA who is being seen 01/05/2022 for the evaluation of elevated troponin at the request of Dr. Carlis Abbott.     Assessment & Plan    NSTEMI CAD -- hsTn peaked at 7294, underwent cardiac cath noted above with multivessel CAD, seen by TCTS with initially plans for CABG pending renal function. This is now on hold as renal function worsened. Now CABG vs PCI. Given renal disease, it was felt PCI would be the best option. Planned for PCI of the RCA today, loaded with plavix 334m yesterday. Ordered for solumedrol/benadryl given contrast allergy.  -- continue IV heparin, ASA, statin, coreg 12.527mBID   ICM HFrEF -- LVEF 35-40%, global hypokinesis with severe HK in basal-inferior wall -- he is net + 8.9L pedal/hand edema, but received IVF in the setting of AKI. Will defer management to nephrology -- continue coreg, hydralazine, Imdur. GDMT limited with AKI/CKD -- plan for right heart cath would beneficial to further assess volume status    Penetrating abd aortic ulcer -- Mural thrombus of abdominal aorta with small penetrating ulcer in the infrarenal abdominal aorta measuring 3 mm with no mural hematoma.  -- Evaluated by VVS with recommendations for pain management and blood pressure control, repeat scan 4 to 6 weeks as long as no recurrent pain.    HTN -- severely hypertensive on admission, requiring esmolol, cleviprex drips -- BP now improved -- continue coreg 12.43m68mID, hydralazine to 9m61mD, add Imdur   AKI -- in the setting of acute illness/hypertensive emergency/contrast -- peak Cr 3.3,  now improved to 2.3 this morning. GFR improved to 34 -- nephrology following    Abd pain GERD -- Lipase 24, CT abdomen on admission  showed no abdominal pathology except abdominal aortic ulcer -- rising alk phos -- seen by GI, Continue PPI.   Tobacco use -- cessation  Right radial site hematoma -- new from yesterday, tender to palpation -- check doppler   For questions or updates, please contact [place] Please consult [email or web address] for contact info under        Signed, Reino Bellis, NP  01/14/2022, 9:46 AM     I have examined the patient and reviewed assessment and plan and discussed with patient.  Agree with above as stated.    New swelling in the right arm today.  Would use right groin approach, also due to right subclavian tortuosity.  Will get ultrasound of the right arm.  If any type of surgical intervention was needed, I think they would want the RCA fixed prior.  He has already been loaded with clopidogrel.   Renal function close to baseline.  In an attempt to minimize contrast, would try to cover the proximal to mid RCA with 2 long stents overlapping, rather than trying to perfectly position shorter stents that may require more dye to position.  Would consider more supportive guide to deliver a long stent like an XB RCA.  Larae Grooms

## 2022-01-14 NOTE — Interval H&P Note (Signed)
History and Physical Interval Note:  01/14/2022 2:18 PM  Shaun Cummings  has presented today for surgery, with the diagnosis of nstemi.  The various methods of treatment have been discussed with the patient and family. After consideration of risks, benefits and other options for treatment, the patient has consented to  Procedure(s): CORONARY STENT INTERVENTION (N/A) as a surgical intervention.  The patient's history has been reviewed, patient examined, no change in status, stable for surgery.  I have reviewed the patient's chart and labs.  Questions were answered to the patient's satisfaction.     Sherren Mocha

## 2022-01-14 NOTE — Progress Notes (Signed)
   Heart Failure Stewardship Pharmacist Progress Note   PCP: Iona Beard, MD PCP-Cardiologist: Freada Bergeron, MD    HPI:  46 yo M with PMH of HTN, HLD, T2DM, and OSA.  Presented to the ED on 12/10 with acute abdominal pain, concerning for acute aortic syndrome. BP was 206/111 on arrival, hypertensive emergency. Found to have 48mm penetrating ulcer in the infrarenal abdominal aorta. Suspected secondary to persistent uncontrolled hypertension. No evidence of aortic aneurysm or dissection. Recommended medical management, goal SBP <140. Started on esmolol and cleviprex gtt. [id number] with LVEF of 35 to 40%, G1DD, global hypokinesis but also severe focal hypokinesis of the basal to mid inferior wall, no significant valvular disease. 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 TCTS consulted for CABG. Hospitalization complicated by AKI. With unstable renal function, TCTS recommending PCI instead of CABG. Patient opted for PCI of the culprit RCA, anticipated for 12/20.   Current HF Medications: Beta Blocker: carvedilol 12.5 mg BID Other: hydralazine 50 mg TID + Imdur 30 mg daily  Prior to admission HF Medications: ACE/ARB/ARNI: olmesartan 20 mg daily SGLT2i: Jardiance 10 mg daily  Pertinent Lab Values: Serum creatinine 2.16, BUN 36, Potassium 4.3 (hemolysis), Sodium 131, A1c 9.9   Vital Signs: Weight: 257 lbs (admission weight: 247 lbs) Blood pressure: 140/70s  Heart rate: 60-70s  I/O: not well documented  Medication Assistance / Insurance Benefits Check: Does the patient have prescription insurance?  Yes Type of insurance plan: [employer]  Outpatient Pharmacy:  Prior to admission outpatient pharmacy: [facility] Is the patient willing to use [facility] at discharge? Yes Is the patient willing to transition their outpatient pharmacy to utilize a [facility] outpatient pharmacy?   Pending    Assessment: 1. Acute systolic CHF (LVEF 77-11%), due to ICM. NYHA class  II symptoms. - Does not appear volume overloaded on exam, has received IV fluids this admission for AKI - Continue carvedilol 12.5 mg BID - No ACE/ARB/ARNI, MRA, or SGLT2i until renal function stabilization after cath - Continue hydralazine 50 mg TID + Imdur 30 mg daily   Plan: 1) Medication changes recommended at this time: - None pending PCI on 12/20 - Check magnesium level in AM  2) Patient assistance: - Entresto copay $15, copay card lowers to $10  3)  Education  - Initial education completed - Full education to be completed prior to discharge  Kerby Nora, PharmD, BCPS Heart Failure Cytogeneticist Phone [phone]

## 2022-01-14 NOTE — Progress Notes (Signed)
Consultation Progress Note   Patient: Shaun Cummings TOI:[id number] DOB: 1975/04/02 DOA: 01/04/2022 DOS: the patient was seen and examined on 01/14/2022 Primary service: Domique Clapper, Manfred Shirts, MD  Brief hospital course: Patient is a 46 year old male with HTN, HLD, OSA, diabetes mellitus type 2 presented with chest pain.  In ED, BP noted to be 206/111, troponin 19 CTA chest abdomen pelvis showed small penetrating ulcer in the infrarenal abdominal aorta measuring 3 mm, no mural hematoma.  No evidence of aortic aneurysm or dissection.  Creatinine 1.9 Vascular surgery was consulted and recommended medical management.  Patient was placed on esmolol drip and was admitted to ICU by PCCM.   Transferred to the floor and TRH assumed care on 01/07/2022   Assessment and Plan: Principal Problem:   Hypertensive emergency -Off esmolol drip, on labetalol PRN with parameters -BP controlled, continue Coreg, amlodipine, hydralazine    Active Problems: Severe multivessel coronary disease, NSTEMI Acute HFrEF -Cardiology was consulted, underwent 2D echo which showed EF of 35 to 40% with hypokinesis of LV septal, anterior and lateral walls.  Severe hypokinesis of the LV basal mid inferior wall, G1 DD. -Underwent cardiac cath which showed severe multivessel disease, cardiology, TCTS -Plans for any intervention currently on hold due to worsening of the renal function. -Management per cardiology and CTVS - has been having off-and-on chest pain episodes. Required Nitro drip which has been dced -No chest pain.     AKI (acute kidney injury) ([place]) -Creatinine improved to 2.38 -UA negative for UTI, renal ultrasound with no obstructive uropathy.  AKI likely due to contrast nephropathy. -Nephrology following, continue IV fluid hydration, supportive treatment      Penetrating abdominal aortic ulcer -Initially presented with abdominal/back pain, found to have mural thrombus of abdominal aorta with small penetrating  ulcer and infrarenal abdominal aorta measuring 3 mm with no medial hematoma -Evaluated by vascular surgery, recommended pain management, BP control  -Abdominal pain has improved   Acute abdominal pain, history of GERD -Abdominal pain is now resolved -Lipase 24, recent CT abdomen showed no abdominal pathology except abdominal aortic ulcer -Abd XR 12/13 showed nonobstructive bowel gas pattern with moderate volume of stool within the colon, placed on bowel regimen -Continue PPI. GI signed off.     Diabetes mellitus type 2, uncontrolled with hyperglycemia Hemoglobin A1c 11.5 -Continue Semglee 20 units daily, NovoLog 5 units 3 times daily AC, SSI   Recent Labs (last 2 labs)                 Recent Labs    01/10/22 1937 01/11/22 0009 01/11/22 0427 01/11/22 0833 01/11/22 1120 01/11/22 1227  GLUCAP 122* 133* 129* 128* 192* 191*        OSA -Continue CPAP nightly     GERD -Continue PPI   Obesity  Estimated body mass index is 35.85 kg/m as calculated from the following:   Height as of this encounter: 5\' 11"  (1.803 m).   Weight as of this encounter: 116.6 kg.   Code Status: Full code DVT Prophylaxis:  SCD's Start: 01/06/22 1235 SCDs Start: 01/04/22 2332     Level of Care: Level of care: Progressive Family Communication: Updated patient's wife at the bedside today   Disposition Plan:      Remains inpatient appropriate:       Procedures:  Cardiac cath Consultants:   Vascular surgery Was admitted by CCM Cardiac thoracic surgery             TRH will continue to follow the  patient.  Subjective: Per Patient he is scheduled for Cath today, currently NPO. He appears anxious, wife at bedside  Physical Exam: Vitals:   01/13/22 1503 01/13/22 1953 01/14/22 0533 01/14/22 0824  BP: 134/76 (!) 115/55 (!) 149/82 (!) 140/77  Pulse: 74 72 69 71  Resp: 18 19 20    Temp: 97.8 F (36.6 C) 98.7 F (37.1 C) 98.8 F (37.1 C)   TempSrc: Oral Oral Oral   SpO2: 98% 98% 98%    Weight:      Height:      General: Alert and oriented x 3, NAD Cardiovascular: S1 S2 clear, RRR.  Respiratory: CTAB, no wheezing, rales or rhonchi Gastrointestinal: Soft, nontender, nondistended, NBS Ext: no pedal edema bilaterally Psych: Normal affect   Data Reviewed:  There are no new results to review at this time.  Family Communication: Wife at bedside  Time spent: 15 minutes.  Author: Cristela Felt, MD 01/14/2022 1:51 PM  For on call review [email or web address].

## 2022-01-14 NOTE — Progress Notes (Signed)
Heart Failure Nurse Navigator Progress Note  PCP: Iona Beard, MD PCP-Cardiologist: Johney Frame Admission Diagnosis: Acute kidney injury Admitted from: Home  Presentation:   Shaun Cummings presented with acute abdominal pain, concern for acute aortic syndrome. HTN with BP 206/111, found to have a 109mm penetrating ulcer in infrarenal abdominal aorta. LHC 12/12 severe multi vessel CAD, due to unstable renal function TCTS recommended PCI instead of CABG.  Patient and wife were educated on the sign and symptoms of heart failure, daily weights, when to call his doctor or go to the ED, Diet/ fluid restrictions, taking all medications as prescribed and attending all medical appointments. Patient voiced his understanding and a Hf TOC appointment was scheduled for 01/30/22.   ECHO/ LVEF: 35-40% G1DD  Clinical Course:  Past Medical History:  Diagnosis Date   Chest pain    HTN (hypertension)    Hyperlipidemia    Lesion of penis    foreskin   OSA (obstructive sleep apnea)    per pt dx osa and used cpap but after losing wt. from 415 pounds down to 244 pounds no longer needs cpap   Peripheral neuropathy    Phimosis    Scrotal abscess 05/08/2017   Type 2 diabetes mellitus ([place])    per pt no meds for two years pt was changed to levemir unable to afford but pt states is waiting on approval for a program he applied for that will pay for his meds [facility])       Social History   Socioeconomic History   Marital status: Married    Spouse name: Jerral Bonito   Number of children: 2   Years of education: college    Highest education level: Not on file  Occupational History   Occupation: [employer]   Tobacco Use   Smoking status: Former    Years: 2.00    Types: Cigarettes    Quit date: 02/05/2008    Years since quitting: 13.9   Smokeless tobacco: Never  Vaping Use   Vaping Use: Never used  Substance and Sexual Activity   Alcohol use: No   Drug use: Not Currently    Types: Marijuana     Comment: quit 1 month ago.   Sexual activity: Not on file  Other Topics Concern   Not on file  Social History Narrative   Lives with wife and 2 kids, age 25 and 7.    Social Determinants of Health   Financial Resource Strain: Low Risk  (01/14/2022)   Overall Financial Resource Strain (CARDIA)    Difficulty of Paying Living Expenses: Not hard at all  Food Insecurity: Not on file  Transportation Needs: No Transportation Needs (01/14/2022)   PRAPARE - Hydrologist (Medical): No    Lack of Transportation (Non-Medical): No  Physical Activity: Not on file  Stress: Not on file  Social Connections: Not on file   Education Assessment and Provision:  Detailed education and instructions provided on heart failure disease management including the following:  Signs and symptoms of Heart Failure When to call the physician Importance of daily weights Low sodium diet Fluid restriction Medication management Anticipated future follow-up appointments  Patient education given on each of the above topics.  Patient acknowledges understanding via teach back method and acceptance of all instructions.  Education Materials:  "Living Better With Heart Failure" Booklet, HF zone tool, & Daily Weight Tracker Tool.  Patient has scale at home: yes Patient has pill box at home: NA  High Risk Criteria for Readmission and/or Poor Patient Outcomes: Heart failure hospital admissions (last 6 months): 0  No Show rate: 6 % Difficult social situation: No Demonstrates medication adherence: Yes Primary Language: English Literacy level: Reading, writing, and comprehension  Barriers of Care:   Diet/ fluid/ daily weights  Continued HF Education  Considerations/Referrals:   Referral made to Heart Failure Pharmacist Stewardship: yes Referral made to Heart Failure CSW/NCM TOC: yes, info on getting a handicap sticker for car.  Referral made to Heart & Vascular TOC clinic: Yes,  01/30/22  Items for Follow-up on DC/TOC: Diet/ fluid/ daily weights Continued HF Education CSW: info on applying for a handicap sticker for his car.    Earnestine Leys, BSN, Clinical cytogeneticist Only

## 2022-01-14 NOTE — H&P (View-Only) (Signed)
Rounding Note    Patient Name: Shaun Cummings Date of Encounter: 01/14/2022  [place] Cardiologist: Freada Bergeron, MD   Subjective   No chest pain this morning.   Inpatient Medications    Scheduled Meds:  aspirin  81 mg Oral Daily   atorvastatin  80 mg Oral Daily   carvedilol  12.5 mg Oral BID WC   clopidogrel  75 mg Oral Daily   docusate sodium  100 mg Oral BID   hydrALAZINE  50 mg Oral Q8H   insulin aspart  0-15 Units Subcutaneous TID WC   insulin aspart  0-5 Units Subcutaneous QHS   insulin aspart  5 Units Subcutaneous TID WC   insulin glargine-yfgn  20 Units Subcutaneous Daily   isosorbide mononitrate  30 mg Oral Daily   lactulose  10 g Oral Daily   methylPREDNISolone (SOLU-MEDROL) injection  125 mg Intravenous Once   pantoprazole  40 mg Oral Q12H   sodium chloride flush  3 mL Intravenous Q12H   sodium chloride flush  3 mL Intravenous Q12H   sodium chloride flush  3 mL Intravenous Q12H   Continuous Infusions:  sodium chloride     sodium chloride 10 mL/hr at 01/08/22 2359   sodium chloride     sodium chloride     [START ON 01/15/2022] sodium chloride     diphenhydrAMINE     heparin 2,400 Units/hr (01/13/22 2122)   PRN Meds: sodium chloride, sodium chloride, acetaminophen, acetaminophen, diazepam, diphenhydrAMINE, HYDROmorphone (DILAUDID) injection, naLOXone (NARCAN)  injection, ondansetron (ZOFRAN) IV, mouth rinse, polyethylene glycol, sodium chloride flush, sodium chloride flush   Vital Signs    Vitals:   01/13/22 1503 01/13/22 1953 01/14/22 0533 01/14/22 0824  BP: 134/76 (!) 115/55 (!) 149/82 (!) 140/77  Pulse: 74 72 69 71  Resp: _0 Temp: 97.8 F (36.6 C) 98.7 F (37.1 C) 98.8 F (37.1 C)   TempSrc: Oral Oral Oral   SpO2: 98% 98% 98%   Weight:      Height:       No intake or output data in the 24 hours ending 01/14/22 0946    01/13/2022    6:16 AM 01/12/2022    3:50 AM 01/11/2022    5:00 AM  Last 3 Weights  Weight  (lbs) 257 lb 9.6 oz 258 lb 9.6 oz 257 lb 0.9 oz  Weight (kg) 116.847 kg 117.3 kg 116.6 kg      Telemetry    Sinus Rhythm - Personally Reviewed  ECG    No new tracing this morning  Physical Exam   GEN: No acute distress.   Neck: No JVD Cardiac: RRR, no murmurs, rubs, or gallops.  Respiratory: Clear to auscultation bilaterally. GI: Soft, nontender, non-distended  MS: No edema; No deformity. Right radial site with hematoma, painful Neuro:  Nonfocal  Psych: Normal affect   Labs    High Sensitivity Troponin:   Recent Labs  Lab 01/04/22 2209 01/05/22 0321 01/05/22 0654 01/05/22 0910 01/12/22 0230  TROPONINIHS 22* 1,719* 4,399* 7,294* 448*     Chemistry Recent Labs  Lab 01/12/22 0230 01/13/22 0158 01/14/22 0141  NA 128* 128* 131*  K 3.6 3.2* 4.3  CL 96* 97* 99  CO2 _1 GLUCOSE 137* 187* 141*  BUN 51* 43* 36*  CREATININE 2.58* 2.38* 2.16*  CALCIUM 7.3* 7.3* 7.6*  PROT 5.6* 5.3* 5.3*  ALBUMIN 1.9* 1.7* 1.7*  AST 30 31 32  ALT 31 34 30  ALKPHOS 197* 229* 228*  BILITOT 0.8 0.5 0.8  GFRNONAA 31* 34* 38*  ANIONGAP _0 Lipids No results for input(s): "CHOL", "TRIG", "HDL", "LABVLDL", "LDLCALC", "CHOLHDL" in the last 168 hours.  Hematology Recent Labs  Lab 01/12/22 0230 01/13/22 0158 01/14/22 0141  WBC 20.1* 19.2* 22.9*  RBC 3.07* 2.83* 2.71*  HGB 9.2* 8.3* 8.1*  HCT 25.7* 23.7* 23.2*  MCV 83.7 83.7 85.6  MCH 30.0 29.3 29.9  MCHC 35.8 35.0 34.9  RDW 13.2 13.1 13.3  PLT 330 337 349   Thyroid No results for input(s): "TSH", "FREET4" in the last 168 hours.  BNPNo results for input(s): "BNP", "PROBNP" in the last 168 hours.  DDimer No results for input(s): "DDIMER" in the last 168 hours.   Radiology    No results found.  Cardiac Studies   Cath: 01/06/2022     Dist LAD lesion is 50% stenosed.   Prox LAD lesion is 70% stenosed.   Ost RCA to Prox RCA lesion is 75% stenosed.   Prox RCA lesion is 75% stenosed.   Prox RCA to Mid RCA  lesion is 95% stenosed.   Mid RCA lesion is 50% stenosed.   1st Mrg lesion is 50% stenosed.   Mid Cx-2 lesion is 70% stenosed.   2nd Mrg lesion is 95% stenosed.   Mid Cx-1 lesion is 50% stenosed.   Severe multivessel CAD with the dominant RCA being the "culprit" vessel contributing to the non-STEMI with 70% diffuse proximal stenosis followed by 70% and 95% thrombotic mid stenosis and 50% distal stenosis.   There is 70% diffuse proximal LAD stenosis with diffuse 50% distal LAD stenosis.   The left circumflex vessel has diffuse 50% stenosis in the small OM 2 vessel with 95% focal stenosis in the OM 3 vessel and bifurcation AV groove 50% stenosis followed by 70% distal stenosis.   LVEDP elevated at 27 mm.   RECOMMENDATION: Surgical consultation will be recommended for CABG revascularization.  Will resume heparin 8 hours post sheath discontinuance.  Initiate medical therapy for significant CAD and aggressive lipid-lowering therapy with target LDL less than 55.  Smoking cessation is essential.     Diagnostic Dominance: Right  Echo: 01/05/2022   IMPRESSIONS     1. Left ventricular ejection fraction, by estimation, is 35 to 40%. The  left ventricle has moderately decreased function. The left ventricle  demonstrates regional wall motion abnormalities (see scoring  diagram/findings for description). The left  ventricular internal cavity size was mildly dilated. Left ventricular  diastolic parameters are consistent with Grade I diastolic dysfunction  (impaired relaxation). There is hypokinesis of the left ventricular,  septal wall, anterior wall and lateral wall.   There is severe hypokinesis of the left ventricular, basal-mid inferior  wall.   2. Right ventricular systolic function is normal. The right ventricular  size is normal.   3. Left atrial size was moderately dilated.   4. Right atrial size was mildly dilated.   5. The mitral valve is normal in structure. Trivial mitral valve   regurgitation. No evidence of mitral stenosis.   6. The aortic valve is grossly normal. Aortic valve regurgitation is not  visualized. No aortic stenosis is present.   7. The inferior vena cava is normal in size with greater than 50%  respiratory variability, suggesting right atrial pressure of 3 mmHg.   Comparison(s): No prior Echocardiogram.   Conclusion(s)/Recommendation(s): Reduced LVEF, with global hypokinesis but  also severe focal hypokinesis of basal  to mid inferior wall. Results will  be communicated to primary team.   FINDINGS   Left Ventricle: Left ventricular ejection fraction, by estimation, is 35  to 40%. The left ventricle has moderately decreased function. The left  ventricle demonstrates regional wall motion abnormalities. Severe  hypokinesis of the left ventricular,  basal-mid inferior wall. The left ventricular internal cavity size was  mildly dilated. There is borderline left ventricular hypertrophy. Left  ventricular diastolic parameters are consistent with Grade I diastolic  dysfunction (impaired relaxation).   Right Ventricle: The right ventricular size is normal. No increase in  right ventricular wall thickness. Right ventricular systolic function is  normal.   Left Atrium: Left atrial size was moderately dilated.   Right Atrium: Right atrial size was mildly dilated.   Pericardium: There is no evidence of pericardial effusion.   Mitral Valve: The mitral valve is normal in structure. Trivial mitral  valve regurgitation. No evidence of mitral valve stenosis.   Tricuspid Valve: The tricuspid valve is normal in structure. Tricuspid  valve regurgitation is trivial. No evidence of tricuspid stenosis.   Aortic Valve: The aortic valve is grossly normal. Aortic valve  regurgitation is not visualized. No aortic stenosis is present.   Pulmonic Valve: The pulmonic valve was grossly normal. Pulmonic valve  regurgitation is not visualized. No evidence of pulmonic  stenosis.   Aorta: The aortic root, ascending aorta and aortic arch are all  structurally normal, with no evidence of dilitation or obstruction.   Venous: The inferior vena cava is normal in size with greater than 50%  respiratory variability, suggesting right atrial pressure of 3 mmHg.   IAS/Shunts: The atrial septum is grossly normal.     Patient Profile     46 y.o. male  a hx of HTN, HLD, DM and OSA who is being seen 01/05/2022 for the evaluation of elevated troponin at the request of Dr. Carlis Abbott.     Assessment & Plan    NSTEMI CAD -- hsTn peaked at 7294, underwent cardiac cath noted above with multivessel CAD, seen by TCTS with initially plans for CABG pending renal function. This is now on hold as renal function worsened. Now CABG vs PCI. Given renal disease, it was felt PCI would be the best option. Planned for PCI of the RCA today, loaded with plavix 370m yesterday. Ordered for solumedrol/benadryl given contrast allergy.  -- continue IV heparin, ASA, statin, coreg 12.576mBID   ICM HFrEF -- LVEF 35-40%, global hypokinesis with severe HK in basal-inferior wall -- he is net + 8.9L pedal/hand edema, but received IVF in the setting of AKI. Will defer management to nephrology -- continue coreg, hydralazine, Imdur. GDMT limited with AKI/CKD -- plan for right heart cath would beneficial to further assess volume status    Penetrating abd aortic ulcer -- Mural thrombus of abdominal aorta with small penetrating ulcer in the infrarenal abdominal aorta measuring 3 mm with no mural hematoma.  -- Evaluated by VVS with recommendations for pain management and blood pressure control, repeat scan 4 to 6 weeks as long as no recurrent pain.    HTN -- severely hypertensive on admission, requiring esmolol, cleviprex drips -- BP now improved -- continue coreg 12.64m71mID, hydralazine to 64m66mD, add Imdur   AKI -- in the setting of acute illness/hypertensive emergency/contrast -- peak Cr 3.3,  now improved to 2.3 this morning. GFR improved to 34 -- nephrology following    Abd pain GERD -- Lipase 24, CT abdomen on admission  showed no abdominal pathology except abdominal aortic ulcer -- rising alk phos -- seen by GI, Continue PPI.   Tobacco use -- cessation  Right radial site hematoma -- new from yesterday, tender to palpation -- check doppler   For questions or updates, please contact [place] Please consult [email or web address] for contact info under        Signed, Reino Bellis, NP  01/14/2022, 9:46 AM     I have examined the patient and reviewed assessment and plan and discussed with patient.  Agree with above as stated.    New swelling in the right arm today.  Would use right groin approach, also due to right subclavian tortuosity.  Will get ultrasound of the right arm.  If any type of surgical intervention was needed, I think they would want the RCA fixed prior.  He has already been loaded with clopidogrel.   Renal function close to baseline.  In an attempt to minimize contrast, would try to cover the proximal to mid RCA with 2 long stents overlapping, rather than trying to perfectly position shorter stents that may require more dye to position.  Would consider more supportive guide to deliver a long stent like an XB RCA.  Larae Grooms

## 2022-01-14 NOTE — Progress Notes (Signed)
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; doubt additional GN present Most recent UA without hematuria Renal ultrasound reassuring with findings consistent with medical renal disease Acute insult almost certainly is contrast and acute illness /hypertensive emergency Cr downtrending, PCI today- will need post-cath fluids NSTEMI with LHC showing multivessel disease with plan for CABG vs PCI both on hold pending GFR recovery Hypertensive emergency at admission, improved on oral agents Penetrating aortic ulcer followed by vascular surgery Abdominal pain, question if related #4, GI eval'd; resolved in time Metabolic acidosis stable Mild hyponatremia, monitor Leukocytosis, stable Normocytic anemia Recent tobacco cessation OSA Hyperlipidemia    Subjective:   Cr down, for cardiac cath today.  Says he feels achy.     Objective:   BP (!) 140/77   Pulse 71   Temp 98.8 F (37.1 C) (Oral)   Resp 20   Ht 5\' 11"  (1.803 m)   Wt 116.8 kg   SpO2 98%   BMI 35.93 kg/m   Physical Exam: Gen:NAD, lying in bed CVS: RRR Resp: clear Abd: soft Ext: no LE edema  Labs: BMET Recent Labs  Lab 01/08/22 0207 01/09/22 0207 01/10/22 0949 01/11/22 0141 01/12/22 0230 01/13/22 0158 01/14/22 0141  NA 131* 128* 128* 129* 128* 128* 131*  K 3.5 3.1* 3.2* 3.4* 3.6 3.2* 4.3  CL 102 94* 94* 94* 96* 97* 99  CO2 18* 22 23 21* 22 23 22   GLUCOSE 170* 162* 212* 140* 137* 187* 141*  BUN 41* 51* 57* 58* 51* 43* 36*  CREATININE 2.46* 3.01* 3.31* 3.02* 2.58* 2.38* 2.16*  CALCIUM 8.0* 7.4* 7.5* 7.3* 7.3* 7.3* 7.6*   CBC Recent Labs  Lab 01/11/22 0141 01/12/22 0230 01/13/22 0158 01/14/22 0141  WBC 20.8* 20.1* 19.2* 22.9*  HGB 8.9* 9.2* 8.3* 8.1*  HCT 25.6*  25.7* 23.7* 23.2*  MCV 85.0 83.7 83.7 85.6  PLT 312 330 337 349      Medications:     [MAR Hold] aspirin  81 mg Oral Daily   [MAR Hold] atorvastatin  80 mg Oral Daily   [MAR Hold] carvedilol  12.5 mg Oral BID WC   [MAR Hold] clopidogrel  75 mg Oral Daily   [MAR Hold] docusate sodium  100 mg Oral BID   [MAR Hold] hydrALAZINE  50 mg Oral Q8H   [MAR Hold] insulin aspart  0-15 Units Subcutaneous TID WC   [MAR Hold] insulin aspart  0-5 Units Subcutaneous QHS   [MAR Hold] insulin aspart  5 Units Subcutaneous TID WC   [MAR Hold] insulin glargine-yfgn  20 Units Subcutaneous Daily   [MAR Hold] isosorbide mononitrate  30 mg Oral Daily   [MAR Hold] lactulose  10 g Oral Daily   [MAR Hold] pantoprazole  40 mg Oral Q12H   [MAR Hold] sodium chloride flush  3 mL Intravenous Q12H   [MAR Hold] sodium chloride flush  3 mL Intravenous Q12H   [MAR Hold] sodium chloride flush  3 mL Intravenous Q12H     Madelon Lips, MD 01/14/2022, 2:36 PM

## 2022-01-14 NOTE — Progress Notes (Signed)
ANTICOAGULATION CONSULT NOTE-Follow Up  Pharmacy Consult for heparin  Indication: chest pain/ACS  Allergies  Allergen Reactions   Augmentin [Amoxicillin-Pot Clavulanate] Hives and Itching    Has patient had a PCN reaction causing immediate rash, facial/tongue/throat swelling, SOB or lightheadedness with hypotension: Yes Has patient had a PCN reaction causing severe rash involving mucus membranes or skin necrosis: No Has patient had a PCN reaction that required hospitalization: No Has patient had a PCN reaction occurring within the last 10 years: No If all of the above answers are "NO", then may proceed with Cephalosporin use.   Contrast Media [Iodinated Contrast Media] Hives    Pt broke out with 1 hive on forehead after CT injection-treated with 25mg  Benadryl    Patient Measurements: Height: 5\' 11"  (180.3 cm) Weight: 116.8 kg (257 lb 9.6 oz) IBW/kg (Calculated) : 75.3 Heparin Dosing Weight: 99.5 kg  Vital Signs: Temp: 98.8 F (37.1 C) (12/20 0533) Temp Source: Oral (12/20 0533) BP: 140/77 (12/20 0824) Pulse Rate: 71 (12/20 0824)  Labs: Recent Labs    01/12/22 0230 01/13/22 0158 01/14/22 0141  HGB 9.2* 8.3* 8.1*  HCT 25.7* 23.7* 23.2*  PLT 330 337 349  HEPARINUNFRC 0.68 0.47 0.54  CREATININE 2.58* 2.38* 2.16*  TROPONINIHS 448*  --   --      Estimated Creatinine Clearance: 56.7 mL/min (A) (by C-G formula based on SCr of 2.16 mg/dL (H)).   Medical History: Past Medical History:  Diagnosis Date   Chest pain    HTN (hypertension)    Hyperlipidemia    Lesion of penis    foreskin   OSA (obstructive sleep apnea)    per pt dx osa and used cpap but after losing wt. from 415 pounds down to 244 pounds no longer needs cpap   Peripheral neuropathy    Phimosis    Scrotal abscess 05/08/2017   Type 2 diabetes mellitus ([place])    per pt no meds for two years pt was changed to levemir unable to afford but pt states is waiting on approval for a program he applied for that will  pay for his meds ([place])      Assessment: 46 yo M with c/f NSTEMI with elevated troponins and EKG changes. No AC PTA. 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   Heparin level remains therapeutic at 0.54, CBC stable. Staged PCI today.  Goal of Therapy:  Heparin level 0.3-0.7 units/ml Monitor platelets by anticoagulation protocol: Yes   Plan:  Continue heparin gtt at 2400 units/hr F/U anticoag plans post/cath   Arrie Senate, PharmD, BCPS, [facility] Clinical Pharmacist [phone] Please check AMION for all [place] numbers 01/14/2022

## 2022-01-15 ENCOUNTER — Other Ambulatory Visit (HOSPITAL_COMMUNITY): Payer: 59

## 2022-01-15 ENCOUNTER — Encounter (HOSPITAL_COMMUNITY): Payer: Self-pay | Admitting: Cardiovascular Disease

## 2022-01-15 ENCOUNTER — Telehealth: Payer: Self-pay

## 2022-01-15 DIAGNOSIS — I161 Hypertensive emergency: Secondary | ICD-10-CM | POA: Diagnosis not present

## 2022-01-15 DIAGNOSIS — I25110 Atherosclerotic heart disease of native coronary artery with unstable angina pectoris: Secondary | ICD-10-CM | POA: Diagnosis not present

## 2022-01-15 DIAGNOSIS — N179 Acute kidney failure, unspecified: Secondary | ICD-10-CM | POA: Diagnosis not present

## 2022-01-15 LAB — GLUCOSE, CAPILLARY
Glucose-Capillary: 277 mg/dL — ABNORMAL HIGH (ref 70–99)
Glucose-Capillary: 276 mg/dL — ABNORMAL HIGH (ref 70–99)
Glucose-Capillary: 223 mg/dL — ABNORMAL HIGH (ref 70–99)
Glucose-Capillary: 181 mg/dL — ABNORMAL HIGH (ref 70–99)
Glucose-Capillary: 164 mg/dL — ABNORMAL HIGH (ref 70–99)
Glucose-Capillary: 177 mg/dL — ABNORMAL HIGH (ref 70–99)

## 2022-01-15 LAB — BASIC METABOLIC PANEL
Anion gap: 9 (ref 5–15)
BUN: 32 mg/dL — ABNORMAL HIGH (ref 6–20)
CO2: 21 mmol/L — ABNORMAL LOW (ref 22–32)
Calcium: 7.8 mg/dL — ABNORMAL LOW (ref 8.9–10.3)
Chloride: 99 mmol/L (ref 98–111)
Creatinine, Ser: 2.2 mg/dL — ABNORMAL HIGH (ref 0.61–1.24)
GFR, Estimated: 37 mL/min — ABNORMAL LOW (ref >60)
Glucose, Bld: 323 mg/dL — ABNORMAL HIGH (ref 70–99)
Potassium: 4.5 mmol/L (ref 3.5–5.1)
Sodium: 129 mmol/L — ABNORMAL LOW (ref 135–145)

## 2022-01-15 LAB — CBC
HCT: 25.8 % — ABNORMAL LOW (ref 39.0–52.0)
Hemoglobin: 9 g/dL — ABNORMAL LOW (ref 13.0–17.0)
MCH: 29.8 pg (ref 26.0–34.0)
MCHC: 34.9 g/dL (ref 30.0–36.0)
MCV: 85.4 fL (ref 80.0–100.0)
Platelets: 418 10*3/uL — ABNORMAL HIGH (ref 150–400)
RBC: 3.02 MIL/uL — ABNORMAL LOW (ref 4.22–5.81)
RDW: 13.1 % (ref 11.5–15.5)
WBC: 28.1 10*3/uL — ABNORMAL HIGH (ref 4.0–10.5)
nRBC: 0 % (ref 0.0–0.2)

## 2022-01-15 LAB — URINALYSIS, ROUTINE W REFLEX MICROSCOPIC
Bacteria, UA: NONE SEEN
Bilirubin Urine: NEGATIVE
Glucose, UA: 150 mg/dL — AB
Ketones, ur: NEGATIVE mg/dL
Leukocytes,Ua: NEGATIVE
Nitrite: NEGATIVE
Protein, ur: >=300 mg/dL — AB
Specific Gravity, Urine: 1.012 (ref 1.005–1.030)
pH: 5 (ref 5.0–8.0)

## 2022-01-15 LAB — MAGNESIUM: Magnesium: 1.9 mg/dL (ref 1.7–2.4)

## 2022-01-15 LAB — SEDIMENTATION RATE: Sed Rate: 119 mm/hr — ABNORMAL HIGH (ref 0–16)

## 2022-01-15 LAB — PROCALCITONIN: Procalcitonin: 0.3 ng/mL

## 2022-01-15 MED ORDER — INSULIN ASPART 100 UNIT/ML IJ SOLN
3.0000 [IU] | Freq: Once | INTRAMUSCULAR | Status: AC
  Administered 2022-01-15: 3 [IU] via SUBCUTANEOUS

## 2022-01-15 MED ORDER — MELATONIN 3 MG PO TABS
3.0000 mg | ORAL_TABLET | Freq: Once | ORAL | Status: AC | PRN
  Administered 2022-01-15: 3 mg via ORAL
  Filled 2022-01-15: qty 1

## 2022-01-15 MED ORDER — DIPHENHYDRAMINE HCL 25 MG PO CAPS
25.0000 mg | ORAL_CAPSULE | ORAL | Status: DC | PRN
  Administered 2022-01-15: 25 mg via ORAL
  Filled 2022-01-15: qty 1

## 2022-01-15 NOTE — Progress Notes (Signed)
   Heart Failure Stewardship Pharmacist Progress Note   PCP: Iona Beard, MD PCP-Cardiologist: Freada Bergeron, MD    HPI:  46 yo M with PMH of HTN, HLD, T2DM, and OSA.  Presented to the ED on 12/10 with acute abdominal pain, concerning for acute aortic syndrome. BP was 206/111 on arrival, hypertensive emergency. Found to have 70mm penetrating ulcer in the infrarenal abdominal aorta. Suspected secondary to persistent uncontrolled hypertension. No evidence of aortic aneurysm or dissection. Recommended medical management, goal SBP <140. Started on esmolol and cleviprex gtt. [id number]/[id number] with LVEF of 35 to 40%, G1DD, global hypokinesis but also severe focal hypokinesis of the basal to mid inferior wall, no significant valvular disease. 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 TCTS consulted for CABG. Hospitalization complicated by AKI. With unstable renal function, TCTS recommending PCI instead of CABG. Patient opted for PCI of the culprit RCA, done on 12/20.  Current HF Medications: Beta Blocker: carvedilol 12.5 mg BID Other: hydralazine 50 mg TID + Imdur 30 mg daily  Prior to admission HF Medications: ACE/ARB/ARNI: olmesartan 20 mg daily SGLT2i: Jardiance 10 mg daily  Pertinent Lab Values: Serum creatinine 2.20, BUN 32, Potassium 4.5, Sodium 129, Magnesium 1.9, A1c 9.9   Vital Signs: Weight: 256 lbs (admission weight: 247 lbs) Blood pressure: 140/70s  Heart rate: 70s  I/O: not well documented  Medication Assistance / Insurance Benefits Check: Does the patient have prescription insurance?  Yes Type of insurance plan: [employer]  Outpatient Pharmacy:  Prior to admission outpatient pharmacy: [facility] Is the patient willing to use [facility] at discharge? Yes Is the patient willing to transition their outpatient pharmacy to utilize a [facility] outpatient pharmacy?   Pending    Assessment: 1. Acute systolic CHF (LVEF 59-74%), due to ICM. NYHA class II  symptoms. - Does not appear volume overloaded on exam, has received IV fluids this admission for AKI. LVEDP 23 on cath. - Continue carvedilol 12.5 mg BID - No ACE/ARB/ARNI, MRA, or SGLT2i until renal function stabilization  - Continue hydralazine 50 mg TID + Imdur 30 mg daily   Plan: 1) Medication changes recommended at this time: - None  2) Patient assistance: - Entresto copay $15, copay card lowers to $10  3)  Education  - Initial education completed - Full education to be completed prior to discharge  Kerby Nora, PharmD, BCPS Heart Failure Cytogeneticist Phone [phone]

## 2022-01-15 NOTE — Telephone Encounter (Signed)
Patient called while in the hospital. He requested a sample Continuous glucose monitoring. A sample was provided.

## 2022-01-15 NOTE — Progress Notes (Addendum)
Rounding Note    Patient Name: Shaun Cummings Date of Encounter: 01/15/2022  [place] Cardiologist: Freada Bergeron, MD   Subjective   Feeling much better this morning. No further chest/abd pain. Has been up walking and felt well. Hopeful to DC home soon.   Inpatient Medications    Scheduled Meds:  aspirin  81 mg Oral Daily   atorvastatin  80 mg Oral Daily   carvedilol  12.5 mg Oral BID WC   clopidogrel  75 mg Oral Daily   docusate sodium  100 mg Oral BID   hydrALAZINE  50 mg Oral Q8H   insulin aspart  0-15 Units Subcutaneous TID WC   insulin aspart  0-5 Units Subcutaneous QHS   insulin aspart  5 Units Subcutaneous TID WC   insulin glargine-yfgn  20 Units Subcutaneous Daily   isosorbide mononitrate  30 mg Oral Daily   lactulose  10 g Oral Daily   pantoprazole  40 mg Oral Q12H   sodium chloride flush  3 mL Intravenous Q12H   sodium chloride flush  3 mL Intravenous Q12H   sodium chloride flush  3 mL Intravenous Q12H   sodium chloride flush  3 mL Intravenous Q12H   Continuous Infusions:  sodium chloride     sodium chloride 10 mL/hr at 01/08/22 2359   sodium chloride     sodium chloride     diphenhydrAMINE     PRN Meds: sodium chloride, sodium chloride, acetaminophen, acetaminophen, diazepam, diphenhydrAMINE, HYDROmorphone (DILAUDID) injection, naLOXone (NARCAN)  injection, ondansetron (ZOFRAN) IV, mouth rinse, polyethylene glycol, sodium chloride flush, sodium chloride flush   Vital Signs    Vitals:   01/14/22 1820 01/14/22 2259 01/15/22 0401 01/15/22 0740  BP: (!) 155/67 (!) 144/74 (!) 143/79 138/64  Pulse: 87 77  73  Resp:  (!) 21  20  Temp:  97.8 F (36.6 C) 98.2 F (36.8 C) 98.5 F (36.9 C)  TempSrc:  Oral Oral Oral  SpO2:  100% 99%   Weight:   116.4 kg   Height:       No intake or output data in the 24 hours ending 01/15/22 0910    01/15/2022    4:01 AM 01/13/2022    6:16 AM 01/12/2022    3:50 AM  Last 3 Weights  Weight (lbs)  256 lb 9.6 oz 257 lb 9.6 oz 258 lb 9.6 oz  Weight (kg) 116.393 kg 116.847 kg 117.3 kg      Telemetry    Sinus Rhythm - Personally Reviewed  ECG    Sinus Rhythm, 73 bpm, LVH - Personally Reviewed  Physical Exam   GEN: No acute distress.   Neck: No JVD Cardiac: RRR, no murmurs, rubs, or gallops.  Respiratory: Clear to auscultation bilaterally. GI: Soft, nontender, non-distended  MS: No edema; No deformity. Right radial/femoral site stable Neuro:  Nonfocal  Psych: Normal affect   Labs    High Sensitivity Troponin:   Recent Labs  Lab 01/04/22 2209 01/05/22 0321 01/05/22 0654 01/05/22 0910 01/12/22 0230  TROPONINIHS 22* 1,719* 4,399* 7,294* 448*     Chemistry Recent Labs  Lab 01/12/22 0230 01/13/22 0158 01/14/22 0141 01/15/22 0228  NA 128* 128* 131* 129*  K 3.6 3.2* 4.3 4.5  CL 96* 97* 99 99  CO2 _0 21*  GLUCOSE 137* 187* 141* 323*  BUN 51* 43* 36* 32*  CREATININE 2.58* 2.38* 2.16* 2.20*  CALCIUM 7.3* 7.3* 7.6* 7.8*  MG  --   --   --  1.9  PROT 5.6* 5.3* 5.3*  --   ALBUMIN 1.9* 1.7* 1.7*  --   AST 30 31 32  --   ALT 31 34 30  --   ALKPHOS 197* 229* 228*  --   BILITOT 0.8 0.5 0.8  --   GFRNONAA 31* 34* 38* 37*  ANIONGAP _0 Lipids No results for input(s): "CHOL", "TRIG", "HDL", "LABVLDL", "LDLCALC", "CHOLHDL" in the last 168 hours.  Hematology Recent Labs  Lab 01/13/22 0158 01/14/22 0141 01/15/22 0228  WBC 19.2* 22.9* 28.1*  RBC 2.83* 2.71* 3.02*  HGB 8.3* 8.1* 9.0*  HCT 23.7* 23.2* 25.8*  MCV 83.7 85.6 85.4  MCH 29.3 29.9 29.8  MCHC 35.0 34.9 34.9  RDW 13.1 13.3 13.1  PLT 337 349 418*   Thyroid No results for input(s): "TSH", "FREET4" in the last 168 hours.  BNPNo results for input(s): "BNP", "PROBNP" in the last 168 hours.  DDimer No results for input(s): "DDIMER" in the last 168 hours.   Radiology    CARDIAC CATHETERIZATION  Result Date: 01/14/2022 Successful PCI of severe stenosis in the mid RCA with a 3.5 x 24 mm  Synergy DES, reducing 95% ulcerated plaque to 0% post PCI, TIMI-3 flow pre and post. Recommendations: Aggressive medical therapy for residual CAD, DAPT with aspirin and clopidogrel x 12 months without interruption.  Follow creatinine closely to evaluate for AKI.  Total contrast 40 cc used for this procedure.    Cardiac Studies   Cath: 01/06/2022    Dist LAD lesion is 50% stenosed.   Prox LAD lesion is 70% stenosed.   Ost RCA to Prox RCA lesion is 75% stenosed.   Prox RCA lesion is 75% stenosed.   Prox RCA to Mid RCA lesion is 95% stenosed.   Mid RCA lesion is 50% stenosed.   1st Mrg lesion is 50% stenosed.   Mid Cx-2 lesion is 70% stenosed.   2nd Mrg lesion is 95% stenosed.   Mid Cx-1 lesion is 50% stenosed.   Severe multivessel CAD with the dominant RCA being the "culprit" vessel contributing to the non-STEMI with 70% diffuse proximal stenosis followed by 70% and 95% thrombotic mid stenosis and 50% distal stenosis.   There is 70% diffuse proximal LAD stenosis with diffuse 50% distal LAD stenosis.   The left circumflex vessel has diffuse 50% stenosis in the small OM 2 vessel with 95% focal stenosis in the OM 3 vessel and bifurcation AV groove 50% stenosis followed by 70% distal stenosis.   LVEDP elevated at 27 mm.   RECOMMENDATION: Surgical consultation will be recommended for CABG revascularization.  Will resume heparin 8 hours post sheath discontinuance.  Initiate medical therapy for significant CAD and aggressive lipid-lowering therapy with target LDL less than 55. Smoking cessation is essential.   Diagnostic Dominance: Right  Echo: 01/05/2022  IMPRESSIONS     1. Left ventricular ejection fraction, by estimation, is 35 to 40%. The  left ventricle has moderately decreased function. The left ventricle  demonstrates regional wall motion abnormalities (see scoring  diagram/findings for description). The left  ventricular internal cavity size was mildly dilated. Left ventricular   diastolic parameters are consistent with Grade I diastolic dysfunction  (impaired relaxation). There is hypokinesis of the left ventricular,  septal wall, anterior wall and lateral wall.   There is severe hypokinesis of the left ventricular, basal-mid inferior  wall.   2. Right ventricular systolic function is normal. The right ventricular  size is normal.  3. Left atrial size was moderately dilated.   4. Right atrial size was mildly dilated.   5. The mitral valve is normal in structure. Trivial mitral valve  regurgitation. No evidence of mitral stenosis.   6. The aortic valve is grossly normal. Aortic valve regurgitation is not  visualized. No aortic stenosis is present.   7. The inferior vena cava is normal in size with greater than 50%  respiratory variability, suggesting right atrial pressure of 3 mmHg.   Comparison(s): No prior Echocardiogram.   Conclusion(s)/Recommendation(s): Reduced LVEF, with global hypokinesis but  also severe focal hypokinesis of basal to mid inferior wall. Results will  be communicated to primary team.   FINDINGS   Left Ventricle: Left ventricular ejection fraction, by estimation, is 35  to 40%. The left ventricle has moderately decreased function. The left  ventricle demonstrates regional wall motion abnormalities. Severe  hypokinesis of the left ventricular,  basal-mid inferior wall. The left ventricular internal cavity size was  mildly dilated. There is borderline left ventricular hypertrophy. Left  ventricular diastolic parameters are consistent with Grade I diastolic  dysfunction (impaired relaxation).   Right Ventricle: The right ventricular size is normal. No increase in  right ventricular wall thickness. Right ventricular systolic function is  normal.   Left Atrium: Left atrial size was moderately dilated.   Right Atrium: Right atrial size was mildly dilated.   Pericardium: There is no evidence of pericardial effusion.   Mitral Valve:  The mitral valve is normal in structure. Trivial mitral  valve regurgitation. No evidence of mitral valve stenosis.   Tricuspid Valve: The tricuspid valve is normal in structure. Tricuspid  valve regurgitation is trivial. No evidence of tricuspid stenosis.   Aortic Valve: The aortic valve is grossly normal. Aortic valve  regurgitation is not visualized. No aortic stenosis is present.   Pulmonic Valve: The pulmonic valve was grossly normal. Pulmonic valve  regurgitation is not visualized. No evidence of pulmonic stenosis.   Aorta: The aortic root, ascending aorta and aortic arch are all  structurally normal, with no evidence of dilitation or obstruction.   Venous: The inferior vena cava is normal in size with greater than 50%  respiratory variability, suggesting right atrial pressure of 3 mmHg.   IAS/Shunts: The atrial septum is grossly normal.   Cath: 01/14/2022  Successful PCI of severe stenosis in the mid RCA with a 3.5 x 24 mm Synergy DES, reducing 95% ulcerated plaque to 0% post PCI, TIMI-3 flow pre and post.   Recommendations: Aggressive medical therapy for residual CAD, DAPT with aspirin and clopidogrel x 12 months without interruption.  Follow creatinine closely to evaluate for AKI.  Total contrast 40 cc used for this procedure.  Diagnostic Dominance: Right  Intervention    Patient Profile     46 y.o. male a hx of HTN, HLD, DM and OSA who is being seen 01/05/2022 for the evaluation of elevated troponin at the request of Dr. Carlis Abbott.  Found to have multivessel CAD, seen by TCTS. Turned down for CABG with renal disease./   Assessment & Plan    NSTEMI CAD -- hsTn peaked at 7294, underwent cardiac cath noted above with multivessel CAD, seen by TCTS with initially plans for CABG pending renal function. This is now on hold as renal function worsened. Now CABG vs PCI. Given renal disease, it was felt PCI would be the best option.  Underwent successful PCI of severe stenosis to  the mid RCA with DES x 1.  Recommendations  for DAPT with aspirin/Plavix for at least 1 year. -- continue ASA, plavix, statin, coreg 12.75m BID   ICM HFrEF -- LVEF 35-40%, global hypokinesis with severe HK in basal-inferior wall -- he is net + 8.9L pedal/hand edema, but received IVF in the setting of AKI. Will defer management to nephrology -- continue coreg, hydralazine, Imdur. GDMT limited with AKI/CKD -- [facility]   Penetrating abd aortic ulcer -- Mural thrombus of abdominal aorta with small penetrating ulcer in the infrarenal abdominal aorta measuring 3 mm with no mural hematoma.  -- Evaluated by VVS with recommendations for pain management and blood pressure control, repeat scan 4 to 6 weeks as long as no recurrent pain.    HTN -- severely hypertensive on admission, requiring esmolol, cleviprex drips -- BP now improved -- continue coreg 12.543mBID, hydralazine to 5070mID, Imdur   AKI -- in the setting of acute illness/hypertensive emergency/contrast -- peak Cr 3.3, now improved to 2.2. GFR improved to 37 -- nephrology following    Abd pain GERD -- Lipase 24, CT abdomen on admission showed no abdominal pathology except abdominal aortic ulcer -- elevated alk phos -- seen by GI, Continue PPI.   Tobacco use -- cessation   Right radial site hematoma -- noted yesterday, initially planned for [place] site much improved today. Will cancel doppler     For questions or updates, please contact [place] consult [email or web address] for contact info under        Signed, LinReino BellisP  01/15/2022, 9:10 AM    I have examined the patient and reviewed assessment and plan and discussed with patient.  Agree with above as stated.  Right arm much improved today.  Right groin site without hematoma.  Excellent result on RCA stent.  Patient feels much better.  Creatinine stable, close to baseline.  Okay to discharge from cardiac standpoint.   Stressed the importance of dual antiplatelet therapy.  JayLarae Grooms

## 2022-01-15 NOTE — Progress Notes (Addendum)
CARDIAC REHAB PHASE I   PRE:  Rate/Rhythm: 74 NSR  BP:  Sitting: 144/83      SaO2:   MODE:  Ambulation: 240 ft   AD:  None  POST:  Rate/Rhythm: 76 NSR  BP:  Sitting: 145/82      SaO2:   Pt amb with handheld assistance, pt denies CP and SOB during amb and was returned to room w/o complaint.   Pt walked with some unsteadiness throughout amb d/t pain from calluses on foot. Recommended pt get shoes with better cushion and/or insoles.   Pt was educated on NSTEMI, stent card, stent location, Plavix and ASA use,, wt restrictions, no baths/daily wash-ups, s/s of infection, ex guidelines (progressive walking and resistance exercise), s/s to stop exercising, NTG use and calling 911, heart healthy diet, diabetic diet, risk factors (T2DM, HLD), and CRPII. Pt  received MI book and materials on exercise, diet and CRPII. Will refer to [facility]. Pt is excited about CRPII.  Pt would like to go home today.   Christen Bame  2:20 PM 01/15/2022    Service time is from 1330 to 1420.

## 2022-01-15 NOTE — Progress Notes (Signed)
Consultation Progress Note   Patient: Shaun Cummings RCV:[id number] DOB: 09-08-1975 DOA: 01/04/2022 DOS: the patient was seen and examined on 01/15/2022 Primary service: Bonnell Public, MD  Brief hospital course: Patient is a 46 year old male with HTN, HLD, OSA, diabetes mellitus type 2 presented with chest pain.  In ED, BP noted to be 206/111, troponin 19 CTA chest abdomen pelvis showed small penetrating ulcer in the infrarenal abdominal aorta measuring 3 mm, no mural hematoma.  No evidence of aortic aneurysm or dissection.  Creatinine 1.9 Vascular surgery was consulted and recommended medical management.  Patient was placed on esmolol drip and was admitted to ICU by PCCM.   Transferred to the floor and TRH assumed care on 01/07/2022  01/15/2022: Patient seen alongside patient's son.  Also updated patient's wife.  Discussed with patient's nurse extensively.  Chronic leukocytosis is reported.  However, it has been worsening of the leukocytosis.  Renal function is gradually improving.  Cardiology team seems to have optimize patient.  Will repeat CBC in the morning.  Renal panel in the morning.  Likely DC in the morning if leukocytosis remains stable.  Meanwhile, we will check urinalysis.   Assessment and Plan: Principal Problem:   Hypertensive emergency -Off esmolol drip, on labetalol PRN with parameters -BP controlled, continue Coreg, amlodipine, hydralazine  01/15/2022: Blood pressure has improved significantly.   Active Problems: Severe multivessel coronary disease, NSTEMI Acute HFrEF -Cardiology was consulted, underwent 2D echo which showed EF of 35 to 40% with hypokinesis of LV septal, anterior and lateral walls.  Severe hypokinesis of the LV basal mid inferior wall, G1 DD. -Underwent cardiac cath which showed severe multivessel disease, cardiology, TCTS -Plans for any intervention currently on hold due to worsening of the renal function. -Management per cardiology and CTVS - has  been having off-and-on chest pain episodes. Required Nitro drip which has been dced -No chest pain. 01/15/2022: Cardiology team is managing.     AKI (acute kidney injury) ([place]) -Creatinine improved to 2.38 -UA negative for UTI, renal ultrasound with no obstructive uropathy.  AKI likely due to contrast nephropathy. -Nephrology following, continue IV fluid hydration, supportive treatment  01/15/2022: Nephrology input is appreciated.  Renal function is slowly improving.  Patient will follow-up with nephrology team on discharge.     Penetrating abdominal aortic ulcer -Initially presented with abdominal/back pain, found to have mural thrombus of abdominal aorta with small penetrating ulcer and infrarenal abdominal aorta measuring 3 mm with no medial hematoma -Evaluated by vascular surgery, recommended pain management, BP control  -Abdominal pain has improved   Acute abdominal pain, history of GERD -Abdominal pain is now resolved -Lipase 24, recent CT abdomen showed no abdominal pathology except abdominal aortic ulcer -Abd XR 12/13 showed nonobstructive bowel gas pattern with moderate volume of stool within the colon, placed on bowel regimen -Continue PPI. GI signed off.     Diabetes mellitus type 2, uncontrolled with hyperglycemia Hemoglobin A1c 11.5 -Continue Semglee 20 units daily, NovoLog 5 units 3 times daily AC, SSI   Recent Labs (last 2 labs)                 Recent Labs    01/10/22 1937 01/11/22 0009 01/11/22 0427 01/11/22 0833 01/11/22 1120 01/11/22 1227  GLUCAP 122* 133* 129* 128* 192* 191*        OSA -Continue CPAP nightly     GERD -Continue PPI   Obesity  Estimated body mass index is 35.85 kg/m as calculated from the following:   Height  as of this encounter: 5\' 11"  (1.803 m).   Weight as of this encounter: 116.6 kg.   Code Status: Full code DVT Prophylaxis:  SCD's Start: 01/06/22 1235 SCDs Start: 01/04/22 2332     Level of Care: Level of care:  Progressive Family Communication: Updated patient's wife at the bedside today   Disposition Plan:      Remains inpatient appropriate:       Procedures:  Cardiac cath Consultants:   Vascular surgery Was admitted by CCM Cardiac thoracic surgery      Subjective:  Patient seen. No new complaints today. Worsening leukocytosis noted.  Patient has baseline chronic leukocytosis.   No fever or chills.    Physical Exam: Vitals:   01/15/22 0401 01/15/22 0740 01/15/22 1133 01/15/22 1642  BP: (!) 143/79 138/64 131/68 (!) 153/79  Pulse:  73 75 76  Resp:  20 20   Temp: 98.2 F (36.8 C) 98.5 F (36.9 C) 98 F (36.7 C) 98.2 F (36.8 C)  TempSrc: Oral Oral Oral Oral  SpO2: 99%  95% 96%  Weight: 116.4 kg     Height:      General: Alert and oriented x 3, NAD Cardiovascular: S1 S2 clear, RRR.  Respiratory: CTAB, no wheezing, rales or rhonchi Gastrointestinal: Soft, nontender, nondistended, NBS Ext: Leg edema bilaterally   Data Reviewed:  Family Communication: Wife.  Time spent: 55 minutes.  Author: Bonnell Public, MD 01/15/2022 4:54 PM  For on call review [email or web address].

## 2022-01-15 NOTE — Progress Notes (Signed)
Came to offer assist with amb and educate but pt in shower. Will f/u.

## 2022-01-15 NOTE — Progress Notes (Signed)
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; doubt additional GN present Most recent UA without hematuria Renal ultrasound reassuring with findings consistent with medical renal disease Acute insult almost certainly is contrast and acute illness /hypertensive emergency Cs/p PCI 12/20, Cr stable today OK for d/c today as long as he can get labs early next week- to ensure Cr OK- can be done with any provider- I will also have him see [place] in clinci in followup for OP management of CKD NSTEMI with LHC showing multivessel disease with plan for CABG vs PCI both on hold pending GFR recovery Hypertensive emergency at admission, improved on oral agents Penetrating aortic ulcer followed by vascular surgery Abdominal pain, question if related #4, GI eval'd; resolved in time Metabolic acidosis stable Mild hyponatremia, monitor Leukocytosis, stable Normocytic anemia Recent tobacco cessation OSA Hyperlipidemia    Subjective:   Cr down, for cardiac cath today.  Says he feels achy.     Objective:   BP 131/68 (BP Location: Left Wrist)   Pulse 75   Temp 98 F (36.7 C) (Oral)   Resp 20   Ht 5\' 11"  (1.803 m)   Wt 116.4 kg   SpO2 95%   BMI 35.79 kg/m   Physical Exam: Gen:NAD, lying in bed CVS: RRR Resp: clear Abd: soft Ext: no LE edema  Labs: BMET Recent Labs  Lab 01/09/22 0207 01/10/22 0949 01/11/22 0141 01/12/22 0230 01/13/22 0158 01/14/22 0141 01/15/22 0228  NA 128* 128* 129* 128* 128* 131* 129*  K 3.1* 3.2* 3.4* 3.6 3.2* 4.3 4.5  CL 94* 94* 94* 96* 97* 99 99  CO2 22 23 21* 22 23 22  21*  GLUCOSE 162* 212* 140* 137* 187* 141* 323*  BUN 51* 57* 58* 51* 43* 36* 32*  CREATININE 3.01* 3.31* 3.02* 2.58* 2.38* 2.16* 2.20*  CALCIUM 7.4* 7.5*  7.3* 7.3* 7.3* 7.6* 7.8*   CBC Recent Labs  Lab 01/12/22 0230 01/13/22 0158 01/14/22 0141 01/15/22 0228  WBC 20.1* 19.2* 22.9* 28.1*  HGB 9.2* 8.3* 8.1* 9.0*  HCT 25.7* 23.7* 23.2* 25.8*  MCV 83.7 83.7 85.6 85.4  PLT 330 337 349 418*      Medications:     aspirin  81 mg Oral Daily   atorvastatin  80 mg Oral Daily   carvedilol  12.5 mg Oral BID WC   clopidogrel  75 mg Oral Daily   docusate sodium  100 mg Oral BID   hydrALAZINE  50 mg Oral Q8H   insulin aspart  0-15 Units Subcutaneous TID WC   insulin aspart  0-5 Units Subcutaneous QHS   insulin aspart  5 Units Subcutaneous TID WC   insulin glargine-yfgn  20 Units Subcutaneous Daily   isosorbide mononitrate  30 mg Oral Daily   lactulose  10 g Oral Daily   pantoprazole  40 mg Oral Q12H   sodium chloride flush  3 mL Intravenous Q12H   sodium chloride flush  3 mL Intravenous Q12H   sodium chloride flush  3 mL Intravenous Q12H   sodium chloride flush  3 mL Intravenous Q12H     Madelon Lips, MD 01/15/2022, 12:05 PM

## 2022-01-15 NOTE — TOC Progression Note (Signed)
Transition of Care [facility]) - Progression Note    Patient Details  Name: Shaun Cummings MRN: [id number] Date of Birth: 07-21-1975  [facility] CM/SW Contact  Graves-Bigelow, Ocie Cornfield, RN Phone Number: 01/15/2022, 11:26 AM  Clinical Narrative: Patient post successful PCI 01-14-22. PTA patient was form home- no home needs identified at this time. Per notes, possible transition home today.      Planned Disposition: Home Barriers to Discharge: No Barriers Identified  Expected Discharge Plan and Services   Discharge Planning Services: CM Consult Post Acute Care Choice: NA Living arrangements for the past 2 months: [place] Determinants of Health (SDOH) Interventions [place]: Low Risk  (01/14/2022)  Transportation Needs: No Transportation Needs (01/14/2022)  Alcohol Screen: Low Risk  (01/14/2022)  Depression (PHQ2-9): Low Risk  (12/16/2021)  Financial Resource Strain: Low Risk  (01/14/2022)  Tobacco Use: Medium Risk (01/15/2022)    Readmission Risk Interventions     No data to display

## 2022-01-15 NOTE — Telephone Encounter (Signed)
Patient called he wanted to make you aware that he is in the hospital. Please call patient if you have any questions

## 2022-01-16 ENCOUNTER — Other Ambulatory Visit (HOSPITAL_COMMUNITY): Payer: Self-pay

## 2022-01-16 DIAGNOSIS — I161 Hypertensive emergency: Secondary | ICD-10-CM | POA: Diagnosis not present

## 2022-01-16 LAB — CBC WITH DIFFERENTIAL/PLATELET
Abs Immature Granulocytes: 1.41 10*3/uL — ABNORMAL HIGH (ref 0.00–0.07)
Basophils Absolute: 0.1 10*3/uL (ref 0.0–0.1)
Basophils Relative: 1 %
Eosinophils Absolute: 0.2 10*3/uL (ref 0.0–0.5)
Eosinophils Relative: 1 %
HCT: 24 % — ABNORMAL LOW (ref 39.0–52.0)
Hemoglobin: 8.4 g/dL — ABNORMAL LOW (ref 13.0–17.0)
Immature Granulocytes: 7 %
Lymphocytes Relative: 15 %
Lymphs Abs: 3.2 10*3/uL (ref 0.7–4.0)
MCH: 30.2 pg (ref 26.0–34.0)
MCHC: 35 g/dL (ref 30.0–36.0)
MCV: 86.3 fL (ref 80.0–100.0)
Monocytes Absolute: 1.2 10*3/uL — ABNORMAL HIGH (ref 0.1–1.0)
Monocytes Relative: 6 %
Neutro Abs: 15.2 10*3/uL — ABNORMAL HIGH (ref 1.7–7.7)
Neutrophils Relative %: 70 %
Platelets: 435 10*3/uL — ABNORMAL HIGH (ref 150–400)
RBC: 2.78 MIL/uL — ABNORMAL LOW (ref 4.22–5.81)
RDW: 13.3 % (ref 11.5–15.5)
WBC: 21.3 10*3/uL — ABNORMAL HIGH (ref 4.0–10.5)
nRBC: 0 % (ref 0.0–0.2)

## 2022-01-16 LAB — RENAL FUNCTION PANEL
Albumin: 1.8 g/dL — ABNORMAL LOW (ref 3.5–5.0)
Anion gap: 10 (ref 5–15)
BUN: 33 mg/dL — ABNORMAL HIGH (ref 6–20)
CO2: 22 mmol/L (ref 22–32)
Calcium: 8.2 mg/dL — ABNORMAL LOW (ref 8.9–10.3)
Chloride: 102 mmol/L (ref 98–111)
Creatinine, Ser: 2.08 mg/dL — ABNORMAL HIGH (ref 0.61–1.24)
GFR, Estimated: 40 mL/min — ABNORMAL LOW (ref >60)
Glucose, Bld: 166 mg/dL — ABNORMAL HIGH (ref 70–99)
Phosphorus: 4.1 mg/dL (ref 2.5–4.6)
Potassium: 4 mmol/L (ref 3.5–5.1)
Sodium: 134 mmol/L — ABNORMAL LOW (ref 135–145)

## 2022-01-16 LAB — CULTURE, BLOOD (ROUTINE X 2)
Culture: NO GROWTH
Culture: NO GROWTH
Special Requests: ADEQUATE

## 2022-01-16 LAB — LIPOPROTEIN A (LPA): Lipoprotein (a): 60.5 nmol/L — ABNORMAL HIGH (ref <75.0)

## 2022-01-16 LAB — GLUCOSE, CAPILLARY
Glucose-Capillary: 226 mg/dL — ABNORMAL HIGH (ref 70–99)
Glucose-Capillary: 207 mg/dL — ABNORMAL HIGH (ref 70–99)

## 2022-01-16 MED ORDER — PANTOPRAZOLE SODIUM 40 MG PO TBEC
40.0000 mg | DELAYED_RELEASE_TABLET | Freq: Two times a day (BID) | ORAL | 0 refills | Status: DC
  Filled 2022-01-16: qty 60, 30d supply, fill #0

## 2022-01-16 MED ORDER — POLYETHYLENE GLYCOL 3350 17 GM/SCOOP PO POWD
17.0000 g | Freq: Every day | ORAL | 0 refills | Status: DC | PRN
  Filled 2022-01-16: qty 238, 14d supply, fill #0

## 2022-01-16 MED ORDER — CLOPIDOGREL BISULFATE 75 MG PO TABS
75.0000 mg | ORAL_TABLET | Freq: Every day | ORAL | 0 refills | Status: DC
  Filled 2022-01-16: qty 30, 30d supply, fill #0

## 2022-01-16 MED ORDER — ASPIRIN 81 MG PO CHEW
81.0000 mg | CHEWABLE_TABLET | Freq: Every day | ORAL | 0 refills | Status: DC
  Filled 2022-01-16: qty 30, 30d supply, fill #0

## 2022-01-16 MED ORDER — HYDRALAZINE HCL 50 MG PO TABS
50.0000 mg | ORAL_TABLET | Freq: Three times a day (TID) | ORAL | 0 refills | Status: DC
  Filled 2022-01-16: qty 90, 30d supply, fill #0

## 2022-01-16 MED ORDER — ISOSORBIDE MONONITRATE ER 30 MG PO TB24
30.0000 mg | ORAL_TABLET | Freq: Every day | ORAL | 0 refills | Status: DC
  Filled 2022-01-16: qty 30, 30d supply, fill #0

## 2022-01-16 MED ORDER — DOCUSATE SODIUM 100 MG PO CAPS
100.0000 mg | ORAL_CAPSULE | Freq: Two times a day (BID) | ORAL | 0 refills | Status: DC
  Filled 2022-01-16: qty 10, 5d supply, fill #0

## 2022-01-16 MED ORDER — THE SENSUOUS HEART BOOK
Freq: Once | Status: AC
  Filled 2022-01-16: qty 1

## 2022-01-16 MED ORDER — INSULIN GLARGINE 100 UNIT/ML SOLOSTAR PEN
20.0000 [IU] | PEN_INJECTOR | Freq: Every day | SUBCUTANEOUS | 11 refills | Status: DC
  Filled 2022-01-16: qty 6, 30d supply, fill #0
  Filled 2022-02-12: qty 6, 30d supply, fill #1

## 2022-01-16 MED ORDER — INSULIN PEN NEEDLE 32G X 4 MM MISC
0 refills | Status: DC
  Filled 2022-01-16: qty 100, 30d supply, fill #0

## 2022-01-16 MED ORDER — CARVEDILOL 12.5 MG PO TABS
12.5000 mg | ORAL_TABLET | Freq: Two times a day (BID) | ORAL | 0 refills | Status: DC
  Filled 2022-01-16: qty 60, 30d supply, fill #0

## 2022-01-16 MED ORDER — ATORVASTATIN CALCIUM 80 MG PO TABS
80.0000 mg | ORAL_TABLET | Freq: Every day | ORAL | 0 refills | Status: DC
  Filled 2022-01-16: qty 30, 30d supply, fill #0

## 2022-01-16 NOTE — Progress Notes (Signed)
   Heart Failure Stewardship Pharmacist Progress Note   PCP: Iona Beard, MD PCP-Cardiologist: Freada Bergeron, MD    HPI:  46 yo M with PMH of HTN, HLD, T2DM, and OSA.  Presented to the ED on 12/10 with acute abdominal pain, concerning for acute aortic syndrome. BP was 206/111 on arrival, hypertensive emergency. Found to have 69mm penetrating ulcer in the infrarenal abdominal aorta. Suspected secondary to persistent uncontrolled hypertension. No evidence of aortic aneurysm or dissection. Recommended medical management, goal SBP <140. Started on esmolol and cleviprex gtt. [id number]/41 with LVEF of 35 to 40%, G1DD, global hypokinesis but also severe focal hypokinesis of the basal to mid inferior wall, no significant valvular disease. 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 TCTS consulted for CABG. Hospitalization complicated by AKI. With unstable renal function, TCTS recommending PCI instead of CABG. Patient opted for PCI of the culprit RCA, done on 12/20.  Current HF Medications: Beta Blocker: carvedilol 12.5 mg BID Other: hydralazine 50 mg TID + Imdur 30 mg daily  Prior to admission HF Medications: ACE/ARB/ARNI: olmesartan 20 mg daily SGLT2i: Jardiance 10 mg daily  Pertinent Lab Values: Serum creatinine 2.08, BUN 33, Potassium 4.0, Sodium 134, Magnesium 1.9, A1c 9.9   Vital Signs: Weight: 255 lbs (admission weight: 247 lbs) Blood pressure: 140-150/70s  Heart rate: 60-70s  I/O: not well documented  Medication Assistance / Insurance Benefits Check: Does the patient have prescription insurance?  Yes Type of insurance plan: [employer]  Outpatient Pharmacy:  Prior to admission outpatient pharmacy: [facility] Is the patient willing to use [facility] at discharge? Yes Is the patient willing to transition their outpatient pharmacy to utilize a [facility] outpatient pharmacy?   Pending    Assessment: 1. Acute systolic CHF (LVEF 63-84%), due to ICM. NYHA class II  symptoms. - Does not appear volume overloaded on exam, has received IV fluids this admission for AKI. LVEDP 23 on cath. - Continue carvedilol 12.5 mg BID - No ACE/ARB/ARNI, MRA, or SGLT2i until renal function stabilization  - Continue hydralazine 50 mg TID + Imdur 30 mg daily   Plan: 1) Medication changes recommended at this time: - None  2) Patient assistance: - Entresto copay $15, copay card lowers to $10  3)  Education  - Patient has been educated on current HF medications and potential additions to HF medication regimen - Patient verbalizes understanding that over the next few months, these medication doses may change and more medications may be added to optimize HF regimen - Patient has been educated on basic disease state pathophysiology and goals of therapy   Kerby Nora, PharmD, BCPS Heart Failure Stewardship Pharmacist Phone [phone]

## 2022-01-16 NOTE — Discharge Summary (Incomplete)
Physician Discharge Summary  Patient ID: Shaun Cummings MRN: [id number] DOB/AGE: July 22, 1975 46 y.o.  Admit date: 01/04/2022 Discharge date: 01/16/2022  Admission Diagnoses:  Discharge Diagnoses:  Principal Problem:   Hypertensive emergency Active Problems:   AKI (acute kidney injury) ([place])   Elevated troponin   Non-ST elevation (NSTEMI) myocardial infarction [facility])   Abdominal pain   Penetrating atherosclerotic ulcer of aorta ([place])   Discharged Condition: stable  Hospital Course:   Consults: cardiology, GI, nephrology, Cardiothoracic surgery and vascular surgery  Significant Diagnostic Studies: {diagnostics:18242}  Treatments: {Tx:18249}  Discharge Exam: Blood pressure (!) 152/76, pulse 73, temperature (!) 97.5 F (36.4 C), temperature source Oral, resp. rate 18, height _0  (1.803 m), weight 116.1 kg, SpO2 99 %. {physical ACZY:6063016}  Disposition: Discharge disposition: 01-Home or Self Care       Discharge Instructions     Amb Referral to Cardiac Rehabilitation   Complete by: As directed    Diagnosis:  Coronary Stents NSTEMI     After initial evaluation and assessments completed: Virtual Based Care may be provided alone or in conjunction with Phase 2 Cardiac Rehab based on patient barriers.: Yes   Intensive Cardiac Rehabilitation (ICR) [place] location only OR Traditional Cardiac Rehabilitation (TCR) *If criteria for ICR are not met will enroll in TCR [facility] only): Yes   Diet - low sodium heart healthy   Complete by: As directed    Increase activity slowly   Complete by: As directed       Allergies as of 01/16/2022       Reactions   Augmentin [amoxicillin-pot Clavulanate] Hives, Itching   Has patient had a PCN reaction causing immediate rash, facial/tongue/throat swelling, SOB or lightheadedness with hypotension: Yes Has patient had a PCN reaction causing severe rash involving mucus membranes or skin necrosis: No Has patient had a PCN reaction that  required hospitalization: No Has patient had a PCN reaction occurring within the last 10 years: No If all of the above answers are "NO", then may proceed with Cephalosporin use.   Contrast Media [iodinated Contrast Media] Hives   Pt broke out with 1 hive on forehead after CT injection-treated with 62m Benadryl        Medication List     STOP taking these medications    amlodipine-olmesartan 10-20 MG tablet Commonly known as: AZOR   predniSONE 50 MG tablet Commonly known as: DELTASONE       TAKE these medications    aspirin 81 MG chewable tablet Chew 1 tablet (81 mg total) by mouth daily. Start taking on: January 17, 2022   atorvastatin 80 MG tablet Commonly known as: LIPITOR Take 1 tablet (80 mg total) by mouth daily. Start taking on: January 17, 2022   carvedilol 12.5 MG tablet Commonly known as: COREG Take 1 tablet (12.5 mg total) by mouth 2 (two) times daily with a meal.   clopidogrel 75 MG tablet Commonly known as: PLAVIX Take 1 tablet (75 mg total) by mouth daily. Start taking on: January 17, 2022   docusate sodium 100 MG capsule Commonly known as: COLACE Take 1 capsule (100 mg total) by mouth 2 (two) times daily.   empagliflozin 10 MG Tabs tablet Commonly known as: Jardiance Take 1 tablet (10 mg total) by mouth daily before breakfast.   FreeStyle Libre 3 Sensor Misc 1 Units by Does not apply route every 14 (fourteen) days. Place 1 sensor on the skin every 14 days. Use to check glucose continuously   glipiZIDE 5 MG tablet  Commonly known as: GLUCOTROL Take 1 tablet (5 mg total) by mouth daily before breakfast.   hydrALAZINE 50 MG tablet Commonly known as: APRESOLINE Take 1 tablet (50 mg total) by mouth every 8 (eight) hours.   insulin glargine-yfgn 100 UNIT/ML injection Commonly known as: SEMGLEE Inject 0.2 mLs (20 Units total) into the skin daily. Start taking on: January 17, 2022   isosorbide mononitrate 30 MG 24 hr tablet Commonly known  as: IMDUR Take 1 tablet (30 mg total) by mouth daily. Start taking on: January 17, 2022   pantoprazole 40 MG tablet Commonly known as: PROTONIX Take 1 tablet (40 mg total) by mouth every 12 (twelve) hours.   polyethylene glycol 17 g packet Commonly known as: MIRALAX / GLYCOLAX Take 17 g by mouth daily as needed for moderate constipation.        Follow-up Information     [facility] Follow up.   Why: 4-6 weeks. The office will call the patient with an appointment Contact information: [place]. Go in 13 day(s).   Specialty: Cardiology Why: Hospital follow up 01/30/22 @ 11 am PLEASE bring a current medication list to appointment FREE valet parking, Entrance C, off [employer] information: [address] [id number] [place] [phone]                Signed: Bonnell Public 01/16/2022, 11:44 AM

## 2022-01-16 NOTE — Progress Notes (Signed)
CARDIAC REHAB PHASE I   PRE:  Rate/Rhythm: NSR, HR 76    BP: sitting 146/76    SaO2: 99% RA  MODE:  Ambulation: 470 ft   POST:  Rate/Rhythm: NSR, 82    BP: sitting 156/80     SaO2: 100%, RA    Pt sitting in bed agreed to ambulate with RN. Pt denied CP, SOB while walking and pt returned to edge of bed.  Education to patient and wife was reiterated about heart attack, post wound education and restrictions, heart healthy diet, exercise, nitro, medication (ASA and plavix importance) and follow up MD appointment compliance. Pt was also educated on Heart Failure and book provided. Pt asked questions about sex, education provided, and sensuous heart book ordered for patient. Patient's primary RN informed that book will come from pharmacy. All questions were answered and CHF information book provided. Pt is already referred to cardiac rehab at [facility]. Pt and wife understand without assistance. Call bell in reach, no other needs expressed.    Service time is from 0840 to [place], RN, BSN 01/16/2022 9:29 AM

## 2022-01-16 NOTE — Inpatient Diabetes Management (Signed)
Inpatient Diabetes Program Recommendations  AACE/ADA: New Consensus Statement on Inpatient Glycemic Control (2015)  Target Ranges:  Prepandial:   less than 140 mg/dL      Peak postprandial:   less than 180 mg/dL (1-2 hours)      Critically ill patients:  140 - 180 mg/dL   Lab Results  Component Value Date   GLUCAP 226 (H) 01/16/2022   HGBA1C 9.9 (H) 01/06/2022    Review of Glycemic Control  Latest Reference Range & Units 01/15/22 11:31 01/15/22 16:39 01/15/22 21:15 01/16/22 07:48  Glucose-Capillary 70 - 99 mg/dL 181 (H) 164 (H) 177 (H) 226 (H)  (H): Data is abnormally high Diabetes history: Type 2 DM Outpatient Diabetes medications: Glipizide 5 mg QD, Jardiance 10 mg QD Current orders for Inpatient glycemic control: Semglee 20 units QD, Novolog 0-15 & Hs, Novolog 5 units TID  Inpatient Diabetes Program Recommendations:    Consider increasing Semglee to 24 units QD.   Thanks, Bronson Curb, MSN, RNC-OB Diabetes Coordinator [phone] (8a-5p)

## 2022-01-16 NOTE — TOC Transition Note (Signed)
Transition of Care [facility]) - CM/SW Discharge Note   Patient Details  Name: [name] MRN: [id number] Date of Birth: 1975-07-25  [facility] CM/SW Contact:  Zenon Mayo, RN Phone Number: 01/16/2022, 11:45 AM   Clinical Narrative:    Patient is for dc today, has no needs.    Final next level of care: Home/Self Care Barriers to Discharge: No Barriers Identified   Patient Goals and CMS Choice      Discharge Placement                         Discharge Plan and Services Additional resources added to the After Visit Summary for     Discharge Planning Services: CM Consult Post Acute Care Choice: NA                               Social Determinants of Health (SDOH) Interventions [place]: Low Risk  (01/14/2022)  Transportation Needs: No Transportation Needs (01/14/2022)  Alcohol Screen: Low Risk  (01/14/2022)  Depression (PHQ2-9): Low Risk  (12/16/2021)  Financial Resource Strain: Low Risk  (01/14/2022)  Tobacco Use: Medium Risk (01/15/2022)     Readmission Risk Interventions     No data to display

## 2022-01-16 NOTE — Progress Notes (Signed)
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; doubt additional GN present Most recent UA without hematuria Renal ultrasound reassuring with findings consistent with medical renal disease Acute insult almost certainly is contrast and acute illness /hypertensive emergency Cs/p PCI 12/20, Cr stable today OK for d/c today as long as he can get labs early next week- to ensure Cr OK- can be done with any provider- I will also have him see [place] in clinci in followup for OP management of CKD NSTEMI with LHC showing multivessel disease with plan for CABG vs PCI both on hold pending GFR recovery Hypertensive emergency at admission, improved on oral agents Penetrating aortic ulcer followed by vascular surgery Abdominal pain, question if related #4, GI eval'd; resolved in time Metabolic acidosis stable Mild hyponatremia, monitor Leukocytosis, stable Normocytic anemia Recent tobacco cessation OSA Hyperlipidemia    Subjective:    For d/c today.  Cr stable   Objective:   BP 137/69 (BP Location: Right Arm)   Pulse 72   Temp (!) 97.5 F (36.4 C) (Oral)   Resp 16   Ht 5\' 11"  (1.803 m)   Wt 116.1 kg   SpO2 97%   BMI 35.69 kg/m   Physical Exam: Gen:NAD, lying in bed CVS: RRR Resp: clear Abd: soft Ext: no LE edema  Labs: BMET Recent Labs  Lab 01/10/22 0949 01/11/22 0141 01/12/22 0230 01/13/22 0158 01/14/22 0141 01/15/22 0228 01/16/22 0157  NA 128* 129* 128* 128* 131* 129* 134*  K 3.2* 3.4* 3.6 3.2* 4.3 4.5 4.0  CL 94* 94* 96* 97* 99 99 102  CO2 23 21* 22 23 22  21* 22  GLUCOSE 212* 140* 137* 187* 141* 323* 166*  BUN 57* 58* 51* 43* 36* 32* 33*  CREATININE 3.31* 3.02* 2.58* 2.38* 2.16* 2.20* 2.08*  CALCIUM 7.5* 7.3* 7.3* 7.3* 7.6* 7.8*  8.2*  PHOS  --   --   --   --   --   --  4.1   CBC Recent Labs  Lab 01/13/22 0158 01/14/22 0141 01/15/22 0228 01/16/22 0155  WBC 19.2* 22.9* 28.1* 21.3*  NEUTROABS  --   --   --  15.2*  HGB 8.3* 8.1* 9.0* 8.4*  HCT 23.7* 23.2* 25.8* 24.0*  MCV 83.7 85.6 85.4 86.3  PLT 337 349 418* 435*      Medications:     aspirin  81 mg Oral Daily   atorvastatin  80 mg Oral Daily   carvedilol  12.5 mg Oral BID WC   clopidogrel  75 mg Oral Daily   docusate sodium  100 mg Oral BID   hydrALAZINE  50 mg Oral Q8H   insulin aspart  0-15 Units Subcutaneous TID WC   insulin aspart  0-5 Units Subcutaneous QHS   insulin aspart  5 Units Subcutaneous TID WC   insulin glargine-yfgn  20 Units Subcutaneous Daily   isosorbide mononitrate  30 mg Oral Daily   lactulose  10 g Oral Daily   pantoprazole  40 mg Oral Q12H   sodium chloride flush  3 mL Intravenous Q12H     Madelon Lips, MD 01/16/2022, 3:05 PM

## 2022-01-20 ENCOUNTER — Telehealth: Payer: Self-pay | Admitting: *Deleted

## 2022-01-20 NOTE — Telephone Encounter (Signed)
Transition Care Management Unsuccessful Follow-up Telephone Call  Date of discharge and from where:  01/16/2022 Shaun Cummings  Attempts:  1st Attempt  Reason for unsuccessful TCM follow-up call:  Left voice message

## 2022-01-30 ENCOUNTER — Encounter: Payer: Self-pay | Admitting: Student

## 2022-01-30 ENCOUNTER — Ambulatory Visit (HOSPITAL_COMMUNITY)
Admit: 2022-01-30 | Discharge: 2022-01-30 | Disposition: A | Discharge status: Home / Self Care | Payer: 59 | Attending: Physician Assistant | Admitting: Physician Assistant

## 2022-01-30 ENCOUNTER — Encounter (HOSPITAL_COMMUNITY): Payer: Self-pay

## 2022-01-30 VITALS — BP 170/100 | HR 85 | Wt 231.0 lb

## 2022-01-30 DIAGNOSIS — G4733 Obstructive sleep apnea (adult) (pediatric): Secondary | ICD-10-CM | POA: Diagnosis not present

## 2022-01-30 DIAGNOSIS — I255 Ischemic cardiomyopathy: Secondary | ICD-10-CM | POA: Diagnosis not present

## 2022-01-30 DIAGNOSIS — I252 Old myocardial infarction: Secondary | ICD-10-CM | POA: Insufficient documentation

## 2022-01-30 DIAGNOSIS — N179 Acute kidney failure, unspecified: Secondary | ICD-10-CM | POA: Diagnosis not present

## 2022-01-30 DIAGNOSIS — E1165 Type 2 diabetes mellitus with hyperglycemia: Secondary | ICD-10-CM | POA: Diagnosis not present

## 2022-01-30 DIAGNOSIS — I110 Hypertensive heart disease with heart failure: Secondary | ICD-10-CM | POA: Diagnosis not present

## 2022-01-30 DIAGNOSIS — I5020 Unspecified systolic (congestive) heart failure: Secondary | ICD-10-CM | POA: Diagnosis not present

## 2022-01-30 DIAGNOSIS — E782 Mixed hyperlipidemia: Secondary | ICD-10-CM

## 2022-01-30 DIAGNOSIS — I10 Essential (primary) hypertension: Secondary | ICD-10-CM | POA: Diagnosis not present

## 2022-01-30 DIAGNOSIS — I2510 Atherosclerotic heart disease of native coronary artery without angina pectoris: Secondary | ICD-10-CM | POA: Insufficient documentation

## 2022-01-30 DIAGNOSIS — E785 Hyperlipidemia, unspecified: Secondary | ICD-10-CM | POA: Diagnosis not present

## 2022-01-30 DIAGNOSIS — I214 Non-ST elevation (NSTEMI) myocardial infarction: Secondary | ICD-10-CM | POA: Diagnosis not present

## 2022-01-30 DIAGNOSIS — I5022 Chronic systolic (congestive) heart failure: Secondary | ICD-10-CM | POA: Diagnosis not present

## 2022-01-30 LAB — COMPREHENSIVE METABOLIC PANEL
ALT: 24 U/L (ref 0–44)
AST: 22 U/L (ref 15–41)
Albumin: 2.6 g/dL — ABNORMAL LOW (ref 3.5–5.0)
Alkaline Phosphatase: 121 U/L (ref 38–126)
Anion gap: 9 (ref 5–15)
BUN: 22 mg/dL — ABNORMAL HIGH (ref 6–20)
CO2: 25 mmol/L (ref 22–32)
Calcium: 9.1 mg/dL (ref 8.9–10.3)
Chloride: 104 mmol/L (ref 98–111)
Creatinine, Ser: 1.65 mg/dL — ABNORMAL HIGH (ref 0.61–1.24)
GFR, Estimated: 52 mL/min — ABNORMAL LOW (ref >60)
Glucose, Bld: 118 mg/dL — ABNORMAL HIGH (ref 70–99)
Potassium: 4.2 mmol/L (ref 3.5–5.1)
Sodium: 138 mmol/L (ref 135–145)
Total Bilirubin: 0.4 mg/dL (ref 0.3–1.2)
Total Protein: 7.1 g/dL (ref 6.5–8.1)

## 2022-01-30 LAB — BRAIN NATRIURETIC PEPTIDE: B Natriuretic Peptide: 433.9 pg/mL — ABNORMAL HIGH (ref 0.0–100.0)

## 2022-01-30 MED ORDER — NITROGLYCERIN 0.4 MG SL SUBL: 0.4000 mg | SUBLINGUAL_TABLET | SUBLINGUAL | 3 refills | Status: DC | PRN

## 2022-01-30 MED ORDER — HYDRALAZINE HCL 100 MG PO TABS: 100.0000 mg | ORAL_TABLET | Freq: Three times a day (TID) | ORAL | 6 refills | Status: DC

## 2022-01-30 NOTE — Progress Notes (Addendum)
HEART & VASCULAR TRANSITION OF CARE CONSULT NOTE     Referring Physician: Dr. Marthenia Rolling Primary Care: Dr. Evette Doffing Primary Cardiologist: Establishing  HPI: Referred to clinic by Dr. Marthenia Rolling with [facility] for heart failure consultation. 47 y.o. male with history of HLD, HTN, HLD, OSA, tobacco use, family hx premature CAD.   Presented 01/04/22 with uncontrolled HTN and symptoms concerning for acute aortic syndrome. HS troponin up to 7294. CTA C/A/P w/ mural thrombus involving abdominal aorta and small, chronic appearing penetrating ulcer in infrarenal abdominal aorta. VVS consulted and recommended conservative management. He was admitted to ICU for management of hypertensive emergency.   Echo: EF 35-40%, WMA involving basal to mid inferior wall.   LHC demonstrated severe 3v CAD. He was seen by TCTS and felt to be an appropriate candidate for CABG. Surgery delayed d/t AKI. Nephrology consulted. 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 It was later decided to proceed with PCI rather than CABG. He underwent PCI/DES to m RCA (most critical lesion) on 12/20 by Dr. Burt Knack. GDMT titrated. Scr 1.9 and peaked at 3.3 during admit, improved to 2.1 at discharge (Scr last 1.18 04/23).   He is here today for hospital follow-up. Has been doing okay. Notes a lot of fatigue. No dyspnea, orthopnea, PND or lower extremity edema. No symptoms concerning for angina. Home weight has been stable. Reports adherence with medications. He is watching fluid and sodium intake.   He ordered a BP cuff through his insurance, should be arriving to his home soon.  Works at [employer]. Has been off work since hospitalization.  Quit smoking several weeks ago. Denies ETOH use.   Review of Systems: [y] = yes, [ ]  = no   General: Weight gain [ ] ; Weight loss [ ] ; Anorexia [ ] ; Fatigue [Y]; Fever [ ] ; Chills [ ] ; Weakness [ ]   Cardiac: Chest pain/pressure [ ] ;  Resting SOB [ ] ; Exertional SOB [ ] ; Orthopnea [ ] ; Pedal Edema [ ] ; Palpitations [ ] ; Syncope [ ] ; Presyncope [ ] ; Paroxysmal nocturnal dyspnea[ ]   Pulmonary: Cough [ ] ; Wheezing[ ] ; Hemoptysis[ ] ; Sputum [ ] ; Snoring [ ]   GI: Vomiting[ ] ; Dysphagia[ ] ; Melena[ ] ; Hematochezia [ ] ; Heartburn[ ] ; Abdominal pain [ ] ; Constipation [ ] ; Diarrhea [ ] ; BRBPR [ ]   GU: Hematuria[ ] ; Dysuria [ ] ; Nocturia[ ]   Vascular: Pain in legs with walking [ ] ; Pain in feet with lying flat [ ] ; Non-healing sores [ ] ; Stroke [ ] ; TIA [ ] ; Slurred speech [ ] ;  Neuro: Headaches[ ] ; Vertigo[ ] ; Seizures[ ] ; Paresthesias[ ] ;Blurred vision [ ] ; Diplopia [ ] ; Vision changes [ ]   Ortho/Skin: Arthritis [ ] ; Joint pain [ ] ; Muscle pain [ ] ; Joint swelling [ ] ; Back Pain [ ] ; Rash [ ]   Psych: Depression[ ] ; Anxiety[ ]   Heme: Bleeding problems [ ] ; Clotting disorders [ ] ; Anemia [ ]   Endocrine: Diabetes [Y]; Thyroid dysfunction[ ]    Past Medical History:  Diagnosis Date   Chest pain    HTN (hypertension)    Hyperlipidemia    Lesion of penis    foreskin   OSA (obstructive sleep apnea)    per pt dx osa and used cpap but after losing wt. from 415 pounds down to 244 pounds no longer needs cpap   Peripheral neuropathy    Phimosis    Scrotal abscess 05/08/2017   Type 2 diabetes mellitus ([place])  per pt no meds for two years pt was changed to levemir unable to afford but pt states is waiting on approval for a program he applied for that will pay for his meds [facility])      Current Outpatient Medications  Medication Sig Dispense Refill   aspirin 81 MG chewable tablet Chew 1 tablet (81 mg total) by mouth daily. 30 tablet 0   atorvastatin (LIPITOR) 80 MG tablet Take 1 tablet (80 mg total) by mouth daily. 30 tablet 0   carvedilol (COREG) 12.5 MG tablet Take 1 tablet (12.5 mg total) by mouth 2 (two) times daily with a meal. 60 tablet 0   clopidogrel (PLAVIX) 75 MG tablet Take 1 tablet (75 mg total) by mouth daily.  30 tablet 0   docusate sodium (COLACE) 100 MG capsule Take 1 capsule (100 mg total) by mouth 2 (two) times daily. 10 capsule 0   empagliflozin (JARDIANCE) 10 MG TABS tablet Take 1 tablet (10 mg total) by mouth daily before breakfast. 90 tablet 3   glipiZIDE (GLUCOTROL) 5 MG tablet Take 1 tablet (5 mg total) by mouth daily before breakfast. 90 tablet 2   hydrALAZINE (APRESOLINE) 100 MG tablet Take 1 tablet (100 mg total) by mouth 3 (three) times daily. 90 tablet 6   insulin glargine (LANTUS) 100 UNIT/ML Solostar Pen Inject 20 Units into the skin daily. 15 mL 11   Insulin Pen Needle 32G X 4 MM MISC Use with Basaglar 100 each 0   isosorbide mononitrate (IMDUR) 30 MG 24 hr tablet Take 1 tablet (30 mg total) by mouth daily. 30 tablet 0   nitroGLYCERIN (NITROSTAT) 0.4 MG SL tablet Place 1 tablet (0.4 mg total) under the tongue every 5 (five) minutes as needed for chest pain. 90 tablet 3   pantoprazole (PROTONIX) 40 MG tablet Take 1 tablet (40 mg total) by mouth every 12 (twelve) hours. 60 tablet 0   polyethylene glycol powder (GLYCOLAX/MIRALAX) 17 GM/SCOOP powder Take 1 capful (17 g) by mouth daily as needed for moderate constipation. 238 g 0   No current facility-administered medications for this encounter.    Allergies  Allergen Reactions   Augmentin [Amoxicillin-Pot Clavulanate] Hives and Itching    Has patient had a PCN reaction causing immediate rash, facial/tongue/throat swelling, SOB or lightheadedness with hypotension: Yes Has patient had a PCN reaction causing severe rash involving mucus membranes or skin necrosis: No Has patient had a PCN reaction that required hospitalization: No Has patient had a PCN reaction occurring within the last 10 years: No If all of the above answers are "NO", then may proceed with Cephalosporin use.   Contrast Media [Iodinated Contrast Media] Hives    Pt broke out with 1 hive on forehead after CT injection-treated with 25mg  Benadryl      Social History    Socioeconomic History   Marital status: Married    Spouse name: Jerral Bonito   Number of children: 2   Years of education: college    Highest education level: Not on file  Occupational History   Occupation: [employer]   Tobacco Use   Smoking status: Former    Years: 2.00    Types: Cigarettes    Quit date: 02/05/2008    Years since quitting: 13.9   Smokeless tobacco: Never  Vaping Use   Vaping Use: Never used  Substance and Sexual Activity   Alcohol use: No   Drug use: Not Currently    Types: Marijuana    Comment: quit  1 month ago.   Sexual activity: Not on file  Other Topics Concern   Not on file  Social History Narrative   Lives with wife and 2 kids, age 61 and 32.    Social Determinants of Health   Financial Resource Strain: Low Risk  (01/14/2022)   Overall Financial Resource Strain (CARDIA)    Difficulty of Paying Living Expenses: Not hard at all  Food Insecurity: Not on file  Transportation Needs: No Transportation Needs (01/14/2022)   PRAPARE - Hydrologist (Medical): No    Lack of Transportation (Non-Medical): No  Physical Activity: Not on file  Stress: Not on file  Social Connections: Not on file  Intimate Partner Violence: Not on file      Family History  Problem Relation Age of Onset   Diabetes Mother    Angina Mother    CAD Mother    Heart disease Mother    Glaucoma Father    Diabetes Father    Heart attack Father    Kidney failure Father    Diabetes Brother    Glaucoma Maternal Grandmother    Diabetes Maternal Grandmother    Heart disease Maternal Grandmother    Glaucoma Maternal Grandfather    Diabetes Maternal Grandfather    Heart disease Maternal Grandfather    Glaucoma Paternal Grandmother    Diabetes Paternal Grandmother    Glaucoma Paternal Grandfather    Diabetes Paternal Grandfather    Asthma Son     Vitals:   01/30/22 1055  BP: (!) 170/100  Pulse: 85  SpO2: 98%  Weight: 104.8 kg (231 lb)     PHYSICAL EXAM: General:  Well appearing. Ambulated into clinic. Wife present HEENT: normal Neck: supple. no JVD. Carotids 2+ bilat; no bruits.  Cor: PMI nondisplaced. Regular rate & rhythm. No rubs, gallops or murmurs. Lungs: clear Abdomen: soft, nontender, nondistended.  Extremities: no cyanosis, clubbing, rash, edema Neuro: alert & oriented x 3, cranial nerves grossly intact. moves all 4 extremities w/o difficulty. Affect pleasant.  ECG: SR 86 bpm, ST/T wave changes inferolateral leads (also noted on ECGs during recent admit)   ASSESSMENT & PLAN: HFrEF/ICM -Echo 12/23: EF 35-40%, WMA involving basal to mid inferior wall.  -LHC 12/23 in setting of NSTEMI: Culprit vessel RCA with 70% p stenosis, 70% and 95% m stenosis, 70% p LAD, OM1 50%, OM2 95%, 70% m LCX -S/p PCI/DES to RCA -Currently NYHA II/early III -Volume looks good on exam. Not requiring diuretic -BB- Continue coreg 12.5 mg BID -Increase hydralazine to 100 mg TID -ARNI and MRA- Would like to eventually add  once renal function recovers -SGLT2i- Jardiance 10 mg daily, use caution with hx groin abscess -CMET, BNP today -Will need repeat echo once a maximally tolerated GDMT -Encouraged him to start cardiac rehab  CAD with recent NSTEMI -Very strong family hx of premature CAD -3 vCAD on cath 12/23 as above -s/p PCI/DES to RCA ("culprit" lesion) -Had initially planned for CABG but later deferred d/t AKI with Scr up to 3.3 -No symptoms concerning for angina -Continue DAPT with aspirin and plavix. High-intensity statin. -Follow with Cardiology. Will arrange referral  HTN -BP not controlled -Increased hydralazine as above  Recent AKI -Baseline Scr unclear. 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 Managing medically  HLD -Started on 80 mg Atorvastatin daily during recent admit -Goal LDL less than 50   Tobacco abuse -Reports he has not smoked cigarettes since discharge. He was congratulated on this.    Referred to HFSW (PCP, Medications, Transportation, ETOH Abuse, Drug Abuse, Insurance, Financial ): No Refer to Pharmacy: No Refer to Home Health: No Refer to Advanced Heart Failure Clinic: Yes Refer to General Cardiology: Yes  Follow up Dr. Daniel Nones in 2-3 weeks, will follow along with General Cardiology

## 2022-01-30 NOTE — Telephone Encounter (Signed)
Called to confirm Heart & Vascular Transitions of Care appointment at 11 am on 01/30/22. Patient reminded to bring all medications and pill box organizer with them. Confirmed patient has transportation. Gave directions, instructed to utilize [place] parking.  Confirmed appointment prior to ending call.    Earnestine Leys, BSN, Clinical cytogeneticist Only

## 2022-01-30 NOTE — Patient Instructions (Signed)
Medication Changes:   Change Hydralazine to 100mg , three times daily. Take one tablet, every 8 hours.   Start sublingual nitroglycerin/ SL nitrostat. Only use AS NEEDED for chest pain. Take 1 tablet every 5 minutes for a total of 3 tablets. Place tab under tongue to dissolve.   Lab Work:  Labs done today, your results will be available in MyChart, we will contact you for abnormal readings.  Referrals:  You have already been referred to Cardiac Rehab. You are listed on their intake, please call if you don't hear from them within 1 week [phone]  You have been referred to [facility] with Dr. Daniel Nones.   Special Instructions // Education:  Do the following things EVERYDAY: Weigh yourself in the morning before breakfast. Write it down and keep it in a log. Take your medicines as prescribed Eat low salt foods--Limit salt (sodium) to 2000 mg per day.  Stay as active as you can everyday Limit all fluids for the day to less than 2 liters   FMLA forms for your spouse were received. It takes 7-14 business days for [place] to complete our sections of the forms then you will receive a call to come pick up the originals if needed. We can fax them to your employer if a fax if provided.    Follow-Up in: 3 weeks with Dr. Daniel Nones.     At the [facility], you and your health needs are our priority. We have a designated team specialized in the treatment of Heart Failure. This Care Team includes your primary Heart Failure Specialized Cardiologist (physician), Advanced Practice Providers (APPs- Physician Assistants and Nurse Practitioners), and Pharmacist who all work together to provide you with the care you need, when you need it.   You may see any of the following providers on your designated Care Team at your next follow up:  Dr. Glori Bickers Dr. Loralie Champagne Dr. Roxana Hires, NP Lyda Jester, [place] [facility] [place], [place] Forestine Na, NP Audry Riles, PharmD   Please be sure to bring in all your medications bottles to every appointment.   Need to Contact us:  If you have any questions or concerns before your next appointment please send [place] a message through Institute or call our office at [phone].    TO LEAVE A MESSAGE FOR THE NURSE SELECT OPTION 2, PLEASE LEAVE A MESSAGE INCLUDING: YOUR NAME DATE OF BIRTH CALL BACK NUMBER REASON FOR CALL**this is important as we prioritize the call backs  YOU WILL RECEIVE A CALL BACK THE SAME DAY AS LONG AS YOU CALL BEFORE 4:00 PM

## 2022-02-12 ENCOUNTER — Other Ambulatory Visit: Payer: Self-pay | Admitting: Internal Medicine

## 2022-02-12 ENCOUNTER — Other Ambulatory Visit: Payer: Self-pay

## 2022-02-12 ENCOUNTER — Other Ambulatory Visit (HOSPITAL_COMMUNITY): Payer: Self-pay

## 2022-02-12 ENCOUNTER — Other Ambulatory Visit: Payer: Self-pay | Admitting: Student

## 2022-02-13 ENCOUNTER — Other Ambulatory Visit: Payer: Self-pay

## 2022-02-13 ENCOUNTER — Other Ambulatory Visit (HOSPITAL_COMMUNITY): Payer: Self-pay | Admitting: *Deleted

## 2022-02-13 MED ORDER — HYDRALAZINE HCL 100 MG PO TABS: 100.0000 mg | ORAL_TABLET | Freq: Three times a day (TID) | ORAL | 6 refills | Status: DC

## 2022-02-13 MED ORDER — CARVEDILOL 12.5 MG PO TABS: 12.5000 mg | ORAL_TABLET | Freq: Two times a day (BID) | ORAL | 0 refills | Status: DC

## 2022-02-13 MED ORDER — EMPAGLIFLOZIN 10 MG PO TABS: 10.0000 mg | ORAL_TABLET | Freq: Every day | ORAL | 3 refills | Status: DC

## 2022-02-13 MED ORDER — ATORVASTATIN CALCIUM 80 MG PO TABS: 80.0000 mg | ORAL_TABLET | Freq: Every day | ORAL | 0 refills | Status: DC

## 2022-02-13 MED ORDER — CLOPIDOGREL BISULFATE 75 MG PO TABS: 75.0000 mg | ORAL_TABLET | Freq: Every day | ORAL | 0 refills | Status: DC

## 2022-02-13 MED ORDER — PANTOPRAZOLE SODIUM 40 MG PO TBEC: 40.0000 mg | DELAYED_RELEASE_TABLET | Freq: Two times a day (BID) | ORAL | 0 refills | Status: DC

## 2022-02-13 MED ORDER — ISOSORBIDE MONONITRATE ER 30 MG PO TB24: 30.0000 mg | ORAL_TABLET | Freq: Every day | ORAL | 0 refills | Status: DC

## 2022-02-15 MED ORDER — ASPIRIN 81 MG PO CHEW
81.0000 mg | CHEWABLE_TABLET | Freq: Every day | ORAL | 0 refills | Status: AC
  Filled 2022-02-15: qty 30, 30d supply, fill #0

## 2022-02-16 ENCOUNTER — Other Ambulatory Visit: Payer: Self-pay

## 2022-02-16 ENCOUNTER — Other Ambulatory Visit (HOSPITAL_COMMUNITY): Payer: Self-pay

## 2022-02-17 NOTE — Discharge Summary (Signed)
Physician Discharge Summary  Patient ID: Shaun Cummings MRN: [id number] DOB/AGE: 45-Jun-1977 45 y.o.  Admit date: 01/04/2022 Discharge date: 01/16/2022  Admission Diagnoses:  Discharge Diagnoses:  Principal Problem:   Hypertensive emergency  Active Problems:   AKI (acute kidney injury) ([place])   Elevated troponin   Non-ST elevation (NSTEMI) myocardial infarction ([place])   Multi-vessel coronary artery disease   Metabolic acidosis   Mild hyponatremia   Leukocytosis   OSA   Hyperlipidemia   Abdominal pain   Penetrating atherosclerotic ulcer of aorta ([place])   Discharged Condition: stable  Hospital Course: Patient is a 47 year old male with past medical history significant for hypertension, hyperlipidemia, OSA and type 2 diabetes mellitus.  Patient was admitted with hypertensive emergency (blood pressure of 260/111 mmHg) and chest pain with significantly elevated troponin (greater than 7000).  CT of the chest, abdomen and pelvis done on presentation revealed penetrating ulcer in the infrarenal abdominal aorta measuring 3 mm, serum creatinine of 1.9.  Patient was admitted to critical care/pulmonary service and started on esmolol and clevidipine drip.  Vascular surgery and nephrology team were consulted to assist with patient's management.  Vascular surgery team advised conservative approach, considering prior similar imaging findings a few years earlier.  Patient underwent cardiac catheterization that revealed multivessel coronary artery disease.  Cardiothoracic team was consulted.  The initial plan was to proceed with CABG, however, due to impaired renal function, PCI was done.  Chest pain has resolved.  Renal function is improving.  Vascular surgery will monitor the infrarenal abdominal aorta findings.  GI team was consulted for abdominal pain and PPI was recommended.  Abdominal pain has resolved.  Patient will be discharged back onto the care of the primary care provider.  Patient will follow-up  with primary care provider, and nephrology team, vascular surgery team and cardiology team.  Hypertensive emergency -Patient was admitted to pulmonary/critical care team (ICU) and managed with esmolol and clevidipine drip. -Blood pressure improved and drip was turned off. -Labetalol as needed with parameters.   -BP controlled, continue Coreg, amlodipine, hydralazine  01/15/2022: Blood pressure improved significantly.   Severe multivessel coronary disease, NSTEMI Acute HFrEF: -Cardiology was consulted, underwent 2D echo which showed EF of 35 to 40% with hypokinesis of LV septal, anterior and lateral walls.  Severe hypokinesis of the LV basal mid inferior wall, G1 DD. -Underwent cardiac cath which showed severe multivessel disease, cardiology, TCTS -Initial plan was to proceed with CABG however, CABG was held due to worsening of the renal function.  Patient eventually underwent PCI.   -Management per cardiology and CTVS   AKI (acute kidney injury) on chronic kidney disease -Worsening renal function and mention. -Renal ultrasound revealed findings suggestive of medical renal disease. -Nephrology team directed care. -Patient was exposed to contrast during the hospital stay. -Measures to prevent contrast induced associated nephropathy with taking.  Nephrology input is appreciated. -Renal function has slowly improved. -Patient will follow-up with nephrology team on discharge. -Continue to monitor renal function and electrolytes. -Avoid nephrotoxins.   Penetrating abdominal aortic ulcer -Initially presented with abdominal/back pain, found to have mural thrombus of abdominal aorta with small penetrating ulcer and infrarenal abdominal aorta measuring 3 mm with no medial hematoma -Evaluated by vascular surgery, recommended pain management, BP control  -Abdominal pain has improved   Acute abdominal pain, history of GERD -Abdominal pain is now resolved -Lipase 24, recent CT abdomen showed no  abdominal pathology except abdominal aortic ulcer -Abd XR 12/13 showed nonobstructive bowel gas pattern with moderate volume  of stool within the colon, placed on bowel regimen -Continue PPI. GI signed off.     Diabetes mellitus type 2, uncontrolled with hyperglycemia Hemoglobin A1c 11.5 -Continue Semglee 20 units daily, NovoLog 5 units 3 times daily AC, SSI   Recent Labs (last 2 labs)                 Recent Labs    01/10/22 1937 01/11/22 0009 01/11/22 0427 01/11/22 0833 01/11/22 1120 01/11/22 1227  GLUCAP 122* 133* 129* 128* 192* 191*        OSA -Continue CPAP nightly     GERD -Continue PPI   Obesity  Estimated body mass index is 35.85 kg/m as calculated from the following:   Height as of this encounter: 5\' 11"  (1.803 m).   Weight as of this encounter: 116.6 kg.    Consults: cardiology, pulmonary/intensive care, GI, nephrology, vascular surgery, and cardiothoracic surgery  Significant Diagnostic Studies:  CT angio chest/abdomen/pelvis revealed: 1. No evidence aortic aneurysm or dissection. 2. 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 No mural hematoma. 3. Nonobstructive left renal calculi. 4. Cardiomegaly with coronary artery calcifications. 5. Colonic diverticulosis without diverticulitis.  Renal ultrasound revealed: 1. Mildly increased renal parenchymal echogenicity suggestive of chronic medical renal disease. 2. No obstructive uropathy. 3. Left renal stones on recent CT are not seen by ultrasound.  Discharge Exam: Blood pressure 137/69, pulse 72, temperature (!) 97.5 F (36.4 C), temperature source Oral, resp. rate 16, height 5\' 11"  (1.572 m), weight 116.1 kg, SpO2 97 %.   Disposition: Discharge disposition: 01-Home or Self Care       Discharge Instructions     Amb Referral to Cardiac Rehabilitation   Complete by: As directed    Diagnosis:   Coronary Stents NSTEMI     After initial evaluation and assessments completed: Virtual Based Care may be provided alone or in conjunction with Phase 2 Cardiac Rehab based on patient barriers.: Yes   Intensive Cardiac Rehabilitation (ICR) [place] location only OR Traditional Cardiac Rehabilitation (TCR) *If criteria for ICR are not met will enroll in TCR [facility] only): Yes   Diet - low sodium heart healthy   Complete by: As directed    Increase activity slowly   Complete by: As directed       Allergies as of 01/16/2022       Reactions   Augmentin [amoxicillin-pot Clavulanate] Hives, Itching   Has patient had a PCN reaction causing immediate rash, facial/tongue/throat swelling, SOB or lightheadedness with hypotension: Yes Has patient had a PCN reaction causing severe rash involving mucus membranes or skin necrosis: No Has patient had a PCN reaction that required hospitalization: No Has patient had a PCN reaction occurring within the last 10 years: No If all of the above answers are "NO", then may proceed with Cephalosporin use.   Contrast Media [iodinated Contrast Media] Hives   Pt broke out with 1 hive on forehead after CT injection-treated with 25mg  Benadryl        Medication List     STOP taking these medications    amlodipine-olmesartan 10-20 MG tablet Commonly known as: AZOR   predniSONE 50 MG tablet Commonly known as: DELTASONE       TAKE these medications    Basaglar KwikPen 100 UNIT/ML Inject 20 Units into the skin daily.   docusate sodium 100 MG capsule Commonly known as: COLACE Take 1 capsule (100 mg total)  by mouth 2 (two) times daily.   glipiZIDE 5 MG tablet Commonly known as: GLUCOTROL Take 1 tablet (5 mg total) by mouth daily before breakfast.   polyethylene glycol powder 17 GM/SCOOP powder Commonly known as: GLYCOLAX/MIRALAX Take 1 capful (17 g) by mouth daily as needed for moderate constipation.   TechLite Pen Needles 32G X 4 MM Misc Generic drug:  Insulin Pen Needle Use with Basaglar        Follow-up Information     [facility] Follow up.   Why: 4-6 weeks. The office will call the patient with an appointment Contact information: [address] [place] and [place]. Go in 13 day(s).   Specialty: Cardiology Why: Hospital follow up 01/30/22 @ 11 am PLEASE bring a current medication list to appointment FREE valet parking, Entrance C, off [address] information: [address] [phone] [place] [phone]                Time spent: 37 minutes.  SignedBonnell Public 02/17/2022, 5:36 AM

## 2022-02-18 ENCOUNTER — Telehealth (HOSPITAL_COMMUNITY): Payer: Self-pay | Admitting: Cardiology

## 2022-02-18 DIAGNOSIS — N179 Acute kidney failure, unspecified: Secondary | ICD-10-CM | POA: Diagnosis not present

## 2022-02-18 DIAGNOSIS — E1122 Type 2 diabetes mellitus with diabetic chronic kidney disease: Secondary | ICD-10-CM | POA: Diagnosis not present

## 2022-02-18 DIAGNOSIS — I214 Non-ST elevation (NSTEMI) myocardial infarction: Secondary | ICD-10-CM | POA: Diagnosis not present

## 2022-02-18 DIAGNOSIS — I129 Hypertensive chronic kidney disease with stage 1 through stage 4 chronic kidney disease, or unspecified chronic kidney disease: Secondary | ICD-10-CM | POA: Diagnosis not present

## 2022-02-18 DIAGNOSIS — N1831 Chronic kidney disease, stage 3a: Secondary | ICD-10-CM | POA: Diagnosis not present

## 2022-02-18 NOTE — Telephone Encounter (Signed)
FMLA forms were left on 01/30/22 appt w/ TOC , forms hve not been completed, due date is 02/23/22, Pt wife was informed on 02/16/22, by her employer the forms have not been received, please call pt @[phone] w/update. She stated she left messages 4 times regarding matter w/ triage(Kalish Dondero)

## 2022-02-19 NOTE — Telephone Encounter (Signed)
She called this morning and left a message because she has not heard back about her FMLA paperwork. She asked if someone could call her back today with a status update.

## 2022-02-20 ENCOUNTER — Telehealth (HOSPITAL_COMMUNITY): Payer: Self-pay

## 2022-02-20 NOTE — Telephone Encounter (Signed)
FMLA paperwork faxed. Spouse informed. Leaving copy at front desk for her to collect.

## 2022-02-24 ENCOUNTER — Other Ambulatory Visit (HOSPITAL_COMMUNITY): Payer: Self-pay

## 2022-02-24 ENCOUNTER — Other Ambulatory Visit: Payer: Self-pay

## 2022-02-24 ENCOUNTER — Encounter (HOSPITAL_COMMUNITY): Payer: Self-pay | Admitting: Cardiology

## 2022-02-24 ENCOUNTER — Ambulatory Visit (HOSPITAL_COMMUNITY)
Admission: RE | Admit: 2022-02-24 | Discharge: 2022-02-24 | Disposition: A | Discharge status: Home / Self Care | Payer: 59 | Source: Ambulatory Visit | Attending: Cardiology | Admitting: Cardiology

## 2022-02-24 VITALS — BP 134/78 | HR 74 | Wt 230.0 lb

## 2022-02-24 DIAGNOSIS — E785 Hyperlipidemia, unspecified: Secondary | ICD-10-CM | POA: Insufficient documentation

## 2022-02-24 DIAGNOSIS — I10 Essential (primary) hypertension: Secondary | ICD-10-CM

## 2022-02-24 DIAGNOSIS — N189 Chronic kidney disease, unspecified: Secondary | ICD-10-CM | POA: Insufficient documentation

## 2022-02-24 DIAGNOSIS — Z7902 Long term (current) use of antithrombotics/antiplatelets: Secondary | ICD-10-CM | POA: Insufficient documentation

## 2022-02-24 DIAGNOSIS — Z79899 Other long term (current) drug therapy: Secondary | ICD-10-CM | POA: Diagnosis not present

## 2022-02-24 DIAGNOSIS — Z7984 Long term (current) use of oral hypoglycemic drugs: Secondary | ICD-10-CM | POA: Insufficient documentation

## 2022-02-24 DIAGNOSIS — E1122 Type 2 diabetes mellitus with diabetic chronic kidney disease: Secondary | ICD-10-CM | POA: Insufficient documentation

## 2022-02-24 DIAGNOSIS — Z7982 Long term (current) use of aspirin: Secondary | ICD-10-CM | POA: Diagnosis not present

## 2022-02-24 DIAGNOSIS — E1142 Type 2 diabetes mellitus with diabetic polyneuropathy: Secondary | ICD-10-CM | POA: Diagnosis not present

## 2022-02-24 DIAGNOSIS — I255 Ischemic cardiomyopathy: Secondary | ICD-10-CM | POA: Insufficient documentation

## 2022-02-24 DIAGNOSIS — I130 Hypertensive heart and chronic kidney disease with heart failure and stage 1 through stage 4 chronic kidney disease, or unspecified chronic kidney disease: Secondary | ICD-10-CM | POA: Diagnosis not present

## 2022-02-24 DIAGNOSIS — Z955 Presence of coronary angioplasty implant and graft: Secondary | ICD-10-CM | POA: Insufficient documentation

## 2022-02-24 DIAGNOSIS — I2510 Atherosclerotic heart disease of native coronary artery without angina pectoris: Secondary | ICD-10-CM | POA: Insufficient documentation

## 2022-02-24 DIAGNOSIS — I719 Aortic aneurysm of unspecified site, without rupture: Secondary | ICD-10-CM | POA: Diagnosis not present

## 2022-02-24 DIAGNOSIS — I5020 Unspecified systolic (congestive) heart failure: Secondary | ICD-10-CM

## 2022-02-24 MED ORDER — ENTRESTO 49-51 MG PO TABS: 1.0000 | ORAL_TABLET | Freq: Two times a day (BID) | ORAL | 11 refills | Status: DC

## 2022-02-24 NOTE — Progress Notes (Signed)
Medication Samples have been provided to the patient.  Drug name: Shaun Cummings       Strength: 49/51 mg        Qty: 2  LOT: [id number]  Exp.Date: 11/2023  Dosing instructions: Take 1 tablet Twice daily   The patient has been instructed regarding the correct time, dose, and frequency of taking this medication, including desired effects and most common side effects.   Shaun Cummings 11:10 AM 02/24/2022

## 2022-02-24 NOTE — Progress Notes (Signed)
ADVANCED HEART FAILURE CLINIC NOTE  Referring Physician: Iona Beard, MD  Primary Care: Iona Beard, MD Primary Cardiologist: Establishing Care; PCI by Dr. Burt Knack  HPI: Shaun Cummings is a 47 y.o. male with hypertension, hyperlipidemia, tobacco use, family history of premature CAD that presented to [facility] on January 04, 2022 with uncontrolled hypertension and concern for acute aortic dissection.  During that admission high-sensitivity troponin was up to 7000 he had a CTA chest abdomen pelvis with mural thrombus involving the abdominal aorta and a penetrating ulcer in the infrarenal abdominal aorta.  Vascular surgery was consulted and he was managed conservatively.  He had an echocardiogram during the admission demonstrating LVEF of 35 to 40%, left heart cath with severe three-vessel disease.  Due to concern for multiple core morbidities he underwent high risk PCI with PCI to the mid RCA by Dr. Burt Knack.  Since that time he has been seen in [facility] clinic where GDMT was further uptitrated.  He reports that he quit smoking several weeks ago.  No alcohol use.  He currently works at the [employer]. Mother has 7 stents; father died of MI at 72.   Interval hx:  Reports for the past week he has had increased energy; less day time fatigue. No chest pressure, lightheadedness, no recent episodes of diaphoresis (frequently prior to PCI), reports compliance with medications, no peripheral edema. No tobacco use since admission and following with low salt diet.   Activity level/exercise tolerance:  NYHA IIB Orthopnea:  Sleeps on 2 pillows Paroxysmal noctural dyspnea:  no Chest pain/pressure:  no Orthostatic lightheadedness:  no Palpitations:  no Lower extremity edema:  no Presyncope/syncope:  no Cough:  no  Past Medical History:  Diagnosis Date   Chest pain    HTN (hypertension)    Hyperlipidemia    Lesion of penis    foreskin   OSA (obstructive sleep apnea)    per pt dx osa and  used cpap but after losing wt. from 415 pounds down to 244 pounds no longer needs cpap   Peripheral neuropathy    Phimosis    Scrotal abscess 05/08/2017   Type 2 diabetes mellitus ([place])    per pt no meds for two years pt was changed to levemir unable to afford but pt states is waiting on approval for a program he applied for that will pay for his meds [facility])      Current Outpatient Medications  Medication Sig Dispense Refill   aspirin 81 MG chewable tablet Chew 1 tablet (81 mg total) by mouth daily. 30 tablet 0   atorvastatin (LIPITOR) 80 MG tablet Take 1 tablet (80 mg total) by mouth daily. 30 tablet 0   carvedilol (COREG) 12.5 MG tablet Take 1 tablet (12.5 mg total) by mouth 2 (two) times daily with a meal. 60 tablet 0   clopidogrel (PLAVIX) 75 MG tablet Take 1 tablet (75 mg total) by mouth daily. 30 tablet 0   empagliflozin (JARDIANCE) 10 MG TABS tablet Take 1 tablet (10 mg total) by mouth daily before breakfast. 90 tablet 3   glipiZIDE (GLUCOTROL) 5 MG tablet Take 1 tablet (5 mg total) by mouth daily before breakfast. 90 tablet 2   hydrALAZINE (APRESOLINE) 50 MG tablet Take 50 mg by mouth 2 (two) times daily.     isosorbide mononitrate (IMDUR) 30 MG 24 hr tablet Take 1 tablet (30 mg total) by mouth daily. 30 tablet 0   nitroGLYCERIN (NITROSTAT) 0.4 MG SL tablet Place 1  tablet (0.4 mg total) under the tongue every 5 (five) minutes as needed for chest pain. 90 tablet 3   Insulin Pen Needle 32G X 4 MM MISC Use with Basaglar 100 each 0   No current facility-administered medications for this encounter.    Allergies  Allergen Reactions   Augmentin [Amoxicillin-Pot Clavulanate] Hives and Itching    Has patient had a PCN reaction causing immediate rash, facial/tongue/throat swelling, SOB or lightheadedness with hypotension: Yes Has patient had a PCN reaction causing severe rash involving mucus membranes or skin necrosis: No Has patient had a PCN reaction that required  hospitalization: No Has patient had a PCN reaction occurring within the last 10 years: No If all of the above answers are "NO", then may proceed with Cephalosporin use.   Contrast Media [Iodinated Contrast Media] Hives    Pt broke out with 1 hive on forehead after CT injection-treated with 25mg  Benadryl      Social History   Socioeconomic History   Marital status: Married    Spouse name: Shaun Cummings   Number of children: 2   Years of education: college    Highest education level: Not on file  Occupational History   Occupation: Group Home   Tobacco Use   Smoking status: Former    Years: 2.00    Types: Cigarettes    Quit date: 02/05/2008    Years since quitting: 14.0   Smokeless tobacco: Never  Vaping Use   Vaping Use: Never used  Substance and Sexual Activity   Alcohol use: No   Drug use: Not Currently    Types: Marijuana    Comment: quit 1 month ago.   Sexual activity: Not on file  Other Topics Concern   Not on file  Social History Narrative   Lives with wife and 2 kids, age 6 and 63.    Social Determinants of Health   Financial Resource Strain: Low Risk  (01/14/2022)   Overall Financial Resource Strain (CARDIA)    Difficulty of Paying Living Expenses: Not hard at all  Food Insecurity: Not on file  Transportation Needs: No Transportation Needs (01/14/2022)   PRAPARE - Hydrologist (Medical): No    Lack of Transportation (Non-Medical): No  Physical Activity: Not on file  Stress: Not on file  Social Connections: Not on file  Intimate Partner Violence: Not on file      Family History  Problem Relation Age of Onset   Diabetes Mother    Angina Mother    CAD Mother    Heart disease Mother    Glaucoma Father    Diabetes Father    Heart attack Father    Kidney failure Father    Diabetes Brother    Glaucoma Maternal Grandmother    Diabetes Maternal Grandmother    Heart disease Maternal Grandmother    Glaucoma Maternal Grandfather     Diabetes Maternal Grandfather    Heart disease Maternal Grandfather    Glaucoma Paternal Grandmother    Diabetes Paternal Grandmother    Glaucoma Paternal Grandfather    Diabetes Paternal Grandfather    Asthma Son     PHYSICAL EXAM: Vitals:   02/24/22 1017  BP: 134/78  Pulse: 74  SpO2: 99%   GENERAL: Well nourished, well developed, and in no apparent distress at rest.  HEENT: Negative for arcus senilis or xanthelasma. There is no scleral icterus.  The mucous membranes are pink and moist.   NECK: Supple, No masses. Normal carotid  upstrokes without bruits. No masses or thyromegaly.    CHEST: There are no chest wall deformities. There is no chest wall tenderness. Respirations are unlabored.  Lungs-CTA bilaterally CARDIAC:  JVP: Less than 8 cm cm H2O         Normal S1, S2  Normal rate with regular rhythm. No murmurs, rubs or gallops.  Pulses are 2+ and symmetrical in upper and lower extremities.  No edema.  ABDOMEN: Soft, non-tender, non-distended. There are no masses or hepatomegaly. There are normal bowel sounds.  EXTREMITIES: Warm and well perfused with no cyanosis, clubbing.  LYMPHATIC: No axillary or supraclavicular lymphadenopathy.  NEUROLOGIC: Patient is oriented x3 with no focal or lateralizing neurologic deficits.  PSYCH: Patients affect is appropriate, there is no evidence of anxiety or depression.  SKIN: Warm and dry; no lesions or wounds.   DATA REVIEW  ECG: 01/30/2022: Normal sinus rhythm with LVH as per my interpretation  ECHO: 01/05/2022: LVEF 35 to 40%.  Normal RV function.  CATH: Dist LAD lesion is 50% stenosed.   Prox LAD lesion is 70% stenosed.   Ost RCA to Prox RCA lesion is 75% stenosed.   Prox RCA lesion is 75% stenosed.   Prox RCA to Mid RCA lesion is 95% stenosed.   Mid RCA lesion is 50% stenosed.   1st Mrg lesion is 50% stenosed.   Mid Cx-2 lesion is 70% stenosed.   2nd Mrg lesion is 95% stenosed.   Mid Cx-1 lesion is 50% stenosed. S/P PCI the  following day   ASSESSMENT & PLAN:  Heart failure with reduced ejection fraction Etiology of HF: Ischemic cardiomyopathy NYHA class / AHA Stage: 2B Volume status & Diuretics: Euvolemic to mildly hypervolemic on exam.  Continue Lasix as needed.  Instructed on dosing based on weights and lower extremity edema. Vasodilators:D/C hydralazine, start Entresto 49/51mg  BID. Follow up with pharmacy in 2-3 weeks to closely monitor BP and uptitrate.  Beta-Blocker: Coreg 12.5 mg twice daily MRA: Spironolactone 12.5 mg daily Cardiometabolic: Jardiance 10 mg daily Devices therapies & Valvulopathies: Not currently indicated Advanced therapies: Not indicated  2.  Coronary artery disease Status post PCI in December 2023 to the RCA. Plavix 25 mg daily, aspirin 81, Lipitor 80  3.  Hypertension - DC hydralazine 50 mg twice daily and replace with Entresto 49/51 mg twice daily.  I instructed the patient to very closely follow his blood pressure at home and call us with results.  If he becomes hypertensive we will plan to increase Entresto to 97/103 mg twice daily.  4.  CKD - Serum creatinine 1.18 in April 2023, peaked at 3.3 and most recently down to 1.5 at outside labs according to patient.  Currently on Jardiance.  Will start ARNI today and carefully monitor labs.  Plan for pharmacy follow-up to uptitrate antihypertensives.  5.  Type 2 diabetes A1c 9.9.    6. Penetrating penetrating aortic ulcer -Discussed importance of blood pressure control.  Increase beta-blocker dose as tolerated.  Jaquann Guarisco Advanced Heart Failure Mechanical Circulatory Support

## 2022-02-24 NOTE — Patient Instructions (Signed)
START Entresto 49/51mg  Twice daily  STOP Hyrdalazine.  Please follow up with our heart failure pharmacist in 2-3 weeks  [place].  Your physician recommends that you schedule a follow-up appointment in: 6 weeks   If you have any questions or concerns before your next appointment please send [place] a message through [place] or call our office at [phone].    TO LEAVE A MESSAGE FOR THE NURSE SELECT OPTION 2, PLEASE LEAVE A MESSAGE INCLUDING: YOUR NAME DATE OF BIRTH CALL BACK NUMBER REASON FOR CALL**this is important as we prioritize the call backs  YOU WILL RECEIVE A CALL BACK THE SAME DAY AS LONG AS YOU CALL BEFORE 4:00 PM  At the [facility], you and your health needs are our priority. As part of our continuing mission to provide you with exceptional heart care, we have created designated Provider Care Teams. These Care Teams include your primary Cardiologist (physician) and Advanced Practice Providers (APPs- Physician Assistants and Nurse Practitioners) who all work together to provide you with the care you need, when you need it.   You may see any of the following providers on your designated Care Team at your next follow up: Dr Glori Bickers Dr Loralie Champagne Dr. Roxana Hires, NP Lyda Jester, [place] [facility] [place], [place] Forestine Na, NP Audry Riles, PharmD   Please be sure to bring in all your medications bottles to every appointment.    Thank you for choosing [facility]

## 2022-02-25 ENCOUNTER — Ambulatory Visit: Payer: 59 | Admitting: Vascular Surgery

## 2022-02-25 ENCOUNTER — Other Ambulatory Visit: Payer: Self-pay

## 2022-02-25 ENCOUNTER — Encounter: Payer: Self-pay | Admitting: Vascular Surgery

## 2022-02-25 VITALS — BP 143/87 | HR 76 | Temp 98.4°F | Resp 20 | Ht 71.0 in | Wt 230.0 lb

## 2022-02-25 DIAGNOSIS — I719 Aortic aneurysm of unspecified site, without rupture: Secondary | ICD-10-CM | POA: Diagnosis not present

## 2022-02-25 DIAGNOSIS — M7989 Other specified soft tissue disorders: Secondary | ICD-10-CM

## 2022-02-25 NOTE — Progress Notes (Signed)
Patient ID: Shaun Cummings, male   DOB: 02-May-1975, 47 y.o.   MRN: [id number]  Reason for Consult: New Patient (Initial Visit)   Referred by Iona Beard, MD  Subjective:     HPI:  Shaun Cummings is a 47 y.o. 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  Ultimately his pain was thought to be secondary to coronary artery disease and he underwent PCI.  He does have elevated creatinine and follow-up CT scan was not performed.  He states that all of his symptoms have improved since hospitalization no further abdominal pain. Patient currently weighs 230 pounds has been as high as 185 pounds in the past.  Past Medical History:  Diagnosis Date   Chest pain    HTN (hypertension)    Hyperlipidemia    Lesion of penis    foreskin   OSA (obstructive sleep apnea)    per pt dx osa and used cpap but after losing wt. from 415 pounds down to 244 pounds no longer needs cpap   Peripheral neuropathy    Phimosis    Scrotal abscess 05/08/2017   Type 2 diabetes mellitus ([place])    per pt no meds for two years pt was changed to levemir unable to afford but pt states is waiting on approval for a program he applied for that will pay for his meds ([place])     Family History  Problem Relation Age of Onset   Diabetes Mother    Angina Mother    CAD Mother    Heart disease Mother    Glaucoma Father    Diabetes Father    Heart attack Father    Kidney failure Father    Diabetes Brother    Glaucoma Maternal Grandmother    Diabetes Maternal Grandmother    Heart disease Maternal Grandmother    Glaucoma Maternal Grandfather    Diabetes Maternal Grandfather    Heart disease Maternal Grandfather    Glaucoma Paternal Grandmother    Diabetes Paternal Grandmother    Glaucoma Paternal Grandfather    Diabetes Paternal Grandfather    Asthma Son    Past Surgical History:  Procedure Laterality Date   CIRCUMCISION N/A 02/10/2016    Procedure: CIRCUMCISION ADULT;  Surgeon: Cleon Gustin, MD;  Location: [place];  Service: Urology;  Laterality: N/A;   CORONARY STENT INTERVENTION N/A 01/14/2022   Procedure: CORONARY STENT INTERVENTION;  Surgeon: Sherren Mocha, MD;  Location: [place] [facility];  Service: Cardiovascular;  Laterality: N/A;   INCISION AND DRAINAGE ABSCESS Right 05/12/2017   Procedure: INCISION AND DRAINAGE RIGHT INGUINAL ABSCESS;  Surgeon: Erroll Luna, MD;  Location: [place];  Service: General;  Laterality: Right;   INCISION AND DRAINAGE ABSCESS N/A 03/24/2018   Procedure: INCISION AND DRAINAGE POSTERIOR NECK ABSCESS;  Surgeon: Excell Seltzer, MD;  Location: [place];  Service: General;  Laterality: N/A;   INCISION AND DRAINAGE ABSCESS Right 06/19/2020   Procedure: INCISION AND DRAINAGE ABSCESS, RIGHT GROIN;  Surgeon: Johnathan Hausen, MD;  Location: [facility];  Service: General;  Laterality: Right;   INCISION AND DRAINAGE PERIRECTAL ABSCESS Right 05/09/2017   Procedure: IRRIGATION AND DEBRIDEMENT PERINEAL ABSCESS;  Surgeon: Donnie Mesa, MD;  Location: [place];  Service: General;  Laterality: Right;   LEFT HEART CATH AND CORONARY ANGIOGRAPHY N/A 01/06/2022   Procedure: LEFT HEART CATH AND CORONARY ANGIOGRAPHY;  Surgeon: Troy Sine, MD;  Location: [place] [facility];  Service: Cardiovascular;  Laterality: N/A;    Short Social History:  Social History   Tobacco Use   Smoking status: Former    Years: 2.00    Types: Cigarettes    Quit date: 02/05/2008    Years since quitting: 14.0   Smokeless tobacco: Never  Substance Use Topics   Alcohol use: No    Allergies  Allergen Reactions   Augmentin [Amoxicillin-Pot Clavulanate] Hives and Itching    Has patient had a PCN reaction causing immediate rash, facial/tongue/throat swelling, SOB or lightheadedness with hypotension: Yes Has patient had a PCN reaction causing severe rash involving mucus membranes or skin necrosis: No Has  patient had a PCN reaction that required hospitalization: No Has patient had a PCN reaction occurring within the last 10 years: No If all of the above answers are "NO", then may proceed with Cephalosporin use.   Contrast Media [Iodinated Contrast Media] Hives    Pt broke out with 1 hive on forehead after CT injection-treated with 25mg  Benadryl    Current Outpatient Medications  Medication Sig Dispense Refill   aspirin 81 MG chewable tablet Chew 1 tablet (81 mg total) by mouth daily. 30 tablet 0   atorvastatin (LIPITOR) 80 MG tablet Take 1 tablet (80 mg total) by mouth daily. 30 tablet 0   carvedilol (COREG) 12.5 MG tablet Take 1 tablet (12.5 mg total) by mouth 2 (two) times daily with a meal. 60 tablet 0   clopidogrel (PLAVIX) 75 MG tablet Take 1 tablet (75 mg total) by mouth daily. 30 tablet 0   empagliflozin (JARDIANCE) 10 MG TABS tablet Take 1 tablet (10 mg total) by mouth daily before breakfast. 90 tablet 3   glipiZIDE (GLUCOTROL) 5 MG tablet Take 1 tablet (5 mg total) by mouth daily before breakfast. 90 tablet 2   isosorbide mononitrate (IMDUR) 30 MG 24 hr tablet Take 1 tablet (30 mg total) by mouth daily. 30 tablet 0   nitroGLYCERIN (NITROSTAT) 0.4 MG SL tablet Place 1 tablet (0.4 mg total) under the tongue every 5 (five) minutes as needed for chest pain. 90 tablet 3   olmesartan (BENICAR) 5 MG tablet Take 5 mg by mouth in the morning and at bedtime.     sacubitril-valsartan (ENTRESTO) 49-51 MG Take 1 tablet by mouth 2 (two) times daily. (Patient not taking: Reported on 02/25/2022) 60 tablet 11   No current facility-administered medications for this visit.    Review of Systems  HENT: HENT negative.  Eyes: Eyes negative.  Respiratory: Respiratory negative.  Cardiovascular: Cardiovascular negative.  GI: Gastrointestinal negative.  Musculoskeletal: Musculoskeletal negative.  Skin: Skin negative.  Neurological: Neurological negative. Hematologic: Hematologic/lymphatic negative.   Psychiatric: Psychiatric negative.        Objective:  Objective   Vitals:   02/25/22 1102  BP: (!) 143/87  Pulse: 76  Resp: 20  Temp: 98.4 F (36.9 C)  SpO2: 97%  Weight: 230 lb (104.3 kg)  Height: 5\' 11"  (1.803 m)   Body mass index is 32.08 kg/m.  Physical Exam HENT:     Head: Normocephalic.     Nose: Nose normal.  Eyes:     Pupils: Pupils are equal, round, and reactive to light.  Cardiovascular:     Rate and Rhythm: Normal rate.  Pulmonary:     Effort: Pulmonary effort is normal.  Abdominal:     General: Abdomen is flat.     Palpations: Abdomen is soft. There is no mass.  Musculoskeletal:  General: Normal range of motion.     Cervical back: Normal range of motion.     Right lower leg: No edema.     Left lower leg: No edema.  Skin:    General: Skin is warm.     Capillary Refill: Capillary refill takes less than 2 seconds.  Neurological:     General: No focal deficit present.     Mental Status: He is alert.  Psychiatric:        Mood and Affect: Mood normal.        Behavior: Behavior normal.        Thought Content: Thought content normal.        Judgment: Judgment normal.     Data: We reviewed his CT scan from the hospital, there was no new imaging today.     Assessment/Plan:    47 year old male with history of aortic penetrating ulceration.  This has been followed back as far as 2019 he was recently admitted did not appear to be symptomatic.  From the standpoint I will have him follow-up in 6 months with aortoiliac duplex particularly given that he has elevated creatinine which is a longstanding issue.  We have discussed the signs and symptoms of symptomatic aortic disease and he demonstrates good understanding.  Thankfully he has controlled his blood glucose since hospitalization.    Waynetta Sandy MD [facility]

## 2022-02-26 ENCOUNTER — Other Ambulatory Visit (HOSPITAL_COMMUNITY): Payer: Self-pay

## 2022-02-27 ENCOUNTER — Other Ambulatory Visit: Payer: Self-pay

## 2022-02-27 ENCOUNTER — Telehealth (HOSPITAL_COMMUNITY): Payer: Self-pay

## 2022-02-27 ENCOUNTER — Encounter (HOSPITAL_COMMUNITY): Payer: Self-pay

## 2022-02-27 NOTE — Telephone Encounter (Signed)
Left message for Shaun Cummings to say that she can return to work as of Monday

## 2022-03-05 ENCOUNTER — Ambulatory Visit (INDEPENDENT_AMBULATORY_CARE_PROVIDER_SITE_OTHER): Payer: 59 | Admitting: Internal Medicine

## 2022-03-05 ENCOUNTER — Other Ambulatory Visit (HOSPITAL_COMMUNITY): Payer: Self-pay

## 2022-03-05 ENCOUNTER — Encounter: Payer: Self-pay | Admitting: Internal Medicine

## 2022-03-05 VITALS — BP 123/70 | HR 75 | Temp 98.2°F | Ht 70.5 in | Wt 233.8 lb

## 2022-03-05 DIAGNOSIS — I10 Essential (primary) hypertension: Secondary | ICD-10-CM

## 2022-03-05 DIAGNOSIS — Z87891 Personal history of nicotine dependence: Secondary | ICD-10-CM

## 2022-03-05 DIAGNOSIS — E1139 Type 2 diabetes mellitus with other diabetic ophthalmic complication: Secondary | ICD-10-CM

## 2022-03-05 DIAGNOSIS — I719 Aortic aneurysm of unspecified site, without rupture: Secondary | ICD-10-CM | POA: Diagnosis not present

## 2022-03-05 DIAGNOSIS — E785 Hyperlipidemia, unspecified: Secondary | ICD-10-CM

## 2022-03-05 DIAGNOSIS — Z7984 Long term (current) use of oral hypoglycemic drugs: Secondary | ICD-10-CM

## 2022-03-05 DIAGNOSIS — E1169 Type 2 diabetes mellitus with other specified complication: Secondary | ICD-10-CM

## 2022-03-05 DIAGNOSIS — Z0000 Encounter for general adult medical examination without abnormal findings: Secondary | ICD-10-CM

## 2022-03-05 DIAGNOSIS — I509 Heart failure, unspecified: Secondary | ICD-10-CM | POA: Diagnosis not present

## 2022-03-05 DIAGNOSIS — I110 Hypertensive heart disease with heart failure: Secondary | ICD-10-CM

## 2022-03-05 MED ORDER — BLOOD GLUCOSE METER KIT: PACK | 0 refills | Status: DC

## 2022-03-05 NOTE — Progress Notes (Signed)
CC: follow up  HPI:  [name] is a 47 y.o. with medical history of HTN, DMII, HLD, CAD, hx of NSTEMI presenting to [facility] for follow up. Pt admitted for hypertensive emergency in 12/2021. She also underwent PCI at that time. Pt states he is doing well after his hospitalization.   Please see problem-based list for further details, assessments, and plans.  Past Medical History:  Diagnosis Date   Abdominal pain 01/07/2022   Angina pectoris ([place]) 12/17/2021   Blurred vision 02/04/2021   Chest pain    Elevated troponin 01/04/2022   HTN (hypertension)    Hyperlipidemia    Hypertensive emergency 01/04/2022   Infected epithelial inclusion cyst 03/14/2018   Leg edema, right 12/31/2021   Lesion of penis    foreskin   OSA (obstructive sleep apnea)    per pt dx osa and used cpap but after losing wt. from 415 pounds down to 244 pounds no longer needs cpap   Peripheral neuropathy    Peripheral neuropathy 05/21/2017   Phimosis    Scrotal abscess 05/08/2017   Type 2 diabetes mellitus ([place])    per pt no meds for two years pt was changed to levemir unable to afford but pt states is waiting on approval for a program he applied for that will pay for his meds [facility])      Current Outpatient Medications (Endocrine & Metabolic):    empagliflozin (JARDIANCE) 10 MG TABS tablet, Take 1 tablet (10 mg total) by mouth daily before breakfast.   glipiZIDE (GLUCOTROL) 5 MG tablet, Take 1 tablet (5 mg total) by mouth daily before breakfast.  Current Outpatient Medications (Cardiovascular):    ezetimibe (ZETIA) 10 MG tablet, Take 1 tablet (10 mg total) by mouth daily.   spironolactone (ALDACTONE) 25 MG tablet, Take 0.5 tablets (12.5 mg total) by mouth daily.   atorvastatin (LIPITOR) 80 MG tablet, Take 1 tablet (80 mg total) by mouth daily.   carvedilol (COREG) 12.5 MG tablet, Take 1 tablet (12.5 mg total) by mouth 2 (two) times daily with a meal.   isosorbide mononitrate (IMDUR) 30 MG  24 hr tablet, Take 1 tablet (30 mg total) by mouth daily.   nitroGLYCERIN (NITROSTAT) 0.4 MG SL tablet, Place 1 tablet (0.4 mg total) under the tongue every 5 (five) minutes as needed for chest pain.   olmesartan (BENICAR) 5 MG tablet, Take 5 mg by mouth in the morning and at bedtime.   sacubitril-valsartan (ENTRESTO) 49-51 MG, Take 1 tablet by mouth 2 (two) times daily. (Patient not taking: Reported on 02/25/2022)   Current Outpatient Medications (Analgesics):    aspirin 81 MG chewable tablet, Chew 1 tablet (81 mg total) by mouth daily.  Current Outpatient Medications (Hematological):    clopidogrel (PLAVIX) 75 MG tablet, Take 1 tablet (75 mg total) by mouth daily.  Current Outpatient Medications (Other):    blood glucose meter kit and supplies, Dispense based on patient and insurance preference. Use up to four times daily as directed. (FOR ICD-10 E10.9, E11.9).   Review of Systems:  Review of system negative unless stated in the problem list or HPI.    Physical Exam:  Vitals:   03/05/22 1318 03/05/22 1347  BP: (!) 144/81 123/70  Pulse: 73 75  Temp: 98.2 F (36.8 C)   TempSrc: Oral   SpO2: 100%   Weight: 233 lb 12.8 oz (106.1 kg)   Height: 5' 10.5" (1.791 m)     Physical Exam General: NAD HENT: NCAT Lungs:  CTAB, no wheeze, rhonchi or rales.  Cardiovascular: Normal heart sounds, no r/m/g, 2+ pulses in all extremities. No LE edema Abdomen: No TTP, normal bowel sounds MSK: No asymmetry or muscle atrophy.  Skin: no lesions noted on exposed skin Neuro: Alert and oriented x4. CN grossly intact Psych: Normal mood and normal affect   Assessment & Plan:   Penetrating atherosclerotic ulcer of aorta (HCC) Abdominal aortic ulcer noted on imaging during his hospitalization.  Vascular surgery following and plan on repeating imaging in 6 months.  Hyperlipidemia associated with type 2 diabetes mellitus ([place]) Patient has history of hyperlipidemia with last lipid panel around a year  ago showing elevated cholesterol and LDL.  Repeat lipid panel this visit shows improvement in his cholesterol and LDL but he is still above goal.  We will start Zetia 10 mg for him and continue the Lipitor 80 mg.  Spoke with the wife and she states patient has undergone significant dietary changes and that should help as well.  She initially wanted to avoid further medications but I advised her that we need to get his cholesterol under control given his significant history. We can remove this medication once his cholesterol is under control long as his cholesterol remains under control.  Diabetes ([place]) Last A1c is 9.9 in 12/2021. On Glipizide 5 mg qd, and Jardiance 10 mg qd. Was on insulin a few weeks but resulted in hypoglycemia so that was discontinued.  Offered patient GLP 1 agonist but patient states he will not take those given his past experience with that medication.  He tried Ozempic that caused him severe abdominal pain.  Patient also reports issues with metformin.  Patient and his wife, who I spoke with later on the phone, endorse significant dietary changes.  Patient reports glucose around 140 in the morning which suggest that his glycemic control is improving.  Will continue current regimen and make changes if A1c remains elevated.  Given glipizide can result in hypoglycemia, I asked patient on symptoms of hypoglycemia and have provided him with a plan for glucometer that he can use.  I advised him to check his sugars daily and if he has symptoms of hypoglycemia or thinks his sugar may be low.  Hypertension Patient is blood pressure is doing well.  His initial blood pressure was elevated but the second reading was within normal limits.  Patient is currently on Coreg 12.5 mg twice daily and his heart failure medications which include Entresto, Jardiance, spironolactone.  Patient is also on olmesartan 5 mg twice daily.  Patient's wife states his cardiologist put him on this medication and wants him  to take it.  I advised patient on contacting cardiology office to ensure he is taking the olmesartan given he is taking Entresto.  Patient does have an appointment with their office on 03/18/22 and advised them to take all of his medications there show he is on the proper regimen.  CHF (congestive heart failure) [facility]) Patient presented to the hospital with hypertensive emergency found to have NSTEMI and new heart failure.  He was started on GDMT and has followed up with heart failure clinic.  His GDMT includes Jardiance, Entresto, Coreg, and Spironolactone.  Patient is euvolemic today and states he is feeling well without any acute complaints.  He is able to lay flat and uses only 1 pillow at nighttime.  Patient himself appears unfamiliar with his medication and states his wife gives him the medications.  He did not know he was on [facility]  but I verified his medication list with his wife and he is taking the above medications.  Spironolactone was not on his medication list and I believe this may have been given to him at discharge but the wife verified that patient is taking this medication.  I have sent another refill of this medication to his pharmacy.  Advised patient to follow-up with heart failure clinic as they will uptitrate his medications.  Patient will benefit from a repeat echo given his heart failure was secondary to an NSTEMI and echo was obtained acutely.  Healthcare maintenance Ophthalmology referral sent.   See Encounters Tab for problem based charting.  Patient discussed with Dr. Garald Balding, MD Tillie Rung. [facility] Internal Medicine Residency, PGY-2

## 2022-03-05 NOTE — Patient Instructions (Addendum)
Shaun Cummings, it was a pleasure seeing you today! You endorsed feeling well today. Below are some of the things we talked about this visit. We look forward to seeing you in the follow up appointment!  Today we discussed: Continue your current medications. I will verify some of your medications to ensure you are taking the right ones.  I will check your cholesterol to ensure it is well controlled.  I have placed an eye referral for you.   I have ordered the following labs today:  Lab Orders         Lipid Profile       Referrals ordered today:   Referral Orders         Ambulatory referral to Ophthalmology       I have ordered the following medication/changed the following medications:   Stop the following medications: There are no discontinued medications.   Start the following medications: Meds ordered this encounter  Medications   blood glucose meter kit and supplies    Sig: Dispense based on patient and insurance preference. Use up to four times daily as directed. (FOR ICD-10 E10.9, E11.9).    Dispense:  1 each    Refill:  0    Order Specific Question:   Number of strips    Answer:   120    Order Specific Question:   Number of lancets    Answer:   120     Follow-up: 1 month follow up   Please make sure to arrive 15 minutes prior to your next appointment. If you arrive late, you may be asked to reschedule.   We look forward to seeing you next time. Please call our clinic at [phone] if you have any questions or concerns. The best time to call is Monday-Friday from 9am-4pm, but there is someone available 24/7. If after hours or the weekend, call the main hospital number and ask for the Internal Medicine Resident On-Call. If you need medication refills, please notify your pharmacy one week in advance and they will send [place] a request.  Thank you for letting us take part in your care. Wishing you the best!  Thank you, Idamae Schuller, MD

## 2022-03-06 ENCOUNTER — Encounter: Payer: Self-pay | Admitting: Internal Medicine

## 2022-03-06 DIAGNOSIS — I509 Heart failure, unspecified: Secondary | ICD-10-CM | POA: Insufficient documentation

## 2022-03-06 LAB — LIPID PANEL
Chol/HDL Ratio: 5.1 ratio — ABNORMAL HIGH (ref 0.0–5.0)
Cholesterol, Total: 138 mg/dL (ref 100–199)
HDL: 27 mg/dL — ABNORMAL LOW (ref >39)
LDL Chol Calc (NIH): 84 mg/dL (ref 0–99)
Triglycerides: 150 mg/dL — ABNORMAL HIGH (ref 0–149)
VLDL Cholesterol Cal: 27 mg/dL (ref 5–40)

## 2022-03-06 MED ORDER — EZETIMIBE 10 MG PO TABS: 10.0000 mg | ORAL_TABLET | Freq: Every day | ORAL | 11 refills | Status: DC

## 2022-03-06 MED ORDER — SPIRONOLACTONE 25 MG PO TABS: 12.5000 mg | ORAL_TABLET | Freq: Every day | ORAL | 0 refills | Status: DC

## 2022-03-06 NOTE — Assessment & Plan Note (Signed)
Patient is blood pressure is doing well.  His initial blood pressure was elevated but the second reading was within normal limits.  Patient is currently on Coreg 12.5 mg twice daily and his heart failure medications which include Entresto, Jardiance, spironolactone.  Patient is also on olmesartan 5 mg twice daily.  Patient's wife states his cardiologist put him on this medication and wants him to take it.  I advised patient on contacting cardiology office to ensure he is taking the olmesartan given he is taking Entresto.  Patient does have an appointment with their office on 03/18/22 and advised them to take all of his medications there show he is on the proper regimen.

## 2022-03-06 NOTE — Assessment & Plan Note (Signed)
Patient presented to the hospital with hypertensive emergency found to have NSTEMI and new heart failure.  He was started on GDMT and has followed up with heart failure clinic.  His GDMT includes Jardiance, Entresto, Coreg, and Spironolactone.  Patient is euvolemic today and states he is feeling well without any acute complaints.  He is able to lay flat and uses only 1 pillow at nighttime.  Patient himself appears unfamiliar with his medication and states his wife gives him the medications.  He did not know he was on Entresto but I verified his medication list with his wife and he is taking the above medications.  Spironolactone was not on his medication list and I believe this may have been given to him at discharge but the wife verified that patient is taking this medication.  I have sent another refill of this medication to his pharmacy.  Advised patient to follow-up with heart failure clinic as they will uptitrate his medications.  Patient will benefit from a repeat echo given his heart failure was secondary to an NSTEMI and echo was obtained acutely.

## 2022-03-06 NOTE — Assessment & Plan Note (Signed)
Ophthalmology referral sent.

## 2022-03-06 NOTE — Assessment & Plan Note (Signed)
Abdominal aortic ulcer noted on imaging during his hospitalization.  Vascular surgery following and plan on repeating imaging in 6 months.

## 2022-03-06 NOTE — Assessment & Plan Note (Signed)
Last A1c is 9.9 in 12/2021. On Glipizide 5 mg qd, and Jardiance 10 mg qd. Was on insulin a few weeks but resulted in hypoglycemia so that was discontinued.  Offered patient GLP 1 agonist but patient states he will not take those given his past experience with that medication.  He tried Ozempic that caused him severe abdominal pain.  Patient also reports issues with metformin.  Patient and his wife, who I spoke with later on the phone, endorse significant dietary changes.  Patient reports glucose around 140 in the morning which suggest that his glycemic control is improving.  Will continue current regimen and make changes if A1c remains elevated.  Given glipizide can result in hypoglycemia, I asked patient on symptoms of hypoglycemia and have provided him with a plan for glucometer that he can use.  I advised him to check his sugars daily and if he has symptoms of hypoglycemia or thinks his sugar may be low.

## 2022-03-06 NOTE — Assessment & Plan Note (Signed)
Patient has history of hyperlipidemia with last lipid panel around a year ago showing elevated cholesterol and LDL.  Repeat lipid panel this visit shows improvement in his cholesterol and LDL but he is still above goal.  We will start Zetia 10 mg for him and continue the Lipitor 80 mg.  Spoke with the wife and she states patient has undergone significant dietary changes and that should help as well.  She initially wanted to avoid further medications but I advised her that we need to get his cholesterol under control given his significant history. We can remove this medication once his cholesterol is under control long as his cholesterol remains under control.

## 2022-03-09 ENCOUNTER — Telehealth (HOSPITAL_COMMUNITY): Payer: Self-pay | Admitting: *Deleted

## 2022-03-09 NOTE — Progress Notes (Signed)
Internal Medicine Clinic Attending  Case discussed with Dr. Khan  At the time of the visit.  We reviewed the resident's history and exam and pertinent patient test results.  I agree with the assessment, diagnosis, and plan of care documented in the resident's note.  

## 2022-03-09 NOTE — Telephone Encounter (Signed)
Left detailed vm for pts wife.

## 2022-03-09 NOTE — Telephone Encounter (Signed)
Pts wife left vm stating pcp told her to ask the cardiologist if pt is to continue Benicar with Entresto.   Routed to Dr.Sabharwal

## 2022-03-10 ENCOUNTER — Other Ambulatory Visit (HOSPITAL_COMMUNITY): Payer: Self-pay

## 2022-03-10 DIAGNOSIS — N1831 Chronic kidney disease, stage 3a: Secondary | ICD-10-CM | POA: Diagnosis not present

## 2022-03-10 MED ORDER — ENTRESTO 49-51 MG PO TABS: 1.0000 | ORAL_TABLET | Freq: Two times a day (BID) | ORAL | 11 refills | Status: DC

## 2022-03-15 ENCOUNTER — Other Ambulatory Visit (HOSPITAL_COMMUNITY): Payer: Self-pay | Admitting: Physician Assistant

## 2022-03-16 ENCOUNTER — Other Ambulatory Visit (HOSPITAL_COMMUNITY): Payer: Self-pay | Admitting: Cardiology

## 2022-03-16 ENCOUNTER — Other Ambulatory Visit (HOSPITAL_COMMUNITY): Payer: Self-pay

## 2022-03-16 MED ORDER — CLOPIDOGREL BISULFATE 75 MG PO TABS: 75.0000 mg | ORAL_TABLET | Freq: Every day | ORAL | 3 refills | Status: DC

## 2022-03-16 MED ORDER — ISOSORBIDE MONONITRATE ER 30 MG PO TB24: 30.0000 mg | ORAL_TABLET | Freq: Every day | ORAL | 0 refills | Status: DC

## 2022-03-16 MED ORDER — CARVEDILOL 12.5 MG PO TABS: 12.5000 mg | ORAL_TABLET | Freq: Two times a day (BID) | ORAL | 0 refills | Status: DC

## 2022-03-16 MED ORDER — ATORVASTATIN CALCIUM 80 MG PO TABS: 80.0000 mg | ORAL_TABLET | Freq: Every day | ORAL | 0 refills | Status: DC

## 2022-03-18 ENCOUNTER — Ambulatory Visit (HOSPITAL_COMMUNITY)
Admission: RE | Admit: 2022-03-18 | Discharge: 2022-03-18 | Disposition: A | Discharge status: Home / Self Care | Payer: 59 | Source: Ambulatory Visit | Attending: Internal Medicine | Admitting: Internal Medicine

## 2022-03-18 ENCOUNTER — Telehealth (HOSPITAL_COMMUNITY): Payer: Self-pay | Admitting: Pharmacist

## 2022-03-18 VITALS — BP 122/74 | HR 73 | Wt 231.4 lb

## 2022-03-18 DIAGNOSIS — N189 Chronic kidney disease, unspecified: Secondary | ICD-10-CM | POA: Diagnosis not present

## 2022-03-18 DIAGNOSIS — E1122 Type 2 diabetes mellitus with diabetic chronic kidney disease: Secondary | ICD-10-CM | POA: Insufficient documentation

## 2022-03-18 DIAGNOSIS — I5022 Chronic systolic (congestive) heart failure: Secondary | ICD-10-CM | POA: Insufficient documentation

## 2022-03-18 DIAGNOSIS — Z7982 Long term (current) use of aspirin: Secondary | ICD-10-CM | POA: Diagnosis not present

## 2022-03-18 DIAGNOSIS — I252 Old myocardial infarction: Secondary | ICD-10-CM | POA: Diagnosis not present

## 2022-03-18 DIAGNOSIS — Z79899 Other long term (current) drug therapy: Secondary | ICD-10-CM | POA: Insufficient documentation

## 2022-03-18 DIAGNOSIS — Z7984 Long term (current) use of oral hypoglycemic drugs: Secondary | ICD-10-CM | POA: Insufficient documentation

## 2022-03-18 DIAGNOSIS — I130 Hypertensive heart and chronic kidney disease with heart failure and stage 1 through stage 4 chronic kidney disease, or unspecified chronic kidney disease: Secondary | ICD-10-CM | POA: Insufficient documentation

## 2022-03-18 DIAGNOSIS — E785 Hyperlipidemia, unspecified: Secondary | ICD-10-CM | POA: Insufficient documentation

## 2022-03-18 LAB — BASIC METABOLIC PANEL
Anion gap: 7 (ref 5–15)
BUN: 35 mg/dL — ABNORMAL HIGH (ref 6–20)
CO2: 21 mmol/L — ABNORMAL LOW (ref 22–32)
Calcium: 8.7 mg/dL — ABNORMAL LOW (ref 8.9–10.3)
Chloride: 108 mmol/L (ref 98–111)
Creatinine, Ser: 1.75 mg/dL — ABNORMAL HIGH (ref 0.61–1.24)
GFR, Estimated: 49 mL/min — ABNORMAL LOW (ref >60)
Glucose, Bld: 125 mg/dL — ABNORMAL HIGH (ref 70–99)
Potassium: 4.4 mmol/L (ref 3.5–5.1)
Sodium: 136 mmol/L (ref 135–145)

## 2022-03-18 MED ORDER — ENTRESTO 49-51 MG PO TABS: 1.0000 | ORAL_TABLET | Freq: Two times a day (BID) | ORAL | 11 refills | Status: DC

## 2022-03-18 MED ORDER — EMPAGLIFLOZIN 10 MG PO TABS: 10.0000 mg | ORAL_TABLET | Freq: Every day | ORAL | 3 refills | Status: DC

## 2022-03-18 MED ORDER — CARVEDILOL 25 MG PO TABS: 25.0000 mg | ORAL_TABLET | Freq: Two times a day (BID) | ORAL | 0 refills | Status: DC

## 2022-03-18 NOTE — Progress Notes (Signed)
Advanced Heart Failure Clinic Note   Referring Physician: Iona Beard, MD  Primary Care: Iona Beard, MD Primary Cardiologist: Establishing Care; PCI by Dr. Burt Knack HF Cardiologist: Hebert Soho, MD  HPI:  Shaun Cummings is a 47 y.o. male with hypertension, hyperlipidemia, tobacco use, family history of premature CAD that presented to [facility] on January 04, 2022 with uncontrolled hypertension and concern for acute aortic dissection. During that admission high-sensitivity troponin was up to 7000 he had a CTA chest abdomen pelvis with mural thrombus involving the abdominal aorta and a penetrating ulcer in the infrarenal abdominal aorta. Vascular surgery was consulted and he was managed conservatively. He had an echocardiogram during the admission demonstrating LVEF of 35 to 40%, left heart cath with severe three-vessel disease. Due to concern for multiple core morbidities he underwent high risk PCI with PCI to the mid RCA by Dr. Burt Knack. Since that time he has been seen in [facility] clinic where GDMT was further uptitrated. He reported smoking cessation after his MI. He currently works at the [employer]. Mother has 7 stents; father died of MI at 24.    At last CHF visit on 02/24/22, he reported increased energy and less day time fatigue. Creatinine had peaked at 3.3, but outside labs had shown it was down to 1.5, so Entresto replaced olmesartan and hydralazine was discontinued.  Today he returns to HF clinic for pharmacist medication titration. Reports overall feeling pretty good. Denies dizziness, lightheadedness, and fatigue. Denies chest pain and palpitations. His chest felt "weird" once and he took one nitroglycerin tablet with resolution. Denies SOB and able to complete all ADLs. He walks at work, but does not do any regular exercise. Denies LEE, orthopnea, and PND. He does not require diuretics. Appetite is fair. Adheres to low-salt diet. Has cut out [employer]. BP at home is  generally 120-140s/80-100 mmHg with his wrist cuff. Of note, he is not taking spironolactone (unclear why).   HF Medications: Carvedilol 12.5 mg BID Entresto 49/51 mg BID Jardiance 10 mg daily Isosorbide mononitrate 30 mg daily  Has the patient been experiencing any side effects to the medications prescribed?  No  Does the patient have any problems obtaining medications due to transportation or finances?   Yes. He recently started at work within the last week. His paychecks are monthly and his first paycheck is not until the end of March. He has Pharmacist, community, but can not afford the copay at this time. Copay cards for Vania Rea and Delene Loll were provided. Samples for Entresto were also provided. He reports his copays will be manageable after his first paycheck.  Understanding of regimen: excellent Understanding of indications: excellent Potential of compliance: excellent Patient understands to avoid NSAIDs. Patient understands to avoid decongestants.    Pertinent Lab Values (01/30/22): Serum creatinine 1.65, BUN 22, Potassium 4.2, Sodium 138, BNP 433.9 BMP today pending  Vital Signs: Weight: 231.4 lbs (last clinic weight: 230 lbs) Blood pressure: 122/74 mmHg  Heart rate: 73 bpm   Assessment/Plan: Heart failure with reduced ejection fraction Etiology of HF: Ischemic cardiomyopathy NYHA class / AHA Stage: IIB Volume status & Diuretics: Euvolemic on exam. Continue Lasix as needed. He is comfortable dosing based on weights and lower extremity edema. Vasodilators: Continue Entresto 49/'51mg'$  BID. Stop isosorbide mononitrate given no chest pain.  Beta-Blocker: Increase carvedilol to 25 mg twice daily MRA: Not taking spironolactone. Will consider restarting spironolactone pending creatinine and potassium. Cardiometabolic: Continue Jardiance 10 mg daily Devices therapies & Valvulopathies: Not currently  indicated Advanced therapies: Not indicated   2.  Coronary artery disease -  Status post PCI in December 2023 to the RCA. Continue clopidogrel 75 mg daily, aspirin 81, and atorvastatin 80 mg daily.   3.  Hypertension - See above.   4.  CKD - Serum creatinine 1.18 in April 2023, peaked at 3.3 and most recently down to 1.5 at outside labs according to patient. Currently on [place]. Check BMP today.   5.  Type 2 diabetes - A1c 9.9. Management per PCP. Continue Jardiance as above.   6. Penetrating penetrating aortic ulcer - Increase beta-blocker dose for HR and BP control.  BMP today. Return to clinic 04/08/22 with Dr. Cleda Daub, PharmD, BCPS, [facility], CPP Heart Failure Clinic Pharmacist [phone]

## 2022-03-18 NOTE — Patient Instructions (Signed)
It was a pleasure seeing you today!  MEDICATIONS: -We are changing your medications today -Increase carvedilol to 25 mg twice daily. You can take two tablets of the 12.5 mg dose twice daily until you run out. -Stop isosorbide. You can restart this if you have chest pain. -Activate copay cards -Call if you have questions about your medications.  LABS: -We will call you if your labs need attention.  NEXT APPOINTMENT: Return to clinic 04/08/22 with Dr. Daniel Nones.  In general, to take care of your heart failure: -Limit your fluid intake to 2 Liters (half-gallon) per day.   -Limit your salt intake to ideally 2-3 grams (2000-3000 mg) per day. -Weigh yourself daily and record, and bring that "weight diary" to your next appointment.  (Weight gain of 2-3 pounds in 1 day typically means fluid weight.) -The medications for your heart are to help your heart and help you live longer.   -Please contact us before stopping any of your heart medications.  Call the clinic at [phone] with questions or to reschedule future appointments.

## 2022-03-18 NOTE — Telephone Encounter (Signed)
Medication Samples have been provided to the patient.  Drug name: Delene Loll       Strength: 49/51 mg        Qty: 56  LOT: [id number]  Exp.Date: 8/25  Dosing instructions: Take one tablet twice daily  The patient has been instructed regarding the correct time, dose, and frequency of taking this medication, including desired effects and most common side effects.   Ursula Beath 10:46 AM 03/18/2022

## 2022-03-24 ENCOUNTER — Telehealth (HOSPITAL_COMMUNITY): Payer: Self-pay

## 2022-03-24 ENCOUNTER — Encounter (HOSPITAL_COMMUNITY): Payer: Self-pay

## 2022-03-24 NOTE — Telephone Encounter (Signed)
Attempted to call patient in regards to Cardiac Rehab - LM on VM Mailed letter 

## 2022-04-03 ENCOUNTER — Other Ambulatory Visit (HOSPITAL_COMMUNITY): Payer: Self-pay | Admitting: Cardiology

## 2022-04-08 ENCOUNTER — Emergency Department (HOSPITAL_COMMUNITY): Payer: 59

## 2022-04-08 ENCOUNTER — Inpatient Hospital Stay (HOSPITAL_COMMUNITY)
Admission: EM | Admit: 2022-04-08 | Discharge: 2022-04-08 | DRG: 303 | Disposition: A | Discharge status: Home / Self Care | Payer: 59 | Attending: Internal Medicine | Admitting: Internal Medicine

## 2022-04-08 ENCOUNTER — Encounter (HOSPITAL_COMMUNITY): Payer: 59 | Admitting: Cardiology

## 2022-04-08 ENCOUNTER — Inpatient Hospital Stay (HOSPITAL_COMMUNITY): Payer: 59

## 2022-04-08 ENCOUNTER — Other Ambulatory Visit: Payer: Self-pay | Admitting: Cardiology

## 2022-04-08 ENCOUNTER — Encounter (HOSPITAL_COMMUNITY): Payer: Self-pay | Admitting: Emergency Medicine

## 2022-04-08 ENCOUNTER — Other Ambulatory Visit: Payer: Self-pay

## 2022-04-08 DIAGNOSIS — I252 Old myocardial infarction: Secondary | ICD-10-CM | POA: Diagnosis not present

## 2022-04-08 DIAGNOSIS — I200 Unstable angina: Secondary | ICD-10-CM | POA: Diagnosis not present

## 2022-04-08 DIAGNOSIS — G4733 Obstructive sleep apnea (adult) (pediatric): Secondary | ICD-10-CM | POA: Diagnosis not present

## 2022-04-08 DIAGNOSIS — I130 Hypertensive heart and chronic kidney disease with heart failure and stage 1 through stage 4 chronic kidney disease, or unspecified chronic kidney disease: Secondary | ICD-10-CM | POA: Diagnosis present

## 2022-04-08 DIAGNOSIS — Z83511 Family history of glaucoma: Secondary | ICD-10-CM

## 2022-04-08 DIAGNOSIS — R109 Unspecified abdominal pain: Secondary | ICD-10-CM | POA: Diagnosis present

## 2022-04-08 DIAGNOSIS — I25118 Atherosclerotic heart disease of native coronary artery with other forms of angina pectoris: Secondary | ICD-10-CM

## 2022-04-08 DIAGNOSIS — Z87891 Personal history of nicotine dependence: Secondary | ICD-10-CM

## 2022-04-08 DIAGNOSIS — Z955 Presence of coronary angioplasty implant and graft: Secondary | ICD-10-CM

## 2022-04-08 DIAGNOSIS — Z881 Allergy status to other antibiotic agents status: Secondary | ICD-10-CM

## 2022-04-08 DIAGNOSIS — Z7902 Long term (current) use of antithrombotics/antiplatelets: Secondary | ICD-10-CM | POA: Diagnosis not present

## 2022-04-08 DIAGNOSIS — I25110 Atherosclerotic heart disease of native coronary artery with unstable angina pectoris: Principal | ICD-10-CM | POA: Diagnosis present

## 2022-04-08 DIAGNOSIS — E1122 Type 2 diabetes mellitus with diabetic chronic kidney disease: Secondary | ICD-10-CM | POA: Diagnosis not present

## 2022-04-08 DIAGNOSIS — R9431 Abnormal electrocardiogram [ECG] [EKG]: Secondary | ICD-10-CM | POA: Diagnosis not present

## 2022-04-08 DIAGNOSIS — I714 Abdominal aortic aneurysm, without rupture, unspecified: Secondary | ICD-10-CM | POA: Diagnosis present

## 2022-04-08 DIAGNOSIS — Z833 Family history of diabetes mellitus: Secondary | ICD-10-CM | POA: Diagnosis not present

## 2022-04-08 DIAGNOSIS — Z825 Family history of asthma and other chronic lower respiratory diseases: Secondary | ICD-10-CM

## 2022-04-08 DIAGNOSIS — E1169 Type 2 diabetes mellitus with other specified complication: Secondary | ICD-10-CM | POA: Diagnosis not present

## 2022-04-08 DIAGNOSIS — R509 Fever, unspecified: Secondary | ICD-10-CM | POA: Diagnosis not present

## 2022-04-08 DIAGNOSIS — R5383 Other fatigue: Secondary | ICD-10-CM | POA: Diagnosis not present

## 2022-04-08 DIAGNOSIS — Z7984 Long term (current) use of oral hypoglycemic drugs: Secondary | ICD-10-CM | POA: Diagnosis not present

## 2022-04-08 DIAGNOSIS — Z91041 Radiographic dye allergy status: Secondary | ICD-10-CM

## 2022-04-08 DIAGNOSIS — E1121 Type 2 diabetes mellitus with diabetic nephropathy: Secondary | ICD-10-CM

## 2022-04-08 DIAGNOSIS — E1142 Type 2 diabetes mellitus with diabetic polyneuropathy: Secondary | ICD-10-CM | POA: Diagnosis present

## 2022-04-08 DIAGNOSIS — Z79899 Other long term (current) drug therapy: Secondary | ICD-10-CM

## 2022-04-08 DIAGNOSIS — E119 Type 2 diabetes mellitus without complications: Secondary | ICD-10-CM

## 2022-04-08 DIAGNOSIS — Z7982 Long term (current) use of aspirin: Secondary | ICD-10-CM

## 2022-04-08 DIAGNOSIS — R079 Chest pain, unspecified: Secondary | ICD-10-CM | POA: Diagnosis not present

## 2022-04-08 DIAGNOSIS — E785 Hyperlipidemia, unspecified: Secondary | ICD-10-CM | POA: Diagnosis not present

## 2022-04-08 DIAGNOSIS — Z8249 Family history of ischemic heart disease and other diseases of the circulatory system: Secondary | ICD-10-CM

## 2022-04-08 DIAGNOSIS — I5022 Chronic systolic (congestive) heart failure: Secondary | ICD-10-CM | POA: Diagnosis not present

## 2022-04-08 DIAGNOSIS — I719 Aortic aneurysm of unspecified site, without rupture: Secondary | ICD-10-CM | POA: Diagnosis present

## 2022-04-08 DIAGNOSIS — N183 Chronic kidney disease, stage 3 unspecified: Secondary | ICD-10-CM | POA: Diagnosis not present

## 2022-04-08 DIAGNOSIS — I10 Essential (primary) hypertension: Secondary | ICD-10-CM | POA: Diagnosis present

## 2022-04-08 LAB — BASIC METABOLIC PANEL
Anion gap: 13 (ref 5–15)
BUN: 34 mg/dL — ABNORMAL HIGH (ref 6–20)
CO2: 18 mmol/L — ABNORMAL LOW (ref 22–32)
Calcium: 8.9 mg/dL (ref 8.9–10.3)
Chloride: 107 mmol/L (ref 98–111)
Creatinine, Ser: 2 mg/dL — ABNORMAL HIGH (ref 0.61–1.24)
GFR, Estimated: 41 mL/min — ABNORMAL LOW (ref >60)
Glucose, Bld: 165 mg/dL — ABNORMAL HIGH (ref 70–99)
Potassium: 4.2 mmol/L (ref 3.5–5.1)
Sodium: 138 mmol/L (ref 135–145)

## 2022-04-08 LAB — CBG MONITORING, ED
Glucose-Capillary: 178 mg/dL — ABNORMAL HIGH (ref 70–99)
Glucose-Capillary: 105 mg/dL — ABNORMAL HIGH (ref 70–99)

## 2022-04-08 LAB — TROPONIN I (HIGH SENSITIVITY)
Troponin I (High Sensitivity): 11 ng/L (ref <18)
Troponin I (High Sensitivity): 12 ng/L (ref <18)

## 2022-04-08 LAB — ECHOCARDIOGRAM COMPLETE
AR max vel: 3.21 cm2
AV Area VTI: 3.63 cm2
AV Area mean vel: 3.35 cm2
AV Mean grad: 2 mmHg
AV Peak grad: 4.9 mmHg
Ao pk vel: 1.11 m/s
Area-P 1/2: 3.39 cm2
Calc EF: 46.4 %
Height: 70.5 in
MV VTI: 3.16 cm2
S' Lateral: 3.4 cm
Single Plane A2C EF: 47 %
Single Plane A4C EF: 43.5 %
Weight: 3680 oz

## 2022-04-08 LAB — CBC
HCT: 31.2 % — ABNORMAL LOW (ref 39.0–52.0)
Hemoglobin: 10.6 g/dL — ABNORMAL LOW (ref 13.0–17.0)
MCH: 29.4 pg (ref 26.0–34.0)
MCHC: 34 g/dL (ref 30.0–36.0)
MCV: 86.7 fL (ref 80.0–100.0)
Platelets: 309 10*3/uL (ref 150–400)
RBC: 3.6 MIL/uL — ABNORMAL LOW (ref 4.22–5.81)
RDW: 13.1 % (ref 11.5–15.5)
WBC: 10.7 10*3/uL — ABNORMAL HIGH (ref 4.0–10.5)
nRBC: 0 % (ref 0.0–0.2)

## 2022-04-08 MED ORDER — ASPIRIN 81 MG PO TBEC
81.0000 mg | DELAYED_RELEASE_TABLET | Freq: Every day | ORAL | Status: DC
  Administered 2022-04-08: 81 mg via ORAL
  Filled 2022-04-08: qty 1

## 2022-04-08 MED ORDER — ASPIRIN 81 MG PO TBEC: 81.0000 mg | DELAYED_RELEASE_TABLET | Freq: Every day | ORAL | 12 refills | Status: AC

## 2022-04-08 MED ORDER — ONDANSETRON HCL 4 MG/2ML IJ SOLN: 4.0000 mg | Freq: Four times a day (QID) | INTRAMUSCULAR | Status: DC | PRN

## 2022-04-08 MED ORDER — CLOPIDOGREL BISULFATE 75 MG PO TABS
75.0000 mg | ORAL_TABLET | Freq: Every day | ORAL | Status: DC
  Administered 2022-04-08: 75 mg via ORAL
  Filled 2022-04-08: qty 1

## 2022-04-08 MED ORDER — DIPHENHYDRAMINE HCL 25 MG PO CAPS: 50.0000 mg | ORAL_CAPSULE | Freq: Once | ORAL | Status: DC

## 2022-04-08 MED ORDER — NITROGLYCERIN 0.4 MG SL SUBL: 0.4000 mg | SUBLINGUAL_TABLET | SUBLINGUAL | Status: DC | PRN

## 2022-04-08 MED ORDER — DIPHENHYDRAMINE HCL 50 MG/ML IJ SOLN: 50.0000 mg | Freq: Once | INTRAMUSCULAR | Status: DC

## 2022-04-08 MED ORDER — ATORVASTATIN CALCIUM 40 MG PO TABS
80.0000 mg | ORAL_TABLET | Freq: Every day | ORAL | Status: DC
  Administered 2022-04-08: 80 mg via ORAL
  Filled 2022-04-08: qty 2

## 2022-04-08 MED ORDER — HEPARIN SODIUM (PORCINE) 5000 UNIT/ML IJ SOLN
5000.0000 [IU] | Freq: Three times a day (TID) | INTRAMUSCULAR | Status: DC
  Administered 2022-04-08 (×2): 5000 [IU] via SUBCUTANEOUS
  Filled 2022-04-08 (×2): qty 1

## 2022-04-08 MED ORDER — ISOSORBIDE MONONITRATE ER 30 MG PO TB24: 30.0000 mg | ORAL_TABLET | Freq: Every day | ORAL | 2 refills | Status: DC

## 2022-04-08 MED ORDER — INSULIN ASPART 100 UNIT/ML IJ SOLN
0.0000 [IU] | Freq: Three times a day (TID) | INTRAMUSCULAR | Status: DC
  Administered 2022-04-08: 2 [IU] via SUBCUTANEOUS

## 2022-04-08 MED ORDER — ACETAMINOPHEN 325 MG PO TABS: 650.0000 mg | ORAL_TABLET | ORAL | Status: DC | PRN

## 2022-04-08 MED ORDER — EZETIMIBE 10 MG PO TABS: 10.0000 mg | ORAL_TABLET | Freq: Every day | ORAL | Status: DC

## 2022-04-08 MED ORDER — METHYLPREDNISOLONE SODIUM SUCC 125 MG IJ SOLR: 40.0000 mg | Freq: Once | INTRAMUSCULAR | Status: DC

## 2022-04-08 NOTE — ED Triage Notes (Addendum)
Pt here POV for abd pain that radiates into chest, states that he endorsed L arm and leg numbness yesterday. States that he is "very tired". States similar pain when admitted in December. C/o back pain that has been persistent since admission. Denies N/V, ShOB, and dizziness.

## 2022-04-08 NOTE — ED Provider Notes (Signed)
[facility] Provider Note   CSN: [id number] Arrival date & time: 04/08/22  0025     History  Chief Complaint  Patient presents with   Abdominal Pain   Chest Pain    Shaun Cummings is a 47 y.o. male.  The history is provided by the patient.  Patient with extensive history including diabetes, CAD presents with chest and abdominal pain. Patient reports he woke up and had upper abdominal pain that moved to his chest.  He also reports feeling diaphoretic.  No fevers or vomiting.  No significant shortness of breath.  He reports this feels similar to prior episodes when he had an MI He reports med compliance  He reports has had recent leg numbness, but that was not new tonight.  No focal weakness reported    Past Medical History:  Diagnosis Date   Abdominal pain 01/07/2022   Angina pectoris ([place]) 12/17/2021   Blurred vision 02/04/2021   Chest pain    Elevated troponin 01/04/2022   HTN (hypertension)    Hyperlipidemia    Hypertensive emergency 01/04/2022   Infected epithelial inclusion cyst 03/14/2018   Leg edema, right 12/31/2021   Lesion of penis    foreskin   OSA (obstructive sleep apnea)    per pt dx osa and used cpap but after losing wt. from 415 pounds down to 244 pounds no longer needs cpap   Peripheral neuropathy    Peripheral neuropathy 05/21/2017   Phimosis    Scrotal abscess 05/08/2017   Type 2 diabetes mellitus ([place])    per pt no meds for two years pt was changed to levemir unable to afford but pt states is waiting on approval for a program he applied for that will pay for his meds ([place])      Home Medications Prior to Admission medications   Medication Sig Start Date End Date Taking? Authorizing Provider  atorvastatin (LIPITOR) 80 MG tablet Take 1 tablet by mouth once daily 04/03/22   Sabharwal, Aditya, DO  blood glucose meter kit and supplies Dispense based on patient and insurance preference. Use up  to four times daily as directed. (FOR ICD-10 E10.9, E11.9). 03/05/22   Idamae Schuller, MD  carvedilol (COREG) 25 MG tablet Take 1 tablet (25 mg total) by mouth 2 (two) times daily with a meal. 03/18/22 04/17/22  Sabharwal, Aditya, DO  clopidogrel (PLAVIX) 75 MG tablet Take 1 tablet (75 mg total) by mouth daily. 03/16/22   Sabharwal, Aditya, DO  empagliflozin (JARDIANCE) 10 MG TABS tablet Take 1 tablet (10 mg total) by mouth daily before breakfast. 03/18/22 03/13/23  Sabharwal, Aditya, DO  ezetimibe (ZETIA) 10 MG tablet Take 1 tablet (10 mg total) by mouth daily. 03/06/22 03/06/23  Idamae Schuller, MD  glipiZIDE (GLUCOTROL) 5 MG tablet Take 1 tablet (5 mg total) by mouth daily before breakfast. 05/21/21 02/25/23  Gaylan Gerold, DO  nitroGLYCERIN (NITROSTAT) 0.4 MG SL tablet Place 1 tablet (0.4 mg total) under the tongue every 5 (five) minutes as needed for chest pain. 01/30/22 04/30/22  Joette Catching, PA-C  sacubitril-valsartan (ENTRESTO) 49-51 MG Take 1 tablet by mouth 2 (two) times daily. 03/18/22   Sabharwal, Aditya, DO      Allergies    Augmentin [amoxicillin-pot clavulanate] and Contrast media [iodinated contrast media]    Review of Systems   Review of Systems  Constitutional:  Negative for fever.  Cardiovascular:  Positive for chest pain.    Physical Exam Updated  Vital Signs BP 121/78   Pulse 75   Temp 98 F (36.7 C)   Resp 15   Ht 1.791 m (5' 10.5")   Wt 104.3 kg   SpO2 97%   BMI 32.54 kg/m  Physical Exam CONSTITUTIONAL: Chronically ill-appearing HEAD: Normocephalic/atraumatic EYES: EOMI/PERRL ENMT: Mucous membranes moist NECK: supple no meningeal signs SPINE/BACK:entire spine nontender CV: S1/S2 noted, no murmurs/rubs/gallops noted LUNGS: Lungs are clear to auscultation bilaterally, no apparent distress ABDOMEN: soft, nontender, no rebound or guarding, bowel sounds noted throughout abdomen GU:no cva tenderness NEURO: Pt is awake/alert/appropriate, moves all extremitiesx4.  No facial  droop.  No arm or leg drift EXTREMITIES: pulses normal/equalx4, full ROM SKIN: warm, color normal PSYCH: no abnormalities of mood noted, alert and oriented to situation  ED Results / Procedures / Treatments   Labs (all labs ordered are listed, but only abnormal results are displayed) Labs Reviewed  BASIC METABOLIC PANEL - Abnormal; Notable for the following components:      Result Value   CO2 18 (*)    Glucose, Bld 165 (*)    BUN 34 (*)    Creatinine, Ser 2.00 (*)    GFR, Estimated 41 (*)    All other components within normal limits  CBC - Abnormal; Notable for the following components:   WBC 10.7 (*)    RBC 3.60 (*)    Hemoglobin 10.6 (*)    HCT 31.2 (*)    All other components within normal limits  TROPONIN I (HIGH SENSITIVITY)  TROPONIN I (HIGH SENSITIVITY)    EKG EKG Interpretation  Date/Time:  Wednesday April 08 2022 00:31:31 EDT Ventricular Rate:  75 PR Interval:  170 QRS Duration: 94 QT Interval:  432 QTC Calculation: 482 R Axis:   -3 Text Interpretation: Normal sinus rhythm Nonspecific T wave abnormality Prolonged QT Confirmed by Ripley Fraise [phone]) on 04/08/2022 2:39:57 AM  Radiology DG Chest 2 View  Result Date: 04/08/2022 CLINICAL DATA:  Chest pain, fatigue, and fever EXAM: CHEST - 2 VIEW COMPARISON:  01/04/2022 FINDINGS: Stable cardiomediastinal silhouette. No focal consolidation, pleural effusion, or pneumothorax. No acute osseous abnormality. Mild wedging of a lower thoracic vertebral body is unchanged. IMPRESSION: No active cardiopulmonary disease. Electronically Signed   By: Placido Sou M.D.   On: 04/08/2022 01:02    Procedures Procedures    Medications Ordered in ED Medications  aspirin EC tablet 81 mg (has no administration in time range)  nitroGLYCERIN (NITROSTAT) SL tablet 0.4 mg (has no administration in time range)  acetaminophen (TYLENOL) tablet 650 mg (has no administration in time range)  ondansetron (ZOFRAN) injection 4 mg (has no  administration in time range)  heparin injection 5,000 Units (has no administration in time range)  insulin aspart (novoLOG) injection 0-9 Units (has no administration in time range)    ED Course/ Medical Decision Making/ A&P Clinical Course as of 04/08/22 0606  Wed Apr 08, 2022  0434 Stable, no new symptoms.  However he has significant history.  Discussed with cardiology who will see the patient [DW]  [phone] Patient stable at this time.  He does have previous history of penetrating ulcer to the abdominal aorta, but this has been stable per vascular.  He has no focal abdominal tenderness.  He has pulses in all extremities.  Patient tells me the symptoms were similar to previous cardiac issues.  Will defer CT imaging of his chest and abdomen for now.  Patient was previously recommended for CABG, but this was deferred due to renal  function.  Cardiology consult to see if he needs to be admitted for further evaluation [DW]    Clinical Course User Index [DW] Ripley Fraise, MD                             Medical Decision Making Amount and/or Complexity of Data Reviewed Labs: ordered. Radiology: ordered.  Risk Decision regarding hospitalization.   This patient presents to the ED for concern of chest pain, this involves an extensive number of treatment options, and is a complaint that carries with it a high risk of complications and morbidity.  The differential diagnosis includes but is not limited to acute coronary syndrome, aortic dissection, pulmonary embolism, pericarditis, pneumothorax, pneumonia, myocarditis, pleurisy, esophageal rupture    Comorbidities that complicate the patient evaluation: Patient's presentation is complicated by their history of CAD  Additional history obtained: Additional history obtained from spouse Records reviewed previous admission documents  Lab Tests: I Ordered, and personally interpreted labs.  The pertinent results include: Chronic renal  insufficiency, anemia  Imaging Studies ordered: I ordered imaging studies including X-ray chest x-ray   I independently visualized and interpreted imaging which showed no acute findings I agree with the radiologist interpretation  Cardiac Monitoring: The patient was maintained on a cardiac monitor.  I personally viewed and interpreted the cardiac monitor which showed an underlying rhythm of:  sinus rhythm   Critical Interventions:  cardiology admission  Consultations Obtained: I requested consultation with the consultant dr Humphrey Rolls , and discussed  findings as well as pertinent plan - they recommend: will admit  Reevaluation: After the interventions noted above, I reevaluated the patient and found that they have :stayed the same  Complexity of problems addressed: Patient's presentation is most consistent with  acute presentation with potential threat to life or bodily function  Disposition: After consideration of the diagnostic results and the patient's response to treatment,  I feel that the patent would benefit from admission   .           Final Clinical Impression(s) / ED Diagnoses Final diagnoses:  Unstable angina [facility])    Rx / DC Orders ED Discharge Orders     None         Ripley Fraise, MD 04/08/22 [phone]

## 2022-04-08 NOTE — Progress Notes (Signed)
Rounding Note    Patient Name: Shaun Cummings Date of Encounter: 04/08/2022  [place] Cardiologist: Freada Bergeron, MD   Subjective   No chest pain this morning. Family at the bedside.   Inpatient Medications    Scheduled Meds:  aspirin EC  81 mg Oral Daily   heparin  5,000 Units Subcutaneous Q8H   insulin aspart  0-9 Units Subcutaneous TID WC   Continuous Infusions:  PRN Meds: acetaminophen, nitroGLYCERIN, ondansetron (ZOFRAN) IV   Vital Signs    Vitals:   04/08/22 0845 04/08/22 0900 04/08/22 0915 04/08/22 0930  BP: 121/68 (!) 141/61 (!) 130/59 (!) 148/63  Pulse: 65 63 62 66  Resp: '16 17 16 15  '$ Temp:      TempSrc:      SpO2: 98% 98% 97% 98%  Weight:      Height:       No intake or output data in the 24 hours ending 04/08/22 0959    04/08/2022   12:35 AM 03/18/2022   10:13 AM 03/05/2022    1:18 PM  Last 3 Weights  Weight (lbs) 230 lb 231 lb 6.4 oz 233 lb 12.8 oz  Weight (kg) 104.327 kg 104.962 kg 106.051 kg      Telemetry    Sinus Rhythm - Personally Reviewed  ECG    Sinus Rhythm, 75 bpm no specific changes - Personally Reviewed  Physical Exam   GEN: No acute distress.   Neck: No JVD Cardiac: RRR, no murmurs, rubs, or gallops.  Respiratory: Clear to auscultation bilaterally. GI: Soft, nontender, non-distended  MS: No edema; No deformity. Neuro:  Nonfocal  Psych: Normal affect   Labs    High Sensitivity Troponin:   Recent Labs  Lab 04/08/22 0045 04/08/22 0219  TROPONINIHS 11 12     Chemistry Recent Labs  Lab 04/08/22 0045  NA 138  K 4.2  CL 107  CO2 18*  GLUCOSE 165*  BUN 34*  CREATININE 2.00*  CALCIUM 8.9  GFRNONAA 41*  ANIONGAP 13    Lipids No results for input(s): "CHOL", "TRIG", "HDL", "LABVLDL", "LDLCALC", "CHOLHDL" in the last 168 hours.  Hematology Recent Labs  Lab 04/08/22 0045  WBC 10.7*  RBC 3.60*  HGB 10.6*  HCT 31.2*  MCV 86.7  MCH 29.4  MCHC 34.0  RDW 13.1  PLT 309   Thyroid No  results for input(s): "TSH", "FREET4" in the last 168 hours.  BNPNo results for input(s): "BNP", "PROBNP" in the last 168 hours.  DDimer No results for input(s): "DDIMER" in the last 168 hours.   Radiology    DG Chest 2 View  Result Date: 04/08/2022 CLINICAL DATA:  Chest pain, fatigue, and fever EXAM: CHEST - 2 VIEW COMPARISON:  01/04/2022 FINDINGS: Stable cardiomediastinal silhouette. No focal consolidation, pleural effusion, or pneumothorax. No acute osseous abnormality. Mild wedging of a lower thoracic vertebral body is unchanged. IMPRESSION: No active cardiopulmonary disease. Electronically Signed   By: Placido Sou M.D.   On: 04/08/2022 01:02    Cardiac Studies   Echo: 12/2021  IMPRESSIONS     1. Left ventricular ejection fraction, by estimation, is 35 to 40%. The  left ventricle has moderately decreased function. The left ventricle  demonstrates regional wall motion abnormalities (see scoring  diagram/findings for description). The left  ventricular internal cavity size was mildly dilated. Left ventricular  diastolic parameters are consistent with Grade I diastolic dysfunction  (impaired relaxation). There is hypokinesis of the left ventricular,  septal wall,  anterior wall and lateral wall.   There is severe hypokinesis of the left ventricular, basal-mid inferior  wall.   2. Right ventricular systolic function is normal. The right ventricular  size is normal.   3. Left atrial size was moderately dilated.   4. Right atrial size was mildly dilated.   5. The mitral valve is normal in structure. Trivial mitral valve  regurgitation. No evidence of mitral stenosis.   6. The aortic valve is grossly normal. Aortic valve regurgitation is not  visualized. No aortic stenosis is present.   7. The inferior vena cava is normal in size with greater than 50%  respiratory variability, suggesting right atrial pressure of 3 mmHg.   Comparison(s): No prior Echocardiogram.    Conclusion(s)/Recommendation(s): Reduced LVEF, with global hypokinesis but  also severe focal hypokinesis of basal to mid inferior wall. Results will  be communicated to primary team.   FINDINGS   Left Ventricle: Left ventricular ejection fraction, by estimation, is 35  to 40%. The left ventricle has moderately decreased function. The left  ventricle demonstrates regional wall motion abnormalities. Severe  hypokinesis of the left ventricular,  basal-mid inferior wall. The left ventricular internal cavity size was  mildly dilated. There is borderline left ventricular hypertrophy. Left  ventricular diastolic parameters are consistent with Grade I diastolic  dysfunction (impaired relaxation).   Right Ventricle: The right ventricular size is normal. No increase in  right ventricular wall thickness. Right ventricular systolic function is  normal.   Left Atrium: Left atrial size was moderately dilated.   Right Atrium: Right atrial size was mildly dilated.   Pericardium: There is no evidence of pericardial effusion.   Mitral Valve: The mitral valve is normal in structure. Trivial mitral  valve regurgitation. No evidence of mitral valve stenosis.   Tricuspid Valve: The tricuspid valve is normal in structure. Tricuspid  valve regurgitation is trivial. No evidence of tricuspid stenosis.   Aortic Valve: The aortic valve is grossly normal. Aortic valve  regurgitation is not visualized. No aortic stenosis is present.   Pulmonic Valve: The pulmonic valve was grossly normal. Pulmonic valve  regurgitation is not visualized. No evidence of pulmonic stenosis.   Aorta: The aortic root, ascending aorta and aortic arch are all  structurally normal, with no evidence of dilitation or obstruction.   Venous: The inferior vena cava is normal in size with greater than 50%  respiratory variability, suggesting right atrial pressure of 3 mmHg.   IAS/Shunts: The atrial septum is grossly normal.    Cath: 12/2021  Successful PCI of severe stenosis in the mid RCA with a 3.5 x 24 mm Synergy DES, reducing 95% ulcerated plaque to 0% post PCI, TIMI-3 flow pre and post.   Recommendations: Aggressive medical therapy for residual CAD, DAPT with aspirin and clopidogrel x 12 months without interruption.  Follow creatinine closely to evaluate for AKI.  Total contrast 40 cc used for this procedure.  Diagnostic Dominance: Right  Intervention    Patient Profile     47 y.o. male pmh sx for HFrEF, HTN, DMII, HLD, CAD, hx of NSTEMI  s/p RCA stent, and aortic ulcer who is being seen 04/08/2022 for the evaluation of chest pain.    Assessment & Plan    Abdominal pain Chest pain CAD, multivessel disease s/p PCI of RCA -- presented with abd pain which radiated up in to the lower part of his chest which was similar to what he experienced back 12/2021 with prior PCI. hsTn negative. Currently  pain free. Given known CAD and recurrent symptoms discussed proceeding with nuclear stress to assess for ischemia in LAD and Lcx territories.  -- NPO -- plan for nuclear stress today -- continue ASA, plavix (released orders), statin, Zetia. Hold coreg with plans for stress test  HFrEF ICM -- known LVEF of 35-40%, does not appear volume overloaded on exam -- GDMT: home meds of coreg, entresto, jardiance, spiro   HTN -- elevated in the ED but has not received meds -- holding am dose of coreg for stress test  HLD -- continue statin and Zetia  CKD stage III -- Cr 2 on admission, will hold Entresto for now. Baseline appears 1.75-2.0  DM -- SSI while inpatient  -- resume home Jardiance   Aortic penetrating ulcer -- follows with [facility] outpatient, last visit was 01/2022 with rec's for follow up in 6 months   For questions or updates, please contact [place] Please consult [email or web address] for contact info under        Signed, Reino Bellis, NP  04/08/2022, 9:59 AM

## 2022-04-08 NOTE — Progress Notes (Signed)
Heart Failure Navigator Progress Note  Assessed for Heart & Vascular TOC clinic readiness.  Patient does not meet criteria due to Advanced Heart Failure Team patient.   Navigator will sign off at this time.    Chanin Frumkin, BSN, RN Heart Failure Nurse Navigator Secure Chat Only   

## 2022-04-08 NOTE — ED Provider Triage Note (Signed)
Emergency Medicine Provider Triage Evaluation Note  Shaun Cummings , a 47 y.o. male  was evaluated in triage.  Pt complains of abdominal and chest pain, came on suddenly tonight, states he feels very tired and fatigued, like when he had his heart attack back in December, no shortness of breath, no pleuritic chest pain, no nausea no vomiting..  Reviewed patient's chart has a history of NSTEMI, also has abdominal AAA,  Review of Systems  Positive: Chest pain, weakness Negative: Nausea vomiting  Physical Exam  BP 105/63 (BP Location: Right Arm)   Pulse 73   Temp 98.5 F (36.9 C) (Oral)   Resp 18   Ht 5' 10.5" (1.791 m)   Wt 104.3 kg   SpO2 100%   BMI 32.54 kg/m  Gen:   Awake, no distress   Resp:  Normal effort  MSK:   Moves extremities without difficulty  Other:    Medical Decision Making  Medically screening exam initiated at 1:25 AM.  Appropriate orders placed.  Shaun Cummings was informed that the remainder of the evaluation will be completed by another provider, this initial triage assessment does not replace that evaluation, and the importance of remaining in the ED until their evaluation is complete.  Presents with chest pain and abdominal pain, patient is pale in appearance, has a history of abdominal aneurysm, concern for rupture at this time, will send down for CTA dissection study, charge nurse was made aware patient will need immediate rooming.   Marcello Fennel, PA-C 04/08/22 0126

## 2022-04-08 NOTE — H&P (Signed)
Cardiology Admission History and Physical   Patient ID: Shaun Cummings MRN: [id number]; DOB: Nov 03, 1975   Admission date: 04/08/2022  PCP:  Iona Beard, MD   [facility] Cardiologist:  Freada Bergeron, MD        Chief Complaint:  Chest pain  Patient Profile:   Shaun Cummings is a 47 y.o. male with pmh sx for  HTN, DMII, HLD, CAD, hx of NSTEMI  s/p RCA stent, and aortic ulcer who is being seen 04/08/2022 for the evaluation of chest pain.  History of Present Illness:   Shaun Cummings is a 47 y.o. male with pmh sx for  HTN, DMII, HLD, CAD, hx of NSTEMI  s/p RCA stent, and aortic ulcer who is being seen 04/08/2022 for the evaluation of chest pain. In Dec 2023: CTA chest abdomen pelvis showed mural thrombus involving the abdominal aorta and a penetrating ulcer in the infrarenal abdominal aorta. Vascular surgery was consulted and he was managed conservatively at that time. He had an echocardiogram during the admission demonstrating LVEF of 35 to 40%, left heart cath with severe three-vessel disease. Due to concern for multiple core morbidities he underwent high risk PCI with PCI to the mid RCA by Dr. Burt Knack.   Since then he says he has significantly changed his lifestyle. Quit smoking. No alcohol use. He currently works at the [employer]. Today around midnight e endorsed L arm and leg numbness. Hence came to the ED, but by then CP had resolved. Feels very tired.  Denies N/V, ShOB, and dizziness.  Troponin and EKG without any ischemic signs in the ED. Considering significant risk factors, cardiology called for admission.    Past Medical History:  Diagnosis Date   Abdominal pain 01/07/2022   Angina pectoris ([place]) 12/17/2021   Blurred vision 02/04/2021   Chest pain    Elevated troponin 01/04/2022   HTN (hypertension)    Hyperlipidemia    Hypertensive emergency 01/04/2022   Infected epithelial inclusion cyst 03/14/2018   Leg edema, right 12/31/2021   Lesion of  penis    foreskin   OSA (obstructive sleep apnea)    per pt dx osa and used cpap but after losing wt. from 415 pounds down to 244 pounds no longer needs cpap   Peripheral neuropathy    Peripheral neuropathy 05/21/2017   Phimosis    Scrotal abscess 05/08/2017   Type 2 diabetes mellitus ([place])    per pt no meds for two years pt was changed to levemir unable to afford but pt states is waiting on approval for a program he applied for that will pay for his meds ([place])      Past Surgical History:  Procedure Laterality Date   CIRCUMCISION N/A 02/10/2016   Procedure: CIRCUMCISION ADULT;  Surgeon: Cleon Gustin, MD;  Location: [facility];  Service: Urology;  Laterality: N/A;   CORONARY STENT INTERVENTION N/A 01/14/2022   Procedure: CORONARY STENT INTERVENTION;  Surgeon: Sherren Mocha, MD;  Location: [place] [facility];  Service: Cardiovascular;  Laterality: N/A;   INCISION AND DRAINAGE ABSCESS Right 05/12/2017   Procedure: INCISION AND DRAINAGE RIGHT INGUINAL ABSCESS;  Surgeon: Erroll Luna, MD;  Location: [place];  Service: General;  Laterality: Right;   INCISION AND DRAINAGE ABSCESS N/A 03/24/2018   Procedure: INCISION AND DRAINAGE POSTERIOR NECK ABSCESS;  Surgeon: Excell Seltzer, MD;  Location: [place];  Service: General;  Laterality: N/A;   INCISION AND DRAINAGE ABSCESS Right 06/19/2020   Procedure:  INCISION AND DRAINAGE ABSCESS, RIGHT GROIN;  Surgeon: Johnathan Hausen, MD;  Location: [facility];  Service: General;  Laterality: Right;   INCISION AND DRAINAGE PERIRECTAL ABSCESS Right 05/09/2017   Procedure: IRRIGATION AND DEBRIDEMENT PERINEAL ABSCESS;  Surgeon: Donnie Mesa, MD;  Location: [place];  Service: General;  Laterality: Right;   LEFT HEART CATH AND CORONARY ANGIOGRAPHY N/A 01/06/2022   Procedure: LEFT HEART CATH AND CORONARY ANGIOGRAPHY;  Surgeon: Troy Sine, MD;  Location: [place] [facility];  Service: Cardiovascular;  Laterality: N/A;      Medications Prior to Admission: Prior to Admission medications   Medication Sig Start Date End Date Taking? Authorizing Provider  atorvastatin (LIPITOR) 80 MG tablet Take 1 tablet by mouth once daily 04/03/22   Sabharwal, Aditya, DO  blood glucose meter kit and supplies Dispense based on patient and insurance preference. Use up to four times daily as directed. (FOR ICD-10 E10.9, E11.9). 03/05/22   Idamae Schuller, MD  carvedilol (COREG) 25 MG tablet Take 1 tablet (25 mg total) by mouth 2 (two) times daily with a meal. 03/18/22 04/17/22  Sabharwal, Aditya, DO  clopidogrel (PLAVIX) 75 MG tablet Take 1 tablet (75 mg total) by mouth daily. 03/16/22   Sabharwal, Aditya, DO  empagliflozin (JARDIANCE) 10 MG TABS tablet Take 1 tablet (10 mg total) by mouth daily before breakfast. 03/18/22 03/13/23  Sabharwal, Aditya, DO  ezetimibe (ZETIA) 10 MG tablet Take 1 tablet (10 mg total) by mouth daily. 03/06/22 03/06/23  Idamae Schuller, MD  glipiZIDE (GLUCOTROL) 5 MG tablet Take 1 tablet (5 mg total) by mouth daily before breakfast. 05/21/21 02/25/23  Gaylan Gerold, DO  nitroGLYCERIN (NITROSTAT) 0.4 MG SL tablet Place 1 tablet (0.4 mg total) under the tongue every 5 (five) minutes as needed for chest pain. 01/30/22 04/30/22  Joette Catching, PA-C  sacubitril-valsartan (ENTRESTO) 49-51 MG Take 1 tablet by mouth 2 (two) times daily. 03/18/22   Hebert Soho, DO     Allergies:    Allergies  Allergen Reactions   Augmentin [Amoxicillin-Pot Clavulanate] Hives and Itching    Has patient had a PCN reaction causing immediate rash, facial/tongue/throat swelling, SOB or lightheadedness with hypotension: Yes Has patient had a PCN reaction causing severe rash involving mucus membranes or skin necrosis: No Has patient had a PCN reaction that required hospitalization: No Has patient had a PCN reaction occurring within the last 10 years: No If all of the above answers are "NO", then may proceed with Cephalosporin use.   Contrast Media  [Iodinated Contrast Media] Hives    Pt broke out with 1 hive on forehead after CT injection-treated with '25mg'$  Benadryl    Social History:   Social History   Socioeconomic History   Marital status: Married    Spouse name: Jerral Bonito   Number of children: 2   Years of education: college    Highest education level: Not on file  Occupational History   Occupation: Group Home   Tobacco Use   Smoking status: Former    Years: 2.00    Types: Cigarettes    Quit date: 02/05/2008    Years since quitting: 14.1   Smokeless tobacco: Never  Vaping Use   Vaping Use: Never used  Substance and Sexual Activity   Alcohol use: No   Drug use: Not Currently    Types: Marijuana    Comment: quit 1 month ago.   Sexual activity: Not on file  Other Topics Concern   Not on file  Social History Narrative  Lives with wife and 2 kids, age 17 and 87.    Social Determinants of Health   Financial Resource Strain: Low Risk  (03/05/2022)   Overall Financial Resource Strain (CARDIA)    Difficulty of Paying Living Expenses: Not very hard  Food Insecurity: Food Insecurity Present (03/05/2022)   Hunger Vital Sign    Worried About Running Out of Food in the Last Year: Sometimes true    Ran Out of Food in the Last Year: Sometimes true  Transportation Needs: No Transportation Needs (03/05/2022)   PRAPARE - Hydrologist (Medical): No    Lack of Transportation (Non-Medical): No  Physical Activity: Inactive (03/05/2022)   Exercise Vital Sign    Days of Exercise per Week: 0 days    Minutes of Exercise per Session: 0 min  Stress: No Stress Concern Present (03/05/2022)   [place]    Feeling of Stress : Not at all  Social Connections: Moderately Integrated (03/05/2022)   Social Connection and Isolation Panel [NHANES]    Frequency of Communication with Friends and Family: More than three times a week    Frequency of Social Gatherings  with Friends and Family: Three times a week    Attends Religious Services: 1 to 4 times per year    Active Member of Clubs or Organizations: No    Attends Archivist Meetings: Never    Marital Status: Married  Human resources officer Violence: Not At Risk (03/05/2022)   Humiliation, Afraid, Rape, and Kick questionnaire    Fear of Current or Ex-Partner: No    Emotionally Abused: No    Physically Abused: No    Sexually Abused: No    Family History:  The patient's family history includes Angina in his mother; Asthma in his son; CAD in his mother; Diabetes in his brother, father, maternal grandfather, maternal grandmother, mother, paternal grandfather, and paternal grandmother; Glaucoma in his father, maternal grandfather, maternal grandmother, paternal grandfather, and paternal grandmother; Heart attack in his father; Heart disease in his maternal grandfather, maternal grandmother, and mother; Kidney failure in his father.    ROS:  Please see the history of present illness.  All other ROS reviewed and negative.     Physical Exam/Data:   Vitals:   04/08/22 0035 04/08/22 0230 04/08/22 0445 04/08/22 0502  BP:  (!) 155/81  121/78  Pulse:  69  75  Resp:  18  15  Temp:   98 F (36.7 C)   TempSrc:      SpO2:  99%  97%  Weight: 104.3 kg     Height: 5' 10.5" (1.791 m)      No intake or output data in the 24 hours ending 04/08/22 0517    04/08/2022   12:35 AM 03/18/2022   10:13 AM 03/05/2022    1:18 PM  Last 3 Weights  Weight (lbs) 230 lb 231 lb 6.4 oz 233 lb 12.8 oz  Weight (kg) 104.327 kg 104.962 kg 106.051 kg     Body mass index is 32.54 kg/m.  General:  Well nourished, well developed, in no acute distress HEENT: normal Neck: no JVD Vascular: No carotid bruits; Distal pulses 2+ bilaterally   Cardiac:  normal S1, S2; RRR; no murmur  Lungs:  clear to auscultation bilaterally, no wheezing, rhonchi or rales  Abd: soft, nontender, no hepatomegaly  Ext: no edema Musculoskeletal:  No  deformities, BUE and BLE strength normal and equal Skin: warm  and dry  Neuro:  CNs 2-12 intact, no focal abnormalities noted Psych:  Normal affect    EKG:  The ECG that was done  was personally reviewed and demonstrates no ST elevation  Relevant CV Studies:  ECHO: 01/05/2022: LVEF 35 to 40%.  Normal RV function.   CATH: Dist LAD lesion is 50% stenosed.   Prox LAD lesion is 70% stenosed.   Ost RCA to Prox RCA lesion is 75% stenosed.   Prox RCA lesion is 75% stenosed.   Prox RCA to Mid RCA lesion is 95% stenosed.   Mid RCA lesion is 50% stenosed.   1st Mrg lesion is 50% stenosed.   Mid Cx-2 lesion is 70% stenosed.   2nd Mrg lesion is 95% stenosed.   Mid Cx-1 lesion is 50% stenosed. S/P PCI the following day    Laboratory Data:  High Sensitivity Troponin:   Recent Labs  Lab 04/08/22 0045 04/08/22 0219  TROPONINIHS 11 12      Chemistry Recent Labs  Lab 04/08/22 0045  NA 138  K 4.2  CL 107  CO2 18*  GLUCOSE 165*  BUN 34*  CREATININE 2.00*  CALCIUM 8.9  GFRNONAA 41*  ANIONGAP 13    No results for input(s): "PROT", "ALBUMIN", "AST", "ALT", "ALKPHOS", "BILITOT" in the last 168 hours. Lipids No results for input(s): "CHOL", "TRIG", "HDL", "LABVLDL", "LDLCALC", "CHOLHDL" in the last 168 hours. Hematology Recent Labs  Lab 04/08/22 0045  WBC 10.7*  RBC 3.60*  HGB 10.6*  HCT 31.2*  MCV 86.7  MCH 29.4  MCHC 34.0  RDW 13.1  PLT 309   Thyroid No results for input(s): "TSH", "FREET4" in the last 168 hours. BNPNo results for input(s): "BNP", "PROBNP" in the last 168 hours.  DDimer No results for input(s): "DDIMER" in the last 168 hours.   Radiology/Studies:  DG Chest 2 View  Result Date: 04/08/2022 CLINICAL DATA:  Chest pain, fatigue, and fever EXAM: CHEST - 2 VIEW COMPARISON:  01/04/2022 FINDINGS: Stable cardiomediastinal silhouette. No focal consolidation, pleural effusion, or pneumothorax. No acute osseous abnormality. Mild wedging of a lower thoracic  vertebral body is unchanged. IMPRESSION: No active cardiopulmonary disease. Electronically Signed   By: Placido Sou M.D.   On: 04/08/2022 01:02     Assessment and Plan:   # Severe 3 CVD s/p RCA PCI # HFrEF # HTN # DM # HLD # CKD Stage 3 # Penetrating aortic ulcer   -Patient coming with CP which has since then resolved. Troponin and EKG reassuring -However considering risk factors, will admit for further work up.  -He had LHC in Dec 2023 which showed 3 VD including LAD and Lcx (75% stenosis) -Status post PCI in December 2023 to the RCA. - Plavix 25 mg daily, aspirin 81, Lipitor 80 -For HFrEF, continue Entresto and BB. Hold Jardiance for now -Patient is euvolemic- no need for lasix.  -Telemetry -Repeat Echo -Insulin per protocol for DM. A1c was 9.9 -Was on insulin a few weeks but resulted in hypoglycemia so that was discontinued. Ozempic- did not tolerate either.  -BP control for aortic ulcer -Vascular surgery plan on repeating imaging in 6 months. No focal weakness -Trend Cr for CKD.     Signed, Jaci Lazier, MD  04/08/2022 5:17 AM

## 2022-04-08 NOTE — Discharge Instructions (Signed)
   You have a Stress Test scheduled at [facility]. Your doctor has ordered this test to check the blood flow in your heart arteries.  Please arrive 15 minutes early for paperwork. The whole test will take several hours. You may want to bring reading material to remain occupied while undergoing different parts of the test.  Instructions: No food/drink after midnight the night before. It is OK to take your morning meds with a sip of water EXCEPT for those types of medicines listed below or otherwise instructed. No caffeine/decaf products 24 hours before, including medicines such as Excedrin or Goody Powders. Call if there are any questions.  Wear comfortable clothes and shoes.   Special Medication Instructions: Beta blockers such as metoprolol (Lopressor/Toprol XL), atenolol (Tenormin), carvedilol (Coreg), nebivolol (Bystolic), bisoprolol (Zebeta), propranolol (Inderal) should not be taken for 24 hours before the test. Calcium channel blockers such as diltiazem (Cardizem) or verapmil ([place]) should not be taken for 24 hours before the test. Remove nitroglycerin patches and do not take nitrate preparations such as Imdur/isosorbide the day of your test. No Persantine/Theophylline or Aggrenox medicines should be used within 24 hours of the test.  If you are diabetic, please ask which medications to hold the day of the test.  What To Expect: When you arrive in the lab, the technician will inject a small amount of radioactive tracer into your arm through an IV while you are resting quietly. This helps us to form pictures of your heart. You will likely only feel a sting from the IV. After a waiting period, resting pictures will be obtained under a big camera. These are the "before" pictures.  Next, you will be prepped for the stress portion of the test. This may include either walking on a treadmill or receiving a medicine that helps to dilate blood vessels in your heart to simulate the  effect of exercise on your heart. If you are walking on a treadmill, you will walk at different paces to try to get your heart rate to a goal number that is based on your age. If your doctor has chosen the pharmacologic test, then you will receive a medicine through your IV that may cause temporary nausea, flushing, shortness of breath and sometimes chest discomfort or vomiting. This is typically short-lived and usually resolves quickly. If you experience symptoms, that does not automatically mean the test is abnormal. Some patients do not experience any symptoms at all. Your blood pressure and heart rate will be monitored, and we will be watching your EKG on a computer screen for any changes. During this portion of the test, the radiologist will inject another small amount of radioactive tracer into your IV. After a waiting period, you will undergo a second set of pictures. These are the "after" pictures.  The doctor reading the test will compare the before-and-after images to look for evidence of heart blockages or heart weakness. The test usually takes 1 day to complete, but in certain instances (for example, if a patient is over a certain weight limit), the test may be done over the span of 2 days.      

## 2022-04-08 NOTE — ED Notes (Signed)
Patient requesting for MD to speak about IV contrast prior to administration of medication. Patient reports that kidney MD does not want him to receive contrast.

## 2022-04-08 NOTE — Progress Notes (Signed)
*  PRELIMINARY RESULTS* Echocardiogram 2D Echocardiogram has been performed.  Shaun Cummings 04/08/2022, 3:11 PM

## 2022-04-08 NOTE — Discharge Summary (Addendum)
Discharge Summary    Patient ID: Shaun Cummings MRN: [id number]; DOB: Aug 29, 1975  Admit date: 04/08/2022 Discharge date: 04/08/2022  PCP:  Quincy Simmonds, MD   [facility] Providers Cardiologist:  Meriam Sprague, MD     Discharge Diagnoses    Principal Problem:   Unstable angina [facility]) Active Problems:   Diabetes (HCC)   Hyperlipidemia associated with type 2 diabetes mellitus (HCC)   Hypertension   Penetrating atherosclerotic ulcer of aorta (HCC)  Diagnostic Studies/Procedures    N/a  _____________   History of Present Illness     Shaun Cummings is a 47 y.o. male with past medical history of hypertension, hyperlipidemia, tobacco use, family history of premature CAD who was admitted back 12/2021 and found to have an elevated troponin of 7000.  He also underwent a CTA chest abdomen pelvis with a mural thrombus involving a penetrating ulcer in the infrarenal abdominal aorta.  Vascular surgery was consulted and recommended conservative management.  He was found to have an LVEF of 35 to 40% and underwent cardiac catheterization which showed three-vessel CAD.  He underwent high risk PCI to the mid LAD which was successful.  Placed on DAPT.  He was seen again in the [facility] heart failure clinic where GDMT was further titrated.  He was last seen in the advanced heart failure clinic on 01/2022 and reported doing well.  Presented to the ED on 3/12 with complaints of abdominal pain radiating up into his chest.  Initial troponins were negative and EKG without ischemic changes but given his symptoms and known CAD he was admitted for further evaluation.  Hospital Course     Abdominal pain Chest pain CAD, multivessel disease s/p PCI of RCA -- presented with abd pain which radiated up in to the lower part of his chest which was similar to what he experienced back 12/2021 with prior PCI. hsTn negative. Currently pain free. Given known CAD and recurrent symptoms discussed proceeding with  nuclear stress to assess for ischemia in LAD and Lcx territories.  This was attempted to be done as an inpatient but unfortunately they were unable to accommodate.  Given his negative enzymes and no recurrence of chest pain.  He will be discharged with plans for outpatient nuclear stress test. -- continue ASA, plavix, statin, Zetia, Coreg. Add Imdur 30mg  daily    HFrEF ICM -- known LVEF of 35-40%, does not appear volume overloaded on exam -- GDMT: home meds of coreg, entresto, jardiance, spiro    HTN -- elevated in the ED but had not received meds -- continue coreg, entresto  HLD -- continue statin and Zetia   CKD stage III -- Cr 2 on admission -- follow BMET as an outpatient -- planned for outpatient nephrology follow up   DM -- resume home Jardiance    Aortic penetrating ulcer -- follows with [facility] outpatient, last visit was 01/2022 with rec's for follow up in 6 months    Patient seen by Dr. Herbie Baltimore and deemed stable for discharge home. Follow up in the office arranged. Medications sent to pharmacy of choice. Discussed with the patient regarding stress test scheduled for 3/15.   _____________  Discharge Vitals Blood pressure 121/68, pulse 72, temperature 98.2 F (36.8 C), resp. rate 20, height 5' 10.5" (1.791 m), weight 104.3 kg, SpO2 99 %.  Filed Weights   04/08/22 0035  Weight: 104.3 kg    Labs & Radiologic Studies    CBC Recent Labs    04/08/22 0045  WBC 10.7*  HGB 10.6*  HCT 31.2*  MCV 86.7  PLT 309   Basic Metabolic Panel Recent Labs    45/40/98 0045  NA 138  K 4.2  CL 107  CO2 18*  GLUCOSE 165*  BUN 34*  CREATININE 2.00*  CALCIUM 8.9   Liver Function Tests No results for input(s): "AST", "ALT", "ALKPHOS", "BILITOT", "PROT", "ALBUMIN" in the last 72 hours. No results for input(s): "LIPASE", "AMYLASE" in the last 72 hours. High Sensitivity Troponin:   Recent Labs  Lab 04/08/22 0045 04/08/22 0219  TROPONINIHS 11 12    BNP Invalid input(s):  "POCBNP" D-Dimer No results for input(s): "DDIMER" in the last 72 hours. Hemoglobin A1C No results for input(s): "HGBA1C" in the last 72 hours. Fasting Lipid Panel No results for input(s): "CHOL", "HDL", "LDLCALC", "TRIG", "CHOLHDL", "LDLDIRECT" in the last 72 hours. Thyroid Function Tests No results for input(s): "TSH", "T4TOTAL", "T3FREE", "THYROIDAB" in the last 72 hours.  Invalid input(s): "FREET3" _____________  DG Chest 2 View  Result Date: 04/08/2022 CLINICAL DATA:  Chest pain, fatigue, and fever EXAM: CHEST - 2 VIEW COMPARISON:  01/04/2022 FINDINGS: Stable cardiomediastinal silhouette. No focal consolidation, pleural effusion, or pneumothorax. No acute osseous abnormality. Mild wedging of a lower thoracic vertebral body is unchanged. IMPRESSION: No active cardiopulmonary disease. Electronically Signed   By: Minerva Fester M.D.   On: 04/08/2022 01:02   Disposition   Pt is being discharged home today in good condition.  Follow-up Plans & Appointments     Follow-up Information     [facility] Follow up on 04/10/2022.   Specialty: Cardiology Why: Please nothing to eat or drink after midnight in prep for your stress test. Please arrive at the office by 10:20am to check in for appt at 10:45 Contact information: [address] [id number] mc [place] [address] [phone]        Meriam Sprague, MD Follow up on 05/14/2022.   Specialties: Cardiology, Radiology Why: at 10:20am for your follow up appt. Contact information: [address] [place] [address] [phone]                Discharge Instructions     Call MD for:  difficulty breathing, headache or visual disturbances   Complete by: As directed    Call MD for:  persistant dizziness or light-headedness   Complete by: As directed    Call MD for:  redness, tenderness, or signs of infection (pain, swelling, redness, odor or  green/yellow discharge around incision site)   Complete by: As directed    Diet - low sodium heart healthy   Complete by: As directed    Increase activity slowly   Complete by: As directed         Discharge Medications   Allergies as of 04/08/2022       Reactions   Augmentin [amoxicillin-pot Clavulanate] Hives, Itching   Has patient had a PCN reaction causing immediate rash, facial/tongue/throat swelling, SOB or lightheadedness with hypotension: Yes Has patient had a PCN reaction causing severe rash involving mucus membranes or skin necrosis: No Has patient had a PCN reaction that required hospitalization: No Has patient had a PCN reaction occurring within the last 10 years: No If all of the above answers are "NO", then may proceed with Cephalosporin use.   Contrast Media [iodinated Contrast Media] Hives   Pt broke out with 1 hive on forehead after CT injection-treated with 25mg  Benadryl  Medication List     TAKE these medications    aspirin EC 81 MG tablet Take 1 tablet (81 mg total) by mouth daily. Swallow whole. Start taking on: April 09, 2022   atorvastatin 80 MG tablet Commonly known as: LIPITOR Take 1 tablet by mouth once daily   blood glucose meter kit and supplies Dispense based on patient and insurance preference. Use up to four times daily as directed. (FOR ICD-10 E10.9, E11.9).   carvedilol 25 MG tablet Commonly known as: COREG Take 1 tablet (25 mg total) by mouth 2 (two) times daily with a meal.   clopidogrel 75 MG tablet Commonly known as: PLAVIX Take 1 tablet (75 mg total) by mouth daily.   empagliflozin 10 MG Tabs tablet Commonly known as: Jardiance Take 1 tablet (10 mg total) by mouth daily before breakfast.   Entresto 49-51 MG Generic drug: sacubitril-valsartan Take 1 tablet by mouth 2 (two) times daily.   ezetimibe 10 MG tablet Commonly known as: Zetia Take 1 tablet (10 mg total) by mouth daily.   glipiZIDE 5 MG tablet Commonly  known as: GLUCOTROL Take 1 tablet (5 mg total) by mouth daily before breakfast.   isosorbide mononitrate 30 MG 24 hr tablet Commonly known as: IMDUR Take 1 tablet (30 mg total) by mouth daily.   nitroGLYCERIN 0.4 MG SL tablet Commonly known as: Nitrostat Place 1 tablet (0.4 mg total) under the tongue every 5 (five) minutes as needed for chest pain.           Outstanding Labs/Studies   N/a   Duration of Discharge Encounter   Greater than 30 minutes including physician time.  Signed, Laverda Page, NP 04/08/2022, 4:08 PM   ATTENDING ATTESTATION  I have seen, examined and evaluated the patient this afternoon after he found out that he was not going to be having his stress test performed today.  The initial plan was discussed with Laverda Page, NP and I agree with her initial plan from the progress note. After reviewing all the available data and chart, we discussed the patients laboratory, study & physical findings as well as symptoms in detail.  I agree with her findings, examination as well as impression recommendations as per our discussion.    Attending adjustments noted in italics.   Chest pain-free now with no pain since arrival to the ER.  Troponins were negative and EKG was unremarkable.  Not unreasonable to assess for ischemia based on the fact that he has multivessel disease, but this can be done in the outpatient setting.  Will arrange outpatient stress test with close follow-up.  Continue current medication but add Imdur.    Marykay Lex, MD, MS Bryan Lemma, M.D., M.S. Interventional Cardiologist  [facility]  Pager # [phone] Phone # [phone] [address] [place], [place] [address]

## 2022-04-09 ENCOUNTER — Telehealth (HOSPITAL_COMMUNITY): Payer: Self-pay | Admitting: Radiology

## 2022-04-09 NOTE — Telephone Encounter (Signed)
Patient given detailed instructions per Myocardial Perfusion Study Information Sheet for the test on 04/10/2022 at 10:45. Patient notified to arrive 15 minutes early and that it is imperative to arrive on time for appointment to keep from having the test rescheduled.  If you need to cancel or reschedule your appointment, please call the office within 24 hours of your appointment. . Patient verbalized understanding.EHK

## 2022-04-10 ENCOUNTER — Ambulatory Visit (HOSPITAL_COMMUNITY): Payer: 59

## 2022-04-13 ENCOUNTER — Telehealth (HOSPITAL_COMMUNITY): Payer: Self-pay

## 2022-04-13 NOTE — Telephone Encounter (Signed)
No response from pt.  Closed referral  

## 2022-04-20 ENCOUNTER — Encounter: Payer: Self-pay | Admitting: Cardiology

## 2022-04-20 ENCOUNTER — Ambulatory Visit (HOSPITAL_COMMUNITY): Payer: 59 | Attending: Cardiology

## 2022-04-20 DIAGNOSIS — I200 Unstable angina: Secondary | ICD-10-CM | POA: Diagnosis not present

## 2022-04-20 LAB — MYOCARDIAL PERFUSION IMAGING
LV dias vol: 147 mL (ref 62–150)
LV sys vol: 83 mL
Nuc Stress EF: 43 %
Peak HR: 83 {beats}/min
Rest HR: 65 {beats}/min
Rest Nuclear Isotope Dose: 10.1 mCi
SDS: 6
SRS: 4
SSS: 10
ST Depression (mm): 0 mm
Stress Nuclear Isotope Dose: 31.2 mCi
TID: 1.08

## 2022-04-20 MED ORDER — TECHNETIUM TC 99M TETROFOSMIN IV KIT: 31.2000 | PACK | Freq: Once | INTRAVENOUS | Status: DC | PRN

## 2022-04-20 MED ORDER — REGADENOSON 0.4 MG/5ML IV SOLN: 0.4000 mg | Freq: Once | INTRAVENOUS | Status: DC

## 2022-04-20 MED ORDER — TECHNETIUM TC 99M TETROFOSMIN IV KIT
10.1000 | PACK | Freq: Once | INTRAVENOUS | Status: AC | PRN
  Administered 2022-04-20: 10.1 via INTRAVENOUS

## 2022-04-20 NOTE — Telephone Encounter (Signed)
Error

## 2022-05-12 NOTE — Progress Notes (Unsigned)
Cardiology Office Note:    Date:  05/14/2022   ID:  Shaun Cummings, DOB Aug 10, 1975, MRN [id number]  PCP:  Quincy Simmonds, MD   [facility] Providers Cardiologist:  Meriam Sprague, MD {   Referring MD: Quincy Simmonds, MD    History of Present Illness:    Shaun Cummings is a 47 y.o. male with a hx of HLD, DMII with peripheral neuropathy, and OSA who presents to clinic for follow-up.  Was initially seen in 06/2020 for chest pain. Did report family history of premature CAD with mother having PCI in her 68s and father having MI in his 68s.  Chest pain was atypical but was risk stratified with a calcium score noted at 42 which was 92nd percentile compared to gender and race.  He was started on Crestor 10 mg daily as well as valsartan 80 mg daily.   He presented to the ED on 01/04/22 with complaints of acute abdominal pain in the epigastric region radiating to his back with concerns for acute aortic syndrome. Trop  19>> 22>>1719>>4399>>7294, WBC 17.4, hemoglobin 13.5>>12.6.  Blood pressure over 200 systolic.  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  Dr. Randie Heinz evaluated and was recommended for BP management. Was admitted to PCCM, still on esmolol and Cleviprex drip.  Also started on oral hydralazine.  Echo 12/11 with LVEF of 35 to 40%, grade 1 diastolic dysfunction, global hypokinesis but also severe focal hypokinesis of the basal to mid inferior wall, moderately dilated left atrium, mildly dilated right atrium, no significant valvular disease.  LHC showed multivessel CAD and he was turned down for CABG. He ultimately underwent PCI to RCA.  Was last seen in HF clinic on 01/2022 where he was stable from a CV standpoint.  Today, the patient overall feels well. No chest pain, SOB, orthopnea, LE edema, or PND. Has mild dizziness  with standing but this resolves quickly. No syncope. He walks the mall and denies any exertional symptoms. Tolerating her medications as prescribed. Has been working on [employer] and exercise as well.  Quit smoking!  Past Medical History:  Diagnosis Date   Abdominal pain 01/07/2022   Angina pectoris 12/17/2021   Blurred vision 02/04/2021   Chest pain    Elevated troponin 01/04/2022   HTN (hypertension)    Hyperlipidemia    Hypertensive emergency 01/04/2022   Infected epithelial inclusion cyst 03/14/2018   Leg edema, right 12/31/2021   Lesion of penis    foreskin   OSA (obstructive sleep apnea)    per pt dx osa and used cpap but after losing wt. from 415 pounds down to 244 pounds no longer needs cpap   Peripheral neuropathy    Peripheral neuropathy 05/21/2017   Phimosis    Scrotal abscess 05/08/2017   Type 2 diabetes mellitus    per pt no meds for two years pt was changed to levemir unable to afford but pt states is waiting on approval for a program he applied for that will pay for his meds [facility])      Past Surgical History:  Procedure Laterality Date   CIRCUMCISION N/A 02/10/2016   Procedure: CIRCUMCISION ADULT;  Surgeon: Malen Gauze, MD;  Location: [facility];  Service: Urology;  Laterality: N/A;   CORONARY STENT INTERVENTION N/A 01/14/2022   Procedure: CORONARY STENT INTERVENTION;  Surgeon: Tonny Bollman, MD;  Location: [facility] INVASIVE CV LAB;  Service: Cardiovascular;  Laterality: N/A;   INCISION AND DRAINAGE ABSCESS Right 05/12/2017   Procedure: INCISION AND DRAINAGE RIGHT INGUINAL ABSCESS;  Surgeon: Harriette Bouillon, MD;  Location: [facility] OR;  Service: General;  Laterality: Right;   INCISION AND DRAINAGE ABSCESS N/A 03/24/2018   Procedure: INCISION AND DRAINAGE POSTERIOR NECK ABSCESS;  Surgeon: Glenna Fellows, MD;  Location: [facility] OR;  Service: General;  Laterality: N/A;   INCISION AND DRAINAGE ABSCESS Right 06/19/2020   Procedure:  INCISION AND DRAINAGE ABSCESS, RIGHT GROIN;  Surgeon: Luretha Murphy, MD;  Location: [facility] ORS;  Service: General;  Laterality: Right;   INCISION AND DRAINAGE PERIRECTAL ABSCESS Right 05/09/2017   Procedure: IRRIGATION AND DEBRIDEMENT PERINEAL ABSCESS;  Surgeon: Manus Rudd, MD;  Location: [facility] OR;  Service: General;  Laterality: Right;   LEFT HEART CATH AND CORONARY ANGIOGRAPHY N/A 01/06/2022   Procedure: LEFT HEART CATH AND CORONARY ANGIOGRAPHY;  Surgeon: Lennette Bihari, MD;  Location: [facility] IN[facility]AS[facility] CV [facility];  Service: Cardiovascular;  Laterality: N/A;    Current Medications: Current Meds  Medication Sig   aspirin EC 81 MG tablet Take 1 tablet (81 mg total) by mouth daily. Swallow whole.   blood glucose meter kit and supplies Dispense based on patient and insurance preference. Use up to four times daily as directed. (FOR ICD-10 E10.9, E11.9).   ezetimibe (ZETIA) 10 MG tablet Take 1 tablet (10 mg total) by mouth daily.   glipiZIDE (GLUCOTROL) 5 MG tablet Take 1 tablet (5 mg total) by mouth daily before breakfast.   [DISCONTINUED] atorvastatin (LIPITOR) 80 MG tablet Take 1 tablet by mouth once daily   [DISCONTINUED] carvedilol (COREG) 25 MG tablet TAKE 1 TABLET BY MOUTH TWICE DAILY WITH A MEAL   [DISCONTINUED] clopidogrel (PLAVIX) 75 MG tablet Take 1 tablet (75 mg total) by mouth daily.   [DISCONTINUED] empagliflozin (JARDIANCE) 10 MG TABS tablet Take 1 tablet (10 mg total) by mouth daily before breakfast.   [DISCONTINUED] sacubitril-valsartan (ENTRESTO) 49-51 MG Take 1 tablet by mouth 2 (two) times daily.     Allergies:   Augmentin [amoxicillin-pot clavulanate] and Contrast media [iodinated contrast media]   Social History   Socioeconomic History   Marital status: Married    Spouse name: Sheran Luz   Number of children: 2   Years of education: college    Highest education level: Not on file  Occupational History   Occupation: [employer]   Tobacco Use   Smoking status: Former    Years: 2     Types: Cigarettes    Quit date: 02/05/2008    Years since quitting: 14.2   Smokeless tobacco: Never  Vaping Use   Vaping Use: Never used  Substance and Sexual Activity   Alcohol use: No   Drug use: Not Currently    Types: Marijuana    Comment: quit 1 month ago.   Sexual activity: Not on file  Other Topics Concern   Not on file  Social History Narrative   Lives with wife and 2 kids, age 58 and 73.    Social Determinants of Health   Financial Resource Strain: Low Risk  (03/05/2022)   Overall Financial Resource Strain (CARDIA)    Difficulty of Paying Living Expenses: Not very hard  Food Insecurity: Food Insecurity Present (03/05/2022)   Hunger Vital Sign    Worried About Running Out of Food in the Last Year: Sometimes true    Ran Out of Food in the Last Year: Sometimes true  Transportation  Needs: No Transportation Needs (03/05/2022)   PRAPARE - Administrator, Civil Service (Medical): No    Lack of Transportation (Non-Medical): No  Physical Activity: Inactive (03/05/2022)   Exercise Vital Sign    Days of Exercise per Week: 0 days    Minutes of Exercise per Session: 0 min  Stress: No Stress Concern Present (03/05/2022)   [employer] of Occupational Health - Occupational Stress Questionnaire    Feeling of Stress : Not at all  Social Connections: Moderately Integrated (03/05/2022)   Social Connection and Isolation Panel [NHANES]    Frequency of Communication with Friends and Family: More than three times a week    Frequency of Social Gatherings with Friends and Family: Three times a week    Attends Religious Services: 1 to 4 times per year    Active Member of Clubs or Organizations: No    Attends Engineer, structural: Never    Marital Status: Married     Family History: The patient's family history includes Angina in his mother; Asthma in his son; CAD in his mother; Diabetes in his brother, father, maternal grandfather, maternal grandmother, mother, paternal  grandfather, and paternal grandmother; Glaucoma in his father, maternal grandfather, maternal grandmother, paternal grandfather, and paternal grandmother; Heart attack in his father; Heart disease in his maternal grandfather, maternal grandmother, and mother; Kidney failure in his father.  ROS:   Please see the history of present illness.    Review of Systems  Constitutional:  Negative for chills and fever.  HENT:  Negative for hearing loss.   Eyes:  Negative for blurred vision.  Respiratory:  Negative for shortness of breath.   Cardiovascular:  Negative for chest pain, palpitations, orthopnea, claudication, leg swelling and PND.  Gastrointestinal:  Negative for nausea and vomiting.  Genitourinary:  Negative for dysuria.  Neurological:  Negative for dizziness and loss of consciousness.  Psychiatric/Behavioral:  Negative for substance abuse.     EKGs/Labs/Other Studies Reviewed:    The following studies were reviewed today: Cath: 01/06/2022     Dist LAD lesion is 50% stenosed.   Prox LAD lesion is 70% stenosed.   Ost RCA to Prox RCA lesion is 75% stenosed.   Prox RCA lesion is 75% stenosed.   Prox RCA to Mid RCA lesion is 95% stenosed.   Mid RCA lesion is 50% stenosed.   1st Mrg lesion is 50% stenosed.   Mid Cx-2 lesion is 70% stenosed.   2nd Mrg lesion is 95% stenosed.   Mid Cx-1 lesion is 50% stenosed.   Severe multivessel CAD with the dominant RCA being the "culprit" vessel contributing to the non-STEMI with 70% diffuse proximal stenosis followed by 70% and 95% thrombotic mid stenosis and 50% distal stenosis.   There is 70% diffuse proximal LAD stenosis with diffuse 50% distal LAD stenosis.   The left circumflex vessel has diffuse 50% stenosis in the small OM 2 vessel with 95% focal stenosis in the OM 3 vessel and bifurcation AV groove 50% stenosis followed by 70% distal stenosis.   LVEDP elevated at 27 mm.   RECOMMENDATION: Surgical consultation will be recommended for  CABG revascularization.  Will resume heparin 8 hours post sheath discontinuance.  Initiate medical therapy for significant CAD and aggressive lipid-lowering therapy with target LDL less than 55. Smoking cessation is essential.   Diagnostic Dominance: Right  Echo: 01/05/2022   IMPRESSIONS     1. Left ventricular ejection fraction, by estimation, is 35 to 40%. The  left ventricle has moderately decreased function. The left ventricle  demonstrates regional wall motion abnormalities (see scoring  diagram/findings for description). The left  ventricular internal cavity size was mildly dilated. Left ventricular  diastolic parameters are consistent with Grade I diastolic dysfunction  (impaired relaxation). There is hypokinesis of the left ventricular,  septal wall, anterior wall and lateral wall.   There is severe hypokinesis of the left ventricular, basal-mid inferior  wall.   2. Right ventricular systolic function is normal. The right ventricular  size is normal.   3. Left atrial size was moderately dilated.   4. Right atrial size was mildly dilated.   5. The mitral valve is normal in structure. Trivial mitral valve  regurgitation. No evidence of mitral stenosis.   6. The aortic valve is grossly normal. Aortic valve regurgitation is not  visualized. No aortic stenosis is present.   7. The inferior vena cava is normal in size with greater than 50%  respiratory variability, suggesting right atrial pressure of 3 mmHg.   Comparison(s): No prior Echocardiogram.   Conclusion(s)/Recommendation(s): Reduced LVEF, with global hypokinesis but  also severe focal hypokinesis of basal to mid inferior wall. Results will  be communicated to primary team.   FINDINGS   Left Ventricle: Left ventricular ejection fraction, by estimation, is 35  to 40%. The left ventricle has moderately decreased function. The left  ventricle demonstrates regional wall motion abnormalities. Severe  hypokinesis of the  left ventricular,  basal-mid inferior wall. The left ventricular internal cavity size was  mildly dilated. There is borderline left ventricular hypertrophy. Left  ventricular diastolic parameters are consistent with Grade I diastolic  dysfunction (impaired relaxation).   Right Ventricle: The right ventricular size is normal. No increase in  right ventricular wall thickness. Right ventricular systolic function is  normal.   Left Atrium: Left atrial size was moderately dilated.   Right Atrium: Right atrial size was mildly dilated.   Pericardium: There is no evidence of pericardial effusion.   Mitral Valve: The mitral valve is normal in structure. Trivial mitral  valve regurgitation. No evidence of mitral valve stenosis.   Tricuspid Valve: The tricuspid valve is normal in structure. Tricuspid  valve regurgitation is trivial. No evidence of tricuspid stenosis.   Aortic Valve: The aortic valve is grossly normal. Aortic valve  regurgitation is not visualized. No aortic stenosis is present.   Pulmonic Valve: The pulmonic valve was grossly normal. Pulmonic valve  regurgitation is not visualized. No evidence of pulmonic stenosis.   Aorta: The aortic root, ascending aorta and aortic arch are all  structurally normal, with no evidence of dilitation or obstruction.   Venous: The inferior vena cava is normal in size with greater than 50%  respiratory variability, suggesting right atrial pressure of 3 mmHg.   IAS/Shunts: The atrial septum is grossly normal.    Cath: 01/14/2022   Successful PCI of severe stenosis in the mid RCA with a 3.5 x 24 mm Synergy DES, reducing 95% ulcerated plaque to 0% post PCI, TIMI-3 flow pre and post.   Recommendations: Aggressive medical therapy for residual CAD, DAPT with aspirin and clopidogrel x 12 months without interruption.  Follow creatinine closely to evaluate for AKI.  Total contrast 40 cc used for this procedure.   Diagnostic Dominance:  Right  Intervention       EKG:  No new tracing today  Recent Labs: 01/15/2022: Magnesium 1.9 01/30/2022: ALT 24; B Natriuretic Peptide 433.9 04/08/2022: BUN 34; Creatinine, Ser 2.00; Hemoglobin 10.6;  Platelets 309; Potassium 4.2; Sodium 138  Recent Lipid Panel    Component Value Date/Time   CHOL 138 03/05/2022 1442   TRIG 150 (H) 03/05/2022 1442   HDL 27 (L) 03/05/2022 1442   CHOLHDL 5.1 (H) 03/05/2022 1442   CHOLHDL 7.7 09/15/2013 1727   VLDL NOT CALC 09/15/2013 1727   LDLCALC 84 03/05/2022 1442      Physical Exam:    VS:  BP 126/80   Pulse 70   Ht  (1.803 m)   Wt 241 lb 6.4 oz (109.5 kg)   SpO2 97%   BMI 33.67 kg/m     Wt Readings from Last 3 Encounters:  05/14/22 241 lb 6.4 oz (109.5 kg)  04/08/22 230 lb (104.3 kg)  03/18/22 231 lb 6.4 oz (105 kg)     GEN:  Well nourished, well developed in no acute distress HEENT: Normal NECK: No JVD; No carotid bruits CARDIAC: RRR, no murmurs RESPIRATORY:  Clear to auscultation without rales, wheezing or rhonchi  ABDOMEN: Soft, non-tender, non-distended MUSCULOSKELETAL:  No edema; No deformity  SKIN: Warm and dry NEUROLOGIC:  Alert and oriented x 3 PSYCHIATRIC:  Normal affect   ASSESSMENT:    1. Chronic systolic congestive heart failure   2. Ischemic cardiomyopathy   3. Primary hypertension   4. Penetrating atherosclerotic ulcer of aorta   5. Coronary artery disease involving native coronary artery of native heart without angina pectoris   6. Mixed hyperlipidemia   7. Tobacco use     PLAN:    In order of problems listed above:  #Chronic Systolic HF: #Ischemic Cardiomyopathy: -LVEF 35-40%, global hypokinesis with severe HK in basal-inferior wall -Currently, doing well and compensated on exam with NYHA class II symptoms -Continue coreg 12.5mg  BID -Continue entresto 49/51mg  BID -Continue spironolactone 12.5mg  daily -Continue jardiance  daily -Follow-up with HF team as scheduled  #Multivessel  CAD: -Turned down for CABG due to significant comorbid conditions -Now s/p PCI to RCA  -Myoview 03/2022 with inferior scar but no ischemia -Doing well without anginal symptoms -Continue ASA  dail, plavix  daily -Continue coreg  BID -Did not tolerate imdur due to dizziness   #Penetrating abd aortic ulcer -Mural thrombus of abdominal aorta with small penetrating ulcer in the infrarenal abdominal aorta measuring 3 mm with no mural hematoma.  -Managed conservatively and follows with vascular surgery   #HTN -Continue coreg 12.5mg  BID -Continue entresto 49/51mg  BID -Continue spironolactone 12.5mg  daily  #DMII: -A1C 9.9 -Continue jardiance  daily -Continue lifestyle modifications -Planned for repeat A1C with PCP   #CKD: -Monitor renal function   #Tobacco use - Quit successfully        Medication Adjustments/Labs and Tests Ordered: Current medicines are reviewed at length with the patient today.  Concerns regarding medicines are outlined above.  No orders of the defined types were placed in this encounter.  Meds ordered this encounter  Medications   ezetimibe (ZETIA) 10 MG tablet    Sig: Take 1 tablet (10 mg total) by mouth daily.    Dispense:  90 tablet    Refill:  2   sacubitril-valsartan (ENTRESTO) 49-51 MG    Sig: Take 1 tablet by mouth 2 (two) times daily.    Dispense:  180 tablet    Refill:  3   empagliflozin (JARDIANCE) 10 MG TABS tablet    Sig: Take 1 tablet (10 mg total) by mouth daily before breakfast.    Dispense:  90 tablet    Refill:  3  clopidogrel (PLAVIX) 75 MG tablet    Sig: Take 1 tablet (75 mg total) by mouth daily.    Dispense:  90 tablet    Refill:  2   carvedilol (COREG) 25 MG tablet    Sig: Take 1 tablet (25 mg total) by mouth 2 (two) times daily with a meal.    Dispense:  180 tablet    Refill:  2   atorvastatin (LIPITOR) 80 MG tablet    Sig: Take 1 tablet (80 mg total) by mouth daily.    Dispense:  90 tablet    Refill:  3     Patient Instructions  Medication Instructions:   Your physician recommends that you continue on your current medications as directed. Please refer to the Current Medication list given to you today.  *If you need a refill on your cardiac medications before your next appointment, please call your pharmacy*    Follow-Up: At [facility], you and your health needs are our priority.  As part of our continuing mission to provide you with exceptional heart care, we have created designated Provider Care Teams.  These Care Teams include your primary Cardiologist (physician) and Advanced Practice Providers (APPs -  Physician Assistants and Nurse Practitioners) who all work together to provide you with the care you need, when you need it.  We recommend signing up for the patient portal called "MyChart".  Sign up information is provided on this After Visit Summary.  MyChart is used to connect with patients for Virtual Visits (Telemedicine).  Patients are able to view lab/test results, encounter notes, upcoming appointments, etc.  Non-urgent messages can be sent to your provider as well.   To learn more about what you can do with MyChart, go to [email or web address].    Your next appointment:   6 month(s)  Provider:   Meriam Sprague, MD         Signed, Meriam Sprague, MD  05/14/2022 10:52 AM    [facility]

## 2022-05-13 ENCOUNTER — Other Ambulatory Visit (HOSPITAL_COMMUNITY): Payer: Self-pay | Admitting: Cardiology

## 2022-05-14 ENCOUNTER — Encounter: Payer: Self-pay | Admitting: Cardiology

## 2022-05-14 ENCOUNTER — Ambulatory Visit: Payer: 59 | Attending: Cardiology | Admitting: Cardiology

## 2022-05-14 VITALS — BP 126/80 | HR 70 | Ht 71.0 in | Wt 241.4 lb

## 2022-05-14 DIAGNOSIS — I2510 Atherosclerotic heart disease of native coronary artery without angina pectoris: Secondary | ICD-10-CM | POA: Diagnosis not present

## 2022-05-14 DIAGNOSIS — I10 Essential (primary) hypertension: Secondary | ICD-10-CM | POA: Diagnosis not present

## 2022-05-14 DIAGNOSIS — E782 Mixed hyperlipidemia: Secondary | ICD-10-CM | POA: Diagnosis not present

## 2022-05-14 DIAGNOSIS — I5022 Chronic systolic (congestive) heart failure: Secondary | ICD-10-CM

## 2022-05-14 DIAGNOSIS — Z720 Tobacco use: Secondary | ICD-10-CM

## 2022-05-14 DIAGNOSIS — I255 Ischemic cardiomyopathy: Secondary | ICD-10-CM | POA: Diagnosis not present

## 2022-05-14 DIAGNOSIS — I719 Aortic aneurysm of unspecified site, without rupture: Secondary | ICD-10-CM | POA: Diagnosis not present

## 2022-05-14 MED ORDER — ATORVASTATIN CALCIUM 80 MG PO TABS: 80.0000 mg | ORAL_TABLET | Freq: Every day | ORAL | 3 refills | Status: DC

## 2022-05-14 MED ORDER — CARVEDILOL 25 MG PO TABS: 25.0000 mg | ORAL_TABLET | Freq: Two times a day (BID) | ORAL | 2 refills | Status: DC

## 2022-05-14 MED ORDER — EZETIMIBE 10 MG PO TABS: 10.0000 mg | ORAL_TABLET | Freq: Every day | ORAL | 2 refills | Status: DC

## 2022-05-14 MED ORDER — EMPAGLIFLOZIN 10 MG PO TABS: 10.0000 mg | ORAL_TABLET | Freq: Every day | ORAL | 3 refills | Status: DC

## 2022-05-14 MED ORDER — ENTRESTO 49-51 MG PO TABS: 1.0000 | ORAL_TABLET | Freq: Two times a day (BID) | ORAL | 3 refills | Status: DC

## 2022-05-14 MED ORDER — CLOPIDOGREL BISULFATE 75 MG PO TABS: 75.0000 mg | ORAL_TABLET | Freq: Every day | ORAL | 2 refills | Status: DC

## 2022-05-14 NOTE — Patient Instructions (Signed)
Medication Instructions:   Your physician recommends that you continue on your current medications as directed. Please refer to the Current Medication list given to you today.  *If you need a refill on your cardiac medications before your next appointment, please call your pharmacy*    Follow-Up: At [facility], you and your health needs are our priority.  As part of our continuing mission to provide you with exceptional heart care, we have created designated Provider Care Teams.  These Care Teams include your primary Cardiologist (physician) and Advanced Practice Providers (APPs -  Physician Assistants and Nurse Practitioners) who all work together to provide you with the care you need, when you need it.  We recommend signing up for the patient portal called "MyChart".  Sign up information is provided on this After Visit Summary.  MyChart is used to connect with patients for Virtual Visits (Telemedicine).  Patients are able to view lab/test results, encounter notes, upcoming appointments, etc.  Non-urgent messages can be sent to your provider as well.   To learn more about what you can do with MyChart, go to [email or web address].    Your next appointment:   6 month(s)  Provider:   Heather E Pemberton, MD       

## 2022-06-07 ENCOUNTER — Other Ambulatory Visit: Payer: Self-pay | Admitting: Student

## 2022-06-07 DIAGNOSIS — E1139 Type 2 diabetes mellitus with other diabetic ophthalmic complication: Secondary | ICD-10-CM

## 2022-06-08 ENCOUNTER — Other Ambulatory Visit: Payer: Self-pay

## 2022-06-08 DIAGNOSIS — E1139 Type 2 diabetes mellitus with other diabetic ophthalmic complication: Secondary | ICD-10-CM

## 2022-06-08 MED ORDER — GLIPIZIDE 5 MG PO TABS: 5.0000 mg | ORAL_TABLET | Freq: Every day | ORAL | 11 refills | Status: DC

## 2022-06-08 NOTE — Telephone Encounter (Signed)
Patient sent message via my chart to contact our office to schedule an appointment. 

## 2022-06-25 ENCOUNTER — Encounter: Payer: Self-pay | Admitting: *Deleted

## 2022-08-25 NOTE — Progress Notes (Signed)
Office Note   History of Present Illness   Shaun Cummings is a 47 y.o. (1975-08-26) male who presents for follow-up.  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  He was last seen by our office in January without abdominal pain.  He has not had a repeat CT due to issues with elevated creatinine.  The patient returns today for follow up.  He denies any recent medical history changes.  He endorses good control of his blood glucose and blood pressures.  He denies any issues with abdominal pain.  He has not had lab work since March this year, but his creatinine was still elevated at 2 at that time.  He denies any issues with claudication, rest pain, tissue loss of the lower extremities.  Current Outpatient Medications  Medication Sig Dispense Refill   aspirin EC 81 MG tablet Take 1 tablet (81 mg total) by mouth daily. Swallow whole. 30 tablet 12   atorvastatin (LIPITOR) 80 MG tablet Take 1 tablet (80 mg total) by mouth daily. 90 tablet 3   blood glucose meter kit and supplies Dispense based on patient and insurance preference. Use up to four times daily as directed. (FOR ICD-10 E10.9, E11.9). 1 each 0   carvedilol (COREG) 25 MG tablet Take 1 tablet (25 mg total) by mouth 2 (two) times daily with a meal. 180 tablet 2   clopidogrel (PLAVIX) 75 MG tablet Take 1 tablet (75 mg total) by mouth daily. 90 tablet 2   empagliflozin (JARDIANCE) 10 MG TABS tablet Take 1 tablet (10 mg total) by mouth daily before breakfast. 90 tablet 3   ezetimibe (ZETIA) 10 MG tablet Take 1 tablet (10 mg total) by mouth daily. 90 tablet 2   glipiZIDE (GLUCOTROL) 5 MG tablet Take 1 tablet (5 mg total) by mouth daily before breakfast. 30 tablet 11   nitroGLYCERIN (NITROSTAT) 0.4 MG SL tablet Place 1 tablet (0.4 mg total) under the tongue every 5 (five) minutes as needed for chest pain. 90 tablet 3   sacubitril-valsartan (ENTRESTO) 49-51 MG Take 1 tablet by mouth 2 (two) times  daily. 180 tablet 3   No current facility-administered medications for this visit.   Facility-Administered Medications Ordered in Other Visits  Medication Dose Route Frequency Provider Last Rate Last Admin   regadenoson (LEXISCAN) injection SOLN 0.4 mg  0.4 mg Intravenous Once Lewayne Bunting, MD       technetium tetrofosmin (TC-MYOVIEW) injection 31.2 millicurie  31.2 millicurie Intravenous Once PRN Lewayne Bunting, MD       REVIEW OF SYSTEMS (negative unless checked):   Cardiac:  []  Chest pain or chest pressure? []  Shortness of breath upon activity? []  Shortness of breath when lying flat? []  Irregular heart rhythm?  Vascular:  []  Pain in calf, thigh, or hip brought on by walking? []  Pain in feet at night that wakes you up from your sleep? []  Blood clot in your veins? []  Leg swelling?  Pulmonary:  []  Oxygen at home? []  Productive cough? []  Wheezing?  Neurologic:  []  Sudden weakness in arms or legs? []  Sudden numbness in arms or legs? []  Sudden onset of difficult speaking or slurred speech? []  Temporary loss of vision in one eye? []  Problems with dizziness?  Gastrointestinal:  []  Blood in stool? []  Vomited blood?  Genitourinary:  []  Burning when urinating? []  Blood in urine?  Psychiatric:  []  Major depression  Hematologic:  []  Bleeding problems? []  Problems with  blood clotting?  Dermatologic:  []  Rashes or ulcers?  Constitutional:  []  Fever or chills?  Ear/Nose/Throat:  []  Change in hearing? []  Nose bleeds? []  Sore throat?  Musculoskeletal:  []  Back pain? []  Joint pain? []  Muscle pain?   Physical Examination   Vitals:   08/26/22 1037  BP: (!) 151/92  Pulse: 64  Resp: 18  Temp: 98.1 F (36.7 C)  TempSrc: Temporal  SpO2: 97%  Weight: 252 lb 12.8 oz (114.7 kg)  Height: 5\' 11"  (1.803 m)   Body mass index is 35.26 kg/m.  General:  WDWN in NAD; vital signs documented above Gait: Not observed HENT: WNL, normocephalic Pulmonary:  normal non-labored breathing , without rales, rhonchi,  wheezing Cardiac: regular Abdomen: soft, NT, no masses Skin: without rashes Vascular Exam/Pulses: Bilateral lower extremities warm well-perfused Extremities: without ischemic changes, without gangrene , without cellulitis; without open wounds;  Musculoskeletal: no muscle wasting or atrophy  Neurologic: A&O X 3;  No focal weakness or paresthesias are detected Psychiatric:  The pt has Normal affect.  Non-Invasive Vascular imaging   Aortoiliac Duplex (08/26/2022) Less than 50% stenosis noted in the abdominal aorta and bilateral common and external iliac arteries.  Penetrating aortic ulceration not visualized.   Medical Decision Making   Shaun Cummings is a 47 y.o. male who presents for follow-up.  Based on the patient's vascular studies, his aortic stenosis is less than 50%.  He has triphasic flow throughout his abdominal aorta and bilateral iliac arteries He has a history of penetrating abdominal aortic ulceration, which has been under surveillance since 2019.  This could not be visualized on ultrasound He denies any issues with abdominal pain.  He has had good control of his blood sugars at home.  He denies any issues with claudication, rest pain, tissue loss of the lower extremities He has not been able to get a repeat CT scan due to elevated creatinine levels.  His last creatinine in March was still elevated at 2. He can follow-up with Dr. Randie Heinz in 6 months with repeat aortoiliac duplex   Loel Dubonnet PA-C [facility] Office: [phone]  Clinic MD: Edilia Bo

## 2022-08-26 ENCOUNTER — Ambulatory Visit: Payer: 59 | Admitting: Physician Assistant

## 2022-08-26 ENCOUNTER — Ambulatory Visit (HOSPITAL_COMMUNITY)
Admission: RE | Admit: 2022-08-26 | Discharge: 2022-08-26 | Disposition: A | Discharge status: Home / Self Care | Payer: 59 | Source: Ambulatory Visit | Attending: Vascular Surgery | Admitting: Vascular Surgery

## 2022-08-26 VITALS — BP 151/92 | HR 64 | Temp 98.1°F | Resp 18 | Ht 71.0 in | Wt 252.8 lb

## 2022-08-26 DIAGNOSIS — I719 Aortic aneurysm of unspecified site, without rupture: Secondary | ICD-10-CM

## 2022-08-26 DIAGNOSIS — M7989 Other specified soft tissue disorders: Secondary | ICD-10-CM | POA: Diagnosis not present

## 2022-08-28 ENCOUNTER — Other Ambulatory Visit: Payer: Self-pay

## 2022-08-28 DIAGNOSIS — I719 Aortic aneurysm of unspecified site, without rupture: Secondary | ICD-10-CM

## 2022-12-02 ENCOUNTER — Telehealth: Payer: Self-pay

## 2022-12-02 NOTE — Telephone Encounter (Signed)
Caller: Patient  Concern: Soreness on R side of sternum, radiating to spine, increases with movement.  4 days ago, pt had 10 min of lightheadedness after getting up.  Pt also c/o sharp stabbing pain in R arm when he raises it up past a certain point or lays on it.  Location: Back, sternum  Description:  began at 3 AM  Treatments: ibuprofen (OTC) and heat/ice  Consulted: McKenzi, PA  Resolution: Instructed to call back if symptoms perist/worsen or go to ED if treatments do not help  Next Appt: Keep sch appt on 02/24/23

## 2023-02-08 ENCOUNTER — Telehealth (HOSPITAL_COMMUNITY): Payer: Self-pay

## 2023-02-08 NOTE — Telephone Encounter (Signed)
 Received paperwork for FMLA for patients wife Manuelita)- unable to fill this out here at Dr. Sharol office due to patient not being seen since last year 02/24/22- patient is back to work- per letter tab patient went back to work Feb 2024  Spoke with patients wife and advised that this would need to be filled out by PCP or provider that has most recently seen patient- did confirm patient is back at work.   Advised her that these forms will be left at the front desk for pick up- she verbalized understanding.

## 2023-02-24 ENCOUNTER — Encounter (HOSPITAL_COMMUNITY): Payer: 59

## 2023-02-24 ENCOUNTER — Ambulatory Visit: Payer: 59 | Admitting: Vascular Surgery

## 2023-03-05 ENCOUNTER — Encounter: Payer: Self-pay | Admitting: Student

## 2023-03-05 ENCOUNTER — Ambulatory Visit (INDEPENDENT_AMBULATORY_CARE_PROVIDER_SITE_OTHER): Payer: 59 | Admitting: Student

## 2023-03-05 VITALS — BP 163/96 | HR 75 | Temp 97.8°F | Ht 71.0 in | Wt 255.2 lb

## 2023-03-05 DIAGNOSIS — E1169 Type 2 diabetes mellitus with other specified complication: Secondary | ICD-10-CM

## 2023-03-05 DIAGNOSIS — E785 Hyperlipidemia, unspecified: Secondary | ICD-10-CM | POA: Diagnosis not present

## 2023-03-05 DIAGNOSIS — E0800 Diabetes mellitus due to underlying condition with hyperosmolarity without nonketotic hyperglycemic-hyperosmolar coma (NKHHC): Secondary | ICD-10-CM | POA: Diagnosis not present

## 2023-03-05 DIAGNOSIS — Z7984 Long term (current) use of oral hypoglycemic drugs: Secondary | ICD-10-CM | POA: Diagnosis not present

## 2023-03-05 DIAGNOSIS — I110 Hypertensive heart disease with heart failure: Secondary | ICD-10-CM | POA: Diagnosis not present

## 2023-03-05 DIAGNOSIS — I10 Essential (primary) hypertension: Secondary | ICD-10-CM

## 2023-03-05 DIAGNOSIS — E08311 Diabetes mellitus due to underlying condition with unspecified diabetic retinopathy with macular edema: Secondary | ICD-10-CM

## 2023-03-05 DIAGNOSIS — I5020 Unspecified systolic (congestive) heart failure: Secondary | ICD-10-CM | POA: Diagnosis not present

## 2023-03-05 DIAGNOSIS — E1139 Type 2 diabetes mellitus with other diabetic ophthalmic complication: Secondary | ICD-10-CM

## 2023-03-05 DIAGNOSIS — Z1211 Encounter for screening for malignant neoplasm of colon: Secondary | ICD-10-CM | POA: Insufficient documentation

## 2023-03-05 LAB — GLUCOSE, CAPILLARY: Glucose-Capillary: 263 mg/dL — ABNORMAL HIGH (ref 70–99)

## 2023-03-05 LAB — POCT GLYCOSYLATED HEMOGLOBIN (HGB A1C): Hemoglobin A1C: 12.2 % — AB (ref 4.0–5.6)

## 2023-03-05 MED ORDER — EZETIMIBE 10 MG PO TABS: 10.0000 mg | ORAL_TABLET | Freq: Every day | ORAL | 2 refills | Status: DC

## 2023-03-05 MED ORDER — ATORVASTATIN CALCIUM 80 MG PO TABS: 80.0000 mg | ORAL_TABLET | Freq: Every day | ORAL | 3 refills | Status: DC

## 2023-03-05 MED ORDER — CLOPIDOGREL BISULFATE 75 MG PO TABS: 75.0000 mg | ORAL_TABLET | Freq: Every day | ORAL | 2 refills | Status: DC

## 2023-03-05 MED ORDER — CARVEDILOL 25 MG PO TABS: 25.0000 mg | ORAL_TABLET | Freq: Two times a day (BID) | ORAL | 2 refills | Status: DC

## 2023-03-05 MED ORDER — GLIPIZIDE 5 MG PO TABS: 5.0000 mg | ORAL_TABLET | Freq: Every day | ORAL | 11 refills | Status: DC

## 2023-03-05 MED ORDER — EMPAGLIFLOZIN 10 MG PO TABS: 10.0000 mg | ORAL_TABLET | Freq: Every day | ORAL | 3 refills | Status: DC

## 2023-03-05 MED ORDER — SACUBITRIL-VALSARTAN 49-51 MG PO TABS: 1.0000 | ORAL_TABLET | Freq: Two times a day (BID) | ORAL | 3 refills | Status: DC

## 2023-03-05 MED ORDER — EMPAGLIFLOZIN 25 MG PO TABS: 25.0000 mg | ORAL_TABLET | Freq: Every day | ORAL | 11 refills | Status: DC

## 2023-03-05 NOTE — Assessment & Plan Note (Signed)
 Patient referred for colon cancer screening today with colonoscopy.

## 2023-03-05 NOTE — Assessment & Plan Note (Signed)
 Patient has a past medical history of hypertension.  Current medications include Coreg  25 mg twice daily, Jardiance  10 mg daily, Entresto  49-51 mg twice daily.  He does endorse that he did not take his medicines today.  Not sure what his true blood pressure is.  Plan: -Blood pressure log given to fill out and bring back at next visit -Continue Entresto  49-51 mg twice daily -Continue Coreg  25 mg twice daily

## 2023-03-05 NOTE — Assessment & Plan Note (Signed)
 Patient with past medical history of hyperlipidemia.  Most recent lipid panel in February 2024 showing LDL of 84, triglycerides 150, cholesterol 138.  LDL is close to goal at that time, but will check again today.  Plan: -Repeat lipid panel today -Continue atorvastatin  80 mg daily -Continue Zetia  10 mg daily

## 2023-03-05 NOTE — Addendum Note (Signed)
 Addended by: Jonelle Neri on: 03/05/2023 09:26 PM   Modules accepted: Orders

## 2023-03-05 NOTE — Assessment & Plan Note (Signed)
 Patient endorses that he is having blurry vision.  He states he has not been to the eye doctor recently due to financial concerns.  Will refer today.  Plan: -Will refer to ophthalmologist for dilated retinal exam

## 2023-03-05 NOTE — Assessment & Plan Note (Addendum)
 Patient has a past medical history of HFrEF.  Patient's current medications include Coreg  25 mg twice daily, Jardiance  10 mg daily, Entresto  49-51 mg twice daily. Most recent echo on 04/08/2022 showing ejection fraction of 50 to 55%.  He looks euvolemic on exam.  He denies any shortness of breath, chest pains, or lower extremity edema.  On exam, patient does not have any concerns for fluid overload.  Plan: -Continue Coreg  25 mg daily -Increase Jardiance  to 25 mg daily -Continue Entresto  49-51 mg twice daily

## 2023-03-05 NOTE — Patient Instructions (Addendum)
 Gianlucas Cooler,Thank you for allowing me to take part in your care today.  Here are your instructions.  1.  I referred you for an ophthalmologist.  Please wait for phone call.  I have referred you to gastroenterology for colonoscopy.  2.  I checked some labs today, I will call you with results.  3.  Increase your Jardiance  to 25 mg daily.  4.  Make lifestyle modifications.  Your A1c was 12.2.  This is uncontrolled.  Our goal is less than 7.  5.  Please come back in 3 months.  Thank you, Dr. Tobie  If you have any other questions please contact the internal medicine clinic at [phone] If it is after hours, please call the [place] hospital at [phone] and then ask the person who picks up for the resident on call.

## 2023-03-05 NOTE — Progress Notes (Addendum)
 CC: Diabetes follow-up  HPI:  [name] is a 48 y.o. male with a past medical history of HFrEF, hypertension, type 2 diabetes mellitus who presents for follow-up appointment.  Please see assessment and plan for full HPI.  Medications: CAD: Aspirin  81 mg daily, atorvastatin  80 mg daily, Plavix  75 mg daily HFrEF: Coreg  25 mg twice daily, Jardiance  25 mg daily, Entresto  49-51 twice daily Diabetes: Glipizide  5 mg daily, Jardiance  25 mg daily Hyperlipidemia: Atorvastatin  80 mg daily, ezetimibe  10 mg daily  Past Medical History:  Diagnosis Date   Abdominal pain 01/07/2022   Angina pectoris (HCC) 12/17/2021   Blurred vision 02/04/2021   Chest pain    Elevated troponin 01/04/2022   HTN (hypertension)    Hyperlipidemia    Hypertensive emergency 01/04/2022   Infected epithelial inclusion cyst 03/14/2018   Leg edema, right 12/31/2021   Lesion of penis    foreskin   OSA (obstructive sleep apnea)    per pt dx osa and used cpap but after losing wt. from 415 pounds down to 244 pounds no longer needs cpap   Peripheral neuropathy    Peripheral neuropathy 05/21/2017   Phimosis    Scrotal abscess 05/08/2017   Type 2 diabetes mellitus (HCC)    per pt no meds for two years pt was changed to levemir  unable to afford but pt states is waiting on approval for a program he applied for that will pay for his meds [facility])       Current Outpatient Medications:    empagliflozin  (JARDIANCE ) 25 MG TABS tablet, Take 1 tablet (25 mg total) by mouth daily before breakfast., Disp: 30 tablet, Rfl: 11   aspirin  EC 81 MG tablet, Take 1 tablet (81 mg total) by mouth daily. Swallow whole., Disp: 30 tablet, Rfl: 12   atorvastatin  (LIPITOR) 80 MG tablet, Take 1 tablet (80 mg total) by mouth daily., Disp: 90 tablet, Rfl: 3   blood glucose meter kit and supplies, Dispense based on patient and insurance preference. Use up to four times daily as directed. (FOR ICD-10 E10.9, E11.9)., Disp: 1 each,  Rfl: 0   carvedilol  (COREG ) 25 MG tablet, Take 1 tablet (25 mg total) by mouth 2 (two) times daily with a meal., Disp: 180 tablet, Rfl: 2   clopidogrel  (PLAVIX ) 75 MG tablet, Take 1 tablet (75 mg total) by mouth daily., Disp: 90 tablet, Rfl: 2   ezetimibe  (ZETIA ) 10 MG tablet, Take 1 tablet (10 mg total) by mouth daily., Disp: 90 tablet, Rfl: 2   glipiZIDE  (GLUCOTROL ) 5 MG tablet, Take 1 tablet (5 mg total) by mouth daily before breakfast., Disp: 30 tablet, Rfl: 11   nitroGLYCERIN  (NITROSTAT ) 0.4 MG SL tablet, Place 1 tablet (0.4 mg total) under the tongue every 5 (five) minutes as needed for chest pain., Disp: 90 tablet, Rfl: 3   sacubitril -valsartan  (ENTRESTO ) 49-51 MG, Take 1 tablet by mouth 2 (two) times daily., Disp: 180 tablet, Rfl: 3  Review of Systems:    Eye: Patient endorses blurry vision Respiratory: Patient denies any shortness of breath or cough Cardiovascular: Patient denies any chest pain Endocrine: Patient endorses polyuria and polydipsia  Physical Exam:  Vitals:   03/05/23 0939  BP: (!) 163/96  Pulse: 75  Temp: 97.8 F (36.6 C)  TempSrc: Oral  SpO2: 100%  Weight: 255 lb 3.2 oz (115.8 kg)  Height: 5' 11 (1.803 m)   General: Patient is sitting comfortably in the room  Eyes: Pupils equal and reactive to light,  EOM intact  Cardio: Regular rate and rhythm, no murmurs, rubs or gallops Pulmonary: Clear to ausculation bilaterally with no rales, rhonchi, and crackles  Extremities: Left lower extremity with healing ulcer noted to plantar surface of left lower extremity.  Sensation slightly decreased.  Right lower extremity with healing ulcer noted to dorsal aspect of fourth digit.  Sensation decreased greater than left.  2+ pedal pulses.   Assessment & Plan:   CHF (congestive heart failure) (HCC) Patient has a past medical history of HFrEF.  Patient's current medications include Coreg  25 mg twice daily, Jardiance  10 mg daily, Entresto  49-51 mg twice daily. Most recent  echo on 04/08/2022 showing ejection fraction of 50 to 55%.  He looks euvolemic on exam.  He denies any shortness of breath, chest pains, or lower extremity edema.  On exam, patient does not have any concerns for fluid overload.  Plan: -Continue Coreg  25 mg daily -Increase Jardiance  to 25 mg daily -Continue Entresto  49-51 mg twice daily  Hypertension Patient has a past medical history of hypertension.  Current medications include Coreg  25 mg twice daily, Jardiance  10 mg daily, Entresto  49-51 mg twice daily.  He does endorse that he did not take his medicines today.  Not sure what his true blood pressure is.  Plan: -Blood pressure log given to fill out and bring back at next visit -Continue Entresto  49-51 mg twice daily -Continue Coreg  25 mg twice daily  Diabetes [facility]) Patient has a past medical history of diabetes.  His last A1c was 9.9 in December 2023.  His A1c today is elevated at 12.2.  His current medications include glipizide  daily and Jardiance  10 mg daily.  He reports he was on insulin  in the past, and did not like this as he had to inject himself and is unwilling to try this again.  He was also on Ozempic , and reported he had lots of abdominal pain with it and reported that he does not want this again either.  He also reports being on metformin  in the past which gave him GI side effects and therefore does not want to try this.  Unfortunately there are not many other options left for patient.  Counseled the patient on the importance of diabetic control as he is showing evidence of retinopathy as well as kidney disease.  He is also having sensation losses in his bilateral lower extremities.  If we do not control this now, he could have worsening effects of his hyperglycemia.  He understood this, and reports he would not like to start any new medications for diabetes at this time.  Counseled patient on lifestyle modifications.  Will refer to our diabetic specialist.  Plan: -Continue glipizide   5 mg daily -Increase Jardiance  to 25 mg daily -Counseling provided for lifestyle modifications -Patient referred for ophthalmology exam -Patient referred to our diabetic specialist -Patient to return in 3 months for A1c check -Handouts provided for diabetes and exercise as well as nutrition -Microalbumin creatinine ratio pending -BMP pending -Foot exam updated today  Diabetic retinopathy [facility]) Patient endorses that he is having blurry vision.  He states he has not been to the eye doctor recently due to financial concerns.  Will refer today.  Plan: -Will refer to ophthalmologist for dilated retinal exam  Hyperlipidemia associated with type 2 diabetes mellitus (HCC) Patient with past medical history of hyperlipidemia.  Most recent lipid panel in February 2024 showing LDL of 84, triglycerides 150, cholesterol 138.  LDL is close to goal at that time,  but will check again today.  Plan: -Repeat lipid panel today -Continue atorvastatin  80 mg daily -Continue Zetia  10 mg daily  Colon cancer screening Patient referred for colon cancer screening today with colonoscopy.  Patient discussed with Dr. Jerrell Libby Blanch, DO PGY-2 Internal Medicine Resident  Pager: [phone]

## 2023-03-05 NOTE — Assessment & Plan Note (Addendum)
 Patient has a past medical history of diabetes.  His last A1c was 9.9 in December 2023.  His A1c today is elevated at 12.2.  His current medications include glipizide  daily and Jardiance  10 mg daily.  He reports he was on insulin  in the past, and did not like this as he had to inject himself and is unwilling to try this again.  He was also on Ozempic , and reported he had lots of abdominal pain with it and reported that he does not want this again either.  He also reports being on metformin  in the past which gave him GI side effects and therefore does not want to try this.  Unfortunately there are not many other options left for patient.  Counseled the patient on the importance of diabetic control as he is showing evidence of retinopathy as well as kidney disease.  He is also having sensation losses in his bilateral lower extremities.  If we do not control this now, he could have worsening effects of his hyperglycemia.  He understood this, and reports he would not like to start any new medications for diabetes at this time.  Counseled patient on lifestyle modifications.  Will refer to our diabetic specialist.  Plan: -Continue glipizide  5 mg daily -Increase Jardiance  to 25 mg daily -Counseling provided for lifestyle modifications -Patient referred for ophthalmology exam -Patient referred to our diabetic specialist -Patient to return in 3 months for A1c check -Handouts provided for diabetes and exercise as well as nutrition -Microalbumin creatinine ratio pending -BMP pending -Foot exam updated today

## 2023-03-06 LAB — MICROALBUMIN / CREATININE URINE RATIO
Creatinine, Urine: 55.4 mg/dL
Microalb/Creat Ratio: 4918 mg/g{creat} — ABNORMAL HIGH (ref 0–29)
Microalbumin, Urine: 2724.8 ug/mL

## 2023-03-08 ENCOUNTER — Telehealth: Payer: Self-pay | Admitting: Student

## 2023-03-08 ENCOUNTER — Telehealth: Payer: Self-pay

## 2023-03-08 ENCOUNTER — Encounter: Payer: Self-pay | Admitting: Student

## 2023-03-08 DIAGNOSIS — E1169 Type 2 diabetes mellitus with other specified complication: Secondary | ICD-10-CM

## 2023-03-08 LAB — LIPID PANEL
Chol/HDL Ratio: 7.4 {ratio} — ABNORMAL HIGH (ref 0.0–5.0)
Cholesterol, Total: 208 mg/dL — ABNORMAL HIGH (ref 100–199)
HDL: 28 mg/dL — ABNORMAL LOW (ref >39)
LDL Chol Calc (NIH): 149 mg/dL — ABNORMAL HIGH (ref 0–99)
Triglycerides: 169 mg/dL — ABNORMAL HIGH (ref 0–149)
VLDL Cholesterol Cal: 31 mg/dL (ref 5–40)

## 2023-03-08 LAB — BMP8+ANION GAP
Anion Gap: 16 mmol/L (ref 10.0–18.0)
BUN/Creatinine Ratio: 14 (ref 9–20)
BUN: 23 mg/dL (ref 6–24)
CO2: 22 mmol/L (ref 20–29)
Calcium: 8.9 mg/dL (ref 8.7–10.2)
Chloride: 100 mmol/L (ref 96–106)
Creatinine, Ser: 1.7 mg/dL — ABNORMAL HIGH (ref 0.76–1.27)
Glucose: 254 mg/dL — ABNORMAL HIGH (ref 70–99)
Potassium: 4.4 mmol/L (ref 3.5–5.2)
Sodium: 138 mmol/L (ref 134–144)
eGFR: 50 mL/min/{1.73_m2} — ABNORMAL LOW (ref >59)

## 2023-03-08 NOTE — Telephone Encounter (Signed)
 Decision:Approved  Shaun Cummings ([name]) PA Case ID #: [id number] Rx #: [id number] Need Help? Call us  at [phone] Outcome Approved today by [facility] NCPDP 2017 Your PA request has been approved. Additional information will be provided in the approval communication. (Message 1145) Authorization Expiration Date: 03/06/2024 Drug Jardiance  10MG  tablets ePA cloud logo Form [employer] Electronic PA Form [phone] NCPDP) Original Claim Info [id number]

## 2023-03-08 NOTE — Telephone Encounter (Signed)
 Referred patient to lipid clinic.  Please see result note.

## 2023-03-08 NOTE — Progress Notes (Signed)
 Internal Medicine Clinic Attending  Case discussed with the resident physician at the time of the visit.  We reviewed the patient's history, exam, and pertinent patient test results.  I agree with the assessment, diagnosis, and plan of care documented in the resident's note.

## 2023-03-08 NOTE — Telephone Encounter (Signed)
 Prior Authorization for patient (Jardiance  10MG  tablets) came through on cover my meds was submitted with last office notes and labs awaiting approval or denial.  ZOX:[id number]

## 2023-03-11 ENCOUNTER — Telehealth: Payer: Self-pay

## 2023-03-11 DIAGNOSIS — H409 Unspecified glaucoma: Secondary | ICD-10-CM | POA: Diagnosis not present

## 2023-03-11 DIAGNOSIS — E113513 Type 2 diabetes mellitus with proliferative diabetic retinopathy with macular edema, bilateral: Secondary | ICD-10-CM | POA: Diagnosis not present

## 2023-03-11 DIAGNOSIS — H2513 Age-related nuclear cataract, bilateral: Secondary | ICD-10-CM | POA: Diagnosis not present

## 2023-03-11 DIAGNOSIS — H35033 Hypertensive retinopathy, bilateral: Secondary | ICD-10-CM | POA: Diagnosis not present

## 2023-03-11 DIAGNOSIS — H4311 Vitreous hemorrhage, right eye: Secondary | ICD-10-CM | POA: Diagnosis not present

## 2023-03-11 LAB — HM DIABETES EYE EXAM

## 2023-03-11 NOTE — Telephone Encounter (Signed)
Prior Authorization for patient (Jardiance 25MG  tablets) came through on cover my meds was submitted awaiting approval or denial.  ZOX:[id number]

## 2023-03-12 NOTE — Telephone Encounter (Signed)
Shaun Cummings (Key: [id number]) PA Case ID #: [id number] Need Help? Call us at [phone] Outcome Approved on February 13 by [facility] NCPDP 2017 Your PA request has been approved. Additional information will be provided in the approval communication. (Message 1145) Authorization Expiration Date: 03/09/2024 Drug Jardiance 25MG  tablets ePA cloud logo Form [employer] Electronic PA Form [phone] NCPDP)

## 2023-03-17 ENCOUNTER — Ambulatory Visit: Payer: 59 | Admitting: Vascular Surgery

## 2023-03-17 ENCOUNTER — Encounter: Payer: Self-pay | Admitting: Vascular Surgery

## 2023-03-17 ENCOUNTER — Ambulatory Visit (HOSPITAL_COMMUNITY)
Admission: RE | Admit: 2023-03-17 | Discharge: 2023-03-17 | Disposition: A | Discharge status: Home / Self Care | Payer: 59 | Source: Ambulatory Visit | Attending: Vascular Surgery | Admitting: Vascular Surgery

## 2023-03-17 VITALS — BP 125/83 | HR 69 | Temp 97.9°F | Resp 20 | Ht 71.0 in | Wt 246.8 lb

## 2023-03-17 DIAGNOSIS — I719 Aortic aneurysm of unspecified site, without rupture: Secondary | ICD-10-CM

## 2023-03-17 NOTE — Progress Notes (Signed)
Patient ID: Shaun Cummings, male   DOB: [birth year], 48 y.o.   MRN: [id number]  Reason for Consult: Follow-up   Referred by Jeral Pinch, DO  Subjective:     HPI:  Shaun Cummings is a 48 y.o. 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  He does have occasional abdominal pains but overall states that he has been in his usual level of health.  He was a former marijuana smoker but quit at the time of his diagnosis and also was a former cigarette smoker but also quit.  He does take aspirin every day.  He remains in his usual weight and usual level of activity.  At the time of his initial diagnosis of PAU his pain was ultimately attributed to coronary artery disease and he underwent PCI and he is without chest pain since that time.  Past Medical History:  Diagnosis Date   Abdominal pain 01/07/2022   Angina pectoris (HCC) 12/17/2021   Blurred vision 02/04/2021   Chest pain    Elevated troponin 01/04/2022   HTN (hypertension)    Hyperlipidemia    Hypertensive emergency 01/04/2022   Infected epithelial inclusion cyst 03/14/2018   Leg edema, right 12/31/2021   Lesion of penis    foreskin   OSA (obstructive sleep apnea)    per pt dx osa and used cpap but after losing wt. from 415 pounds down to 244 pounds no longer needs cpap   Peripheral neuropathy    Peripheral neuropathy 05/21/2017   Phimosis    Scrotal abscess 05/08/2017   Type 2 diabetes mellitus (HCC)    per pt no meds for two years pt was changed to levemir unable to afford but pt states is waiting on approval for a program he applied for that will pay for his meds [facility])     Family History  Problem Relation Age of Onset   Diabetes Mother    Angina Mother    CAD Mother    Heart disease Mother    Glaucoma Father    Diabetes Father    Heart attack Father    Kidney failure Father    Diabetes Brother    Glaucoma Maternal Grandmother    Diabetes Maternal Grandmother    Heart  disease Maternal Grandmother    Glaucoma Maternal Grandfather    Diabetes Maternal Grandfather    Heart disease Maternal Grandfather    Glaucoma Paternal Grandmother    Diabetes Paternal Grandmother    Glaucoma Paternal Grandfather    Diabetes Paternal Grandfather    Asthma Son    Past Surgical History:  Procedure Laterality Date   CIRCUMCISION N/A 02/10/2016   Procedure: CIRCUMCISION ADULT;  Surgeon: Malen Gauze, MD;  Location: [facility];  Service: Urology;  Laterality: N/A;   CORONARY STENT INTERVENTION N/A 01/14/2022   Procedure: CORONARY STENT INTERVENTION;  Surgeon: Tonny Bollman, MD;  Location: [facility] INVASIVE CV LAB;  Service: Cardiovascular;  Laterality: N/A;   INCISION AND DRAINAGE ABSCESS Right 05/12/2017   Procedure: INCISION AND DRAINAGE RIGHT INGUINAL ABSCESS;  Surgeon: Harriette Bouillon, MD;  Location: [facility] OR;  Service: General;  Laterality: Right;   INCISION AND DRAINAGE ABSCESS N/A 03/24/2018   Procedure: INCISION AND DRAINAGE POSTERIOR NECK ABSCESS;  Surgeon: Glenna Fellows, MD;  Location: [facility] OR;  Service: General;  Laterality: N/A;   INCISION AND DRAINAGE ABSCESS Right 06/19/2020   Procedure: INCISION AND DRAINAGE ABSCESS, RIGHT GROIN;  Surgeon: Luretha Murphy, MD;  Location:  [facility] ORS;  Service: General;  Laterality: Right;   INCISION AND DRAINAGE PERIRECTAL ABSCESS Right 05/09/2017   Procedure: IRRIGATION AND DEBRIDEMENT PERINEAL ABSCESS;  Surgeon: Manus Rudd, MD;  Location: [facility] OR;  Service: General;  Laterality: Right;   LEFT HEART CATH AND CORONARY ANGIOGRAPHY N/A 01/06/2022   Procedure: LEFT HEART CATH AND CORONARY ANGIOGRAPHY;  Surgeon: Lennette Bihari, MD;  Location: [facility] INVASIVE CV LAB;  Service: Cardiovascular;  Laterality: N/A;    Short Social History:  Social History   Tobacco Use   Smoking status: Former    Current packs/day: 0.00    Types: Cigarettes    Start date: 02/04/2006    Quit date: 02/05/2008    Years since quitting:  15.1   Smokeless tobacco: Never  Substance Use Topics   Alcohol use: No    Allergies  Allergen Reactions   Augmentin [Amoxicillin-Pot Clavulanate] Hives and Itching    Has patient had a PCN reaction causing immediate rash, facial/tongue/throat swelling, SOB or lightheadedness with hypotension: Yes Has patient had a PCN reaction causing severe rash involving mucus membranes or skin necrosis: No Has patient had a PCN reaction that required hospitalization: No Has patient had a PCN reaction occurring within the last 10 years: No If all of the above answers are "NO", then may proceed with Cephalosporin use.   Contrast Media [Iodinated Contrast Media] Hives    Pt broke out with 1 hive on forehead after CT injection-treated with 25mg  Benadryl    Current Outpatient Medications  Medication Sig Dispense Refill   aspirin EC 81 MG tablet Take 1 tablet (81 mg total) by mouth daily. Swallow whole. 30 tablet 12   atorvastatin (LIPITOR) 80 MG tablet Take 1 tablet (80 mg total) by mouth daily. 90 tablet 3   carvedilol (COREG) 25 MG tablet Take 1 tablet (25 mg total) by mouth 2 (two) times daily with a meal. 180 tablet 2   clopidogrel (PLAVIX) 75 MG tablet Take 1 tablet (75 mg total) by mouth daily. 90 tablet 2   empagliflozin (JARDIANCE) 25 MG TABS tablet Take 1 tablet (25 mg total) by mouth daily before breakfast. 30 tablet 11   ezetimibe (ZETIA) 10 MG tablet Take 1 tablet (10 mg total) by mouth daily. 90 tablet 2   glipiZIDE (GLUCOTROL) 5 MG tablet Take 1 tablet (5 mg total) by mouth daily before breakfast. 30 tablet 11   sacubitril-valsartan (ENTRESTO) 49-51 MG Take 1 tablet by mouth 2 (two) times daily. 180 tablet 3   nitroGLYCERIN (NITROSTAT) 0.4 MG SL tablet Place 1 tablet (0.4 mg total) under the tongue every 5 (five) minutes as needed for chest pain. 90 tablet 3   No current facility-administered medications for this visit.    Review of Systems  Constitutional:  Constitutional  negative. HENT: HENT negative.  Eyes: Eyes negative.  Respiratory: Respiratory negative.  Cardiovascular: Cardiovascular negative.  GI: Gastrointestinal negative.  Musculoskeletal: Musculoskeletal negative.  Skin: Skin negative.  Neurological: Neurological negative. Hematologic: Hematologic/lymphatic negative.  Psychiatric: Psychiatric negative.        Objective:  Objective   Vitals:   03/17/23 1023  BP: 125/83  Pulse: 69  Resp: 20  Temp: 97.9 F (36.6 C)  SpO2: 97%  Weight: 246 lb 12.8 oz (111.9 kg)  Height: 5\' 11"  (1.803 m)   Body mass index is 34.42 kg/m.  Physical Exam HENT:     Head: Normocephalic.     Nose: Nose normal.  Eyes:     Pupils: Pupils are equal,  round, and reactive to light.  Cardiovascular:     Rate and Rhythm: Normal rate.     Pulses:          Dorsalis pedis pulses are 2+ on the right side and 2+ on the left side.       Posterior tibial pulses are 2+ on the right side and 0 on the left side.  Pulmonary:     Effort: Pulmonary effort is normal.  Abdominal:     General: Abdomen is flat.     Palpations: Abdomen is soft. There is no mass.  Skin:    General: Skin is warm.     Capillary Refill: Capillary refill takes less than 2 seconds.  Neurological:     General: No focal deficit present.     Mental Status: He is alert.  Psychiatric:        Mood and Affect: Mood normal.        Thought Content: Thought content normal.        Judgment: Judgment normal.     Data: Abdominal Aorta Findings:  +-----------+-------+----------+----------+---------+--------+--------+  Location  AP (cm)Trans (cm)PSV (cm/s)Waveform ThrombusComments  +-----------+-------+----------+----------+---------+--------+--------+  Proximal  2.21   2.35      83        triphasic                  +-----------+-------+----------+----------+---------+--------+--------+  Mid       2.14   2.35      78        triphasic                   +-----------+-------+----------+----------+---------+--------+--------+  Distal    1.98   2.07      68        triphasic                  +-----------+-------+----------+----------+---------+--------+--------+  RT CIA Prox1.3    1.4       85        triphasic                  +-----------+-------+----------+----------+---------+--------+--------+  RT EIA Prox                 91        triphasic                  +-----------+-------+----------+----------+---------+--------+--------+  LT CIA Prox1.2    1.4       89        triphasic                  +-----------+-------+----------+----------+---------+--------+--------+  LT EIA Prox                 109       triphasic                  +-----------+-------+----------+----------+---------+--------+--------+   Visualization of the Proximal Abdominal Aorta was limited.       Summary:  Abdominal Aorta: No evidence of an abdominal aortic aneurysm was  visualized. The largest aortic measurement is 2.4 cm. Limited  visualization of the proximal segment due to overlying bowel gas.      Assessment/Plan:    48 year old male with a history of PACU not visualized today on ultrasound.  The aorta really looks normal by duplex and he can follow-up in 2 years with repeat duplex.  Certainly if he has abdominal or back pain in the interim this should be  a consideration but overall he appears to be progressing well.     Maeola Harman MD [facility]

## 2023-03-25 ENCOUNTER — Ambulatory Visit (INDEPENDENT_AMBULATORY_CARE_PROVIDER_SITE_OTHER): Payer: 59 | Admitting: Dietician

## 2023-03-25 ENCOUNTER — Other Ambulatory Visit: Payer: Self-pay | Admitting: Dietician

## 2023-03-25 DIAGNOSIS — Z794 Long term (current) use of insulin: Secondary | ICD-10-CM | POA: Diagnosis not present

## 2023-03-25 DIAGNOSIS — E1139 Type 2 diabetes mellitus with other diabetic ophthalmic complication: Secondary | ICD-10-CM

## 2023-03-25 MED ORDER — DEXCOM G7 SENSOR MISC: 3 refills | Status: DC

## 2023-03-25 NOTE — Progress Notes (Signed)
 BP Readings from Last 3 Encounters:  03/17/23 125/83  03/05/23 (!) 163/96  08/26/22 (!) 151/92   Wt Readings from Last 10 Encounters:  03/17/23 246 lb 12.8 oz (111.9 kg)  03/05/23 255 lb 3.2 oz (115.8 kg)  08/26/22 252 lb 12.8 oz (114.7 kg)  05/14/22 241 lb 6.4 oz (109.5 kg)  04/08/22 230 lb (104.3 kg)  03/18/22 231 lb 6.4 oz (105 kg)  03/05/22 233 lb 12.8 oz (106.1 kg)  02/25/22 230 lb (104.3 kg)  02/24/22 230 lb (104.3 kg)  01/30/22 231 lb (104.8 kg)   Lab Results  Component Value Date   HGBA1C 12.2 (A) 03/05/2023   HGBA1C 9.9 (H) 01/06/2022   HGBA1C 11.5 (A) 12/16/2021   HGBA1C 9.6 (A) 04/30/2021   HGBA1C 13.4 (A) 02/03/2021   Diabetes Self-Management Education  Visit Type: Annual Follow-Up  Appt. Start Time: 1315 Appt. End Time: 1400  03/25/2023  Mr. Shaun Cummings, identified by name and date of birth, is a 48 y.o. male with a diagnosis of Diabetes:  Marland Kitchen Type 2  ASSESSMENT  He states his goal is to lower his A1c to 9%. Encourage <8% once he gets to 9%.    Diabetes Self-Management Education - 03/25/23 1400       Visit Information   Visit Type Annual Follow-Up      Health Coping   How would you rate your overall health? Good   has made some changes to his food and is feeling better     Psychosocial Assessment   Patient Belief/Attitude about Diabetes Motivated to manage diabetes    What is the hardest part about your diabetes right now, causing you the most concern, or is the most worrisome to you about your diabetes?   Checking blood sugar    Self-care barriers Lack of material resources    Self-management support Family;Friends;CDE visits;Doctor's office   says he has lot's of support   Other persons present Family Member   his brother who is a "special needs" person and comes with him often   Patient Concerns Glycemic Control;Monitoring    Special Needs None    Preferred Learning Style No preference indicated    Learning Readiness Ready    How often do you  need to have someone help you when you read instructions, pamphlets, or other written materials from your doctor or pharmacy? 1 - Never    What is the last grade level you completed in school? 12      Pre-Education Assessment   Patient understands the diabetes disease and treatment process. Comprehends key points    Patient understands incorporating nutritional management into lifestyle. Comprehends key points    Patient undertands incorporating physical activity into lifestyle. Comprehends key points    Patient understands using medications safely. Comprehends key points    Patient understands monitoring blood glucose, interpreting and using results Needs Review    Patient understands prevention, detection, and treatment of acute complications. Needs Review    Patient understands prevention, detection, and treatment of chronic complications. Compreheands key points    Patient understands how to develop strategies to address psychosocial issues. Comprehends key points    Patient understands how to develop strategies to promote health/change behavior. Comprehends key points      Complications   Last HgB A1C per patient/outside source 12.2 %    How often do you check your blood sugar? 0 times/day (not testing)    Number of hypoglycemic episodes per month 0    Number  of hyperglycemic episodes ( >200mg /dL): Weekly   was having symptoms daily prior to making changes to his beverage and food intake- cut back on sugar significantly   Can you tell when your blood sugar is high? Yes    What do you do if your blood sugar is high? gets sleepy and thirsty    Have you had a dilated eye exam in the past 12 months? No    Have you had a dental exam in the past 12 months? No    Are you checking your feet? Yes    How many days per week are you checking your feet? 7      Dietary Intake   Breakfast 7 am- 1/2 c capt crunch 1/4 c 2% milk, 2 c coffee with 2 creams and equal    Lunch 1215 bowl of soup w/  crackers x3    Dinner 730 PM was eating out often pizze etc, now soup with potatoes cut up in it    Beverage(s) coffee, soda zer osugars, water      Activity / Exercise   Activity / Exercise Type ADL's;Light (walking / raking leaves)   denies doing activity, sits most of day and at work   How many days per week do you exercise? --   not interested in change     Patient Education   Previous Diabetes Education Yes (please comment)   here   Monitoring Taught/evaluated CGM (comment)   used to use Freestyle Lbire 3, taught today on how to use dexcom G7 and givine a sample   Acute complications Discussed and identified patients' prevention, symptoms, and treatment of hyperglycemia.      Individualized Goals (developed by patient)   Monitoring  Consistenly use CGM      Post-Education Assessment   Patient understands monitoring blood glucose, interpreting and using results Comprehends key points    Patient understands prevention, detection, and treatment of acute complications. Comprehends key points      Outcomes   Expected Outcomes Demonstrated interest in learning but significant barriers to change    Future DMSE 3-4 months    Program Status Completed      Subsequent Visit   Since your last visit have you continued or begun to take your medications as prescribed? Yes    Since your last visit have you had your blood pressure checked? Yes    Is your most recent blood pressure lower, unchanged, or higher since your last visit? Lower    Since your last visit have you experienced any weight changes? Gain    Weight Gain (lbs) 15    Since your last visit, are you checking your blood glucose at least once a day? No   he will not use finger stick and CGM got too expensive. we checked today and dexcom was estimated to be 15$ per month and he states this is affordable            Individualized Plan for Diabetes Self-Management Training:   Learning Objective:  Patient will have a greater  understanding of diabetes self-management. Patient education plan is to attend individual and/or group sessions per assessed needs and concerns.   Plan:   Patient Instructions  Thank you for your visit today!  Suggest follow up in 3 month son same day you see the doctor. You are going to finish setting up he Dexcom G7 app and start the sensor that is on your arm.  Enjoy using the sensor. I will  request a 3 month supply.  Great job making changes to your food and beverage intake. Happy to answer any questions you and your support team have.  Please feel free to call me anytime.  Lupita Leash [phone]   Expected Outcomes:  Demonstrated interest in learning but significant barriers to change  Education material provided: Diabetes Resources  If problems or questions, patient to contact team via:  Phone  Future DSME appointment: 3-4 months

## 2023-03-25 NOTE — Patient Instructions (Addendum)
 Thank you for your visit today!  Suggest follow up in 3 month son same day you see the doctor. You are going to finish setting up he Dexcom G7 app and start the sensor that is on your arm.  Enjoy using the sensor. I will request a 3 month supply.  Great job making changes to your food and beverage intake. Happy to answer any questions you and your support team have.  Please feel free to call me anytime.  Lupita Leash [phone]

## 2023-03-25 NOTE — Telephone Encounter (Signed)
 Requests Dexcom G7 sensors.

## 2023-03-26 ENCOUNTER — Telehealth: Payer: Self-pay | Admitting: *Deleted

## 2023-03-26 NOTE — Telephone Encounter (Signed)
-----   Message from [facility] sent at 03/25/2023  5:01 PM EST ----- Regarding: can you call him? Tell him that if CGM not covered it is because he is not on insulin.   I should have thought to mention that to him but didnlt s oI am afradi he will be dissappointed.  Thank you!

## 2023-03-26 NOTE — Telephone Encounter (Addendum)
 Call to patient to inform him that his insurance may not cover a CGM for him.  Patient is not currently taking Insulin.  Message was left on patient's Voice mail and asked to return call if questions.

## 2023-09-03 ENCOUNTER — Encounter: Payer: Self-pay | Admitting: Student

## 2023-09-03 ENCOUNTER — Ambulatory Visit (INDEPENDENT_AMBULATORY_CARE_PROVIDER_SITE_OTHER): Payer: Self-pay | Admitting: Student

## 2023-09-03 VITALS — BP 159/102 | HR 76 | Temp 98.2°F | Ht 72.0 in | Wt 258.6 lb

## 2023-09-03 DIAGNOSIS — E1139 Type 2 diabetes mellitus with other diabetic ophthalmic complication: Secondary | ICD-10-CM | POA: Diagnosis not present

## 2023-09-03 DIAGNOSIS — E1169 Type 2 diabetes mellitus with other specified complication: Secondary | ICD-10-CM

## 2023-09-03 DIAGNOSIS — I10 Essential (primary) hypertension: Secondary | ICD-10-CM

## 2023-09-03 DIAGNOSIS — E785 Hyperlipidemia, unspecified: Secondary | ICD-10-CM

## 2023-09-03 DIAGNOSIS — Z7984 Long term (current) use of oral hypoglycemic drugs: Secondary | ICD-10-CM

## 2023-09-03 DIAGNOSIS — Z794 Long term (current) use of insulin: Secondary | ICD-10-CM

## 2023-09-03 DIAGNOSIS — N5201 Erectile dysfunction due to arterial insufficiency: Secondary | ICD-10-CM

## 2023-09-03 DIAGNOSIS — I5020 Unspecified systolic (congestive) heart failure: Secondary | ICD-10-CM | POA: Diagnosis not present

## 2023-09-03 DIAGNOSIS — Z6835 Body mass index (BMI) 35.0-35.9, adult: Secondary | ICD-10-CM

## 2023-09-03 DIAGNOSIS — I110 Hypertensive heart disease with heart failure: Secondary | ICD-10-CM

## 2023-09-03 DIAGNOSIS — E669 Obesity, unspecified: Secondary | ICD-10-CM

## 2023-09-03 DIAGNOSIS — L84 Corns and callosities: Secondary | ICD-10-CM

## 2023-09-03 LAB — GLUCOSE, CAPILLARY: Glucose-Capillary: 194 mg/dL — ABNORMAL HIGH (ref 70–99)

## 2023-09-03 LAB — POCT GLYCOSYLATED HEMOGLOBIN (HGB A1C): Hemoglobin A1C: 9.8 % — AB (ref 4.0–5.6)

## 2023-09-03 MED ORDER — TADALAFIL 5 MG PO TABS: 5.0000 mg | ORAL_TABLET | Freq: Every day | ORAL | 0 refills | Status: AC | PRN

## 2023-09-03 MED ORDER — CARVEDILOL 25 MG PO TABS: 25.0000 mg | ORAL_TABLET | Freq: Two times a day (BID) | ORAL | 2 refills | Status: AC

## 2023-09-03 MED ORDER — GLIPIZIDE 10 MG PO TABS: 10.0000 mg | ORAL_TABLET | Freq: Every day | ORAL | 3 refills | Status: AC

## 2023-09-03 MED ORDER — SACUBITRIL-VALSARTAN 49-51 MG PO TABS: 1.0000 | ORAL_TABLET | Freq: Two times a day (BID) | ORAL | 3 refills | Status: AC

## 2023-09-03 MED ORDER — ATORVASTATIN CALCIUM 80 MG PO TABS: 80.0000 mg | ORAL_TABLET | Freq: Every day | ORAL | 3 refills | Status: AC

## 2023-09-03 MED ORDER — EMPAGLIFLOZIN 25 MG PO TABS: 25.0000 mg | ORAL_TABLET | Freq: Every day | ORAL | 3 refills | Status: AC

## 2023-09-03 MED ORDER — DEXCOM G7 SENSOR MISC: 3 refills | Status: AC

## 2023-09-03 MED ORDER — EZETIMIBE 10 MG PO TABS: 10.0000 mg | ORAL_TABLET | Freq: Every day | ORAL | 3 refills | Status: AC

## 2023-09-03 NOTE — Assessment & Plan Note (Signed)
 BP Readings from Last 3 Encounters:  09/03/23 (!) 159/102  03/17/23 125/83  03/05/23 (!) 163/96   Non-adherent to medication for last 2 weeks. Refilled today, continue carvedilol  25 mg bid and entresto  49-51 bid.

## 2023-09-03 NOTE — Progress Notes (Signed)
 Patient name: Shaun Cummings Date of birth: 11/27/1975 Date of visit: 09/03/23  Subjective   Chief concern: regular checkup, needs refills  Heart attack in 2023, takes daily aspirin  and high-intensity statin. Looks like he had NSTEMI with intervention via stent to RCA. At that time an aortic ulcer was discovered, which appears to have healed as of February 2025.  Review of Systems  Eyes:  Negative for blurred vision.  Respiratory:  Negative for cough and shortness of breath.   Cardiovascular:  Negative for chest pain.  Genitourinary:  Positive for frequency.       No trouble with stream, doesn't strain to urinate.  Neurological:  Negative for headaches.    Current Outpatient Medications  Medication Instructions   aspirin  EC 81 mg, Oral, Daily, Swallow whole.   atorvastatin  (LIPITOR) 80 mg, Oral, Daily   carvedilol  (COREG ) 25 mg, Oral, 2 times daily with meals   clopidogrel  (PLAVIX ) 75 mg, Oral, Daily   Continuous Glucose Sensor (DEXCOM G7 SENSOR) MISC Place new sensor every 10 days. Use to monitor blood sugar continuously.   empagliflozin  (JARDIANCE ) 25 mg, Oral, Daily before breakfast   ezetimibe  (ZETIA ) 10 mg, Oral, Daily   glipiZIDE  (GLUCOTROL ) 5 mg, Oral, Daily before breakfast   nitroGLYCERIN  (NITROSTAT ) 0.4 mg, Sublingual, Every 5 min PRN   sacubitril -valsartan  (ENTRESTO ) 49-51 MG 1 tablet, Oral, 2 times daily     Objective  Today's Vitals   09/03/23 1028 09/03/23 1032  BP: (!) 196/108 (!) 159/102  Pulse: 76 76  Temp: 98.2 F (36.8 C)   TempSrc: Oral   SpO2: 96%   Weight: 258 lb 9.6 oz (117.3 kg)   Height: 6' (1.829 m)   PainSc: 0-No pain   Body mass index is 35.07 kg/m.   Physical Exam Constitutional:      Appearance: Normal appearance.  Cardiovascular:     Rate and Rhythm: Normal rate and regular rhythm.     Heart sounds: No murmur heard. Pulmonary:     Effort: Pulmonary effort is normal. No respiratory distress.  Musculoskeletal:     Comments:  Non-ulcerated calluses on bilateral feet  Skin:    General: Skin is warm and dry.  Neurological:     Mental Status: He is alert.     Cranial Nerves: No facial asymmetry.  Psychiatric:        Mood and Affect: Affect normal.        Speech: Speech normal.        Behavior: Behavior normal.    Diabetic Foot Exam - Simple   Simple Foot Form Diabetic Foot exam was performed with the following findings: Yes 09/03/2023 11:26 AM  Visual Inspection See comments: Yes Sensation Testing See comments: Yes Pulse Check Posterior Tibialis and Dorsalis pulse intact bilaterally: Yes Comments Podiatry referral Hammer toe Right foot (-) forth and little toe  / great toe  Large callus under great toe       Assessment & Plan   Type 2 diabetes mellitus with other ophthalmic complication, with long-term current use of insulin  (HCC) Assessment & Plan: Chronic, poor control, improved since last visit.  Lab Results  Component Value Date   HGBA1C 9.8 (A) 09/03/2023   HGBA1C 12.2 (A) 03/05/2023   HGBA1C 9.9 (H) 01/06/2022   Takes Jardiance  25 mg daily and glipizide  5 mg daily. GLP-1 intolerable due to GI side-effects. Increase glipizide  to 10 mg daily. Talked about dietary modifications and exercise for weight loss.   Orders: -     POCT  glycosylated hemoglobin (Hb A1C) -     Glucose, capillary -     Dexcom G7 Sensor; Place new sensor every 10 days. Use to monitor blood sugar continuously.  Dispense: 9 each; Refill: 3 -     Empagliflozin ; Take 1 tablet (25 mg total) by mouth daily before breakfast.  Dispense: 90 tablet; Refill: 3 -     glipiZIDE ; Take 1 tablet (10 mg total) by mouth daily before breakfast.  Dispense: 90 tablet; Refill: 3 -     Basic metabolic panel with GFR  Obesity (BMI 30-39.9) Assessment & Plan: Body mass index is 35.07 kg/m.  I recommend eliminating sugary beverages like soda, sweet tea, and juice entirely. I recommended more vegetables, lean protein, and legumes. Frozen  vegetables are healthy and inexpensive. Beans are a healthy and inexpensive source of lean protein and fiber. I recommend gradually increasing exercise. A daily walk is a great way to start an exercise program.  Referral: working with Shaun Cummings  Pharmacological intervention: intolerant of GLP-1, phentermine not a safe option    Primary hypertension Assessment & Plan: BP Readings from Last 3 Encounters:  09/03/23 (!) 159/102  03/17/23 125/83  03/05/23 (!) 163/96   Non-adherent to medication for last 2 weeks. Refilled today, continue carvedilol  25 mg bid and entresto  49-51 bid.   Erectile dysfunction due to arterial insufficiency Assessment & Plan: Difficulty maintaining erection during sex and masturbation. I think sexual activity is safe for him, he hasn't had any exertional chest pain or dyspnea recently. Start trial of tadalafil  5 mg daily as needed on days when sexual activity is expected. Counseled about avoiding nitrate containing medications while on tadalafil . He hasn't taken nitroglycerine for chest pain in over a year.  Orders: -     Tadalafil ; Take 1 tablet (5 mg total) by mouth daily as needed for erectile dysfunction.  Dispense: 10 tablet; Refill: 0  Hyperlipidemia associated with type 2 diabetes mellitus (HCC) -     Atorvastatin  Calcium ; Take 1 tablet (80 mg total) by mouth daily.  Dispense: 90 tablet; Refill: 3 -     Ezetimibe ; Take 1 tablet (10 mg total) by mouth daily.  Dispense: 90 tablet; Refill: 3  Systolic congestive heart failure, unspecified HF chronicity (HCC) -     Carvedilol ; Take 1 tablet (25 mg total) by mouth 2 (two) times daily with a meal.  Dispense: 180 tablet; Refill: 2 -     Empagliflozin ; Take 1 tablet (25 mg total) by mouth daily before breakfast.  Dispense: 90 tablet; Refill: 3 -     Sacubitril -Valsartan ; Take 1 tablet by mouth 2 (two) times daily.  Dispense: 180 tablet; Refill: 3 -     Basic metabolic panel with GFR  Pre-ulcerative corn or  callous -     Ambulatory referral to Podiatry    Return in about 3 months (around 12/04/2023) for diabetes, erectile dysfunction.  Shaun Kung MD 09/03/2023, 2:29 PM

## 2023-09-03 NOTE — Assessment & Plan Note (Signed)
 Difficulty maintaining erection during sex and masturbation. I think sexual activity is safe for him, he hasn't had any exertional chest pain or dyspnea recently. Start trial of tadalafil  5 mg daily as needed on days when sexual activity is expected. Counseled about avoiding nitrate containing medications while on tadalafil . He hasn't taken nitroglycerine for chest pain in over a year.

## 2023-09-03 NOTE — Assessment & Plan Note (Signed)
 Body mass index is 35.07 kg/m.  I recommend eliminating sugary beverages like soda, sweet tea, and juice entirely. I recommended more vegetables, lean protein, and legumes. Frozen vegetables are healthy and inexpensive. Beans are a healthy and inexpensive source of lean protein and fiber. I recommend gradually increasing exercise. A daily walk is a great way to start an exercise program.  Referral: working with Arland Hole  Pharmacological intervention: intolerant of GLP-1, phentermine not a safe option

## 2023-09-03 NOTE — Patient Instructions (Signed)
 Return in about 3 months (around 12/04/2023) for diabetes, erectile dysfunction.  Take Cialis  in the morning on a day when you're going to be sexually active.  Remember to bring all of the medications that you take (including over the counter medications and supplements) with you to every clinic visit.  This after visit summary is an important review of tests, referrals, and medication changes that were discussed during your visit. If you have questions or concerns, call [phone]. Outside of clinic business hours, call the main hospital at [phone] and ask the operator for the on-call internal medicine resident.   Ozell Kung MD 09/03/2023, 11:42 AM

## 2023-09-03 NOTE — Assessment & Plan Note (Signed)
 Chronic, poor control, improved since last visit.  Lab Results  Component Value Date   HGBA1C 9.8 (A) 09/03/2023   HGBA1C 12.2 (A) 03/05/2023   HGBA1C 9.9 (H) 01/06/2022   Takes Jardiance  25 mg daily and glipizide  5 mg daily. GLP-1 intolerable due to GI side-effects. Increase glipizide  to 10 mg daily. Talked about dietary modifications and exercise for weight loss.

## 2023-09-04 LAB — BASIC METABOLIC PANEL WITH GFR
BUN/Creatinine Ratio: 17 (ref 9–20)
BUN: 36 mg/dL — ABNORMAL HIGH (ref 6–24)
CO2: 21 mmol/L (ref 20–29)
Calcium: 9.3 mg/dL (ref 8.7–10.2)
Chloride: 104 mmol/L (ref 96–106)
Creatinine, Ser: 2.18 mg/dL — ABNORMAL HIGH (ref 0.76–1.27)
Glucose: 169 mg/dL — ABNORMAL HIGH (ref 70–99)
Potassium: 4.5 mmol/L (ref 3.5–5.2)
Sodium: 141 mmol/L (ref 134–144)
eGFR: 37 mL/min/1.73 — ABNORMAL LOW (ref >59)

## 2023-09-06 ENCOUNTER — Ambulatory Visit: Payer: Self-pay | Admitting: Student

## 2023-09-06 DIAGNOSIS — N183 Chronic kidney disease, stage 3 unspecified: Secondary | ICD-10-CM

## 2023-09-06 NOTE — Telephone Encounter (Signed)
 Elevated creatinine, CKD 3b range with significant proteinuria. Kidney failure risk calculator ([email or web address]) shows high risk of progression to kidney failure requiring dialysis or transplant in next 2-5 years. Seems like outpatient follow-up with nephrology was planned after his NSTEMI with AKI in 2023, I want to make sure he's still part of their clinic given these results. Called and left voicemail.  Outpatient Medications Prior to Visit  Medication Sig   aspirin  EC 81 MG tablet Take 1 tablet (81 mg total) by mouth daily. Swallow whole.   atorvastatin  (LIPITOR) 80 MG tablet Take 1 tablet (80 mg total) by mouth daily.   carvedilol  (COREG ) 25 MG tablet Take 1 tablet (25 mg total) by mouth 2 (two) times daily with a meal.   Continuous Glucose Sensor (DEXCOM G7 SENSOR) MISC Place new sensor every 10 days. Use to monitor blood sugar continuously.   empagliflozin  (JARDIANCE ) 25 MG TABS tablet Take 1 tablet (25 mg total) by mouth daily before breakfast.   ezetimibe  (ZETIA ) 10 MG tablet Take 1 tablet (10 mg total) by mouth daily.   glipiZIDE  (GLUCOTROL ) 10 MG tablet Take 1 tablet (10 mg total) by mouth daily before breakfast.   sacubitril -valsartan  (ENTRESTO ) 49-51 MG Take 1 tablet by mouth 2 (two) times daily.   tadalafil  (CIALIS ) 5 MG tablet Take 1 tablet (5 mg total) by mouth daily as needed for erectile dysfunction.   No facility-administered medications prior to visit.    Ozell Kung MD 09/06/2023, 12:04 PM

## 2023-09-07 NOTE — Progress Notes (Signed)
 Internal Medicine Clinic Attending  Case discussed with the resident at the time of the visit.  We reviewed the resident's history and exam and pertinent patient test results.  I agree with the assessment, diagnosis, and plan of care documented in the resident's note.

## 2023-09-08 NOTE — Telephone Encounter (Addendum)
 Attempted another call to discuss these lab tests and request return to clinic next week for repeat BMP. No answer, left VM at primary and spouse's contact numbers.  Orders Placed This Encounter  Procedures   Basic metabolic panel with GFR    Standing Status:   Future    Expected Date:   09/15/2023    Expiration Date:   09/07/2024    Ozell Kung MD 09/08/2023, 10:49 AM

## 2023-09-08 NOTE — Addendum Note (Signed)
 Addended by: NORRINE SHARPER on: 09/08/2023 10:51 AM   Modules accepted: Orders

## 2023-09-10 ENCOUNTER — Ambulatory Visit: Admitting: Podiatry

## 2023-09-10 DIAGNOSIS — M21962 Unspecified acquired deformity of left lower leg: Secondary | ICD-10-CM

## 2023-09-10 DIAGNOSIS — M21961 Unspecified acquired deformity of right lower leg: Secondary | ICD-10-CM

## 2023-09-10 NOTE — Progress Notes (Signed)
 Subjective:  Patient ID: Shaun Cummings, male    DOB: Sep 02, 1975,  MRN: [id number]  Chief Complaint  Patient presents with   Callouses    Pt stated that he has calluses on both feet and some numbness     48 y.o. male presents with the above complaint.  Patient presents with complaint bilateral pes cavus deformity.  Patient states that this foot deformity is leading to hyperkeratotic lesion to bilateral submet 1 and 5.  Wanted discuss treatment options he does not wear orthotics he wears [employer] brand.  Denies any other acute issues.   Review of Systems: Negative except as noted in the HPI. Denies N/V/F/Ch.  Past Medical History:  Diagnosis Date   Abdominal pain 01/07/2022   Angina pectoris (HCC) 12/17/2021   Blurred vision 02/04/2021   Chest pain    Elevated troponin 01/04/2022   HTN (hypertension)    Hyperlipidemia    Hypertensive emergency 01/04/2022   Infected epithelial inclusion cyst 03/14/2018   Leg edema, right 12/31/2021   Lesion of penis    foreskin   Non-ST elevation (NSTEMI) myocardial infarction (HCC) 01/05/2022   OSA (obstructive sleep apnea)    per pt dx osa and used cpap but after losing wt. from 415 pounds down to 244 pounds no longer needs cpap   Peripheral neuropathy    Peripheral neuropathy 05/21/2017   Phimosis    Scrotal abscess 05/08/2017   Type 2 diabetes mellitus (HCC)    per pt no meds for two years pt was changed to levemir  unable to afford but pt states is waiting on approval for a program he applied for that will pay for his meds [facility])      Current Outpatient Medications:    aspirin  EC 81 MG tablet, Take 1 tablet (81 mg total) by mouth daily. Swallow whole., Disp: 30 tablet, Rfl: 12   atorvastatin  (LIPITOR) 80 MG tablet, Take 1 tablet (80 mg total) by mouth daily., Disp: 90 tablet, Rfl: 3   carvedilol  (COREG ) 25 MG tablet, Take 1 tablet (25 mg total) by mouth 2 (two) times daily with a meal., Disp: 180 tablet, Rfl: 2   Continuous  Glucose Sensor (DEXCOM G7 SENSOR) MISC, Place new sensor every 10 days. Use to monitor blood sugar continuously., Disp: 9 each, Rfl: 3   empagliflozin  (JARDIANCE ) 25 MG TABS tablet, Take 1 tablet (25 mg total) by mouth daily before breakfast., Disp: 90 tablet, Rfl: 3   ezetimibe  (ZETIA ) 10 MG tablet, Take 1 tablet (10 mg total) by mouth daily., Disp: 90 tablet, Rfl: 3   glipiZIDE  (GLUCOTROL ) 10 MG tablet, Take 1 tablet (10 mg total) by mouth daily before breakfast., Disp: 90 tablet, Rfl: 3   sacubitril -valsartan  (ENTRESTO ) 49-51 MG, Take 1 tablet by mouth 2 (two) times daily., Disp: 180 tablet, Rfl: 3   tadalafil  (CIALIS ) 5 MG tablet, Take 1 tablet (5 mg total) by mouth daily as needed for erectile dysfunction., Disp: 10 tablet, Rfl: 0  Social History   Tobacco Use  Smoking Status Former   Current packs/day: 0.00   Types: Cigarettes   Start date: 02/04/2006   Quit date: 02/05/2008   Years since quitting: 15.6  Smokeless Tobacco Never    Allergies  Allergen Reactions   Augmentin [Amoxicillin-Pot Clavulanate] Hives and Itching    Has patient had a PCN reaction causing immediate rash, facial/tongue/throat swelling, SOB or lightheadedness with hypotension: Yes Has patient had a PCN reaction causing severe rash involving mucus membranes or skin necrosis: No  Has patient had a PCN reaction that required hospitalization: No Has patient had a PCN reaction occurring within the last 10 years: No If all of the above answers are NO, then may proceed with Cephalosporin use.   Contrast Media [Iodinated Contrast Media] Hives    Pt broke out with 1 hive on forehead after CT injection-treated with 25mg  Benadryl    Objective:  There were no vitals filed for this visit. There is no height or weight on file to calculate BMI. Constitutional Well developed. Well nourished.  Vascular Dorsalis pedis pulses palpable bilaterally. Posterior tibial pulses palpable bilaterally. Capillary refill normal to all  digits.  No cyanosis or clubbing noted. Pedal hair growth normal.  Neurologic Normal speech. Oriented to person, place, and time. Epicritic sensation to light touch grossly present bilaterally.  Dermatologic Nails well groomed and normal in appearance. No open wounds. No skin lesions.  Orthopedic: Bilateral pes cavus foot structure noted with cavovarus foot type submet 1 and 5 lesion noted.  No open wounds noted.   Radiographs: None Assessment:   1. Foot deformity, bilateral    Plan:  Patient was evaluated and treated and all questions answered.  Pes cavus -I explained to patient the etiology of pes cav/foot deformity us  and relationship with Planter fasciitis and various treatment options were discussed.  Given patient foot structure in the setting of Planter fasciitis I believe patient will benefit from custom-made orthotics to help control the hindfoot motion support the arch of the foot and take the stress away from plantar fascial.  Patient agrees with the plan like to proceed with orthotics -Patient was casted for orthotics with offloading of submet 1 and 5   No follow-ups on file.

## 2023-09-14 ENCOUNTER — Ambulatory Visit (INDEPENDENT_AMBULATORY_CARE_PROVIDER_SITE_OTHER): Payer: Self-pay | Admitting: Student

## 2023-09-14 VITALS — BP 130/86 | HR 78 | Temp 98.1°F | Ht 72.0 in | Wt 253.6 lb

## 2023-09-14 DIAGNOSIS — N183 Chronic kidney disease, stage 3 unspecified: Secondary | ICD-10-CM | POA: Diagnosis not present

## 2023-09-14 DIAGNOSIS — I10 Essential (primary) hypertension: Secondary | ICD-10-CM

## 2023-09-14 DIAGNOSIS — Z7984 Long term (current) use of oral hypoglycemic drugs: Secondary | ICD-10-CM | POA: Diagnosis not present

## 2023-09-14 DIAGNOSIS — E1122 Type 2 diabetes mellitus with diabetic chronic kidney disease: Secondary | ICD-10-CM | POA: Diagnosis not present

## 2023-09-14 DIAGNOSIS — I129 Hypertensive chronic kidney disease with stage 1 through stage 4 chronic kidney disease, or unspecified chronic kidney disease: Secondary | ICD-10-CM | POA: Diagnosis not present

## 2023-09-14 DIAGNOSIS — E1121 Type 2 diabetes mellitus with diabetic nephropathy: Secondary | ICD-10-CM | POA: Diagnosis not present

## 2023-09-14 NOTE — Progress Notes (Signed)
 Patient name: Shaun Cummings Date of birth: 06/10/1975 Date of visit: 09/14/23  Subjective   Reason for visit: Diabetes and chronic kidney disease  Suffered kidney injury when he had his heart attack a couple of years ago. Now with residual CKD, 3a-3b.  Also severe proteinuria last ACR showed 5000 mg/g.  Sees cardiology for HFrEF and history of ACS.  Recently saw podiatry for callus and at risk feet, orthotics were prescribed.  He has not seen nephrology in some time.  Current Outpatient Medications  Medication Instructions   aspirin  EC 81 mg, Oral, Daily, Swallow whole.   atorvastatin  (LIPITOR) 80 mg, Oral, Daily   carvedilol  (COREG ) 25 mg, Oral, 2 times daily with meals   Continuous Glucose Sensor (DEXCOM G7 SENSOR) MISC Place new sensor every 10 days. Use to monitor blood sugar continuously.   empagliflozin  (JARDIANCE ) 25 mg, Oral, Daily before breakfast   ezetimibe  (ZETIA ) 10 mg, Oral, Daily   glipiZIDE  (GLUCOTROL ) 10 mg, Oral, Daily before breakfast   sacubitril -valsartan  (ENTRESTO ) 49-51 MG 1 tablet, Oral, 2 times daily   tadalafil  (CIALIS ) 5 mg, Oral, Daily PRN     Objective  Today's Vitals   09/14/23 0920 09/14/23 0930  BP: (!) 145/85 130/86  Pulse: 70 78  Temp: 98.1 F (36.7 C)   TempSrc: Oral   SpO2: 94%   Weight: 253 lb 9.6 oz (115 kg)   Height: 6' (1.829 m)   Body mass index is 34.39 kg/m.   Physical Exam Constitutional:      Appearance: Normal appearance.  Cardiovascular:     Rate and Rhythm: Normal rate and regular rhythm.  Pulmonary:     Effort: Pulmonary effort is normal. No respiratory distress.  Skin:    General: Skin is warm and dry.  Neurological:     Mental Status: He is alert.     Cranial Nerves: No facial asymmetry.  Psychiatric:        Mood and Affect: Affect normal.        Speech: Speech normal.        Behavior: Behavior normal.      Assessment & Plan   Stage 3 chronic kidney disease, unspecified whether stage 3a or 3b CKD  (HCC) Assessment & Plan: Chronic, stable.  With severe proteinuria.  At high risk for advanced CKD and ESRD requiring dialysis in the next 2 to 5 years.  Recommend aggressive glycemic control and blood pressure control for this person.  Requested nephrology referral for an appointment.  Orders: -     Basic metabolic panel with GFR -     Ambulatory referral to Nephrology  Type 2 diabetes with nephropathy [facility]) Assessment & Plan: Chronic, poor control.  Lab Results  Component Value Date   HGBA1C 9.8 (A) 09/03/2023   HGBA1C 12.2 (A) 03/05/2023   HGBA1C 9.9 (H) 01/06/2022   Continue Jardiance  and glipizide .  Cannot tolerate GLP-1 due to side effects.  Follow-up in 3 months, if A1c not improving I would start insulin  for more intensive glycemic control given the degree of his diabetic nephropathy.   Primary hypertension Assessment & Plan: Chronic, improved since reinitiating therapy at last visit.  BP Readings from Last 3 Encounters:  09/14/23 130/86  09/03/23 (!) 159/102  03/17/23 125/83   120/80 would be a better target given degree of his nephropathy and for secondary prevention of ASCVD.     Return in about 3 months (around 12/15/2023) for diabetes follow-up.  Ozell Kung MD 09/14/2023, 2:24 PM

## 2023-09-14 NOTE — Assessment & Plan Note (Signed)
 Chronic, stable.  With severe proteinuria.  At high risk for advanced CKD and ESRD requiring dialysis in the next 2 to 5 years.  Recommend aggressive glycemic control and blood pressure control for this person.  Requested nephrology referral for an appointment.

## 2023-09-14 NOTE — Assessment & Plan Note (Signed)
 Chronic, poor control.  Lab Results  Component Value Date   HGBA1C 9.8 (A) 09/03/2023   HGBA1C 12.2 (A) 03/05/2023   HGBA1C 9.9 (H) 01/06/2022   Continue Jardiance  and glipizide .  Cannot tolerate GLP-1 due to side effects.  Follow-up in 3 months, if A1c not improving I would start insulin  for more intensive glycemic control given the degree of his diabetic nephropathy.

## 2023-09-14 NOTE — Assessment & Plan Note (Signed)
 Chronic, improved since reinitiating therapy at last visit.  BP Readings from Last 3 Encounters:  09/14/23 130/86  09/03/23 (!) 159/102  03/17/23 125/83   120/80 would be a better target given degree of his nephropathy and for secondary prevention of ASCVD.

## 2023-09-14 NOTE — Patient Instructions (Signed)
 Remember to bring all of the medications that you take (including over the counter medications and supplements) with you to every clinic visit.  This after visit summary is an important review of tests, referrals, and medication changes that were discussed during your visit. If you have questions or concerns, call [phone]. Outside of clinic business hours, call the main hospital at [phone] and ask the operator for the on-call internal medicine resident.   Ozell Kung MD 09/14/2023, 10:17 AM

## 2023-09-15 ENCOUNTER — Ambulatory Visit: Payer: Self-pay | Admitting: Student

## 2023-09-15 LAB — BASIC METABOLIC PANEL WITH GFR
BUN/Creatinine Ratio: 13 (ref 9–20)
BUN: 28 mg/dL — ABNORMAL HIGH (ref 6–24)
CO2: 19 mmol/L — ABNORMAL LOW (ref 20–29)
Calcium: 8.5 mg/dL — ABNORMAL LOW (ref 8.7–10.2)
Chloride: 104 mmol/L (ref 96–106)
Creatinine, Ser: 2.22 mg/dL — ABNORMAL HIGH (ref 0.76–1.27)
Glucose: 229 mg/dL — ABNORMAL HIGH (ref 70–99)
Potassium: 4.5 mmol/L (ref 3.5–5.2)
Sodium: 139 mmol/L (ref 134–144)
eGFR: 36 mL/min/1.73 — ABNORMAL LOW (ref >59)

## 2023-09-19 NOTE — Progress Notes (Signed)
 Internal Medicine Clinic Attending  Case discussed with the resident at the time of the visit.  We reviewed the resident's history and exam and pertinent patient test results.  I agree with the assessment, diagnosis, and plan of care documented in the resident's note.

## 2023-10-18 ENCOUNTER — Ambulatory Visit

## 2023-10-19 NOTE — Progress Notes (Signed)
 Discounted orthotics for patient to half off as he has no coverage for L3020 he was never told of possible oop amount of $542 Lolita Schultze CPed, CFo, CFm

## 2023-10-19 NOTE — Progress Notes (Signed)
 I removed one orthotic as patient does not have coverage for L3020 This will give him half off discount   Patient was not notified of$542 amount   Lolita Schultze Cped,CFo, CFm

## 2023-12-27 DIAGNOSIS — E113513 Type 2 diabetes mellitus with proliferative diabetic retinopathy with macular edema, bilateral: Secondary | ICD-10-CM | POA: Diagnosis not present

## 2023-12-27 DIAGNOSIS — H2513 Age-related nuclear cataract, bilateral: Secondary | ICD-10-CM | POA: Diagnosis not present

## 2023-12-27 DIAGNOSIS — H35033 Hypertensive retinopathy, bilateral: Secondary | ICD-10-CM | POA: Diagnosis not present

## 2023-12-27 DIAGNOSIS — H4311 Vitreous hemorrhage, right eye: Secondary | ICD-10-CM | POA: Diagnosis not present

## 2024-01-04 DIAGNOSIS — H3341 Traction detachment of retina, right eye: Secondary | ICD-10-CM | POA: Diagnosis not present
